# Patient Record
Sex: Male | Born: 1935 | Race: White | Hispanic: No | Marital: Married | Smoking: Never smoker
Health system: Southern US, Community
[De-identification: ages and names within clinical notes are randomized; demographics above are authoritative.]

## PROBLEM LIST (undated history)

## (undated) DIAGNOSIS — I4891 Unspecified atrial fibrillation: Secondary | ICD-10-CM

## (undated) DIAGNOSIS — I639 Cerebral infarction, unspecified: Secondary | ICD-10-CM

## (undated) DIAGNOSIS — E079 Disorder of thyroid, unspecified: Secondary | ICD-10-CM

## (undated) DIAGNOSIS — I10 Essential (primary) hypertension: Secondary | ICD-10-CM

## (undated) DIAGNOSIS — Z4509 Encounter for adjustment and management of other cardiac device: Secondary | ICD-10-CM

## (undated) DIAGNOSIS — Z95818 Presence of other cardiac implants and grafts: Secondary | ICD-10-CM

## (undated) DIAGNOSIS — Z8673 Personal history of transient ischemic attack (TIA), and cerebral infarction without residual deficits: Secondary | ICD-10-CM

## (undated) DIAGNOSIS — E119 Type 2 diabetes mellitus without complications: Secondary | ICD-10-CM

## (undated) DIAGNOSIS — E785 Hyperlipidemia, unspecified: Secondary | ICD-10-CM

## (undated) DIAGNOSIS — E039 Hypothyroidism, unspecified: Secondary | ICD-10-CM

## (undated) DIAGNOSIS — C801 Malignant (primary) neoplasm, unspecified: Secondary | ICD-10-CM

## (undated) HISTORY — PX: LOOP RECORDER IMPLANT: SHX5954

## (undated) HISTORY — DX: Unspecified atrial fibrillation: I48.91

## (undated) HISTORY — PX: BACK SURGERY: SHX140

## (undated) HISTORY — DX: Cerebral infarction, unspecified: I63.9

---
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
## 2002-05-01 ENCOUNTER — Ambulatory Visit: Admission: RE | Admit: 2002-05-01 | Discharge: 2002-05-11 | Payer: Self-pay | Admitting: Radiation Oncology

## 2002-07-04 ENCOUNTER — Ambulatory Visit: Admission: RE | Admit: 2002-07-04 | Discharge: 2002-10-02 | Payer: Self-pay | Admitting: Radiation Oncology

## 2002-11-08 ENCOUNTER — Ambulatory Visit: Admission: RE | Admit: 2002-11-08 | Discharge: 2002-11-08 | Payer: Self-pay | Admitting: Radiation Oncology

## 2011-02-23 DIAGNOSIS — R946 Abnormal results of thyroid function studies: Secondary | ICD-10-CM | POA: Diagnosis not present

## 2011-04-13 DIAGNOSIS — E119 Type 2 diabetes mellitus without complications: Secondary | ICD-10-CM | POA: Diagnosis not present

## 2011-04-13 DIAGNOSIS — E785 Hyperlipidemia, unspecified: Secondary | ICD-10-CM | POA: Diagnosis not present

## 2011-04-13 DIAGNOSIS — E038 Other specified hypothyroidism: Secondary | ICD-10-CM | POA: Diagnosis not present

## 2011-04-13 DIAGNOSIS — I10 Essential (primary) hypertension: Secondary | ICD-10-CM | POA: Diagnosis not present

## 2011-04-13 DIAGNOSIS — E663 Overweight: Secondary | ICD-10-CM | POA: Diagnosis not present

## 2011-04-16 DIAGNOSIS — Z8546 Personal history of malignant neoplasm of prostate: Secondary | ICD-10-CM | POA: Diagnosis not present

## 2011-04-16 DIAGNOSIS — C61 Malignant neoplasm of prostate: Secondary | ICD-10-CM | POA: Diagnosis not present

## 2011-10-19 DIAGNOSIS — E663 Overweight: Secondary | ICD-10-CM | POA: Diagnosis not present

## 2011-10-19 DIAGNOSIS — E038 Other specified hypothyroidism: Secondary | ICD-10-CM | POA: Diagnosis not present

## 2011-10-19 DIAGNOSIS — I10 Essential (primary) hypertension: Secondary | ICD-10-CM | POA: Diagnosis not present

## 2011-10-19 DIAGNOSIS — H9190 Unspecified hearing loss, unspecified ear: Secondary | ICD-10-CM | POA: Diagnosis not present

## 2011-10-19 DIAGNOSIS — E785 Hyperlipidemia, unspecified: Secondary | ICD-10-CM | POA: Diagnosis not present

## 2011-10-19 DIAGNOSIS — E119 Type 2 diabetes mellitus without complications: Secondary | ICD-10-CM | POA: Diagnosis not present

## 2011-12-09 DIAGNOSIS — H5210 Myopia, unspecified eye: Secondary | ICD-10-CM | POA: Diagnosis not present

## 2011-12-09 DIAGNOSIS — H2510 Age-related nuclear cataract, unspecified eye: Secondary | ICD-10-CM | POA: Diagnosis not present

## 2011-12-09 DIAGNOSIS — H43819 Vitreous degeneration, unspecified eye: Secondary | ICD-10-CM | POA: Diagnosis not present

## 2011-12-09 DIAGNOSIS — IMO0001 Reserved for inherently not codable concepts without codable children: Secondary | ICD-10-CM | POA: Diagnosis not present

## 2011-12-22 DIAGNOSIS — H903 Sensorineural hearing loss, bilateral: Secondary | ICD-10-CM | POA: Diagnosis not present

## 2012-01-24 DIAGNOSIS — E785 Hyperlipidemia, unspecified: Secondary | ICD-10-CM | POA: Diagnosis not present

## 2012-01-24 DIAGNOSIS — E119 Type 2 diabetes mellitus without complications: Secondary | ICD-10-CM | POA: Diagnosis not present

## 2012-01-24 DIAGNOSIS — E038 Other specified hypothyroidism: Secondary | ICD-10-CM | POA: Diagnosis not present

## 2012-01-25 DIAGNOSIS — E038 Other specified hypothyroidism: Secondary | ICD-10-CM | POA: Diagnosis not present

## 2012-01-25 DIAGNOSIS — E663 Overweight: Secondary | ICD-10-CM | POA: Diagnosis not present

## 2012-01-25 DIAGNOSIS — E119 Type 2 diabetes mellitus without complications: Secondary | ICD-10-CM | POA: Diagnosis not present

## 2012-01-25 DIAGNOSIS — E785 Hyperlipidemia, unspecified: Secondary | ICD-10-CM | POA: Diagnosis not present

## 2012-01-25 DIAGNOSIS — I10 Essential (primary) hypertension: Secondary | ICD-10-CM | POA: Diagnosis not present

## 2012-05-02 DIAGNOSIS — C61 Malignant neoplasm of prostate: Secondary | ICD-10-CM | POA: Diagnosis not present

## 2012-08-15 DIAGNOSIS — E038 Other specified hypothyroidism: Secondary | ICD-10-CM | POA: Diagnosis not present

## 2012-08-15 DIAGNOSIS — E119 Type 2 diabetes mellitus without complications: Secondary | ICD-10-CM | POA: Diagnosis not present

## 2012-08-15 DIAGNOSIS — E785 Hyperlipidemia, unspecified: Secondary | ICD-10-CM | POA: Diagnosis not present

## 2012-08-15 DIAGNOSIS — I10 Essential (primary) hypertension: Secondary | ICD-10-CM | POA: Diagnosis not present

## 2012-08-15 DIAGNOSIS — E663 Overweight: Secondary | ICD-10-CM | POA: Diagnosis not present

## 2012-12-25 DIAGNOSIS — Z23 Encounter for immunization: Secondary | ICD-10-CM | POA: Diagnosis not present

## 2013-01-02 DIAGNOSIS — H2510 Age-related nuclear cataract, unspecified eye: Secondary | ICD-10-CM | POA: Diagnosis not present

## 2013-01-02 DIAGNOSIS — IMO0001 Reserved for inherently not codable concepts without codable children: Secondary | ICD-10-CM | POA: Diagnosis not present

## 2013-01-02 DIAGNOSIS — H43819 Vitreous degeneration, unspecified eye: Secondary | ICD-10-CM | POA: Diagnosis not present

## 2013-02-27 DIAGNOSIS — R7989 Other specified abnormal findings of blood chemistry: Secondary | ICD-10-CM | POA: Diagnosis not present

## 2013-02-27 DIAGNOSIS — E038 Other specified hypothyroidism: Secondary | ICD-10-CM | POA: Diagnosis not present

## 2013-02-27 DIAGNOSIS — E119 Type 2 diabetes mellitus without complications: Secondary | ICD-10-CM | POA: Diagnosis not present

## 2013-03-01 DIAGNOSIS — E119 Type 2 diabetes mellitus without complications: Secondary | ICD-10-CM | POA: Diagnosis not present

## 2013-03-01 DIAGNOSIS — E663 Overweight: Secondary | ICD-10-CM | POA: Diagnosis not present

## 2013-03-01 DIAGNOSIS — E038 Other specified hypothyroidism: Secondary | ICD-10-CM | POA: Diagnosis not present

## 2013-03-01 DIAGNOSIS — I10 Essential (primary) hypertension: Secondary | ICD-10-CM | POA: Diagnosis not present

## 2013-03-01 DIAGNOSIS — E785 Hyperlipidemia, unspecified: Secondary | ICD-10-CM | POA: Diagnosis not present

## 2013-03-06 DIAGNOSIS — H02839 Dermatochalasis of unspecified eye, unspecified eyelid: Secondary | ICD-10-CM | POA: Diagnosis not present

## 2013-03-06 DIAGNOSIS — H25049 Posterior subcapsular polar age-related cataract, unspecified eye: Secondary | ICD-10-CM | POA: Diagnosis not present

## 2013-03-06 DIAGNOSIS — H35379 Puckering of macula, unspecified eye: Secondary | ICD-10-CM | POA: Diagnosis not present

## 2013-03-06 DIAGNOSIS — H2510 Age-related nuclear cataract, unspecified eye: Secondary | ICD-10-CM | POA: Diagnosis not present

## 2013-03-06 DIAGNOSIS — E119 Type 2 diabetes mellitus without complications: Secondary | ICD-10-CM | POA: Diagnosis not present

## 2013-03-06 DIAGNOSIS — H25019 Cortical age-related cataract, unspecified eye: Secondary | ICD-10-CM | POA: Diagnosis not present

## 2013-04-12 DIAGNOSIS — E038 Other specified hypothyroidism: Secondary | ICD-10-CM | POA: Diagnosis not present

## 2013-05-14 DIAGNOSIS — Z961 Presence of intraocular lens: Secondary | ICD-10-CM | POA: Diagnosis not present

## 2013-05-14 DIAGNOSIS — H2510 Age-related nuclear cataract, unspecified eye: Secondary | ICD-10-CM | POA: Diagnosis not present

## 2013-05-14 DIAGNOSIS — H269 Unspecified cataract: Secondary | ICD-10-CM | POA: Diagnosis not present

## 2013-05-15 DIAGNOSIS — H2510 Age-related nuclear cataract, unspecified eye: Secondary | ICD-10-CM | POA: Diagnosis not present

## 2013-05-28 DIAGNOSIS — H269 Unspecified cataract: Secondary | ICD-10-CM | POA: Diagnosis not present

## 2013-05-28 DIAGNOSIS — H2510 Age-related nuclear cataract, unspecified eye: Secondary | ICD-10-CM | POA: Diagnosis not present

## 2013-05-28 DIAGNOSIS — Z961 Presence of intraocular lens: Secondary | ICD-10-CM | POA: Diagnosis not present

## 2013-06-11 DIAGNOSIS — E038 Other specified hypothyroidism: Secondary | ICD-10-CM | POA: Diagnosis not present

## 2013-08-29 DIAGNOSIS — E038 Other specified hypothyroidism: Secondary | ICD-10-CM | POA: Diagnosis not present

## 2013-08-29 DIAGNOSIS — E119 Type 2 diabetes mellitus without complications: Secondary | ICD-10-CM | POA: Diagnosis not present

## 2013-08-31 DIAGNOSIS — E119 Type 2 diabetes mellitus without complications: Secondary | ICD-10-CM | POA: Diagnosis not present

## 2013-08-31 DIAGNOSIS — Z23 Encounter for immunization: Secondary | ICD-10-CM | POA: Diagnosis not present

## 2013-08-31 DIAGNOSIS — E785 Hyperlipidemia, unspecified: Secondary | ICD-10-CM | POA: Diagnosis not present

## 2013-08-31 DIAGNOSIS — E663 Overweight: Secondary | ICD-10-CM | POA: Diagnosis not present

## 2013-08-31 DIAGNOSIS — Z6826 Body mass index (BMI) 26.0-26.9, adult: Secondary | ICD-10-CM | POA: Diagnosis not present

## 2013-08-31 DIAGNOSIS — E038 Other specified hypothyroidism: Secondary | ICD-10-CM | POA: Diagnosis not present

## 2013-08-31 DIAGNOSIS — I10 Essential (primary) hypertension: Secondary | ICD-10-CM | POA: Diagnosis not present

## 2013-12-06 DIAGNOSIS — Z23 Encounter for immunization: Secondary | ICD-10-CM | POA: Diagnosis not present

## 2013-12-31 DIAGNOSIS — E119 Type 2 diabetes mellitus without complications: Secondary | ICD-10-CM | POA: Diagnosis not present

## 2013-12-31 DIAGNOSIS — Z961 Presence of intraocular lens: Secondary | ICD-10-CM | POA: Diagnosis not present

## 2014-03-06 DIAGNOSIS — Z6826 Body mass index (BMI) 26.0-26.9, adult: Secondary | ICD-10-CM | POA: Diagnosis not present

## 2014-03-06 DIAGNOSIS — E663 Overweight: Secondary | ICD-10-CM | POA: Diagnosis not present

## 2014-03-06 DIAGNOSIS — E1142 Type 2 diabetes mellitus with diabetic polyneuropathy: Secondary | ICD-10-CM | POA: Diagnosis not present

## 2014-03-06 DIAGNOSIS — E039 Hypothyroidism, unspecified: Secondary | ICD-10-CM | POA: Diagnosis not present

## 2014-09-11 DIAGNOSIS — E039 Hypothyroidism, unspecified: Secondary | ICD-10-CM | POA: Diagnosis not present

## 2014-09-11 DIAGNOSIS — E1142 Type 2 diabetes mellitus with diabetic polyneuropathy: Secondary | ICD-10-CM | POA: Diagnosis not present

## 2014-10-23 DIAGNOSIS — E1142 Type 2 diabetes mellitus with diabetic polyneuropathy: Secondary | ICD-10-CM | POA: Diagnosis not present

## 2014-10-23 DIAGNOSIS — E039 Hypothyroidism, unspecified: Secondary | ICD-10-CM | POA: Diagnosis not present

## 2014-12-20 DIAGNOSIS — Z23 Encounter for immunization: Secondary | ICD-10-CM | POA: Diagnosis not present

## 2015-01-02 DIAGNOSIS — E119 Type 2 diabetes mellitus without complications: Secondary | ICD-10-CM | POA: Diagnosis not present

## 2015-01-02 DIAGNOSIS — Z961 Presence of intraocular lens: Secondary | ICD-10-CM | POA: Diagnosis not present

## 2015-01-02 DIAGNOSIS — Z7984 Long term (current) use of oral hypoglycemic drugs: Secondary | ICD-10-CM | POA: Diagnosis not present

## 2015-03-13 DIAGNOSIS — E039 Hypothyroidism, unspecified: Secondary | ICD-10-CM | POA: Diagnosis not present

## 2015-03-13 DIAGNOSIS — Z7984 Long term (current) use of oral hypoglycemic drugs: Secondary | ICD-10-CM | POA: Diagnosis not present

## 2015-03-13 DIAGNOSIS — E785 Hyperlipidemia, unspecified: Secondary | ICD-10-CM | POA: Diagnosis not present

## 2015-03-13 DIAGNOSIS — E1142 Type 2 diabetes mellitus with diabetic polyneuropathy: Secondary | ICD-10-CM | POA: Diagnosis not present

## 2015-03-13 DIAGNOSIS — I10 Essential (primary) hypertension: Secondary | ICD-10-CM | POA: Diagnosis not present

## 2015-09-10 DIAGNOSIS — Z7984 Long term (current) use of oral hypoglycemic drugs: Secondary | ICD-10-CM | POA: Diagnosis not present

## 2015-09-10 DIAGNOSIS — E039 Hypothyroidism, unspecified: Secondary | ICD-10-CM | POA: Diagnosis not present

## 2015-09-10 DIAGNOSIS — E785 Hyperlipidemia, unspecified: Secondary | ICD-10-CM | POA: Diagnosis not present

## 2015-09-10 DIAGNOSIS — I10 Essential (primary) hypertension: Secondary | ICD-10-CM | POA: Diagnosis not present

## 2015-09-10 DIAGNOSIS — E1142 Type 2 diabetes mellitus with diabetic polyneuropathy: Secondary | ICD-10-CM | POA: Diagnosis not present

## 2015-10-02 DIAGNOSIS — Z23 Encounter for immunization: Secondary | ICD-10-CM | POA: Diagnosis not present

## 2016-01-06 DIAGNOSIS — H35373 Puckering of macula, bilateral: Secondary | ICD-10-CM | POA: Diagnosis not present

## 2016-01-06 DIAGNOSIS — E119 Type 2 diabetes mellitus without complications: Secondary | ICD-10-CM | POA: Diagnosis not present

## 2016-03-15 DIAGNOSIS — E039 Hypothyroidism, unspecified: Secondary | ICD-10-CM | POA: Diagnosis not present

## 2016-03-15 DIAGNOSIS — E785 Hyperlipidemia, unspecified: Secondary | ICD-10-CM | POA: Diagnosis not present

## 2016-03-15 DIAGNOSIS — I10 Essential (primary) hypertension: Secondary | ICD-10-CM | POA: Diagnosis not present

## 2016-03-15 DIAGNOSIS — E1142 Type 2 diabetes mellitus with diabetic polyneuropathy: Secondary | ICD-10-CM | POA: Diagnosis not present

## 2016-03-15 DIAGNOSIS — Z7984 Long term (current) use of oral hypoglycemic drugs: Secondary | ICD-10-CM | POA: Diagnosis not present

## 2016-09-13 DIAGNOSIS — E785 Hyperlipidemia, unspecified: Secondary | ICD-10-CM | POA: Diagnosis not present

## 2016-09-13 DIAGNOSIS — I10 Essential (primary) hypertension: Secondary | ICD-10-CM | POA: Diagnosis not present

## 2016-09-13 DIAGNOSIS — E039 Hypothyroidism, unspecified: Secondary | ICD-10-CM | POA: Diagnosis not present

## 2016-09-13 DIAGNOSIS — E1142 Type 2 diabetes mellitus with diabetic polyneuropathy: Secondary | ICD-10-CM | POA: Diagnosis not present

## 2016-09-15 DIAGNOSIS — Z23 Encounter for immunization: Secondary | ICD-10-CM | POA: Diagnosis not present

## 2017-03-17 DIAGNOSIS — E039 Hypothyroidism, unspecified: Secondary | ICD-10-CM | POA: Diagnosis not present

## 2017-03-17 DIAGNOSIS — I10 Essential (primary) hypertension: Secondary | ICD-10-CM | POA: Diagnosis not present

## 2017-03-17 DIAGNOSIS — E1142 Type 2 diabetes mellitus with diabetic polyneuropathy: Secondary | ICD-10-CM | POA: Diagnosis not present

## 2017-03-17 DIAGNOSIS — E785 Hyperlipidemia, unspecified: Secondary | ICD-10-CM | POA: Diagnosis not present

## 2017-03-30 DIAGNOSIS — E119 Type 2 diabetes mellitus without complications: Secondary | ICD-10-CM | POA: Diagnosis not present

## 2017-03-30 DIAGNOSIS — H35373 Puckering of macula, bilateral: Secondary | ICD-10-CM | POA: Diagnosis not present

## 2017-03-30 DIAGNOSIS — H16223 Keratoconjunctivitis sicca, not specified as Sjogren's, bilateral: Secondary | ICD-10-CM | POA: Diagnosis not present

## 2017-05-04 DIAGNOSIS — E039 Hypothyroidism, unspecified: Secondary | ICD-10-CM | POA: Diagnosis not present

## 2017-07-06 DIAGNOSIS — E039 Hypothyroidism, unspecified: Secondary | ICD-10-CM | POA: Diagnosis not present

## 2017-09-15 DIAGNOSIS — E1142 Type 2 diabetes mellitus with diabetic polyneuropathy: Secondary | ICD-10-CM | POA: Diagnosis not present

## 2017-09-15 DIAGNOSIS — E785 Hyperlipidemia, unspecified: Secondary | ICD-10-CM | POA: Diagnosis not present

## 2017-09-15 DIAGNOSIS — E039 Hypothyroidism, unspecified: Secondary | ICD-10-CM | POA: Diagnosis not present

## 2017-09-15 DIAGNOSIS — R5383 Other fatigue: Secondary | ICD-10-CM | POA: Diagnosis not present

## 2017-09-15 DIAGNOSIS — I10 Essential (primary) hypertension: Secondary | ICD-10-CM | POA: Diagnosis not present

## 2017-09-15 DIAGNOSIS — Z5181 Encounter for therapeutic drug level monitoring: Secondary | ICD-10-CM | POA: Diagnosis not present

## 2017-09-15 DIAGNOSIS — Z79899 Other long term (current) drug therapy: Secondary | ICD-10-CM | POA: Diagnosis not present

## 2017-09-15 DIAGNOSIS — D7589 Other specified diseases of blood and blood-forming organs: Secondary | ICD-10-CM | POA: Diagnosis not present

## 2017-09-26 DIAGNOSIS — Z23 Encounter for immunization: Secondary | ICD-10-CM | POA: Diagnosis not present

## 2017-10-31 DIAGNOSIS — D7589 Other specified diseases of blood and blood-forming organs: Secondary | ICD-10-CM | POA: Diagnosis not present

## 2017-10-31 DIAGNOSIS — Z79899 Other long term (current) drug therapy: Secondary | ICD-10-CM | POA: Diagnosis not present

## 2018-03-23 DIAGNOSIS — E1142 Type 2 diabetes mellitus with diabetic polyneuropathy: Secondary | ICD-10-CM | POA: Diagnosis not present

## 2018-03-23 DIAGNOSIS — Z5181 Encounter for therapeutic drug level monitoring: Secondary | ICD-10-CM | POA: Diagnosis not present

## 2018-03-23 DIAGNOSIS — E785 Hyperlipidemia, unspecified: Secondary | ICD-10-CM | POA: Diagnosis not present

## 2018-03-23 DIAGNOSIS — E039 Hypothyroidism, unspecified: Secondary | ICD-10-CM | POA: Diagnosis not present

## 2018-03-23 DIAGNOSIS — I10 Essential (primary) hypertension: Secondary | ICD-10-CM | POA: Diagnosis not present

## 2018-03-23 DIAGNOSIS — E1165 Type 2 diabetes mellitus with hyperglycemia: Secondary | ICD-10-CM | POA: Diagnosis not present

## 2018-04-13 ENCOUNTER — Emergency Department (HOSPITAL_COMMUNITY): Payer: Medicare Other

## 2018-04-13 ENCOUNTER — Encounter (HOSPITAL_COMMUNITY): Payer: Self-pay

## 2018-04-13 ENCOUNTER — Inpatient Hospital Stay (HOSPITAL_COMMUNITY)
Admission: EM | Admit: 2018-04-13 | Discharge: 2018-04-15 | DRG: 065 | Disposition: A | Discharge status: Home Health | Payer: Medicare Other | Attending: Internal Medicine | Admitting: Internal Medicine

## 2018-04-13 DIAGNOSIS — H5509 Other forms of nystagmus: Secondary | ICD-10-CM | POA: Diagnosis not present

## 2018-04-13 DIAGNOSIS — I6300 Cerebral infarction due to thrombosis of unspecified precerebral artery: Secondary | ICD-10-CM

## 2018-04-13 DIAGNOSIS — I639 Cerebral infarction, unspecified: Secondary | ICD-10-CM | POA: Diagnosis not present

## 2018-04-13 DIAGNOSIS — R296 Repeated falls: Secondary | ICD-10-CM | POA: Diagnosis not present

## 2018-04-13 DIAGNOSIS — R4182 Altered mental status, unspecified: Secondary | ICD-10-CM | POA: Diagnosis not present

## 2018-04-13 DIAGNOSIS — E039 Hypothyroidism, unspecified: Secondary | ICD-10-CM | POA: Diagnosis not present

## 2018-04-13 DIAGNOSIS — Z79899 Other long term (current) drug therapy: Secondary | ICD-10-CM | POA: Diagnosis not present

## 2018-04-13 DIAGNOSIS — M109 Gout, unspecified: Secondary | ICD-10-CM | POA: Diagnosis not present

## 2018-04-13 DIAGNOSIS — D649 Anemia, unspecified: Secondary | ICD-10-CM | POA: Diagnosis not present

## 2018-04-13 DIAGNOSIS — R402142 Coma scale, eyes open, spontaneous, at arrival to emergency department: Secondary | ICD-10-CM | POA: Diagnosis present

## 2018-04-13 DIAGNOSIS — I10 Essential (primary) hypertension: Secondary | ICD-10-CM | POA: Diagnosis not present

## 2018-04-13 DIAGNOSIS — Z79890 Hormone replacement therapy (postmenopausal): Secondary | ICD-10-CM | POA: Diagnosis not present

## 2018-04-13 DIAGNOSIS — R29700 NIHSS score 0: Secondary | ICD-10-CM | POA: Diagnosis not present

## 2018-04-13 DIAGNOSIS — I6340 Cerebral infarction due to embolism of unspecified cerebral artery: Secondary | ICD-10-CM

## 2018-04-13 DIAGNOSIS — R000 Tachycardia, unspecified: Secondary | ICD-10-CM | POA: Diagnosis not present

## 2018-04-13 DIAGNOSIS — R402362 Coma scale, best motor response, obeys commands, at arrival to emergency department: Secondary | ICD-10-CM | POA: Diagnosis present

## 2018-04-13 DIAGNOSIS — Z8673 Personal history of transient ischemic attack (TIA), and cerebral infarction without residual deficits: Secondary | ICD-10-CM | POA: Diagnosis present

## 2018-04-13 DIAGNOSIS — R233 Spontaneous ecchymoses: Secondary | ICD-10-CM | POA: Diagnosis not present

## 2018-04-13 DIAGNOSIS — E1151 Type 2 diabetes mellitus with diabetic peripheral angiopathy without gangrene: Secondary | ICD-10-CM | POA: Diagnosis not present

## 2018-04-13 DIAGNOSIS — Z880 Allergy status to penicillin: Secondary | ICD-10-CM

## 2018-04-13 DIAGNOSIS — E119 Type 2 diabetes mellitus without complications: Secondary | ICD-10-CM

## 2018-04-13 DIAGNOSIS — R42 Dizziness and giddiness: Secondary | ICD-10-CM | POA: Diagnosis not present

## 2018-04-13 DIAGNOSIS — K921 Melena: Secondary | ICD-10-CM | POA: Diagnosis present

## 2018-04-13 DIAGNOSIS — R402252 Coma scale, best verbal response, oriented, at arrival to emergency department: Secondary | ICD-10-CM | POA: Diagnosis not present

## 2018-04-13 DIAGNOSIS — I6339 Cerebral infarction due to thrombosis of other cerebral artery: Secondary | ICD-10-CM | POA: Diagnosis not present

## 2018-04-13 HISTORY — DX: Type 2 diabetes mellitus without complications: E11.9

## 2018-04-13 HISTORY — DX: Disorder of thyroid, unspecified: E07.9

## 2018-04-13 HISTORY — DX: Malignant (primary) neoplasm, unspecified: C80.1

## 2018-04-13 HISTORY — DX: Essential (primary) hypertension: I10

## 2018-04-13 LAB — COMPREHENSIVE METABOLIC PANEL
ALT: 22 U/L (ref 0–44)
AST: 29 U/L (ref 15–41)
Albumin: 3.8 g/dL (ref 3.5–5.0)
Alkaline Phosphatase: 43 U/L (ref 38–126)
Anion gap: 8 (ref 5–15)
BUN: 23 mg/dL (ref 8–23)
CO2: 22 mmol/L (ref 22–32)
Calcium: 8.9 mg/dL (ref 8.9–10.3)
Chloride: 107 mmol/L (ref 98–111)
Creatinine, Ser: 0.9 mg/dL (ref 0.61–1.24)
GFR calc Af Amer: >60 mL/min (ref >60)
GFR calc non Af Amer: >60 mL/min (ref >60)
Glucose, Bld: 170 mg/dL — ABNORMAL HIGH (ref 70–99)
Potassium: 3.9 mmol/L (ref 3.5–5.1)
Sodium: 137 mmol/L (ref 135–145)
Total Bilirubin: 0.6 mg/dL (ref 0.3–1.2)
Total Protein: 6.5 g/dL (ref 6.5–8.1)

## 2018-04-13 LAB — CBG MONITORING, ED: Glucose-Capillary: 177 mg/dL — ABNORMAL HIGH (ref 70–99)

## 2018-04-13 LAB — URINALYSIS, ROUTINE W REFLEX MICROSCOPIC
Bilirubin Urine: NEGATIVE
Glucose, UA: 50 mg/dL — AB
Hgb urine dipstick: NEGATIVE
Ketones, ur: NEGATIVE mg/dL
Leukocytes,Ua: NEGATIVE
Nitrite: NEGATIVE
PH: 7 (ref 5.0–8.0)
Protein, ur: NEGATIVE mg/dL
Specific Gravity, Urine: 1.019 (ref 1.005–1.030)

## 2018-04-13 LAB — CBC
HEMATOCRIT: 36.4 % — AB (ref 39.0–52.0)
Hemoglobin: 12.2 g/dL — ABNORMAL LOW (ref 13.0–17.0)
MCH: 31.9 pg (ref 26.0–34.0)
MCHC: 33.5 g/dL (ref 30.0–36.0)
MCV: 95 fL (ref 80.0–100.0)
Platelets: 182 10*3/uL (ref 150–400)
RBC: 3.83 MIL/uL — ABNORMAL LOW (ref 4.22–5.81)
RDW: 12.7 % (ref 11.5–15.5)
WBC: 6.5 10*3/uL (ref 4.0–10.5)
nRBC: 0 % (ref 0.0–0.2)

## 2018-04-13 MED ORDER — SODIUM CHLORIDE 0.9% FLUSH: 3.0000 mL | Freq: Once | INTRAVENOUS | Status: DC

## 2018-04-13 NOTE — ED Notes (Signed)
Bed: DP32 Expected date:  Expected time:  Means of arrival:  Comments: EMS 86yo dizziness x1 week, fall, orthostatic

## 2018-04-13 NOTE — H&P (Signed)
History and Physical    Alan Rubio CBJ:[id number] DOB: [birth year] DOA: 04/13/2018  PCP: [name], MD Patient coming from: Home  Chief Complaint: Dizziness, double vision, ataxia  HPI: Alan Rubio is a 85 y.o. male with medical history significant of type 2 diabetes, hypertension, hypothyroidism presenting to the hospital with a 4-day history of dizziness, double vision, and ataxia.  States he has double vision when looking with both eyes or just his right eye.  When looking with his left eye only he does not have double vision.  States when trying to walk he is falling to his right side.  Wife at bedside is concerned that patient has had 2 recent falls.  He did not hit his head or sustain any injuries from the falls.  His symptoms have been persistent.  Denies any history of prior stroke.  He does not smoke cigarettes.  States he was previously taking a baby aspirin daily but quit taking it 3 weeks ago as it was causing him to have diarrhea and dark stools.  His dark stools resolved after he stopped aspirin.  Denies any melena or hematochezia at present.  States his cholesterol has always been low on labs checked previously.  Review of Systems: As per HPI otherwise 10 point review of systems negative.  Past Medical History:  Diagnosis Date  . Cancer ([place])   . Diabetes mellitus without complication ([place])   . Hypertension   . Thyroid disease     Past Surgical History:  Procedure Laterality Date  . BACK SURGERY       reports that he has never smoked. He has never used smokeless tobacco. He reports current alcohol use. No history on file for drug.  Allergies  Allergen Reactions  . Penicillins Rash    Did it involve swelling of the face/tongue/throat, SOB, or low BP? Yes Did it involve sudden or severe rash/hives, skin peeling, or any reaction on the inside of your mouth or nose? No Did you need to seek medical attention at a hospital or doctor's office? No When did it last  happen? If all above answers are "NO", may proceed with cephalosporin use.       History reviewed. No pertinent family history.  Prior to Admission medications   Medication Sig Start Date End Date Taking? Authorizing Provider  allopurinol (ZYLOPRIM) 300 MG tablet Take 300 mg by mouth daily.   Yes [provider]  atenolol (TENORMIN) 50 MG tablet Take 50 mg by mouth daily. 03/11/18  Yes [provider]  doxazosin (CARDURA) 8 MG tablet Take 8 mg by mouth daily.   Yes [provider]  levothyroxine (SYNTHROID, LEVOTHROID) 75 MCG tablet Take 75 mcg by mouth daily before breakfast.   Yes [provider]  losartan (COZAAR) 25 MG tablet Take 25 mg by mouth daily. 02/09/18  Yes [provider]  pioglitazone (ACTOS) 15 MG tablet Take 15 mg by mouth daily.   Yes [provider]  Polyethyl Glycol-Propyl Glycol (SYSTANE) 0.4-0.3 % SOLN Place 1 drop into both eyes 3 (three) times daily as needed (dry eyes).   Yes [provider]    Physical Exam: Vitals:   04/13/18 2215 04/13/18 2245 04/13/18 2330 04/13/18 2345  BP: (!) 173/68 (!) 188/69 (!) 186/70 (!) 170/60  Pulse: 60 65 63 66  Resp: (!) 24 19 18 20   Temp:      TempSrc:      SpO2: 100% 100% 99% 99%  Weight:  Height:        Physical Exam  Constitutional: He is oriented to person, place, and time. He appears well-developed and well-nourished. No distress.  HENT:  Head: Normocephalic.  Mouth/Throat: Oropharynx is clear and moist.  Eyes: Pupils are equal, round, and reactive to light. EOM are normal.  Horizontal nystagmus when looking to the right  Neck: Neck supple.  Cardiovascular: Normal rate, regular rhythm and intact distal pulses.  Pulmonary/Chest: Effort normal and breath sounds normal. No respiratory distress. He has no wheezes. He has no rales.  Abdominal: Soft. Bowel sounds are normal. He exhibits no distension. There is no abdominal tenderness. There is no  guarding.  Musculoskeletal:        General: No edema.  Neurological: He is alert and oriented to person, place, and time. No cranial nerve deficit.  Speech fluent, tongue midline, no facial droop. Strength 5 out of 5 in bilateral upper and lower extremities. Sensation to light touch intact throughout.  Skin: Skin is warm and dry. He is not diaphoretic.     Labs on Admission: I have personally reviewed following labs and imaging studies  CBC: Recent Labs  Lab 04/13/18 2138  WBC 6.5  HGB 12.2*  HCT 36.4*  MCV 95.0  PLT 967   Basic Metabolic Panel: Recent Labs  Lab 04/13/18 2138  NA 137  K 3.9  CL 107  CO2 22  GLUCOSE 170*  BUN 23  CREATININE 0.90  CALCIUM 8.9   GFR: Estimated Creatinine Clearance: 66.1 mL/min (by C-G formula based on SCr of 0.9 mg/dL). Liver Function Tests: Recent Labs  Lab 04/13/18 2138  AST 29  ALT 22  ALKPHOS 43  BILITOT 0.6  PROT 6.5  ALBUMIN 3.8   No results for input(s): LIPASE, AMYLASE in the last 168 hours. No results for input(s): AMMONIA in the last 168 hours. Coagulation Profile: No results for input(s): INR, PROTIME in the last 168 hours. Cardiac Enzymes: No results for input(s): CKTOTAL, CKMB, CKMBINDEX, TROPONINI in the last 168 hours. BNP (last 3 results) No results for input(s): PROBNP in the last 8760 hours. HbA1C: No results for input(s): HGBA1C in the last 72 hours. CBG: Recent Labs  Lab 04/13/18 2001  GLUCAP 177*   Lipid Profile: No results for input(s): CHOL, HDL, LDLCALC, TRIG, CHOLHDL, LDLDIRECT in the last 72 hours. Thyroid Function Tests: No results for input(s): TSH, T4TOTAL, FREET4, T3FREE, THYROIDAB in the last 72 hours. Anemia Panel: No results for input(s): VITAMINB12, FOLATE, FERRITIN, TIBC, IRON, RETICCTPCT in the last 72 hours. Urine analysis:    Component Value Date/Time   COLORURINE YELLOW 04/13/2018 1954   APPEARANCEUR CLEAR 04/13/2018 1954   LABSPEC 1.019 04/13/2018 [place] 7.0  04/13/2018 1954   GLUCOSEU 50 (A) 04/13/2018 [place] NEGATIVE 04/13/2018 [place] NEGATIVE 04/13/2018 [place] NEGATIVE 04/13/2018 [place] NEGATIVE 04/13/2018 1954   NITRITE NEGATIVE 04/13/2018 1954   LEUKOCYTESUR NEGATIVE 04/13/2018 1954    Radiological Exams on Admission: Ct Head Wo Contrast  Result Date: 04/13/2018 CLINICAL DATA:  Altered LOC EXAM: CT HEAD WITHOUT CONTRAST TECHNIQUE: Contiguous axial images were obtained from the base of the skull through the vertex without intravenous contrast. COMPARISON:  None. FINDINGS: Brain: No acute territorial infarction, hemorrhage or intracranial mass. Moderate atrophy. Moderate small vessel ischemic changes of the white matter. Hypodensities in the left thalamus, consistent with age indeterminate lacunar infarct. Prominent ventricles felt secondary to atrophy Vascular: No hyperdense vessels. Vertebral  and carotid vascular calcification Skull: Normal. Negative for fracture or focal lesion. Sinuses/Orbits: No acute finding. Other: None IMPRESSION: 1. Small hypodensities in the left thalamus suggesting age indeterminate lacunar infarcts. Negative for acute intracranial hemorrhage. 2. Atrophy and small vessel ischemic changes of the white matter. Electronically Signed   By: Donavan Foil M.D.   On: 04/13/2018 21:07    EKG: Pending at this time.  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  Noted to have horizontal nystagmus when looking to the right on exam. -Head CT showing small hypodensities in the left thalamus suggesting age-indeterminate lacunar infarcts.  Negative for acute intracranial hemorrhage. -Discussed with neurology.  Concern for posterior  circulation stroke-pontine or cerebellar.  Recommendation is to get CT angiogram head and neck in addition to brain MRI without contrast.  Allow permissive hypertension and transfer patient [facility].  Neurology will consult. -Brain MRI without contrast -CT angiogram head and neck -Echocardiogram -PT/OT/SLP -A1c, lipid panel -Will hold giving aspirin at this time as patient reported recent history of melena with this medication.  Unclear if he has gastritis; no prior EGD results in the chart. -Risk factor modification -Allow permissive hypertension up to 220/120  Normocytic anemia -Hemoglobin 12.2, no prior baseline.  Patient denies any melena or hematochezia present. -Continue to monitor CBC -Check iron, ferritin, TIBC  Hypertension -Allow permissive hypertension in the setting of suspected posterior circulation stroke  Gout -Continue allopurinol  Type 2 diabetes -Check A1c.  Sliding scale insulin and CBG checks.  Hypothyroidism -Continue Synthroid  DVT prophylaxis: Lovenox Code Status: Patient wishes to be full code. Family Communication: Wife at bedside. Disposition Plan: Anticipate discharge in 1 to 2 days. Consults called: Neurology (Dr. Lorraine Lax) Admission status: Observation, telemetry  This chart was dictated using voice recognition software.  Despite best efforts to proofread, errors can occur which can change the documentation meaning.  Shela Leff MD [facility] Pager [phone]  If 7PM-7AM, please contact night-coverage [email or web address] Password [email or web address]  04/14/2018, 1:28 AM

## 2018-04-13 NOTE — ED Triage Notes (Addendum)
Pt BIB [facility] from home. Pt c/o dizziness x1 week, worsening today. Pt c/o blurred vision x1 week. Pt has fallen x2 today due to the dizziness. Pt experienced increased weakness. Pt denies pain.   EMS Orthostatics: Sitting: 160/78 Standing: 144/82

## 2018-04-13 NOTE — ED Notes (Signed)
Urine culture sent down to lab with urinalysis. 

## 2018-04-13 NOTE — ED Provider Notes (Signed)
[facility] DEPT Provider Note   CSN: [id number] Arrival date & time: 04/13/18  6811    History   Chief Complaint Chief Complaint  Patient presents with  . Dizziness    HPI Alan Rubio is a 85 y.o. male.     85 year old male presents with dizziness which is been intermittent x1 week.  Denies any new medications.  No history of volume loss.  States that he is also had blurred vision that is worse when he closes 1 eye.  Is unsure if he has had diplopia.  No headache.  Patient does endorse ataxia mostly with leaning towards the right side.  He denies any vertiginous symptoms.  States dizziness sometimes does get worse when he stands up.  Denies any associated chest pain or shortness of breath.  No injury from his falls.  Called EMS and was transported here     Past Medical History:  Diagnosis Date  . Cancer ([place])   . Diabetes mellitus without complication ([place])   . Hypertension   . Thyroid disease     There are no active problems to display for this patient.         Home Medications    Prior to Admission medications   Not on File    Family History No family history on file.  Social History Social History   Tobacco Use  . Smoking status: Never Smoker  . Smokeless tobacco: Never Used  Substance Use Topics  . Alcohol use: Yes    Comment: occasional   . Drug use: Not on file     Allergies   Penicillins   Review of Systems Review of Systems  All other systems reviewed and are negative.    Physical Exam Updated Vital Signs BP (!) 192/76 (BP Location: Right Arm) Comment: rn notified  Pulse 63   Temp 98.2 F (36.8 C) (Oral)   Resp 19   Ht 1.816 m (5' 11.5")   Wt 78 kg   SpO2 100%   BMI 23.65 kg/m   Physical Exam Vitals signs and nursing note reviewed.  Constitutional:      General: He is not in acute distress.    Appearance: Normal appearance. He is well-developed. He is not toxic-appearing.  HENT:     Head:  Normocephalic and atraumatic.  Eyes:     General: Lids are normal.     Conjunctiva/sclera: Conjunctivae normal.     Pupils: Pupils are equal, round, and reactive to light.  Neck:     Musculoskeletal: Normal range of motion and neck supple.     Thyroid: No thyroid mass.     Trachea: No tracheal deviation.  Cardiovascular:     Rate and Rhythm: Normal rate and regular rhythm.     Heart sounds: Normal heart sounds. No murmur. No gallop.   Pulmonary:     Effort: Pulmonary effort is normal. No respiratory distress.     Breath sounds: Normal breath sounds. No stridor. No decreased breath sounds, wheezing, rhonchi or rales.  Abdominal:     General: Bowel sounds are normal. There is no distension.     Palpations: Abdomen is soft.     Tenderness: There is no abdominal tenderness. There is no rebound.  Musculoskeletal: Normal range of motion.        General: No tenderness.  Skin:    General: Skin is warm and dry.     Findings: No abrasion or rash.  Neurological:     Mental Status:  He is alert and oriented to person, place, and time.     GCS: GCS eye subscore is 4. GCS verbal subscore is 5. GCS motor subscore is 6.     Cranial Nerves: No cranial nerve deficit or facial asymmetry.     Sensory: No sensory deficit.     Motor: No pronator drift.     Coordination: Finger-Nose-Finger Test normal.     Comments: Strength is 5/5 in all 4 extremities.  Psychiatric:        Speech: Speech normal.        Behavior: Behavior normal.      ED Treatments / Results  Labs (all labs ordered are listed, but only abnormal results are displayed) Labs Reviewed  CBG MONITORING, ED - Abnormal; Notable for the following components:      Result Value   Glucose-Capillary 177 (*)    All other components within normal limits  CBC  URINALYSIS, ROUTINE W REFLEX MICROSCOPIC  COMPREHENSIVE METABOLIC PANEL    EKG EKG Interpretation  Date/Time:  Thursday April 13 2018 20:09:34 EDT Ventricular Rate:  66 PR  Interval:    QRS Duration: 98 QT Interval:  396 QTC Calculation: 415 R Axis:   24 Text Interpretation:  Sinus rhythm Prolonged PR interval No old tracing to compare Confirmed by Lacretia Leigh ([id number]) on 04/13/2018 8:21:45 PM   Radiology No results found.  Procedures Procedures (including critical care time)  Medications Ordered in ED Medications  sodium chloride flush (NS) 0.9 % injection 3 mL (has no administration in time range)     Initial Impression / Assessment and Plan / ED Course  I have reviewed the triage vital signs and the nursing notes.  Pertinent labs & imaging results that were available during my care of the patient were reviewed by me and considered in my medical decision making (see chart for details).       Patient reports ataxia with walking and blurred vision that is worse with both eyes open and better with one eye closed.  CT of the head noted and without evidence of hemorrhage but does show some lacunar infarcts that are age-indeterminate.  Will admit patient for stroke work-up  Final Clinical Impressions(s) / ED Diagnoses   Final diagnoses:  None    ED Discharge Orders    None       Lacretia Leigh, MD 04/13/18 2249

## 2018-04-13 NOTE — ED Notes (Signed)
During orthostatics, pt c/o of blurry vision, denied dizziness.

## 2018-04-14 ENCOUNTER — Other Ambulatory Visit: Payer: Self-pay

## 2018-04-14 ENCOUNTER — Observation Stay (HOSPITAL_BASED_OUTPATIENT_CLINIC_OR_DEPARTMENT_OTHER): Payer: Medicare Other

## 2018-04-14 ENCOUNTER — Observation Stay (HOSPITAL_COMMUNITY): Payer: Medicare Other

## 2018-04-14 ENCOUNTER — Encounter (HOSPITAL_COMMUNITY): Payer: Self-pay | Admitting: Internal Medicine

## 2018-04-14 DIAGNOSIS — R42 Dizziness and giddiness: Secondary | ICD-10-CM | POA: Diagnosis present

## 2018-04-14 DIAGNOSIS — I6503 Occlusion and stenosis of bilateral vertebral arteries: Secondary | ICD-10-CM | POA: Diagnosis not present

## 2018-04-14 DIAGNOSIS — H5509 Other forms of nystagmus: Secondary | ICD-10-CM | POA: Diagnosis present

## 2018-04-14 DIAGNOSIS — E1151 Type 2 diabetes mellitus with diabetic peripheral angiopathy without gangrene: Secondary | ICD-10-CM | POA: Diagnosis present

## 2018-04-14 DIAGNOSIS — M109 Gout, unspecified: Secondary | ICD-10-CM | POA: Diagnosis not present

## 2018-04-14 DIAGNOSIS — R29700 NIHSS score 0: Secondary | ICD-10-CM | POA: Diagnosis present

## 2018-04-14 DIAGNOSIS — E039 Hypothyroidism, unspecified: Secondary | ICD-10-CM | POA: Diagnosis present

## 2018-04-14 DIAGNOSIS — D649 Anemia, unspecified: Secondary | ICD-10-CM | POA: Diagnosis not present

## 2018-04-14 DIAGNOSIS — K921 Melena: Secondary | ICD-10-CM | POA: Diagnosis present

## 2018-04-14 DIAGNOSIS — E119 Type 2 diabetes mellitus without complications: Secondary | ICD-10-CM

## 2018-04-14 DIAGNOSIS — I6300 Cerebral infarction due to thrombosis of unspecified precerebral artery: Secondary | ICD-10-CM

## 2018-04-14 DIAGNOSIS — Z79899 Other long term (current) drug therapy: Secondary | ICD-10-CM | POA: Diagnosis not present

## 2018-04-14 DIAGNOSIS — I6340 Cerebral infarction due to embolism of unspecified cerebral artery: Secondary | ICD-10-CM

## 2018-04-14 DIAGNOSIS — I639 Cerebral infarction, unspecified: Secondary | ICD-10-CM

## 2018-04-14 DIAGNOSIS — Z8673 Personal history of transient ischemic attack (TIA), and cerebral infarction without residual deficits: Secondary | ICD-10-CM

## 2018-04-14 DIAGNOSIS — R402252 Coma scale, best verbal response, oriented, at arrival to emergency department: Secondary | ICD-10-CM | POA: Diagnosis present

## 2018-04-14 DIAGNOSIS — Z79890 Hormone replacement therapy (postmenopausal): Secondary | ICD-10-CM | POA: Diagnosis not present

## 2018-04-14 DIAGNOSIS — R296 Repeated falls: Secondary | ICD-10-CM | POA: Diagnosis present

## 2018-04-14 DIAGNOSIS — R402142 Coma scale, eyes open, spontaneous, at arrival to emergency department: Secondary | ICD-10-CM | POA: Diagnosis present

## 2018-04-14 DIAGNOSIS — R402362 Coma scale, best motor response, obeys commands, at arrival to emergency department: Secondary | ICD-10-CM | POA: Diagnosis present

## 2018-04-14 DIAGNOSIS — I10 Essential (primary) hypertension: Secondary | ICD-10-CM

## 2018-04-14 DIAGNOSIS — R233 Spontaneous ecchymoses: Secondary | ICD-10-CM | POA: Diagnosis present

## 2018-04-14 DIAGNOSIS — Z880 Allergy status to penicillin: Secondary | ICD-10-CM | POA: Diagnosis not present

## 2018-04-14 DIAGNOSIS — E1169 Type 2 diabetes mellitus with other specified complication: Secondary | ICD-10-CM | POA: Diagnosis not present

## 2018-04-14 HISTORY — DX: Personal history of transient ischemic attack (TIA), and cerebral infarction without residual deficits: Z86.73

## 2018-04-14 LAB — RETICULOCYTES
Immature Retic Fract: 4.2 % (ref 2.3–15.9)
RBC.: 4.27 MIL/uL (ref 4.22–5.81)
Retic Count, Absolute: 49.5 10*3/uL (ref 19.0–186.0)
Retic Ct Pct: 1.2 % (ref 0.4–3.1)

## 2018-04-14 LAB — IRON AND TIBC
IRON: 79 ug/dL (ref 45–182)
Saturation Ratios: 25 % (ref 17.9–39.5)
TIBC: 316 ug/dL (ref 250–450)
UIBC: 237 ug/dL

## 2018-04-14 LAB — LIPID PANEL
Cholesterol: 143 mg/dL (ref 0–200)
HDL: 39 mg/dL — AB (ref >40)
LDL Cholesterol: 84 mg/dL (ref 0–99)
Total CHOL/HDL Ratio: 3.7 RATIO
Triglycerides: 98 mg/dL (ref <150)
VLDL: 20 mg/dL (ref 0–40)

## 2018-04-14 LAB — GLUCOSE, CAPILLARY
GLUCOSE-CAPILLARY: 180 mg/dL — AB (ref 70–99)
Glucose-Capillary: 155 mg/dL — ABNORMAL HIGH (ref 70–99)
Glucose-Capillary: 129 mg/dL — ABNORMAL HIGH (ref 70–99)
Glucose-Capillary: 142 mg/dL — ABNORMAL HIGH (ref 70–99)
Glucose-Capillary: 114 mg/dL — ABNORMAL HIGH (ref 70–99)

## 2018-04-14 LAB — ECHOCARDIOGRAM COMPLETE BUBBLE STUDY
Height: 71 in
Weight: 2821.89 oz

## 2018-04-14 LAB — HEMOGLOBIN A1C
Hgb A1c MFr Bld: 7.9 % — ABNORMAL HIGH (ref 4.8–5.6)
Mean Plasma Glucose: 180.03 mg/dL

## 2018-04-14 LAB — FOLATE: FOLATE: 36.7 ng/mL (ref >5.9)

## 2018-04-14 LAB — OCCULT BLOOD X 1 CARD TO LAB, STOOL: Fecal Occult Bld: NEGATIVE

## 2018-04-14 LAB — FERRITIN: Ferritin: 271 ng/mL (ref 24–336)

## 2018-04-14 LAB — VITAMIN B12: Vitamin B-12: 337 pg/mL (ref 180–914)

## 2018-04-14 MED ORDER — CLOPIDOGREL BISULFATE 75 MG PO TABS
75.0000 mg | ORAL_TABLET | Freq: Every day | ORAL | Status: DC
  Administered 2018-04-15: 75 mg via ORAL
  Filled 2018-04-14: qty 1

## 2018-04-14 MED ORDER — POLYVINYL ALCOHOL 1.4 % OP SOLN: 1.0000 [drp] | Freq: Three times a day (TID) | OPHTHALMIC | Status: DC | PRN

## 2018-04-14 MED ORDER — LEVOTHYROXINE SODIUM 75 MCG PO TABS
75.0000 ug | ORAL_TABLET | Freq: Every day | ORAL | Status: DC
  Administered 2018-04-14 – 2018-04-15 (×2): 75 ug via ORAL
  Filled 2018-04-14 (×2): qty 1

## 2018-04-14 MED ORDER — IOHEXOL 350 MG/ML SOLN
80.0000 mL | Freq: Once | INTRAVENOUS | Status: AC | PRN
  Administered 2018-04-14: 80 mL via INTRAVENOUS

## 2018-04-14 MED ORDER — STROKE: EARLY STAGES OF RECOVERY BOOK
Freq: Once | Status: AC
  Administered 2018-04-14: 10:00:00

## 2018-04-14 MED ORDER — ACETAMINOPHEN 160 MG/5ML PO SOLN: 650.0000 mg | ORAL | Status: DC | PRN

## 2018-04-14 MED ORDER — SODIUM CHLORIDE (PF) 0.9 % IJ SOLN
INTRAMUSCULAR | Status: AC
  Filled 2018-04-14: qty 50

## 2018-04-14 MED ORDER — ALLOPURINOL 300 MG PO TABS
300.0000 mg | ORAL_TABLET | Freq: Every day | ORAL | Status: DC
  Administered 2018-04-14 – 2018-04-15 (×2): 300 mg via ORAL
  Filled 2018-04-14: qty 3
  Filled 2018-04-14: qty 1
  Filled 2018-04-14: qty 3
  Filled 2018-04-14: qty 1

## 2018-04-14 MED ORDER — ACETAMINOPHEN 650 MG RE SUPP: 650.0000 mg | RECTAL | Status: DC | PRN

## 2018-04-14 MED ORDER — ENOXAPARIN SODIUM 40 MG/0.4ML ~~LOC~~ SOLN
40.0000 mg | SUBCUTANEOUS | Status: DC
  Administered 2018-04-14 – 2018-04-15 (×2): 40 mg via SUBCUTANEOUS
  Filled 2018-04-14 (×2): qty 0.4

## 2018-04-14 MED ORDER — INSULIN ASPART 100 UNIT/ML ~~LOC~~ SOLN
0.0000 [IU] | Freq: Three times a day (TID) | SUBCUTANEOUS | Status: DC
  Administered 2018-04-14 (×2): 2 [IU] via SUBCUTANEOUS
  Administered 2018-04-14 – 2018-04-15 (×3): 1 [IU] via SUBCUTANEOUS

## 2018-04-14 MED ORDER — POLYETHYL GLYCOL-PROPYL GLYCOL 0.4-0.3 % OP SOLN: 1.0000 [drp] | Freq: Three times a day (TID) | OPHTHALMIC | Status: DC | PRN

## 2018-04-14 MED ORDER — ACETAMINOPHEN 325 MG PO TABS: 650.0000 mg | ORAL_TABLET | ORAL | Status: DC | PRN

## 2018-04-14 MED ORDER — ATORVASTATIN CALCIUM 40 MG PO TABS
40.0000 mg | ORAL_TABLET | Freq: Every day | ORAL | Status: DC
  Administered 2018-04-14: 40 mg via ORAL
  Filled 2018-04-14: qty 1

## 2018-04-14 MED ORDER — ASPIRIN EC 81 MG PO TBEC
81.0000 mg | DELAYED_RELEASE_TABLET | Freq: Every day | ORAL | Status: DC
  Administered 2018-04-14 – 2018-04-15 (×2): 81 mg via ORAL
  Filled 2018-04-14 (×2): qty 1

## 2018-04-14 NOTE — Consult Note (Signed)
Requesting Physician: Dr. Marlowe Sax    Chief Complaint: Multiple falls, leaning to right and double vision  History obtained from: Patient and Chart    HPI:                                                                                                                                       Alan Rubio is a 85 y.o. male  with past medical history of of type 2 diabetes mellitus, hypertension, hypothyroidism, melena presented to [facility] long emergency room after having multiple falls. Patient was last known normal approximately 4 days ago when he noticed he developed sudden onset double vision, as well as was listing to the right.  He has had multiple falls and decided to come to the emergency room for further evaluation.  Blood pressure was in the 161 systolic.  CT head was performed which showed age-indeterminate lacunar infarcts.  Patient was admitted for stroke work-up.   Date last known well: approx 3-4 days ago tPA Given:  NIHSS:  Baseline MRS     Past Medical History:  Diagnosis Date  . Cancer ([place])   . Diabetes mellitus without complication ([place])   . Hypertension   . Thyroid disease     Past Surgical History:  Procedure Laterality Date  . BACK SURGERY      History reviewed. No pertinent family history. Social History:  reports that he has never smoked. He has never used smokeless tobacco. He reports current alcohol use. No history on file for drug.  Allergies:  Allergies  Allergen Reactions  . Penicillins Rash    Did it involve swelling of the face/tongue/throat, SOB, or low BP? Yes Did it involve sudden or severe rash/hives, skin peeling, or any reaction on the inside of your mouth or nose? No Did you need to seek medical attention at a hospital or doctor's office? No When did it last happen? If all above answers are "NO", may proceed with cephalosporin use.       Medications:                                                                                                                         I reviewed home medications. No longer taking ASA due to melana   ROS:  14 systems reviewed and negative except above    Examination:                                                                                                      General: Appears well-developed . Psych: Affect appropriate to situation Eyes: No scleral injection HENT: No OP obstrucion Head: Normocephalic.  Cardiovascular: Normal rate and regular rhythm.  Respiratory: Effort normal and breath sounds normal to anterior ascultation GI: Soft.  No distension. There is no tenderness.  Skin: WDI    Neurological Examination Mental Status: Alert, oriented, thought content appropriate.  Speech fluent without evidence of aphasia. Able to follow 3 step commands without difficulty. Cranial Nerves: II: Visual fields grossly normal,  III,IV, VI: ptosis not present, extra-ocular motions intact bilaterally, horizontal nystagmus present at rest, cover/uncover test positive,  pupils equal, round, reactive to light and accommodation V,VII: smile symmetric, facial light touch sensation normal bilaterally VIII: hearing normal bilaterally IX,X: uvula rises symmetrically XI: bilateral shoulder shrug XII: midline tongue extension Motor: Right : Upper extremity   5/5    Left:     Upper extremity   5/5  Lower extremity   5/5     Lower extremity   5/5 Tone and bulk:normal tone throughout; no atrophy noted Sensory: Pinprick and light touch intact throughout, bilaterally Deep Tendon Reflexes: 2+ and symmetric throughout Plantars: Right: downgoing   Left: downgoing Cerebellar: normal finger-to-nose and heel to shin impaired on the right side  Gait: not assessed due to safety     Lab Results: Basic Metabolic Panel: Recent Labs  Lab 04/13/18 2138  NA 137  K 3.9  CL  107  CO2 22  GLUCOSE 170*  BUN 23  CREATININE 0.90  CALCIUM 8.9    CBC: Recent Labs  Lab 04/13/18 2138  WBC 6.5  HGB 12.2*  HCT 36.4*  MCV 95.0  PLT 182    Coagulation Studies: No results for input(s): LABPROT, INR in the last 72 hours.  Imaging: Ct Angio Head W Or Wo Contrast  Result Date: 04/14/2018 CLINICAL DATA:  Initial evaluation for blurry vision, recent falls. EXAM: CT ANGIOGRAPHY HEAD AND NECK TECHNIQUE: Multidetector CT imaging of the head and neck was performed using the standard protocol during bolus administration of intravenous contrast. Multiplanar CT image reconstructions and MIPs were obtained to evaluate the vascular anatomy. Carotid stenosis measurements (when applicable) are obtained utilizing NASCET criteria, using the distal internal carotid diameter as the denominator. CONTRAST:  66mL OMNIPAQUE IOHEXOL 350 MG/ML SOLN COMPARISON:  Prior head CT from 04/13/2018 FINDINGS: CTA NECK FINDINGS Aortic arch: Partially visualized aortic arch of normal caliber with normal branch pattern. Moderate atherosclerotic change about the aortic arch and origin of the great vessels. No definite flow-limiting stenosis, although origin of the great vessels incompletely visualized. Visualized subclavian arteries widely patent. Right carotid system: Right common carotid artery patent from its origin to the bifurcation. Concentric calcified plaque about the right bifurcation/proximal right ICA with relatively mild 25 narrowing by NASCET criteria. Right ICA widely patent distally to the skull base without stenosis or  other vascular abnormality. Left carotid system: Visualized left common carotid artery patent from its origin to the bifurcation without stenosis. Scattered calcified plaque about the left bifurcation/proximal left ICA with associated stenosis of up to 50% by NASCET criteria. Left ICA mildly tortuous but otherwise widely patent to the skull base without stenosis or other vascular  abnormality. Vertebral arteries: Both of the vertebral arteries arise from the subclavian arteries. Mixed plaque at the origin of the right vertebral artery with associated severe stenosis (series 7, image 299). Vertebral arteries otherwise patent within the neck without stenosis, dissection, or occlusion. Left vertebral artery dominant. Skeleton: No acute osseous finding. Moderate multilevel cervical spondylolysis. No discrete osseous lesions. Patient is edentulous. Other neck: Thyroid heterogeneous with multifocal predominantly subcentimeter nodules, of doubtful significance. No other acute soft tissue abnormality within the neck. Upper chest: 7 mm mildly spiculated nodule at the right lung apex (series 5, image 19). Review of the MIP images confirms the above findings CTA HEAD FINDINGS Anterior circulation: Petrous segments widely patent. Diffuse atheromatous plaque throughout the cavernous/supraclinoid ICAs with resultant mild to moderate diffuse narrowing (no more than 50%). ICA termini well perfused. A1 segments patent bilaterally. Normal anterior communicating artery. Anterior cerebral arteries widely patent to their distal aspects. M1 segments widely patent. Distal MCA branches well perfused and symmetric. Posterior circulation: Both vertebral arteries widely patent as it crosses into the cranial vault. Left vertebral artery dominant. Atheromatous plaque with resultant moderate multifocal stenoses seen involving the dominant left V4 segment. On the right, scattered atheromatous plaque within the distal right V4 segment results in moderate to severe multifocal segmental stenoses (series 7, image 138). Posterior inferior cerebral arteries patent bilaterally. Basilar widely patent to its distal aspect. Superior cerebral arteries patent bilaterally. Both of the posterior cerebral arteries primarily supplied via the basilar. Right PCA widely patent to its distal aspect. Focal severe left P1 stenosis (series 8,  image 103). Additional moderate to severe tandem stenoses seen involving the mid-distal left P2 segment. Left PCA is perfused to its distal aspect. Venous sinuses: Patent. Anatomic variants: None significant. Delayed phase: Small focus of patchy enhancement seen involving the cortical gray matter and underlying subcortical white matter at the anterior right frontal lobe (series 13, image 25, 24), indeterminate. No definite discrete mass within this region. Review of the MIP images confirms the above findings IMPRESSION: 1. Negative CTA for large vessel occlusion. 2. Multifocal moderate to severe left P1 and P2 stenoses without vascular occlusion. 3. Severe atheromatous stenosis at the origin of the right vertebral artery, with additional moderate to severe bilateral V4 stenoses as above, right worse than left. Atheromatous stenoses about the carotid bifurcations with up to 50% narrowing on the left and mild 25% narrowing on the right. 4. Moderate atherosclerotic change throughout the carotid siphons with associated moderate diffuse narrowing. 5. Small focus of patchy enhancement involving the anterior right frontal cortex as above, indeterminate, but could reflect the sequelae of subacute ischemia. Correlation with brain MRI recommended. Electronically Signed   By: Jeannine Boga M.D.   On: 04/14/2018 01:58   Ct Head Wo Contrast  Result Date: 04/13/2018 CLINICAL DATA:  Altered LOC EXAM: CT HEAD WITHOUT CONTRAST TECHNIQUE: Contiguous axial images were obtained from the base of the skull through the vertex without intravenous contrast. COMPARISON:  None. FINDINGS: Brain: No acute territorial infarction, hemorrhage or intracranial mass. Moderate atrophy. Moderate small vessel ischemic changes of the white matter. Hypodensities in the left thalamus, consistent with age indeterminate lacunar infarct. Prominent ventricles  felt secondary to atrophy Vascular: No hyperdense vessels. Vertebral and carotid vascular  calcification Skull: Normal. Negative for fracture or focal lesion. Sinuses/Orbits: No acute finding. Other: None IMPRESSION: 1. Small hypodensities in the left thalamus suggesting age indeterminate lacunar infarcts. Negative for acute intracranial hemorrhage. 2. Atrophy and small vessel ischemic changes of the white matter. Electronically Signed   By: Donavan Foil M.D.   On: 04/13/2018 21:07   Ct Angio Neck W Or Wo Contrast  Result Date: 04/14/2018 CLINICAL DATA:  Initial evaluation for blurry vision, recent falls. EXAM: CT ANGIOGRAPHY HEAD AND NECK TECHNIQUE: Multidetector CT imaging of the head and neck was performed using the standard protocol during bolus administration of intravenous contrast. Multiplanar CT image reconstructions and MIPs were obtained to evaluate the vascular anatomy. Carotid stenosis measurements (when applicable) are obtained utilizing NASCET criteria, using the distal internal carotid diameter as the denominator. CONTRAST:  65mL OMNIPAQUE IOHEXOL 350 MG/ML SOLN COMPARISON:  Prior head CT from 04/13/2018 FINDINGS: CTA NECK FINDINGS Aortic arch: Partially visualized aortic arch of normal caliber with normal branch pattern. Moderate atherosclerotic change about the aortic arch and origin of the great vessels. No definite flow-limiting stenosis, although origin of the great vessels incompletely visualized. Visualized subclavian arteries widely patent. Right carotid system: Right common carotid artery patent from its origin to the bifurcation. Concentric calcified plaque about the right bifurcation/proximal right ICA with relatively mild 25 narrowing by NASCET criteria. Right ICA widely patent distally to the skull base without stenosis or other vascular abnormality. Left carotid system: Visualized left common carotid artery patent from its origin to the bifurcation without stenosis. Scattered calcified plaque about the left bifurcation/proximal left ICA with associated stenosis of up to  50% by NASCET criteria. Left ICA mildly tortuous but otherwise widely patent to the skull base without stenosis or other vascular abnormality. Vertebral arteries: Both of the vertebral arteries arise from the subclavian arteries. Mixed plaque at the origin of the right vertebral artery with associated severe stenosis (series 7, image 299). Vertebral arteries otherwise patent within the neck without stenosis, dissection, or occlusion. Left vertebral artery dominant. Skeleton: No acute osseous finding. Moderate multilevel cervical spondylolysis. No discrete osseous lesions. Patient is edentulous. Other neck: Thyroid heterogeneous with multifocal predominantly subcentimeter nodules, of doubtful significance. No other acute soft tissue abnormality within the neck. Upper chest: 7 mm mildly spiculated nodule at the right lung apex (series 5, image 19). Review of the MIP images confirms the above findings CTA HEAD FINDINGS Anterior circulation: Petrous segments widely patent. Diffuse atheromatous plaque throughout the cavernous/supraclinoid ICAs with resultant mild to moderate diffuse narrowing (no more than 50%). ICA termini well perfused. A1 segments patent bilaterally. Normal anterior communicating artery. Anterior cerebral arteries widely patent to their distal aspects. M1 segments widely patent. Distal MCA branches well perfused and symmetric. Posterior circulation: Both vertebral arteries widely patent as it crosses into the cranial vault. Left vertebral artery dominant. Atheromatous plaque with resultant moderate multifocal stenoses seen involving the dominant left V4 segment. On the right, scattered atheromatous plaque within the distal right V4 segment results in moderate to severe multifocal segmental stenoses (series 7, image 138). Posterior inferior cerebral arteries patent bilaterally. Basilar widely patent to its distal aspect. Superior cerebral arteries patent bilaterally. Both of the posterior cerebral  arteries primarily supplied via the basilar. Right PCA widely patent to its distal aspect. Focal severe left P1 stenosis (series 8, image 103). Additional moderate to severe tandem stenoses seen involving the mid-distal left P2 segment. Left  PCA is perfused to its distal aspect. Venous sinuses: Patent. Anatomic variants: None significant. Delayed phase: Small focus of patchy enhancement seen involving the cortical gray matter and underlying subcortical white matter at the anterior right frontal lobe (series 13, image 25, 24), indeterminate. No definite discrete mass within this region. Review of the MIP images confirms the above findings IMPRESSION: 1. Negative CTA for large vessel occlusion. 2. Multifocal moderate to severe left P1 and P2 stenoses without vascular occlusion. 3. Severe atheromatous stenosis at the origin of the right vertebral artery, with additional moderate to severe bilateral V4 stenoses as above, right worse than left. Atheromatous stenoses about the carotid bifurcations with up to 50% narrowing on the left and mild 25% narrowing on the right. 4. Moderate atherosclerotic change throughout the carotid siphons with associated moderate diffuse narrowing. 5. Small focus of patchy enhancement involving the anterior right frontal cortex as above, indeterminate, but could reflect the sequelae of subacute ischemia. Correlation with brain MRI recommended. Electronically Signed   By: Jeannine Boga M.D.   On: 04/14/2018 01:58   Mr Brain Wo Contrast  Result Date: 04/14/2018 CLINICAL DATA:  Initial evaluation for intermittent dizziness for 1 week, ataxia, blurry vision. Evaluate for stroke. EXAM: MRI HEAD WITHOUT CONTRAST TECHNIQUE: Multiplanar, multiecho pulse sequences of the brain and surrounding structures were obtained without intravenous contrast. COMPARISON:  Prior CT and CTA from earlier the same day as well as 04/13/2018. FINDINGS: Brain: Diffuse prominence of the CSF containing spaces  compatible with generalized age-related cerebral atrophy. Patchy and confluent T2/FLAIR hyperintensity within the periventricular deep white matter both cerebral hemispheres most consistent with chronic small vessel ischemic disease, moderate nature. Superimposed remote lacunar infarcts present within the bilateral thalami, left greater than right. Additional remote lacunar infarcts present at the bilateral basal ganglia. 11 mm focus of restricted diffusion at the medial left thalamus, compatible with acute ischemic infarct. No associated hemorrhage or mass effect. Slight extension into the left cerebral peduncle/midbrain. Few additional punctate foci of diffusion abnormality seen involving the subcortical right parietal lobe (series 5, image 82) and left occipital lobe (series 5, image 63), also consistent with acute to early subacute ischemic infarcts. Additional late subacute ischemic infarcts seen involving the anterior right frontal cortical gray matter and underlying white matter, corresponding with patchy enhancement seen on prior CTA (series 5, image 80, 78). Associated mild petechial in hemorrhage at this site (series 14, image 43). No other evidence for acute or subacute ischemia. No other acute or chronic intracranial hemorrhage. No mass lesion, midline shift or mass effect. No hydrocephalus. No extra-axial fluid collection. Pituitary gland suprasellar region normal. Midline structures intact. Vascular: Major intravascular flow voids maintained. Skull and upper cervical spine: Craniocervical junction within normal limits. Visualized upper cervical spine normal. Bone marrow signal intensity within normal limits. No scalp soft tissue abnormality. Sinuses/Orbits: Patient status post bilateral ocular lens replacement. Axial myopia noted. Mucosal thickening with few small retention cyst noted within the maxillary sinuses. Paranasal sinuses are otherwise clear. Small bilateral mastoid effusions, likely  benign/sterile. Inner ear structures grossly normal. Other: None. IMPRESSION: 1. 11 mm acute ischemic nonhemorrhagic left thalamic infarct, with slight inferior extension into the left cerebral peduncle/midbrain. 2. Few additional scattered acute to subacute infarcts involving the right frontal and parietal lobes as well as the left occipital lobe as above. There is associated petechial hemorrhage about the subacute right frontal infarct, which corresponds with patchy enhancement seen on prior CTA. 3. Underlying atrophy with moderate chronic microvascular ischemic disease.  Electronically Signed   By: Jeannine Boga M.D.   On: 04/14/2018 04:44     ASSESSMENT AND PLAN   85 year old male with past medical history of of type 2 diabetes mellitus, hypertension, hypothyroidism, melena presents with 4-day history of double vision, gait imbalance and multiple falls.  Patient has dysmetria in the right upper extremity, horizontal nystagmus.  MRI brain was obtained that shows multiple acute and subacute small infarcts in bilateral hemispheres suggestive of embolic infarcts.  Patient does have an acute infarct in the thalamus and midbrain which most likely explains patient's current presentation of diplopia and gait imbalance.  He does have petechial hemorrhagic conversion of right frontal lobe infarct.   Acute Ischemic Stroke   Risk factors :  type 2 diabetes mellitus, hypertension, hypothyroidism, Etiology: likely cardioembolic   Recommend #CTA head and neck  #Transthoracic Echo  #GI consult to evaluate melena, will need to be placed on antiplatelet and even possibly anticoagulation If he has cardioembolic source for stroke #Start or continue Atorvastatin 40 mg/other high intensity statin # BP goal: permissive HTN upto 220/120 mmHg, gradually  bring to normotension # HBAIC and Lipid profile # Telemetry monitoring # Frequent neuro checks #  stroke swallow screen  Please page stroke NP  Or  PA   Or MD from 8am -4 pm  as this patient from this time will be  followed by the stroke.   You can look them up on [email or web address]  Password [facility]   Areal Cochrane [facility] Pager Number [phone]

## 2018-04-14 NOTE — ED Notes (Signed)
ED TO INPATIENT HANDOFF REPORT  ED Nurse Name and Phone #: Sadie Haber., RN  S Name/Age/Gender Alan Rubio 85 y.o. male Room/Bed: WA04/WA04  Code Status   Code Status: Full Code  Home/SNF/Other Home Patient oriented to: self, place, time and situation Is this baseline? Yes   Triage Complete: Triage complete  Chief Complaint dizziness  Triage Note Pt BIB [facility] from home. Pt c/o dizziness x1 week, worsening today. Pt c/o blurred vision x1 week. Pt has fallen x2 today due to the dizziness. Pt experienced increased weakness. Pt denies pain.   EMS Orthostatics: Sitting: 160/78 Standing: 144/82   Allergies Allergies  Allergen Reactions  . Penicillins Rash    Did it involve swelling of the face/tongue/throat, SOB, or low BP? Yes Did it involve sudden or severe rash/hives, skin peeling, or any reaction on the inside of your mouth or nose? No Did you need to seek medical attention at a hospital or doctor's office? No When did it last happen? If all above answers are "NO", may proceed with cephalosporin use.       Level of Care/Admitting Diagnosis ED Disposition    ED Disposition Condition [facility] Area: [place] [[id number]]  Level of Care: Telemetry Medical [104]  I expect the patient will be discharged within 24 hours: Yes  LOW acuity---Tx typically complete <24 hrs---ACUTE conditions typically can be evaluated <24 hours---LABS likely to return to acceptable levels <24 hours---IS near functional baseline---EXPECTED to return to current living arrangement---NOT newly hypoxic: Meets criteria for 5C-Observation unit  Diagnosis: CVA (cerebral vascular accident) [facility]) [[id number]]  Admitting Physician: Shela Leff [[id number]]  Attending Physician: Shela Leff [[id number]]  PT Class (Do Not Modify): Observation [104]  PT Acc Code (Do Not Modify): Observation [10022]       B Medical/Surgery History Past Medical History:  Diagnosis  Date  . Cancer ([place])   . Diabetes mellitus without complication ([place])   . Hypertension   . Thyroid disease    Past Surgical History:  Procedure Laterality Date  . BACK SURGERY       A IV Location/Drains/Wounds Patient Lines/Drains/Airways Status   Active Line/Drains/Airways    Name:   Placement date:   Placement time:   Site:   Days:   Peripheral IV 04/13/18 Left Forearm   04/13/18    1943    Forearm   1          Intake/Output Last 24 hours  Intake/Output Summary (Last 24 hours) at 04/14/2018 0130 Last data filed at 04/13/2018 1943 Gross per 24 hour  Intake 400 ml  Output -  Net 400 ml    Labs/Imaging Results for orders placed or performed during the hospital encounter of 04/13/18 (from the past 48 hour(s))  Urinalysis, Routine w reflex microscopic     Status: Abnormal   Collection Time: 04/13/18  7:54 PM  Result Value Ref Range   Color, Urine YELLOW YELLOW   APPearance CLEAR CLEAR   Specific Gravity, Urine 1.019 1.005 - 1.030   pH 7.0 5.0 - 8.0   Glucose, UA 50 (A) NEGATIVE mg/dL   Hgb urine dipstick NEGATIVE NEGATIVE   Bilirubin Urine NEGATIVE NEGATIVE   Ketones, ur NEGATIVE NEGATIVE mg/dL   Protein, ur NEGATIVE NEGATIVE mg/dL   Nitrite NEGATIVE NEGATIVE   Leukocytes,Ua NEGATIVE NEGATIVE    Comment: Performed at [facility], [place] [address], [place], [place] [address]  CBG monitoring, ED     Status: Abnormal  Collection Time: 04/13/18  8:01 PM  Result Value Ref Range   Glucose-Capillary 177 (H) 70 - 99 mg/dL  CBC     Status: Abnormal   Collection Time: 04/13/18  9:38 PM  Result Value Ref Range   WBC 6.5 4.0 - 10.5 K/uL   RBC 3.83 (L) 4.22 - 5.81 MIL/uL   Hemoglobin 12.2 (L) 13.0 - 17.0 g/dL   HCT 36.4 (L) 39.0 - 52.0 %   MCV 95.0 80.0 - 100.0 fL   MCH 31.9 26.0 - 34.0 pg   MCHC 33.5 30.0 - 36.0 g/dL   RDW 12.7 11.5 - 15.5 %   Platelets 182 150 - 400 K/uL   nRBC 0.0 0.0 - 0.2 %    Comment: Performed at [facility], [place] [address] [place], [place] [address]  Comprehensive metabolic panel     Status: Abnormal   Collection Time: 04/13/18  9:38 PM  Result Value Ref Range   Sodium 137 135 - 145 mmol/L   Potassium 3.9 3.5 - 5.1 mmol/L   Chloride 107 98 - 111 mmol/L   CO2 22 22 - 32 mmol/L   Glucose, Bld 170 (H) 70 - 99 mg/dL   BUN 23 8 - 23 mg/dL   Creatinine, Ser 0.90 0.61 - 1.24 mg/dL   Calcium 8.9 8.9 - 10.3 mg/dL   Total Protein 6.5 6.5 - 8.1 g/dL   Albumin 3.8 3.5 - 5.0 g/dL   AST 29 15 - 41 U/L   ALT 22 0 - 44 U/L   Alkaline Phosphatase 43 38 - 126 U/L   Total Bilirubin 0.6 0.3 - 1.2 mg/dL   GFR calc non Af Amer >60 >60 mL/min   GFR calc Af Amer >60 >60 mL/min   Anion gap 8 5 - 15    Comment: Performed at [facility], [place] [address], [place], [place] [address]   Ct Head Wo Contrast  Result Date: 04/13/2018 CLINICAL DATA:  Altered LOC EXAM: CT HEAD WITHOUT CONTRAST TECHNIQUE: Contiguous axial images were obtained from the base of the skull through the vertex without intravenous contrast. COMPARISON:  None. FINDINGS: Brain: No acute territorial infarction, hemorrhage or intracranial mass. Moderate atrophy. Moderate small vessel ischemic changes of the white matter. Hypodensities in the left thalamus, consistent with age indeterminate lacunar infarct. Prominent ventricles felt secondary to atrophy Vascular: No hyperdense vessels. Vertebral and carotid vascular calcification Skull: Normal. Negative for fracture or focal lesion. Sinuses/Orbits: No acute finding. Other: None IMPRESSION: 1. Small hypodensities in the left thalamus suggesting age indeterminate lacunar infarcts. Negative for acute intracranial hemorrhage. 2. Atrophy and small vessel ischemic changes of the white matter. Electronically Signed   By: Donavan Foil M.D.   On: 04/13/2018 21:07    Pending Labs Unresulted Labs (From admission, onward)    Start     Ordered   04/14/18 0500  Hemoglobin A1c  Tomorrow  morning,   R     04/14/18 0013   04/14/18 0500  Lipid panel  Tomorrow morning,   R    Comments:  Fasting    04/14/18 0013   04/14/18 0500  CBC  Tomorrow morning,   R     04/14/18 0013   04/14/18 0500  Iron and TIBC  Tomorrow morning,   R     04/14/18 0013   04/14/18 0500  Ferritin  Tomorrow morning,   R     04/14/18 0013  Vitals/Pain Today's Vitals   04/13/18 2215 04/13/18 2245 04/13/18 2330 04/13/18 2345  BP: (!) 173/68 (!) 188/69 (!) 186/70 (!) 170/60  Pulse: 60 65 63 66  Resp: (!) 24 19 18 20   Temp:      TempSrc:      SpO2: 100% 100% 99% 99%  Weight:      Height:      PainSc:        Isolation Precautions No active isolations  Medications Medications  sodium chloride flush (NS) 0.9 % injection 3 mL (has no administration in time range)  allopurinol (ZYLOPRIM) tablet 300 mg (has no administration in time range)  levothyroxine (SYNTHROID, LEVOTHROID) tablet 75 mcg (has no administration in time range)  Polyethyl Glycol-Propyl Glycol 0.4-0.3 % SOLN 1 drop (has no administration in time range)   stroke: mapping our early stages of recovery book (has no administration in time range)  acetaminophen (TYLENOL) tablet 650 mg (has no administration in time range)    Or  acetaminophen (TYLENOL) solution 650 mg (has no administration in time range)    Or  acetaminophen (TYLENOL) suppository 650 mg (has no administration in time range)  enoxaparin (LOVENOX) injection 40 mg (has no administration in time range)  insulin aspart (novoLOG) injection 0-9 Units (has no administration in time range)  sodium chloride (PF) 0.9 % injection (has no administration in time range)  iohexol (OMNIPAQUE) 350 MG/ML injection 80 mL (80 mLs Intravenous Contrast Given 04/14/18 0027)    Mobility walks High fall risk   Focused Assessments Neuro Assessment Handoff:  Swallow screen pass? Yes          Neuro Assessment: Exceptions to WDL Neuro Checks:      Last Documented NIHSS  Modified Score:   Has TPA been given? No If patient is a Neuro Trauma and patient is going to OR before floor call report to [place] nurse: [phone] or [phone]     R Recommendations: See Admitting Provider Note  Report given to:   Additional Notes:

## 2018-04-14 NOTE — Evaluation (Signed)
Physical Therapy Evaluation Patient Details Name: Alan Rubio MRN: [id number] DOB: 01-27-1931 Today's Date: 04/14/2018   History of Present Illness  85 y.o.malewith medical history significant oftype 2 diabetes, CA, hypertension, hypothyroidism presenting to the hospital with a4-day history of dizziness, double vision, ataxia and reports of multiple falls. MRI showed 11 mm acute ischemic nonhemorrhagic left thalamic infarct with slight inferior extension into the left cerebral peduncle/midbrain. Additional acute/subacute scattered infarcts R frontal, R parietal, L occipital lobes. Petechial hemorrhage R frontal.  Clinical Impression  Pt admitted with/for s/s or stroke,  infarcts described above.  Pt is mobilizing at a min guard or better level.  Pt currently limited functionally due to the problems listed below.  (see problems list.)  Pt will benefit from PT to maximize function and safety to be able to get home safely with available assist .     Follow Up Recommendations Outpatient PT;Supervision/Assistance - 24 hour    Equipment Recommendations  None recommended by PT    Recommendations for Other Services       Precautions / Restrictions Precautions Precautions: Fall Precaution Comments: hx of 2 falls day of admission Restrictions Weight Bearing Restrictions: No      Mobility  Bed Mobility Overal bed mobility: Modified Independent             General bed mobility comments: increased effort to perform, able to complete from flat bed without handrail  Transfers Overall transfer level: Needs assistance Equipment used: None Transfers: Sit to/from Stand Sit to Stand: Min guard         General transfer comment: for general safety and immediate standing balance, no physical assist required  Ambulation/Gait Ambulation/Gait assistance: Min guard Gait Distance (Feet): 200 Feet Assistive device: None Gait Pattern/deviations: Step-through pattern Gait velocity:  moderate Gait velocity interpretation: 1.31 - 2.62 ft/sec, indicative of limited community ambulator General Gait Details: generally steady with several small deviations with scanning  Stairs            Wheelchair Mobility    Modified Rankin (Stroke Patients Only) Modified Rankin (Stroke Patients Only) Pre-Morbid Rankin Score: No symptoms Modified Rankin: Moderate disability     Balance Overall balance assessment: Needs assistance;History of Falls Sitting-balance support: Feet supported Sitting balance-Leahy Scale: Fair Sitting balance - Comments: slight posterior LOB with ROM testing (LE>UE) while seated EOB   Standing balance support: No upper extremity supported;During functional activity Standing balance-Leahy Scale: Fair Standing balance comment: mild instabilities noted                             Pertinent Vitals/Pain Pain Assessment: No/denies pain    Home Living Family/patient expects to be discharged to:: Private residence Living Arrangements: Spouse/significant other Available Help at Discharge: Family Type of Home: House Home Access: Stairs to enter Entrance Stairs-Rails: Right Entrance Stairs-Number of Steps: 2 Home Layout: Two level Home Equipment: Cane - single point      Prior Function Level of Independence: Independent         Comments: performing iADLs, driving, chopping wood     Hand Dominance   Dominant Hand: Right    Extremity/Trunk Assessment   Upper Extremity Assessment Upper Extremity Assessment: Generalized weakness RUE Deficits / Details: decreased fine motor noted RUE Coordination: decreased fine motor LUE Deficits / Details: slightly weaker than RUE, decreased fine motor LUE Coordination: decreased fine motor    Lower Extremity Assessment Lower Extremity Assessment: RLE deficits/detail;LLE deficits/detail RLE Deficits / Details:  WFL RLE Sensation: WNL LLE Deficits / Details: hip flexor and df weakness at  4- to 4/5, mostly isolate movement, but evidence of some synergy. LLE Sensation: WNL LLE Coordination: decreased fine motor       Communication      Cognition Arousal/Alertness: Awake/alert Behavior During Therapy: WFL for tasks assessed/performed Overall Cognitive Status: Impaired/Different from baseline Area of Impairment: Awareness;Problem solving;Following commands                       Following Commands: Follows one step commands with increased time;Follows multi-step commands inconsistently   Awareness: Emergent Problem Solving: Slow processing;Decreased initiation;Requires verbal cues;Requires tactile cues General Comments: pt with slower processing noted and requires repetition/break down of questions. Pt's spouse present and reports feels as if pt is about at his baseline; will continue to assess      General Comments General comments (skin integrity, edema, etc.): pt's spouse present through the whole session    Exercises     Assessment/Plan    PT Assessment Patient needs continued PT services  PT Problem List Decreased strength;Decreased activity tolerance;Decreased balance;Decreased mobility;Decreased coordination       PT Treatment Interventions Gait training;Stair training;Functional mobility training;Therapeutic activities;Balance training;Patient/family education;Neuromuscular re-education;DME instruction    PT Goals (Current goals can be found in the Care Plan section)  Acute Rehab PT Goals Patient Stated Goal: return home PT Goal Formulation: With patient Time For Goal Achievement: 04/21/18 Potential to Achieve Goals: Good    Frequency Min 3X/week   Barriers to discharge        Co-evaluation PT/OT/SLP Co-Evaluation/Treatment: Yes Reason for Co-Treatment: For patient/therapist safety;Complexity of the patient's impairments (multi-system involvement) PT goals addressed during session: Mobility/safety with mobility OT goals addressed  during session: ADL's and self-care       AM-PAC PT "6 Clicks" Mobility  Outcome Measure Help needed turning from your back to your side while in a flat bed without using bedrails?: None Help needed moving from lying on your back to sitting on the side of a flat bed without using bedrails?: None Help needed moving to and from a bed to a chair (including a wheelchair)?: A Little Help needed standing up from a chair using your arms (e.g., wheelchair or bedside chair)?: A Little Help needed to walk in hospital room?: A Little Help needed climbing 3-5 steps with a railing? : A Little 6 Click Score: 20    End of Session   Activity Tolerance: Patient tolerated treatment well Patient left: in bed;with call bell/phone within reach;with family/visitor present Nurse Communication: Mobility status PT Visit Diagnosis: Unsteadiness on feet (R26.81);Other abnormalities of gait and mobility (R26.89);Other symptoms and signs involving the nervous system (R29.898)    Time: 2542-7062 PT Time Calculation (min) (ACUTE ONLY): 31 min   Charges:   PT Evaluation $PT Eval Moderate Complexity: 1 Mod          04/14/2018  Donnella Sham, PT Acute Rehabilitation Services [phone]  (pager) [phone]  (office)  Tessie Fass Dot Splinter 04/14/2018, 5:43 PM

## 2018-04-14 NOTE — Progress Notes (Signed)
OT Cancellation Note  Patient Details Name: Revis Whalin MRN: [id number] DOB: 11-Oct-1931   Cancelled Treatment:    Reason Eval/Treat Not Completed: Patient at procedure or test/ unavailable; currently in echo and to have bubble study after. Will follow up for OT eval as able.  Lou Cal, OT Supplemental Rehabilitation Services Pager [phone] Office [phone]   Raymondo Band 04/14/2018, 11:53 AM

## 2018-04-14 NOTE — Evaluation (Signed)
Occupational Therapy Evaluation Patient Details Name: Alan Rubio MRN: [id number] DOB: 1931/12/17 Today's Date: 04/14/2018    History of Present Illness 85 y.o.malewith medical history significant oftype 2 diabetes, CA, hypertension, hypothyroidism presenting to the hospital with a4-day history of dizziness, double vision, ataxia and reports of multiple falls. MRI showed 11 mm acute ischemic nonhemorrhagic left thalamic infarct with slight inferior extension into the left cerebral peduncle/midbrain. Additional acute/subacute scattered infarcts R frontal, R parietal, L occipital lobes. Petechial hemorrhage R frontal.   Clinical Impression   This 85 y/o male presents with the above. PTA pt was independent with ADL, iADL and functional mobility. Pt completing functional mobility this session without AD and close minguard assist, mild unsteadiness noted but with no overt LOB. Pt currently requires setup assist for UB ADL, minguardA for standing grooming, toileting and LB ADL. Pt with questionable visual deficits (reports initially having diplopia upon admission but has since subsided) and higher level cognitive impairments - to be further assessed. He will benefit from continued acute OT services and recommend follow up therapy services after discharge to maximize his safety and independence with ADL, iADL and functional mobility. Will follow.  I have discussed the patient's current level of function related to the above deficits with the patient and pt's spouse.  They acknowledge understanding of this and feel they can provide the level of care the patient will need at home.       Follow Up Recommendations  Outpatient OT;Supervision/Assistance - 24 hour    Equipment Recommendations  3 in 1 bedside commode(vs shower seat)           Precautions / Restrictions Precautions Precautions: Fall Precaution Comments: hx of 2 falls day of admission Restrictions Weight Bearing Restrictions: No       Mobility Bed Mobility Overal bed mobility: Modified Independent             General bed mobility comments: increased effort to perform, able to complete from flat bed without handrail  Transfers Overall transfer level: Needs assistance Equipment used: None Transfers: Sit to/from Stand Sit to Stand: Min guard         General transfer comment: for general safety and immediate standing balance, no physical assist required    Balance Overall balance assessment: Needs assistance;History of Falls Sitting-balance support: Feet supported Sitting balance-Leahy Scale: Fair Sitting balance - Comments: slight posterior LOB with ROM testing (LE>UE) while seated EOB   Standing balance support: No upper extremity supported;During functional activity Standing balance-Leahy Scale: Fair Standing balance comment: mild instabilities noted                           ADL either performed or assessed with clinical judgement   ADL Overall ADL's : Needs assistance/impaired Eating/Feeding: Set up;Sitting   Grooming: Wash/dry hands;Min guard;Standing Grooming Details (indicate cue type and reason): VCs to use soap and how to use motion sensored soap/paper towel Upper Body Bathing: Min guard;Sitting   Lower Body Bathing: Min guard;Sit to/from stand   Upper Body Dressing : Set up;Sitting   Lower Body Dressing: Min guard;Sit to/from stand   Toilet Transfer: Min guard;Ambulation   Toileting- Clothing Manipulation and Hygiene: Min guard;Sit to/from stand Toileting - Clothing Manipulation Details (indicate cue type and reason): pt standing to void bladder in bathroom, minguard for balance and safety     Functional mobility during ADLs: Min guard General ADL Comments: pt mildly unsteady but no overt LOB noted; questionable visual and  cognitive deficits; educated pt to ease back into daily activities and to refrain from higher level iADL initially such as driving, chopping wood      Vision Baseline Vision/History: Wears glasses Wears Glasses: Reading only Patient Visual Report: Diplopia(initially, reports has subsided) Vision Assessment?: Yes Eye Alignment: Within Functional Limits Ocular Range of Motion: Within Functional Limits Alignment/Gaze Preference: Within Defined Limits Tracking/Visual Pursuits: Decreased smoothness of horizontal tracking;Decreased smoothness of vertical tracking Visual Fields: Impaired-to be further tested in functional context(decreased peripheral vision noted ) Additional Comments: pt noted to have decreased peripheral vision (L and R visual fields); reports diplopia has subsided but will continue to assess     Perception     Praxis      Pertinent Vitals/Pain Pain Assessment: No/denies pain     Hand Dominance Right   Extremity/Trunk Assessment Upper Extremity Assessment Upper Extremity Assessment: Generalized weakness;RUE deficits/detail;LUE deficits/detail RUE Deficits / Details: decreased fine motor noted RUE Coordination: decreased fine motor LUE Deficits / Details: slightly weaker than RUE, decreased fine motor LUE Coordination: decreased fine motor   Lower Extremity Assessment Lower Extremity Assessment: Defer to PT evaluation       Communication     Cognition Arousal/Alertness: Awake/alert Behavior During Therapy: WFL for tasks assessed/performed Overall Cognitive Status: Impaired/Different from baseline Area of Impairment: Awareness;Problem solving;Following commands                       Following Commands: Follows one step commands with increased time;Follows multi-step commands inconsistently   Awareness: Emergent Problem Solving: Slow processing;Decreased initiation;Requires verbal cues;Requires tactile cues General Comments: pt with slower processing noted and requires repetition/break down of questions. Pt's spouse present and reports feels as if pt is about at his baseline; will continue to  assess   General Comments  pt spouse present during session    Exercises     Shoulder Instructions      Home Living Family/patient expects to be discharged to:: Private residence Living Arrangements: Spouse/significant other Available Help at Discharge: Family Type of Home: House Home Access: Stairs to enter Technical brewer of Steps: 2 Entrance Stairs-Rails: Right Home Layout: Two level Alternate Level Stairs-Number of Steps: flight   Bathroom Shower/Tub: Teacher, early years/pre: Handicapped height     [place]: [place] - single point          Prior Functioning/Environment Level of Independence: Independent        Comments: performing iADLs, driving, chopping wood        OT Problem List: Decreased strength;Decreased range of motion;Decreased activity tolerance;Impaired vision/perception;Impaired balance (sitting and/or standing);Decreased coordination;Decreased cognition      OT Treatment/Interventions: Self-care/ADL training;Therapeutic exercise;Neuromuscular education;DME and/or AE instruction;Therapeutic activities;Patient/family education;Balance training;Cognitive remediation/compensation;Visual/perceptual remediation/compensation    OT Goals(Current goals can be found in the care plan section) Acute Rehab OT Goals Patient Stated Goal: return home OT Goal Formulation: With patient Time For Goal Achievement: 04/28/18 Potential to Achieve Goals: Good  OT Frequency: Min 2X/week   Barriers to D/C:            Co-evaluation              AM-PAC OT "6 Clicks" Daily Activity     Outcome Measure Help from another person eating meals?: None Help from another person taking care of personal grooming?: None Help from another person toileting, which includes using toliet, bedpan, or urinal?: None Help from another person bathing (including washing, rinsing, drying)?: A Little Help from another person  to put on and taking off regular  upper body clothing?: None Help from another person to put on and taking off regular lower body clothing?: A Little 6 Click Score: 22   End of Session Nurse Communication: Mobility status  Activity Tolerance: Patient tolerated treatment well Patient left: in bed;with call bell/phone within reach;with bed alarm set;with family/visitor present  OT Visit Diagnosis: Unsteadiness on feet (R26.81);History of falling (Z91.81);Other symptoms and signs involving the nervous system (R29.898)                Time: 2595-6387 OT Time Calculation (min): 31 min Charges:  OT General Charges $OT Visit: 1 Visit OT Evaluation $OT Eval Moderate Complexity: [place], OT [employer] Pager [phone] Office [phone]   Raymondo Band 04/14/2018, 4:56 PM

## 2018-04-14 NOTE — Progress Notes (Signed)
  Echocardiogram 2D Echocardiogram has been performed.  Alan Rubio 04/14/2018, 12:20 PM

## 2018-04-14 NOTE — Plan of Care (Signed)
porgressing

## 2018-04-14 NOTE — Evaluation (Signed)
Speech Language Pathology Evaluation Patient Details Name: Alan Rubio MRN: [id number] DOB: 1931/09/22 Today's Date: 04/14/2018 Time: 2725-3664 SLP Time Calculation (min) (ACUTE ONLY): 41 min  Problem List:  Patient Active Problem List   Diagnosis Date Noted  . CVA (cerebral vascular accident) ([place]) 04/14/2018  . Anemia 04/14/2018  . HTN (hypertension) 04/14/2018  . Gout 04/14/2018  . Type 2 diabetes mellitus ([place]) 04/14/2018   Past Medical History:  Past Medical History:  Diagnosis Date  . Cancer ([place])   . Diabetes mellitus without complication ([place])   . Hypertension   . Thyroid disease    Past Surgical History:  Past Surgical History:  Procedure Laterality Date  . BACK SURGERY     HPI:  85 yo male adm to [facility] with diplopia, dizziness and ataxia starting 4 days ago.  Imaging showed multiple cvas bilateral hemisphere, suggestion embolic and conversion of right frontal.  PMH + for CA, DM2, HTN, thyroid disease. Pt also with melena and GI is to see per MD notes.  Speech eval ordered.    Assessment / Plan / Recommendation Clinical Impression  Patient without focal cn deficits impacting speech, language and speech is fluent without aphasia nor dysarthria.  MOCA *non visual* items tested with pt scoring 19/25 - 76% - WFL.  Pt does self report decreased understanding of language = he does present with heairng loss also.  No difficulites with language during session.  Decreased memory 2/5 Independently recalled which SlP suspects is due to decreased attention.  Provided pt with attention tasks to maximize functional memory and reviewed s/s of stroke and need to have his wife assure his medications/bills managed properly.  No SLP follow up needed as all education completed.  Thanks for this consult.      SLP Assessment  SLP Recommendation/Assessment: Patient does not need any further Speech [place] [facility] SLP Visit Diagnosis: Cognitive communication deficit (R41.841)     Follow Up Recommendations  None    Frequency and Duration      n/a     SLP Evaluation Cognition  Overall Cognitive Status: Impaired/Different from baseline Arousal/Alertness: Awake/alert Orientation Level: Oriented X4 Attention: Sustained Sustained Attention: Appears intact Memory: Impaired Memory Impairment: Retrieval deficit(2/5 words I, 3/5 with cue) Awareness: Appears intact Problem Solving: Appears intact Safety/Judgment: Appears intact       Comprehension  Auditory Comprehension Overall Auditory Comprehension: Appears within functional limits for tasks assessed Yes/No Questions: Not tested Commands: Within Functional Limits Conversation: Complex EffectiveTechniques: Extra processing time Visual Recognition/Discrimination Discrimination: Within Function Limits Reading Comprehension Reading Status: Within funtional limits    Expression Expression Primary Mode of Expression: Verbal Verbal Expression Overall Verbal Expression: Appears within functional limits for tasks assessed Initiation: No impairment Repetition: No impairment Naming: No impairment Pragmatics: No impairment Written Expression Dominant Hand: Right Written Expression: Not tested(pt with diplopia)   Oral / Motor  Oral Motor/Sensory Function Overall Oral Motor/Sensory Function: Within functional limits Motor Speech Overall Motor Speech: Appears within functional limits for tasks assessed Respiration: Within functional limits Resonance: Within functional limits Articulation: Within functional limitis Intelligibility: Intelligible Motor Planning: Witnin functional limits   GO                    Alan Rubio 04/14/2018, 9:57 AM   Alan Salk, MS [facility] SLP Acute Rehab Services Pager [phone] Office [phone]

## 2018-04-14 NOTE — Progress Notes (Addendum)
PROGRESS NOTE    Alan Rubio  WRU:[id number] DOB: 30-Jul-1931 DOA: 04/13/2018 PCP: [name], MD   Brief Narrative:  HPI On 04/13/2018 by Dr. Shela Leff Alan Rubio is a 85 y.o. male with medical history significant of type 2 diabetes, hypertension, hypothyroidism presenting to the hospital with a 4-day history of dizziness, double vision, and ataxia.  States he has double vision when looking with both eyes or just his right eye.  When looking with his left eye only he does not have double vision.  States when trying to walk he is falling to his right side.  Wife at bedside is concerned that patient has had 2 recent falls.  He did not hit his head or sustain any injuries from the falls.  His symptoms have been persistent.  Denies any history of prior stroke.  He does not smoke cigarettes.  States he was previously taking a baby aspirin daily but quit taking it 3 weeks ago as it was causing him to have diarrhea and dark stools.  His dark stools resolved after he stopped aspirin.  Denies any melena or hematochezia at present.  States his cholesterol has always been low on labs checked previously.  Assessment & Plan   Acute CVA -Patient presented with dizziness, ataxia, double vision.   -Head CT: Small hypodensities left hypothalamus suggesting age-indeterminate lacunar infarcts.  Negative for acute intracranial hemorrhage. -MRI showed 11 mm acute ischemic nonhemorrhagic left thalamic infarct with slight inferior extension into the left cerebral peduncle/midbrain.  Few additional scattered acute to subacute infarcts involving right frontal and parietal lobes as well as left occipital lobe.  Associated petechial hemorrhage about the subacute right frontal infarct.  -CTA head and neck: Negative CTA for large vessel occlusion.  Multifocal moderate to severe left P1 and P2 stenosis without vascular occlusion.  Severe atheromatous stenosis at the origin of the right vertebral artery.  Atheromatous  stenosis about the carotid bifurcations up to 50% narrowing on the left, mild 25% narrowing on the right. -Hemoglobin A1c 7.9, LDL 84 -Neurology consulted and appreciated -Pending echocardiogram with bubble study -Speech therapy evaluated patient, no further speech therapy needs -Pending PT/OT consultations  Normocytic anemia -Patient denies melena or hematochezia present time -Patient states he is never had a colonoscopy with gastroenterologist -Hemoglobin currently stable -Patient states that he was having darker stools and diarrhea when he was on his aspirin however at this subsided once he stopped taking aspirin. -Will order Anemia panel -FOBT pending  -Continue to monitor CBC  Essential Hypertension -Allow permissive hypertension given CVA  Gout -Continue allopurinol  Diabetes mellitus, type II -hemoglobin A1c as above -Hold Actos -Place on insulin sliding scale with CBG monitoring  Hypothyroidism -Continue Synthroid   DVT Prophylaxis Lovenox  Code Status: Full  Family Communication: None at bedside  Disposition Plan: Currently in observation, pending work-up and neurology recommendations.  Suspect home on discharge  Consultants Neurology  Procedures  None  Antibiotics   Anti-infectives (From admission, onward)   None      Subjective:   Alan Rubio seen and examined today.  Patient states that he is feeling better when lying down however does not know whether he will have dizziness and blurry vision when getting up.  Denies current chest pain, shortness of breath, abdominal pain, nausea or vomiting, diarrhea or constipation, melena or hematochezia.  Objective:   Vitals:   04/14/18 0739 04/14/18 0900 04/14/18 1100 04/14/18 1151  BP: (!) 186/71 (!) 188/89 (!) 186/86 (!) 165/70  Pulse:  63 (!) 57 (!) 55 (!) 57  Resp: 14   20  Temp: 98 F (36.7 C) 97.8 F (36.6 C) 98.6 F (37 C) 98.3 F (36.8 C)  TempSrc: Oral Oral Oral Oral  SpO2: 97%   99%  Weight:       Height:        Intake/Output Summary (Last 24 hours) at 04/14/2018 1210 Last data filed at 04/14/2018 3532 Gross per 24 hour  Intake 400 ml  Output 275 ml  Net 125 ml   Filed Weights   04/13/18 1937 04/13/18 1947 04/14/18 0300  Weight: 82.6 kg 78 kg 80 kg    Exam  General: Well developed, well nourished, NAD, appears stated age  20: NCAT, mucous membranes moist.   Neck: Supple  Cardiovascular: S1 S2 auscultated, RRR  Respiratory: Clear to auscultation bilaterally with equal chest rise  Abdomen: Soft, nontender, nondistended, + bowel sounds  Extremities: warm dry without cyanosis clubbing or edema  Neuro: AAOx3, nonfocal, horizontal nystagmus on looking to the right  Psych: Normal affect and demeanor, pleasant    Data Reviewed: I have personally reviewed following labs and imaging studies  CBC: Recent Labs  Lab 04/13/18 2138  WBC 6.5  HGB 12.2*  HCT 36.4*  MCV 95.0  PLT 992   Basic Metabolic Panel: Recent Labs  Lab 04/13/18 2138  NA 137  K 3.9  CL 107  CO2 22  GLUCOSE 170*  BUN 23  CREATININE 0.90  CALCIUM 8.9   GFR: Estimated Creatinine Clearance: 65.1 mL/min (by C-G formula based on SCr of 0.9 mg/dL). Liver Function Tests: Recent Labs  Lab 04/13/18 2138  AST 29  ALT 22  ALKPHOS 43  BILITOT 0.6  PROT 6.5  ALBUMIN 3.8   No results for input(s): LIPASE, AMYLASE in the last 168 hours. No results for input(s): AMMONIA in the last 168 hours. Coagulation Profile: No results for input(s): INR, PROTIME in the last 168 hours. Cardiac Enzymes: No results for input(s): CKTOTAL, CKMB, CKMBINDEX, TROPONINI in the last 168 hours. BNP (last 3 results) No results for input(s): PROBNP in the last 8760 hours. HbA1C: Recent Labs    04/14/18 0955  HGBA1C 7.9*   CBG: Recent Labs  Lab 04/13/18 2001 04/14/18 0620 04/14/18 1046  GLUCAP 177* 155* 129*   Lipid Profile: Recent Labs    04/14/18 0955  CHOL 143  HDL 39*  LDLCALC 84  TRIG  98  CHOLHDL 3.7   Thyroid Function Tests: No results for input(s): TSH, T4TOTAL, FREET4, T3FREE, THYROIDAB in the last 72 hours. Anemia Panel: No results for input(s): VITAMINB12, FOLATE, FERRITIN, TIBC, IRON, RETICCTPCT in the last 72 hours. Urine analysis:    Component Value Date/Time   COLORURINE YELLOW 04/13/2018 1954   APPEARANCEUR CLEAR 04/13/2018 1954   LABSPEC 1.019 04/13/2018 [place] 7.0 04/13/2018 1954   GLUCOSEU 50 (A) 04/13/2018 [place] NEGATIVE 04/13/2018 [place] NEGATIVE 04/13/2018 [place] NEGATIVE 04/13/2018 1954   PROTEINUR NEGATIVE 04/13/2018 1954   NITRITE NEGATIVE 04/13/2018 1954   LEUKOCYTESUR NEGATIVE 04/13/2018 1954   Sepsis Labs: @LABRCNTIP (procalcitonin:4,lacticidven:4)  )No results found for this or any previous visit (from the past 240 hour(s)).    Radiology Studies: Ct Angio Head W Or Wo Contrast  Result Date: 04/14/2018 CLINICAL DATA:  Initial evaluation for blurry vision, recent falls. EXAM: CT ANGIOGRAPHY HEAD AND NECK TECHNIQUE: Multidetector CT imaging of the head and neck was performed using the standard protocol during  bolus administration of intravenous contrast. Multiplanar CT image reconstructions and MIPs were obtained to evaluate the vascular anatomy. Carotid stenosis measurements (when applicable) are obtained utilizing NASCET criteria, using the distal internal carotid diameter as the denominator. CONTRAST:  17mL OMNIPAQUE IOHEXOL 350 MG/ML SOLN COMPARISON:  Prior head CT from 04/13/2018 FINDINGS: CTA NECK FINDINGS Aortic arch: Partially visualized aortic arch of normal caliber with normal branch pattern. Moderate atherosclerotic change about the aortic arch and origin of the great vessels. No definite flow-limiting stenosis, although origin of the great vessels incompletely visualized. Visualized subclavian arteries widely patent. Right carotid system: Right common carotid artery patent from its origin to the  bifurcation. Concentric calcified plaque about the right bifurcation/proximal right ICA with relatively mild 25 narrowing by NASCET criteria. Right ICA widely patent distally to the skull base without stenosis or other vascular abnormality. Left carotid system: Visualized left common carotid artery patent from its origin to the bifurcation without stenosis. Scattered calcified plaque about the left bifurcation/proximal left ICA with associated stenosis of up to 50% by NASCET criteria. Left ICA mildly tortuous but otherwise widely patent to the skull base without stenosis or other vascular abnormality. Vertebral arteries: Both of the vertebral arteries arise from the subclavian arteries. Mixed plaque at the origin of the right vertebral artery with associated severe stenosis (series 7, image 299). Vertebral arteries otherwise patent within the neck without stenosis, dissection, or occlusion. Left vertebral artery dominant. Skeleton: No acute osseous finding. Moderate multilevel cervical spondylolysis. No discrete osseous lesions. Patient is edentulous. Other neck: Thyroid heterogeneous with multifocal predominantly subcentimeter nodules, of doubtful significance. No other acute soft tissue abnormality within the neck. Upper chest: 7 mm mildly spiculated nodule at the right lung apex (series 5, image 19). Review of the MIP images confirms the above findings CTA HEAD FINDINGS Anterior circulation: Petrous segments widely patent. Diffuse atheromatous plaque throughout the cavernous/supraclinoid ICAs with resultant mild to moderate diffuse narrowing (no more than 50%). ICA termini well perfused. A1 segments patent bilaterally. Normal anterior communicating artery. Anterior cerebral arteries widely patent to their distal aspects. M1 segments widely patent. Distal MCA branches well perfused and symmetric. Posterior circulation: Both vertebral arteries widely patent as it crosses into the cranial vault. Left vertebral  artery dominant. Atheromatous plaque with resultant moderate multifocal stenoses seen involving the dominant left V4 segment. On the right, scattered atheromatous plaque within the distal right V4 segment results in moderate to severe multifocal segmental stenoses (series 7, image 138). Posterior inferior cerebral arteries patent bilaterally. Basilar widely patent to its distal aspect. Superior cerebral arteries patent bilaterally. Both of the posterior cerebral arteries primarily supplied via the basilar. Right PCA widely patent to its distal aspect. Focal severe left P1 stenosis (series 8, image 103). Additional moderate to severe tandem stenoses seen involving the mid-distal left P2 segment. Left PCA is perfused to its distal aspect. Venous sinuses: Patent. Anatomic variants: None significant. Delayed phase: Small focus of patchy enhancement seen involving the cortical gray matter and underlying subcortical white matter at the anterior right frontal lobe (series 13, image 25, 24), indeterminate. No definite discrete mass within this region. Review of the MIP images confirms the above findings IMPRESSION: 1. Negative CTA for large vessel occlusion. 2. Multifocal moderate to severe left P1 and P2 stenoses without vascular occlusion. 3. Severe atheromatous stenosis at the origin of the right vertebral artery, with additional moderate to severe bilateral V4 stenoses as above, right worse than left. Atheromatous stenoses about the carotid bifurcations with up to  50% narrowing on the left and mild 25% narrowing on the right. 4. Moderate atherosclerotic change throughout the carotid siphons with associated moderate diffuse narrowing. 5. Small focus of patchy enhancement involving the anterior right frontal cortex as above, indeterminate, but could reflect the sequelae of subacute ischemia. Correlation with brain MRI recommended. Electronically Signed   By: Jeannine Boga M.D.   On: 04/14/2018 01:58   Ct Head Wo  Contrast  Result Date: 04/13/2018 CLINICAL DATA:  Altered LOC EXAM: CT HEAD WITHOUT CONTRAST TECHNIQUE: Contiguous axial images were obtained from the base of the skull through the vertex without intravenous contrast. COMPARISON:  None. FINDINGS: Brain: No acute territorial infarction, hemorrhage or intracranial mass. Moderate atrophy. Moderate small vessel ischemic changes of the white matter. Hypodensities in the left thalamus, consistent with age indeterminate lacunar infarct. Prominent ventricles felt secondary to atrophy Vascular: No hyperdense vessels. Vertebral and carotid vascular calcification Skull: Normal. Negative for fracture or focal lesion. Sinuses/Orbits: No acute finding. Other: None IMPRESSION: 1. Small hypodensities in the left thalamus suggesting age indeterminate lacunar infarcts. Negative for acute intracranial hemorrhage. 2. Atrophy and small vessel ischemic changes of the white matter. Electronically Signed   By: Donavan Foil M.D.   On: 04/13/2018 21:07   Ct Angio Neck W Or Wo Contrast  Result Date: 04/14/2018 CLINICAL DATA:  Initial evaluation for blurry vision, recent falls. EXAM: CT ANGIOGRAPHY HEAD AND NECK TECHNIQUE: Multidetector CT imaging of the head and neck was performed using the standard protocol during bolus administration of intravenous contrast. Multiplanar CT image reconstructions and MIPs were obtained to evaluate the vascular anatomy. Carotid stenosis measurements (when applicable) are obtained utilizing NASCET criteria, using the distal internal carotid diameter as the denominator. CONTRAST:  65mL OMNIPAQUE IOHEXOL 350 MG/ML SOLN COMPARISON:  Prior head CT from 04/13/2018 FINDINGS: CTA NECK FINDINGS Aortic arch: Partially visualized aortic arch of normal caliber with normal branch pattern. Moderate atherosclerotic change about the aortic arch and origin of the great vessels. No definite flow-limiting stenosis, although origin of the great vessels incompletely  visualized. Visualized subclavian arteries widely patent. Right carotid system: Right common carotid artery patent from its origin to the bifurcation. Concentric calcified plaque about the right bifurcation/proximal right ICA with relatively mild 25 narrowing by NASCET criteria. Right ICA widely patent distally to the skull base without stenosis or other vascular abnormality. Left carotid system: Visualized left common carotid artery patent from its origin to the bifurcation without stenosis. Scattered calcified plaque about the left bifurcation/proximal left ICA with associated stenosis of up to 50% by NASCET criteria. Left ICA mildly tortuous but otherwise widely patent to the skull base without stenosis or other vascular abnormality. Vertebral arteries: Both of the vertebral arteries arise from the subclavian arteries. Mixed plaque at the origin of the right vertebral artery with associated severe stenosis (series 7, image 299). Vertebral arteries otherwise patent within the neck without stenosis, dissection, or occlusion. Left vertebral artery dominant. Skeleton: No acute osseous finding. Moderate multilevel cervical spondylolysis. No discrete osseous lesions. Patient is edentulous. Other neck: Thyroid heterogeneous with multifocal predominantly subcentimeter nodules, of doubtful significance. No other acute soft tissue abnormality within the neck. Upper chest: 7 mm mildly spiculated nodule at the right lung apex (series 5, image 19). Review of the MIP images confirms the above findings CTA HEAD FINDINGS Anterior circulation: Petrous segments widely patent. Diffuse atheromatous plaque throughout the cavernous/supraclinoid ICAs with resultant mild to moderate diffuse narrowing (no more than 50%). ICA termini well perfused. A1 segments patent  bilaterally. Normal anterior communicating artery. Anterior cerebral arteries widely patent to their distal aspects. M1 segments widely patent. Distal MCA branches well  perfused and symmetric. Posterior circulation: Both vertebral arteries widely patent as it crosses into the cranial vault. Left vertebral artery dominant. Atheromatous plaque with resultant moderate multifocal stenoses seen involving the dominant left V4 segment. On the right, scattered atheromatous plaque within the distal right V4 segment results in moderate to severe multifocal segmental stenoses (series 7, image 138). Posterior inferior cerebral arteries patent bilaterally. Basilar widely patent to its distal aspect. Superior cerebral arteries patent bilaterally. Both of the posterior cerebral arteries primarily supplied via the basilar. Right PCA widely patent to its distal aspect. Focal severe left P1 stenosis (series 8, image 103). Additional moderate to severe tandem stenoses seen involving the mid-distal left P2 segment. Left PCA is perfused to its distal aspect. Venous sinuses: Patent. Anatomic variants: None significant. Delayed phase: Small focus of patchy enhancement seen involving the cortical gray matter and underlying subcortical white matter at the anterior right frontal lobe (series 13, image 25, 24), indeterminate. No definite discrete mass within this region. Review of the MIP images confirms the above findings IMPRESSION: 1. Negative CTA for large vessel occlusion. 2. Multifocal moderate to severe left P1 and P2 stenoses without vascular occlusion. 3. Severe atheromatous stenosis at the origin of the right vertebral artery, with additional moderate to severe bilateral V4 stenoses as above, right worse than left. Atheromatous stenoses about the carotid bifurcations with up to 50% narrowing on the left and mild 25% narrowing on the right. 4. Moderate atherosclerotic change throughout the carotid siphons with associated moderate diffuse narrowing. 5. Small focus of patchy enhancement involving the anterior right frontal cortex as above, indeterminate, but could reflect the sequelae of subacute  ischemia. Correlation with brain MRI recommended. Electronically Signed   By: Jeannine Boga M.D.   On: 04/14/2018 01:58   Mr Brain Wo Contrast  Result Date: 04/14/2018 CLINICAL DATA:  Initial evaluation for intermittent dizziness for 1 week, ataxia, blurry vision. Evaluate for stroke. EXAM: MRI HEAD WITHOUT CONTRAST TECHNIQUE: Multiplanar, multiecho pulse sequences of the brain and surrounding structures were obtained without intravenous contrast. COMPARISON:  Prior CT and CTA from earlier the same day as well as 04/13/2018. FINDINGS: Brain: Diffuse prominence of the CSF containing spaces compatible with generalized age-related cerebral atrophy. Patchy and confluent T2/FLAIR hyperintensity within the periventricular deep white matter both cerebral hemispheres most consistent with chronic small vessel ischemic disease, moderate nature. Superimposed remote lacunar infarcts present within the bilateral thalami, left greater than right. Additional remote lacunar infarcts present at the bilateral basal ganglia. 11 mm focus of restricted diffusion at the medial left thalamus, compatible with acute ischemic infarct. No associated hemorrhage or mass effect. Slight extension into the left cerebral peduncle/midbrain. Few additional punctate foci of diffusion abnormality seen involving the subcortical right parietal lobe (series 5, image 82) and left occipital lobe (series 5, image 63), also consistent with acute to early subacute ischemic infarcts. Additional late subacute ischemic infarcts seen involving the anterior right frontal cortical gray matter and underlying white matter, corresponding with patchy enhancement seen on prior CTA (series 5, image 80, 78). Associated mild petechial in hemorrhage at this site (series 14, image 43). No other evidence for acute or subacute ischemia. No other acute or chronic intracranial hemorrhage. No mass lesion, midline shift or mass effect. No hydrocephalus. No extra-axial  fluid collection. Pituitary gland suprasellar region normal. Midline structures intact. Vascular: Major intravascular flow  voids maintained. Skull and upper cervical spine: Craniocervical junction within normal limits. Visualized upper cervical spine normal. Bone marrow signal intensity within normal limits. No scalp soft tissue abnormality. Sinuses/Orbits: Patient status post bilateral ocular lens replacement. Axial myopia noted. Mucosal thickening with few small retention cyst noted within the maxillary sinuses. Paranasal sinuses are otherwise clear. Small bilateral mastoid effusions, likely benign/sterile. Inner ear structures grossly normal. Other: None. IMPRESSION: 1. 11 mm acute ischemic nonhemorrhagic left thalamic infarct, with slight inferior extension into the left cerebral peduncle/midbrain. 2. Few additional scattered acute to subacute infarcts involving the right frontal and parietal lobes as well as the left occipital lobe as above. There is associated petechial hemorrhage about the subacute right frontal infarct, which corresponds with patchy enhancement seen on prior CTA. 3. Underlying atrophy with moderate chronic microvascular ischemic disease. Electronically Signed   By: Jeannine Boga M.D.   On: 04/14/2018 04:44     Scheduled Meds:  allopurinol  300 mg Oral Daily   aspirin EC  81 mg Oral Daily   enoxaparin (LOVENOX) injection  40 mg Subcutaneous Q24H   insulin aspart  0-9 Units Subcutaneous TID WC   levothyroxine  75 mcg Oral QAC breakfast   sodium chloride (PF)       sodium chloride flush  3 mL Intravenous Once   Continuous Infusions:   LOS: 0 days   Time Spent in minutes   30 minutes  Charnette Younkin D.O. on 04/14/2018 at 12:10 PM  Between 7am to 7pm - Please see pager noted on [email or web address]  After 7pm go to [email or web address]  And look for the night coverage person covering for me after hours  [facility] Office  [phone]

## 2018-04-14 NOTE — Progress Notes (Addendum)
STROKE TEAM PROGRESS NOTE   INTERVAL HISTORY No family is at the bedside.  Deficits stable, improved. Reviewed personally history of presenting illness with the patient. Vitals:   04/14/18 0739 04/14/18 0900 04/14/18 1100 04/14/18 1151  BP: (!) 186/71 (!) 188/89 (!) 186/86 (!) 165/70  Pulse: 63 (!) 57 (!) 55 (!) 57  Resp: 14   20  Temp: 98 F (36.7 C) 97.8 F (36.6 C) 98.6 F (37 C) 98.3 F (36.8 C)  TempSrc: Oral Oral Oral Oral  SpO2: 97%   99%  Weight:      Height:        CBC:  Recent Labs  Lab 04/13/18 2138  WBC 6.5  HGB 12.2*  HCT 36.4*  MCV 95.0  PLT 650    Basic Metabolic Panel:  Recent Labs  Lab 04/13/18 2138  NA 137  K 3.9  CL 107  CO2 22  GLUCOSE 170*  BUN 23  CREATININE 0.90  CALCIUM 8.9   Lipid Panel:     Component Value Date/Time   CHOL 143 04/14/2018 0955   TRIG 98 04/14/2018 0955   HDL 39 (L) 04/14/2018 0955   CHOLHDL 3.7 04/14/2018 0955   VLDL 20 04/14/2018 0955   LDLCALC 84 04/14/2018 0955   HgbA1c:  Lab Results  Component Value Date   HGBA1C 7.9 (H) 04/14/2018   Urine Drug Screen: No results found for: LABOPIA, COCAINSCRNUR, LABBENZ, AMPHETMU, THCU, LABBARB  Alcohol Level No results found for: ETH  IMAGING Ct Angio Head W Or Wo Contrast  Result Date: 04/14/2018 CLINICAL DATA:  Initial evaluation for blurry vision, recent falls. EXAM: CT ANGIOGRAPHY HEAD AND NECK TECHNIQUE: Multidetector CT imaging of the head and neck was performed using the standard protocol during bolus administration of intravenous contrast. Multiplanar CT image reconstructions and MIPs were obtained to evaluate the vascular anatomy. Carotid stenosis measurements (when applicable) are obtained utilizing NASCET criteria, using the distal internal carotid diameter as the denominator. CONTRAST:  2mL OMNIPAQUE IOHEXOL 350 MG/ML SOLN COMPARISON:  Prior head CT from 04/13/2018 FINDINGS: CTA NECK FINDINGS Aortic arch: Partially visualized aortic arch of normal caliber  with normal branch pattern. Moderate atherosclerotic change about the aortic arch and origin of the great vessels. No definite flow-limiting stenosis, although origin of the great vessels incompletely visualized. Visualized subclavian arteries widely patent. Right carotid system: Right common carotid artery patent from its origin to the bifurcation. Concentric calcified plaque about the right bifurcation/proximal right ICA with relatively mild 25 narrowing by NASCET criteria. Right ICA widely patent distally to the skull base without stenosis or other vascular abnormality. Left carotid system: Visualized left common carotid artery patent from its origin to the bifurcation without stenosis. Scattered calcified plaque about the left bifurcation/proximal left ICA with associated stenosis of up to 50% by NASCET criteria. Left ICA mildly tortuous but otherwise widely patent to the skull base without stenosis or other vascular abnormality. Vertebral arteries: Both of the vertebral arteries arise from the subclavian arteries. Mixed plaque at the origin of the right vertebral artery with associated severe stenosis (series 7, image 299). Vertebral arteries otherwise patent within the neck without stenosis, dissection, or occlusion. Left vertebral artery dominant. Skeleton: No acute osseous finding. Moderate multilevel cervical spondylolysis. No discrete osseous lesions. Patient is edentulous. Other neck: Thyroid heterogeneous with multifocal predominantly subcentimeter nodules, of doubtful significance. No other acute soft tissue abnormality within the neck. Upper chest: 7 mm mildly spiculated nodule at the right lung apex (series 5, image 19). Review  of the MIP images confirms the above findings CTA HEAD FINDINGS Anterior circulation: Petrous segments widely patent. Diffuse atheromatous plaque throughout the cavernous/supraclinoid ICAs with resultant mild to moderate diffuse narrowing (no more than 50%). ICA termini well  perfused. A1 segments patent bilaterally. Normal anterior communicating artery. Anterior cerebral arteries widely patent to their distal aspects. M1 segments widely patent. Distal MCA branches well perfused and symmetric. Posterior circulation: Both vertebral arteries widely patent as it crosses into the cranial vault. Left vertebral artery dominant. Atheromatous plaque with resultant moderate multifocal stenoses seen involving the dominant left V4 segment. On the right, scattered atheromatous plaque within the distal right V4 segment results in moderate to severe multifocal segmental stenoses (series 7, image 138). Posterior inferior cerebral arteries patent bilaterally. Basilar widely patent to its distal aspect. Superior cerebral arteries patent bilaterally. Both of the posterior cerebral arteries primarily supplied via the basilar. Right PCA widely patent to its distal aspect. Focal severe left P1 stenosis (series 8, image 103). Additional moderate to severe tandem stenoses seen involving the mid-distal left P2 segment. Left PCA is perfused to its distal aspect. Venous sinuses: Patent. Anatomic variants: None significant. Delayed phase: Small focus of patchy enhancement seen involving the cortical gray matter and underlying subcortical white matter at the anterior right frontal lobe (series 13, image 25, 24), indeterminate. No definite discrete mass within this region. Review of the MIP images confirms the above findings IMPRESSION: 1. Negative CTA for large vessel occlusion. 2. Multifocal moderate to severe left P1 and P2 stenoses without vascular occlusion. 3. Severe atheromatous stenosis at the origin of the right vertebral artery, with additional moderate to severe bilateral V4 stenoses as above, right worse than left. Atheromatous stenoses about the carotid bifurcations with up to 50% narrowing on the left and mild 25% narrowing on the right. 4. Moderate atherosclerotic change throughout the carotid siphons  with associated moderate diffuse narrowing. 5. Small focus of patchy enhancement involving the anterior right frontal cortex as above, indeterminate, but could reflect the sequelae of subacute ischemia. Correlation with brain MRI recommended. Electronically Signed   By: Jeannine Boga M.D.   On: 04/14/2018 01:58   Ct Head Wo Contrast  Result Date: 04/13/2018 CLINICAL DATA:  Altered LOC EXAM: CT HEAD WITHOUT CONTRAST TECHNIQUE: Contiguous axial images were obtained from the base of the skull through the vertex without intravenous contrast. COMPARISON:  None. FINDINGS: Brain: No acute territorial infarction, hemorrhage or intracranial mass. Moderate atrophy. Moderate small vessel ischemic changes of the white matter. Hypodensities in the left thalamus, consistent with age indeterminate lacunar infarct. Prominent ventricles felt secondary to atrophy Vascular: No hyperdense vessels. Vertebral and carotid vascular calcification Skull: Normal. Negative for fracture or focal lesion. Sinuses/Orbits: No acute finding. Other: None IMPRESSION: 1. Small hypodensities in the left thalamus suggesting age indeterminate lacunar infarcts. Negative for acute intracranial hemorrhage. 2. Atrophy and small vessel ischemic changes of the white matter. Electronically Signed   By: Donavan Foil M.D.   On: 04/13/2018 21:07   Ct Angio Neck W Or Wo Contrast  Result Date: 04/14/2018 CLINICAL DATA:  Initial evaluation for blurry vision, recent falls. EXAM: CT ANGIOGRAPHY HEAD AND NECK TECHNIQUE: Multidetector CT imaging of the head and neck was performed using the standard protocol during bolus administration of intravenous contrast. Multiplanar CT image reconstructions and MIPs were obtained to evaluate the vascular anatomy. Carotid stenosis measurements (when applicable) are obtained utilizing NASCET criteria, using the distal internal carotid diameter as the denominator. CONTRAST:  35mL OMNIPAQUE IOHEXOL  350 MG/ML SOLN  COMPARISON:  Prior head CT from 04/13/2018 FINDINGS: CTA NECK FINDINGS Aortic arch: Partially visualized aortic arch of normal caliber with normal branch pattern. Moderate atherosclerotic change about the aortic arch and origin of the great vessels. No definite flow-limiting stenosis, although origin of the great vessels incompletely visualized. Visualized subclavian arteries widely patent. Right carotid system: Right common carotid artery patent from its origin to the bifurcation. Concentric calcified plaque about the right bifurcation/proximal right ICA with relatively mild 25 narrowing by NASCET criteria. Right ICA widely patent distally to the skull base without stenosis or other vascular abnormality. Left carotid system: Visualized left common carotid artery patent from its origin to the bifurcation without stenosis. Scattered calcified plaque about the left bifurcation/proximal left ICA with associated stenosis of up to 50% by NASCET criteria. Left ICA mildly tortuous but otherwise widely patent to the skull base without stenosis or other vascular abnormality. Vertebral arteries: Both of the vertebral arteries arise from the subclavian arteries. Mixed plaque at the origin of the right vertebral artery with associated severe stenosis (series 7, image 299). Vertebral arteries otherwise patent within the neck without stenosis, dissection, or occlusion. Left vertebral artery dominant. Skeleton: No acute osseous finding. Moderate multilevel cervical spondylolysis. No discrete osseous lesions. Patient is edentulous. Other neck: Thyroid heterogeneous with multifocal predominantly subcentimeter nodules, of doubtful significance. No other acute soft tissue abnormality within the neck. Upper chest: 7 mm mildly spiculated nodule at the right lung apex (series 5, image 19). Review of the MIP images confirms the above findings CTA HEAD FINDINGS Anterior circulation: Petrous segments widely patent. Diffuse atheromatous  plaque throughout the cavernous/supraclinoid ICAs with resultant mild to moderate diffuse narrowing (no more than 50%). ICA termini well perfused. A1 segments patent bilaterally. Normal anterior communicating artery. Anterior cerebral arteries widely patent to their distal aspects. M1 segments widely patent. Distal MCA branches well perfused and symmetric. Posterior circulation: Both vertebral arteries widely patent as it crosses into the cranial vault. Left vertebral artery dominant. Atheromatous plaque with resultant moderate multifocal stenoses seen involving the dominant left V4 segment. On the right, scattered atheromatous plaque within the distal right V4 segment results in moderate to severe multifocal segmental stenoses (series 7, image 138). Posterior inferior cerebral arteries patent bilaterally. Basilar widely patent to its distal aspect. Superior cerebral arteries patent bilaterally. Both of the posterior cerebral arteries primarily supplied via the basilar. Right PCA widely patent to its distal aspect. Focal severe left P1 stenosis (series 8, image 103). Additional moderate to severe tandem stenoses seen involving the mid-distal left P2 segment. Left PCA is perfused to its distal aspect. Venous sinuses: Patent. Anatomic variants: None significant. Delayed phase: Small focus of patchy enhancement seen involving the cortical gray matter and underlying subcortical white matter at the anterior right frontal lobe (series 13, image 25, 24), indeterminate. No definite discrete mass within this region. Review of the MIP images confirms the above findings IMPRESSION: 1. Negative CTA for large vessel occlusion. 2. Multifocal moderate to severe left P1 and P2 stenoses without vascular occlusion. 3. Severe atheromatous stenosis at the origin of the right vertebral artery, with additional moderate to severe bilateral V4 stenoses as above, right worse than left. Atheromatous stenoses about the carotid bifurcations  with up to 50% narrowing on the left and mild 25% narrowing on the right. 4. Moderate atherosclerotic change throughout the carotid siphons with associated moderate diffuse narrowing. 5. Small focus of patchy enhancement involving the anterior right frontal cortex as above, indeterminate, but  could reflect the sequelae of subacute ischemia. Correlation with brain MRI recommended. Electronically Signed   By: Jeannine Boga M.D.   On: 04/14/2018 01:58   Mr Brain Wo Contrast  Result Date: 04/14/2018 CLINICAL DATA:  Initial evaluation for intermittent dizziness for 1 week, ataxia, blurry vision. Evaluate for stroke. EXAM: MRI HEAD WITHOUT CONTRAST TECHNIQUE: Multiplanar, multiecho pulse sequences of the brain and surrounding structures were obtained without intravenous contrast. COMPARISON:  Prior CT and CTA from earlier the same day as well as 04/13/2018. FINDINGS: Brain: Diffuse prominence of the CSF containing spaces compatible with generalized age-related cerebral atrophy. Patchy and confluent T2/FLAIR hyperintensity within the periventricular deep white matter both cerebral hemispheres most consistent with chronic small vessel ischemic disease, moderate nature. Superimposed remote lacunar infarcts present within the bilateral thalami, left greater than right. Additional remote lacunar infarcts present at the bilateral basal ganglia. 11 mm focus of restricted diffusion at the medial left thalamus, compatible with acute ischemic infarct. No associated hemorrhage or mass effect. Slight extension into the left cerebral peduncle/midbrain. Few additional punctate foci of diffusion abnormality seen involving the subcortical right parietal lobe (series 5, image 82) and left occipital lobe (series 5, image 63), also consistent with acute to early subacute ischemic infarcts. Additional late subacute ischemic infarcts seen involving the anterior right frontal cortical gray matter and underlying white matter,  corresponding with patchy enhancement seen on prior CTA (series 5, image 80, 78). Associated mild petechial in hemorrhage at this site (series 14, image 43). No other evidence for acute or subacute ischemia. No other acute or chronic intracranial hemorrhage. No mass lesion, midline shift or mass effect. No hydrocephalus. No extra-axial fluid collection. Pituitary gland suprasellar region normal. Midline structures intact. Vascular: Major intravascular flow voids maintained. Skull and upper cervical spine: Craniocervical junction within normal limits. Visualized upper cervical spine normal. Bone marrow signal intensity within normal limits. No scalp soft tissue abnormality. Sinuses/Orbits: Patient status post bilateral ocular lens replacement. Axial myopia noted. Mucosal thickening with few small retention cyst noted within the maxillary sinuses. Paranasal sinuses are otherwise clear. Small bilateral mastoid effusions, likely benign/sterile. Inner ear structures grossly normal. Other: None. IMPRESSION: 1. 11 mm acute ischemic nonhemorrhagic left thalamic infarct, with slight inferior extension into the left cerebral peduncle/midbrain. 2. Few additional scattered acute to subacute infarcts involving the right frontal and parietal lobes as well as the left occipital lobe as above. There is associated petechial hemorrhage about the subacute right frontal infarct, which corresponds with patchy enhancement seen on prior CTA. 3. Underlying atrophy with moderate chronic microvascular ischemic disease. Electronically Signed   By: Jeannine Boga M.D.   On: 04/14/2018 04:44    PHYSICAL EXAM Pleasant elderly Caucasian male not in distress. . Afebrile. Head is nontraumatic. Neck is supple without bruit.    Cardiac exam no murmur or gallop. Lungs are clear to auscultation. Distal pulses are well felt. Neurological Exam :  Awake alert oriented to time place and person. Normal speech and language. Follows commands well.  His slight drooping of his eyelids but no definite ptosis. Extraocular moments are full range with some saccadic dysmetria on right gaze. No diplopia subjectively. No restriction of vertical eye movements. Pupils equal reactive. Fundi not visualized. Face is symmetric without weakness. Tongue midline. Motor system exam symmetric upper and lower extremity strength without drift. No focal weakness. Deep tendon reflexes are 2+ symmetric. Plantars downgoing. Sensation intact. Heel to shin and knee to heel coordination is slow but accurate. Gait not tested.  ASSESSMENT/PLAN [name] is a 85 y.o. male with history of melena on aspirin, anemia, HTN, got, DB and hypothyroidism presenting with multiple falls, leaning to the R and double vision.   Stroke:  bilateral infarcts which appear mostly subcortical and lacunar in pt with hx recent melena. Workup underway  CT head small hypodensities L thalamus, ? Age. Small vessel disease. Atrophy.  CTA head & neck no LVO. Mod to severe L P1 and P2 stenosis. Severe R VA, R>L V4 stenosis. L ICA 50%, mild R 25% narrowing. ?  Patchy r frontal cortex inschemia  MRI  L thalamic infarct with extension into L cerebral peduncle/midbrain. Additional acute/subacute scattered infarcts R frontal, R parietal, L occipital lobes. Petechial hemorrhage R frontal. Small vessel disease. Atrophy.   2D Echo w/ bubble  pending   LDL 84  HgbA1c 7.9  Lovenox 40 mg sq daily for VTE prophylaxis  No antithrombotic prior to admission. Had been on aspirin 81 mg until 3 weeks ago but it was stopped d/t diarrhea and melenta. Therefore, on  No antithrombotics on admission. Given no melena at this time and HGB 12.2, feel benefit of starting aspirin 81 outweighs risk. Aspirin 81 mg added.  Therapy recommendations:  pending   Disposition:  pending   GI workup for melena recommended. May need to consider long-term AC if embolic source of stroke found.  Hypertension  Stable but  elevated 160-190s . Permissive hypertension (OK if < 220/120) but gradually normalize in 5-7 days . Long-term BP goal normotensive  Lipids  Home meds:  None  LDL 84, goal < 70  Added statin as LDL> 70 - lipitor 40  Diabetes type II, uncontrolled  HgbA1c 7.9, goal < 7.0  Other Stroke Risk Factors  Advanced age  ETOH use, advised to drink no more than 2 drink(s) a day  Other Active Problems  Normocytic anemia. Hx melena that started around Christmas  Gout  Hypothyroidism   Hospital day # 0  Burnetta Sabin, MSN, APRN, ANVP-BC, AGPCNP-BC Advanced Practice Stroke Nurse [place] for Schedule & Pager information 04/14/2018 12:39 PM  I have personally obtained history,examined this patient, reviewed notes, independently viewed imaging studies, participated in medical decision making and plan of care.ROS completed by me personally and pertinent positives fully documented  I have made any additions or clarifications directly to the above note. Agree with note above.He presented with diplopia and gait ataxia secondary to bicerebral subcortical infarcts Likely from small vessel disease. Recommend aspirin 81 mg alone given history of ongoing melena without definite documented source of bleeding. Aggressive risk factor modification. Discussed with Dr. Ree Kida. Greater than 50% time during this 35 minute visit was spent on counseling and coordination of care about his lacunar strokes and discussion about stroke prevention and treatment and answering questions. Antony Contras, MD Medical Director [facility] Pager: [phone] 04/14/2018 12:56 PM  To contact Stroke Continuity provider, please refer to [email or web address] After hours, contact General Neurology

## 2018-04-15 DIAGNOSIS — M109 Gout, unspecified: Secondary | ICD-10-CM

## 2018-04-15 LAB — CBC
HCT: 39.8 % (ref 39.0–52.0)
Hemoglobin: 13.4 g/dL (ref 13.0–17.0)
MCH: 30.8 pg (ref 26.0–34.0)
MCHC: 33.7 g/dL (ref 30.0–36.0)
MCV: 91.5 fL (ref 80.0–100.0)
NRBC: 0 % (ref 0.0–0.2)
PLATELETS: 191 10*3/uL (ref 150–400)
RBC: 4.35 MIL/uL (ref 4.22–5.81)
RDW: 12.3 % (ref 11.5–15.5)
WBC: 7.1 10*3/uL (ref 4.0–10.5)

## 2018-04-15 LAB — BASIC METABOLIC PANEL
Anion gap: 10 (ref 5–15)
BUN: 13 mg/dL (ref 8–23)
CO2: 23 mmol/L (ref 22–32)
Calcium: 9.7 mg/dL (ref 8.9–10.3)
Chloride: 104 mmol/L (ref 98–111)
Creatinine, Ser: 0.81 mg/dL (ref 0.61–1.24)
GFR calc Af Amer: >60 mL/min (ref >60)
GFR calc non Af Amer: >60 mL/min (ref >60)
GLUCOSE: 156 mg/dL — AB (ref 70–99)
Potassium: 3.7 mmol/L (ref 3.5–5.1)
Sodium: 137 mmol/L (ref 135–145)

## 2018-04-15 LAB — GLUCOSE, CAPILLARY
Glucose-Capillary: 138 mg/dL — ABNORMAL HIGH (ref 70–99)
Glucose-Capillary: 146 mg/dL — ABNORMAL HIGH (ref 70–99)
Glucose-Capillary: 136 mg/dL — ABNORMAL HIGH (ref 70–99)

## 2018-04-15 MED ORDER — ATENOLOL 25 MG PO TABS
50.0000 mg | ORAL_TABLET | Freq: Every day | ORAL | Status: DC
  Administered 2018-04-15: 50 mg via ORAL
  Filled 2018-04-15: qty 2

## 2018-04-15 MED ORDER — ASPIRIN 81 MG PO TBEC: 81.0000 mg | DELAYED_RELEASE_TABLET | Freq: Every day | ORAL | 0 refills | Status: DC

## 2018-04-15 MED ORDER — LOSARTAN POTASSIUM 25 MG PO TABS
25.0000 mg | ORAL_TABLET | Freq: Every day | ORAL | Status: DC
  Administered 2018-04-15: 25 mg via ORAL
  Filled 2018-04-15: qty 1

## 2018-04-15 MED ORDER — ATORVASTATIN CALCIUM 40 MG PO TABS: 40.0000 mg | ORAL_TABLET | Freq: Every day | ORAL | 0 refills | Status: DC

## 2018-04-15 MED ORDER — CLOPIDOGREL BISULFATE 75 MG PO TABS: 75.0000 mg | ORAL_TABLET | Freq: Every day | ORAL | 0 refills | Status: DC

## 2018-04-15 NOTE — Progress Notes (Signed)
Patient for discharge with home health PT/OT.  IV discontinued, telemetry removed.  AVS discussed with wife and son at his bedside.  Patient left for home with wife and son.  Patient is alert oriented to 4, comfable and welll upon discharge

## 2018-04-15 NOTE — Progress Notes (Signed)
STROKE TEAM PROGRESS NOTE   SUBJECTIVE (INTERVAL HISTORY) His wife is at the bedside.  Patient lying in bed, fully orientated.  Denies any heart palpitation or racing heart.  Stated that he had some melena while he took aspirin in the past.  Since aspirin stopped, there is no any lower GI bleeding recurrence.  Currently H&H stable, will start DAPT for stroke prevention.  Ask him and his wife to close watch for the GI bleeding at home.    OBJECTIVE Vitals:   04/15/18 0055 04/15/18 0450 04/15/18 0810 04/15/18 1206  BP: (!) 168/83 (!) 187/84 (!) 205/82 (!) 172/87  Pulse: (!) 56 62 60   Resp: 14 17 14    Temp: 97.6 F (36.4 C) 97.7 F (36.5 C) 97.7 F (36.5 C) 97.6 F (36.4 C)  TempSrc: Oral Oral Oral Oral  SpO2: 96% 96% 97% 99%  Weight:      Height:        CBC:  Recent Labs  Lab 04/13/18 2138 04/15/18 0347  WBC 6.5 7.1  HGB 12.2* 13.4  HCT 36.4* 39.8  MCV 95.0 91.5  PLT 182 119    Basic Metabolic Panel:  Recent Labs  Lab 04/13/18 2138 04/15/18 0347  NA 137 137  K 3.9 3.7  CL 107 104  CO2 22 23  GLUCOSE 170* 156*  BUN 23 13  CREATININE 0.90 0.81  CALCIUM 8.9 9.7    Lipid Panel:     Component Value Date/Time   CHOL 143 04/14/2018 0955   TRIG 98 04/14/2018 0955   HDL 39 (L) 04/14/2018 0955   CHOLHDL 3.7 04/14/2018 0955   VLDL 20 04/14/2018 0955   LDLCALC 84 04/14/2018 0955   HgbA1c:  Lab Results  Component Value Date   HGBA1C 7.9 (H) 04/14/2018   Urine Drug Screen: No results found for: LABOPIA, COCAINSCRNUR, LABBENZ, AMPHETMU, THCU, LABBARB  Alcohol Level No results found for: ETH  IMAGING  Ct Angio Head W Or Wo Contrast Ct Angio Neck W Or Wo Contrast 04/14/2018 IMPRESSION:  1. Negative CTA for large vessel occlusion.  2. Multifocal moderate to severe left P1 and P2 stenoses without vascular occlusion.  3. Severe atheromatous stenosis at the origin of the right vertebral artery, with additional moderate to severe bilateral V4 stenoses as above,  right worse than left. Atheromatous stenoses about the carotid bifurcations with up to 50% narrowing on the left and mild 25% narrowing on the right.  4. Moderate atherosclerotic change throughout the carotid siphons with associated moderate diffuse narrowing.  5. Small focus of patchy enhancement involving the anterior right frontal cortex as above, indeterminate, but could reflect the sequelae of subacute ischemia. Correlation with brain MRI recommended.    Ct Head Wo Contrast 04/13/2018 IMPRESSION:  1. Small hypodensities in the left thalamus suggesting age indeterminate lacunar infarcts. Negative for acute intracranial hemorrhage.  2. Atrophy and small vessel ischemic changes of the white matter.    Mr Brain Wo Contrast 04/14/2018 IMPRESSION:  1. 11 mm acute ischemic nonhemorrhagic left thalamic infarct, with slight inferior extension into the left cerebral peduncle/midbrain. 2. Few additional scattered acute to subacute infarcts involving the right frontal and parietal lobes as well as the left occipital lobe as above. There is associated petechial hemorrhage about the subacute right frontal infarct, which corresponds with patchy enhancement seen on prior CTA.  3. Underlying atrophy with moderate chronic microvascular ischemic disease.     Transthoracic Echocardiogram  04/14/2018 IMPRESSIONS  1. The left ventricle has normal systolic  function with an ejection fraction of 60-65%. The cavity size was normal. Left ventricular diastolic Doppler parameters are consistent with impaired relaxation.  2. The right ventricle has normal systolic function. The cavity was normal. There is no increase in right ventricular wall thickness.  3. Moderate sclerosis of the aortic valve. no stenosis of the aortic valve.  4. The inferior vena cava was normal in size with <50% respiratory variability.  5. No definite intracardiac source of emboli.  6. Suboptimal image quality, however no evidence of right to  left shunt with agitated saline injection in subcostal view. Negative contrast in RA does not exclude possible left to right shunt. No evidence of interatrial shunt by color flow Doppler.     PHYSICAL EXAM  Temp:  [97.5 F (36.4 C)-98.6 F (37 C)] 97.6 F (36.4 C) (03/21 1206) Pulse Rate:  [56-79] 60 (03/21 0810) Resp:  [14-18] 14 (03/21 0810) BP: (157-205)/(74-87) 172/87 (03/21 1206) SpO2:  [96 %-99 %] 99 % (03/21 1206)  General - Well nourished, well developed, in no apparent distress.  Ophthalmologic - fundi not visualized due to noncooperation.  Cardiovascular - Regular rate and rhythm.  Mental Status -  Level of arousal and orientation to time, place, and person were intact. Language including expression, naming, repetition, comprehension was assessed and found intact. Fund of Knowledge was assessed and was intact.  Cranial Nerves II - XII - II - Visual field intact OU. III, IV, VI - Extraocular movements intact. V - Facial sensation intact bilaterally. VII - Facial movement intact bilaterally. VIII - Hearing & vestibular intact bilaterally. X - Palate elevates symmetrically. XI - Chin turning & shoulder shrug intact bilaterally. XII - Tongue protrusion intact.  Motor Strength - The patient's strength was normal in all extremities and pronator drift was absent.  Bulk was normal and fasciculations were absent.   Motor Tone - Muscle tone was assessed at the neck and appendages and was normal.  Reflexes - The patient's reflexes were symmetrical in all extremities and he had no pathological reflexes.  Sensory - Light touch, temperature/pinprick were assessed and were symmetrical.    Coordination - The patient had normal movements in the hands with no ataxia or dysmetria.  Tremor was absent.  Gait and Station - deferred.    ASSESSMENT/PLAN Mr. Jawanza Zambito is a 85 y.o. male with history of type 2 diabetes mellitus, hypertension, hypothyroidism, melena presenting with  multiple falls, recent diplopia and listing to the right. He did not receive IV t-PA due to late presentation.  Stroke: Multiple bilateral infarcts including left thalamic, and scattered right frontal with small hemorrhagic conversion, right MCA/ACA and left MCA/PCA - embolic - source unknown  Resultant at baseline  CT head - Small hypodensities in the left thalamus suggesting age indeterminate lacunar infarcts.  MRI head - 11 mm acute ischemic nonhemorrhagic left thalamic infarct, with slight inferior extension into the left cerebral peduncle/midbrain. Few additional scattered acute to subacute infarcts involving the right frontal and parietal lobes as well as the left occipital lobe as above. There is associated petechial hemorrhage about the subacute right frontal infarct  CTA H&N - Multifocal moderate to severe left P1 and P2 stenoses without vascular occlusion. Severe atheromatous stenosis at the origin of the right vertebral artery, with additional moderate to severe bilateral V4 stenoses right worse than left. Left carotid bifurcations 50% narrowing.  2D Echo - EF 60 - 65%. No cardiac source of emboli identified.   Recommend 30-day CardioNet monitoring as outpatient  to rule out A. fib.  If it is negative, consider loop recorder for longer monitoring  LDL - 84  HgbA1c - 7.9  VTE prophylaxis - Lovenox  Diet - Heart healthy / carb modified with thin liquids.  No antithrombotic prior to admission, now on aspirin 81 mg daily and clopidogrel 75 mg daily.  Recommend DAPT for 3 weeks and then aspirin alone.  Patient counseled to be compliant with his antithrombotic medications  Ongoing aggressive stroke risk factor management  Therapy recommendations:  Outpatient PT and OT  Disposition:  Pending  Hypertension  Blood pressure somewhat high at times but within post stroke parameters. . Long-term BP goal normotensive . Agree with resume home BP meds prior to  discharge  Hyperlipidemia  Lipid lowering medication PTA:  none  LDL 84, goal < 70  Current lipid lowering medication: Lipitor 40 mg daily  Continue statin at discharge  Diabetes  HgbA1c 7.9, goal < 7.0  Uncontrolled  SSI  CBG monitoring  Close PCP follow-up for better DM control  Other Stroke Risk Factors  Advanced age  Hx stroke/TIA (by imaging)  Other Active Problems  Mild melena well on aspirin in the past   Hospital day # 1  Neurology will sign off. Please call with questions. Pt will follow up with stroke clinic NP at [facility] in about 4 weeks. Thanks for the consult.  Rosalin Hawking, MD PhD Stroke Neurology 04/15/2018 2:24 PM    To contact Stroke Continuity provider, please refer to [email or web address] After hours, contact General Neurology

## 2018-04-15 NOTE — Progress Notes (Signed)
Physical Therapy Treatment Patient Details Name: Alan Rubio MRN: [id number] DOB: 1931/07/05 Today's Date: 04/15/2018    History of Present Illness 85 y.o.malewith medical history significant oftype 2 diabetes, CA, hypertension, hypothyroidism presenting to the hospital with a4-day history of dizziness, double vision, ataxia and reports of multiple falls. MRI showed 11 mm acute ischemic nonhemorrhagic left thalamic infarct with slight inferior extension into the left cerebral peduncle/midbrain. Additional acute/subacute scattered infarcts R frontal, R parietal, L occipital lobes. Petechial hemorrhage R frontal.    PT Comments    Patient seen for mobility progression. Pt requires supervision/min guard overall for OOB this session. Pt with unsteady gait but no overt LOB. Recommending HHPT for further skilled PT services to maximize independence and safety with mobility.    Follow Up Recommendations  Supervision/Assistance - 24 hour;Home health PT     Equipment Recommendations  None recommended by PT    Recommendations for Other Services       Precautions / Restrictions Precautions Precautions: Fall Precaution Comments: hx of 2 falls day of admission Restrictions Weight Bearing Restrictions: No    Mobility  Bed Mobility Overal bed mobility: Modified Independent                Transfers Overall transfer level: Modified independent Equipment used: None Transfers: Sit to/from Stand              Ambulation/Gait Ambulation/Gait assistance: Min guard Gait Distance (Feet): 220 Feet Assistive device: None(used cane for some of gait) Gait Pattern/deviations: Step-through pattern     General Gait Details: pt with unsteadiness but no overt LOB; discussed use of SPC with pt and family; cues for sequencing and safe use of AD   Stairs Stairs: Yes Stairs assistance: Min guard Stair Management: One rail Right;Step to pattern;Alternating pattern;Forwards Number of  Stairs: 10 General stair comments: cues for safety and step to pattern   Wheelchair Mobility    Modified Rankin (Stroke Patients Only) Modified Rankin (Stroke Patients Only) Pre-Morbid Rankin Score: No symptoms Modified Rankin: Moderate disability     Balance Overall balance assessment: Needs assistance;History of Falls Sitting-balance support: Feet supported Sitting balance-Leahy Scale: Fair     Standing balance support: No upper extremity supported;During functional activity Standing balance-Leahy Scale: Fair Standing balance comment: mild instabilities noted                            Cognition Arousal/Alertness: Awake/alert Behavior During Therapy: WFL for tasks assessed/performed Overall Cognitive Status: Within Functional Limits for tasks assessed                                 General Comments: WFL for simple tasks performed during session      Exercises      General Comments        Pertinent Vitals/Pain Pain Assessment: No/denies pain    Home Living                      Prior Function            PT Goals (current goals can now be found in the care plan section) Acute Rehab PT Goals Patient Stated Goal: return home Progress towards PT goals: Progressing toward goals    Frequency    Min 3X/week      PT Plan Discharge plan needs to be updated    Co-evaluation  AM-PAC PT "6 Clicks" Mobility   Outcome Measure  Help needed turning from your back to your side while in a flat bed without using bedrails?: None Help needed moving from lying on your back to sitting on the side of a flat bed without using bedrails?: None Help needed moving to and from a bed to a chair (including a wheelchair)?: A Little Help needed standing up from a chair using your arms (e.g., wheelchair or bedside chair)?: A Little Help needed to walk in hospital room?: A Little Help needed climbing 3-5 steps with a railing?  : A Little 6 Click Score: 20    End of Session Equipment Utilized During Treatment: Gait belt Activity Tolerance: Patient tolerated treatment well Patient left: with call bell/phone within reach;with family/visitor present(sitting EOB) Nurse Communication: Mobility status PT Visit Diagnosis: Unsteadiness on feet (R26.81);Other abnormalities of gait and mobility (R26.89);Other symptoms and signs involving the nervous system (R29.898)     Time: 1245-8099 PT Time Calculation (min) (ACUTE ONLY): 18 min  Charges:  $Gait Training: 8-22 mins                     Earney Navy, PTA Acute Rehabilitation Services Pager: [phone] Office: [phone]     Darliss Cheney 04/15/2018, 2:35 PM

## 2018-04-15 NOTE — Discharge Instructions (Signed)
Follow up with PCP in 1-2 weeks.  Check BP at home, take BP meds as prescribed, BP goal normal range, 120-140/70-90 Follow up with PCP to get better glucose control.  Will arrange for heart monitoring to rule out irregular heart beat. Self observation of bleeding per rectum after starting mild blood thinner, if started to have GI tract bleeding, discuss with your PCP to see if blood thinner needs to be stopped and if GI doctor needs to see you.  Neurology office will call you for appointment within 4 weeks.

## 2018-04-15 NOTE — Discharge Summary (Signed)
Physician Discharge Summary  Alan Rubio UVO:[id number] DOB: 03-04-1931 DOA: 04/13/2018  PCP: [name], MD  Admit date: 04/13/2018 Discharge date: 04/15/2018  Time spent: 45 minutes  Recommendations for Outpatient Follow-up:  Patient will be discharged to home with  physical and occupational therapy.  Patient will need to follow up with primary care provider within one week of discharge.  Follow up with neurology. Patient should continue medications as prescribed.  Patient should follow a heart healthy/carb modified diet.   Discharge Diagnoses:  Principal Problem: CVA (cerebral vascular accident) ([place]) Normocytic anemia Essential Hypertension Gout Diabetes mellitus, type II Hypothyroidism  Discharge Condition: Stable  Diet recommendation: Heart healthy/carb modified  Filed Weights   04/13/18 1937 04/13/18 1947 04/14/18 0300  Weight: 82.6 kg 78 kg 80 kg    History of present illness:  On 04/13/2018 by Dr. Shela Leff Tremell Jonesis a 85 y.o.malewith medical history significant oftype 2 diabetes, hypertension, hypothyroidism presenting to the hospital with a4-day history of dizziness, double vision, and ataxia. States he has double vision when looking withboth eyes or just his right eye. When looking with his left eye only he does not have double vision. States when trying to walk he is falling to his right side. Wife at bedside is concerned that patient has had 2 recent falls. He did not hit his head or sustain any injuries from the falls. His symptoms have been persistent. Denies any history of prior stroke. He does not smoke cigarettes. States he was previously taking a baby aspirin daily but quit taking it 3 weeks ago as it was causing him to have diarrhea and dark stools. His dark stools resolved after he stopped aspirin. Denies any melena or hematochezia at present. States his cholesterol has always been low on labs checked previously.  Hospital Course:    Acute CVA -Patient presented with dizziness, ataxia, double vision.   -Head CT: Small hypodensities left hypothalamus suggesting age-indeterminate lacunar infarcts.  Negative for acute intracranial hemorrhage. -MRI showed 11 mm acute ischemic nonhemorrhagic left thalamic infarct with slight inferior extension into the left cerebral peduncle/midbrain.  Few additional scattered acute to subacute infarcts involving right frontal and parietal lobes as well as left occipital lobe.  Associated petechial hemorrhage about the subacute right frontal infarct.  -CTA head and neck: Negative CTA for large vessel occlusion.  Multifocal moderate to severe left P1 and P2 stenosis without vascular occlusion.  Severe atheromatous stenosis at the origin of the right vertebral artery.  Atheromatous stenosis about the carotid bifurcations up to 50% narrowing on the left, mild 25% narrowing on the right. -Hemoglobin A1c 7.9, LDL 84 -Neurology consulted and appreciated-Aspirin and plavix added- continue for 3 weeks and then aspirin alone. 30 day monitor (Neurology sent message to EP) -Echocardiogram with bubble study: Suboptimal image quality, no evidence of right to left shunt with agitated saline injection.  No evidence of interarterial shunt by color flow Doppler -Speech therapy evaluated patient, no further speech therapy needs -PT/OT commending outpatient physical and Occupational Therapy- however given that patient's  -continue statin  Normocytic anemia -Patient denies melena or hematochezia present time -Patient states he is never had a colonoscopy with gastroenterologist -Hemoglobin currently stable-currently 13.4 -Patient states that he was having darker stools and diarrhea when he was on his aspirin however at this subsided once he stopped taking aspirin. -Anemia panel: Within normal limits -FOBT negative  Essential Hypertension -Continue home medications on discharge  Gout -Continue  allopurinol  Diabetes mellitus, type II -hemoglobin A1c as  above -CBGs controlled -Continue Actos on discharge -patient will need to follow up with Dr. Buddy Duty, endocrinology, to discuss DM control  Hypothyroidism -Continue Synthroid  Procedures: Echocardiogram with complete bubble study  Consultations: Neurology  Discharge Exam: Vitals:   04/15/18 0810 04/15/18 1206  BP: (!) 205/82 (!) 172/87  Pulse: 60   Resp: 14   Temp: 97.7 F (36.5 C) 97.6 F (36.4 C)  SpO2: 97% 99%     General: Well developed, well nourished, NAD, appears stated age  HEENT: NCAT, mucous membranes moist.  Cardiovascular: S1 S2 auscultated, RRR  Respiratory: Clear to auscultation bilaterally with equal chest rise  Abdomen: Soft, nontender, nondistended, + bowel sounds  Extremities: warm dry without cyanosis clubbing or edema  Neuro: AAOx3, nonfocal  Psych: Pleasant, appropriate mood and affect  Discharge Instructions Discharge Instructions    Discharge instructions   Complete by:  As directed    Patient will be discharged to home with outpatient physical and occupational therapy.  Patient will need to follow up with primary care provider within one week of discharge and discuss gastroenterology referral. Follow up with neurology. Follow up Dr. Buddy Duty and discuss diet and diabetes management. Patient should continue medications as prescribed.  Patient should follow a heart healthy/carb modified diet.  Continue plavix and aspirin for 3 weeks, followed by aspirin alone.   Face-to-face encounter (required for Medicare/Medicaid patients)   Complete by:  As directed    I Cristal Ford certify that this patient is under my care and that I, or a nurse practitioner or physician's assistant working with me, had a face-to-face encounter that meets the physician face-to-face encounter requirements with this patient on 04/15/2018. The encounter with the patient was in whole, or in part for the following  medical condition(s) which is the primary reason for home health care (List medical condition): Acute stroke with dizziness, double vision   The encounter with the patient was in whole, or in part, for the following medical condition, which is the primary reason for home health care:  Acute stroke   I certify that, based on my findings, the following services are medically necessary home health services:  Physical therapy   Reason for Medically Necessary Home Health Services:  Therapy- Therapeutic Exercises to Increase Strength and Endurance   My clinical findings support the need for the above services:  Unable to leave home safely without assistance and/or assistive device   Further, I certify that my clinical findings support that this patient is homebound due to:  Unable to leave home safely without assistance   Home Health   Complete by:  As directed    To provide the following care/treatments:   PT OT       Allergies as of 04/15/2018      Reactions   Penicillins Rash   Did it involve swelling of the face/tongue/throat, SOB, or low BP? Yes Did it involve sudden or severe rash/hives, skin peeling, or any reaction on the inside of your mouth or nose? No Did you need to seek medical attention at a hospital or doctor's office? No When did it last happen? If all above answers are "NO", may proceed with cephalosporin use.      Medication List    TAKE these medications   allopurinol 300 MG tablet Commonly known as:  ZYLOPRIM Take 300 mg by mouth daily.   aspirin 81 MG EC tablet Take 1 tablet (81 mg total) by mouth daily. Start taking on:  April 16, 2018  atenolol 50 MG tablet Commonly known as:  TENORMIN Take 50 mg by mouth daily.   atorvastatin 40 MG tablet Commonly known as:  LIPITOR Take 1 tablet (40 mg total) by mouth daily at 6 PM.   clopidogrel 75 MG tablet Commonly known as:  PLAVIX Take 1 tablet (75 mg total) by mouth daily. Start taking on:  April 16, 2018    doxazosin 8 MG tablet Commonly known as:  CARDURA Take 8 mg by mouth daily.   levothyroxine 75 MCG tablet Commonly known as:  SYNTHROID, LEVOTHROID Take 75 mcg by mouth daily before breakfast.   losartan 25 MG tablet Commonly known as:  COZAAR Take 25 mg by mouth daily.   pioglitazone 15 MG tablet Commonly known as:  ACTOS Take 15 mg by mouth daily.   Systane 0.4-0.3 % Soln Generic drug:  Polyethyl Glycol-Propyl Glycol Place 1 drop into both eyes 3 (three) times daily as needed (dry eyes).      Allergies  Allergen Reactions   Penicillins Rash    Did it involve swelling of the face/tongue/throat, SOB, or low BP? Yes Did it involve sudden or severe rash/hives, skin peeling, or any reaction on the inside of your mouth or nose? No Did you need to seek medical attention at a hospital or doctor's office? No When did it last happen? If all above answers are "NO", may proceed with cephalosporin use.      Follow-up Information    [name], MD. Schedule an appointment as soon as possible for a visit in 1 week(s).   Specialty:  Family Medicine Why:  Hospital follow up Contact information: [address] [place] [address] [phone]        [facility]. Schedule an appointment as soon as possible for a visit in 4 week(s).   Specialty:  Neurology Why:  Hospital follow up, stroke clinic Contact information: [address] [place] [phone]           The results of significant diagnostics from this hospitalization (including imaging, microbiology, ancillary and laboratory) are listed below for reference.    Significant Diagnostic Studies: Ct Angio Head W Or Wo Contrast  Result Date: 04/14/2018 CLINICAL DATA:  Initial evaluation for blurry vision, recent falls. EXAM: CT ANGIOGRAPHY HEAD AND NECK TECHNIQUE: Multidetector CT imaging of the head and neck was performed using the standard  protocol during bolus administration of intravenous contrast. Multiplanar CT image reconstructions and MIPs were obtained to evaluate the vascular anatomy. Carotid stenosis measurements (when applicable) are obtained utilizing NASCET criteria, using the distal internal carotid diameter as the denominator. CONTRAST:  87mL OMNIPAQUE IOHEXOL 350 MG/ML SOLN COMPARISON:  Prior head CT from 04/13/2018 FINDINGS: CTA NECK FINDINGS Aortic arch: Partially visualized aortic arch of normal caliber with normal branch pattern. Moderate atherosclerotic change about the aortic arch and origin of the great vessels. No definite flow-limiting stenosis, although origin of the great vessels incompletely visualized. Visualized subclavian arteries widely patent. Right carotid system: Right common carotid artery patent from its origin to the bifurcation. Concentric calcified plaque about the right bifurcation/proximal right ICA with relatively mild 25 narrowing by NASCET criteria. Right ICA widely patent distally to the skull base without stenosis or other vascular abnormality. Left carotid system: Visualized left common carotid artery patent from its origin to the bifurcation without stenosis. Scattered calcified plaque about the left bifurcation/proximal left ICA with associated stenosis of up to 50% by NASCET criteria. Left ICA  mildly tortuous but otherwise widely patent to the skull base without stenosis or other vascular abnormality. Vertebral arteries: Both of the vertebral arteries arise from the subclavian arteries. Mixed plaque at the origin of the right vertebral artery with associated severe stenosis (series 7, image 299). Vertebral arteries otherwise patent within the neck without stenosis, dissection, or occlusion. Left vertebral artery dominant. Skeleton: No acute osseous finding. Moderate multilevel cervical spondylolysis. No discrete osseous lesions. Patient is edentulous. Other neck: Thyroid heterogeneous with multifocal  predominantly subcentimeter nodules, of doubtful significance. No other acute soft tissue abnormality within the neck. Upper chest: 7 mm mildly spiculated nodule at the right lung apex (series 5, image 19). Review of the MIP images confirms the above findings CTA HEAD FINDINGS Anterior circulation: Petrous segments widely patent. Diffuse atheromatous plaque throughout the cavernous/supraclinoid ICAs with resultant mild to moderate diffuse narrowing (no more than 50%). ICA termini well perfused. A1 segments patent bilaterally. Normal anterior communicating artery. Anterior cerebral arteries widely patent to their distal aspects. M1 segments widely patent. Distal MCA branches well perfused and symmetric. Posterior circulation: Both vertebral arteries widely patent as it crosses into the cranial vault. Left vertebral artery dominant. Atheromatous plaque with resultant moderate multifocal stenoses seen involving the dominant left V4 segment. On the right, scattered atheromatous plaque within the distal right V4 segment results in moderate to severe multifocal segmental stenoses (series 7, image 138). Posterior inferior cerebral arteries patent bilaterally. Basilar widely patent to its distal aspect. Superior cerebral arteries patent bilaterally. Both of the posterior cerebral arteries primarily supplied via the basilar. Right PCA widely patent to its distal aspect. Focal severe left P1 stenosis (series 8, image 103). Additional moderate to severe tandem stenoses seen involving the mid-distal left P2 segment. Left PCA is perfused to its distal aspect. Venous sinuses: Patent. Anatomic variants: None significant. Delayed phase: Small focus of patchy enhancement seen involving the cortical gray matter and underlying subcortical white matter at the anterior right frontal lobe (series 13, image 25, 24), indeterminate. No definite discrete mass within this region. Review of the MIP images confirms the above findings  IMPRESSION: 1. Negative CTA for large vessel occlusion. 2. Multifocal moderate to severe left P1 and P2 stenoses without vascular occlusion. 3. Severe atheromatous stenosis at the origin of the right vertebral artery, with additional moderate to severe bilateral V4 stenoses as above, right worse than left. Atheromatous stenoses about the carotid bifurcations with up to 50% narrowing on the left and mild 25% narrowing on the right. 4. Moderate atherosclerotic change throughout the carotid siphons with associated moderate diffuse narrowing. 5. Small focus of patchy enhancement involving the anterior right frontal cortex as above, indeterminate, but could reflect the sequelae of subacute ischemia. Correlation with brain MRI recommended. Electronically Signed   By: Jeannine Boga M.D.   On: 04/14/2018 01:58   Ct Head Wo Contrast  Result Date: 04/13/2018 CLINICAL DATA:  Altered LOC EXAM: CT HEAD WITHOUT CONTRAST TECHNIQUE: Contiguous axial images were obtained from the base of the skull through the vertex without intravenous contrast. COMPARISON:  None. FINDINGS: Brain: No acute territorial infarction, hemorrhage or intracranial mass. Moderate atrophy. Moderate small vessel ischemic changes of the white matter. Hypodensities in the left thalamus, consistent with age indeterminate lacunar infarct. Prominent ventricles felt secondary to atrophy Vascular: No hyperdense vessels. Vertebral and carotid vascular calcification Skull: Normal. Negative for fracture or focal lesion. Sinuses/Orbits: No acute finding. Other: None IMPRESSION: 1. Small hypodensities in the left thalamus suggesting age indeterminate lacunar infarcts. Negative  for acute intracranial hemorrhage. 2. Atrophy and small vessel ischemic changes of the white matter. Electronically Signed   By: Donavan Foil M.D.   On: 04/13/2018 21:07   Ct Angio Neck W Or Wo Contrast  Result Date: 04/14/2018 CLINICAL DATA:  Initial evaluation for blurry vision,  recent falls. EXAM: CT ANGIOGRAPHY HEAD AND NECK TECHNIQUE: Multidetector CT imaging of the head and neck was performed using the standard protocol during bolus administration of intravenous contrast. Multiplanar CT image reconstructions and MIPs were obtained to evaluate the vascular anatomy. Carotid stenosis measurements (when applicable) are obtained utilizing NASCET criteria, using the distal internal carotid diameter as the denominator. CONTRAST:  80mL OMNIPAQUE IOHEXOL 350 MG/ML SOLN COMPARISON:  Prior head CT from 04/13/2018 FINDINGS: CTA NECK FINDINGS Aortic arch: Partially visualized aortic arch of normal caliber with normal branch pattern. Moderate atherosclerotic change about the aortic arch and origin of the great vessels. No definite flow-limiting stenosis, although origin of the great vessels incompletely visualized. Visualized subclavian arteries widely patent. Right carotid system: Right common carotid artery patent from its origin to the bifurcation. Concentric calcified plaque about the right bifurcation/proximal right ICA with relatively mild 25 narrowing by NASCET criteria. Right ICA widely patent distally to the skull base without stenosis or other vascular abnormality. Left carotid system: Visualized left common carotid artery patent from its origin to the bifurcation without stenosis. Scattered calcified plaque about the left bifurcation/proximal left ICA with associated stenosis of up to 50% by NASCET criteria. Left ICA mildly tortuous but otherwise widely patent to the skull base without stenosis or other vascular abnormality. Vertebral arteries: Both of the vertebral arteries arise from the subclavian arteries. Mixed plaque at the origin of the right vertebral artery with associated severe stenosis (series 7, image 299). Vertebral arteries otherwise patent within the neck without stenosis, dissection, or occlusion. Left vertebral artery dominant. Skeleton: No acute osseous finding. Moderate  multilevel cervical spondylolysis. No discrete osseous lesions. Patient is edentulous. Other neck: Thyroid heterogeneous with multifocal predominantly subcentimeter nodules, of doubtful significance. No other acute soft tissue abnormality within the neck. Upper chest: 7 mm mildly spiculated nodule at the right lung apex (series 5, image 19). Review of the MIP images confirms the above findings CTA HEAD FINDINGS Anterior circulation: Petrous segments widely patent. Diffuse atheromatous plaque throughout the cavernous/supraclinoid ICAs with resultant mild to moderate diffuse narrowing (no more than 50%). ICA termini well perfused. A1 segments patent bilaterally. Normal anterior communicating artery. Anterior cerebral arteries widely patent to their distal aspects. M1 segments widely patent. Distal MCA branches well perfused and symmetric. Posterior circulation: Both vertebral arteries widely patent as it crosses into the cranial vault. Left vertebral artery dominant. Atheromatous plaque with resultant moderate multifocal stenoses seen involving the dominant left V4 segment. On the right, scattered atheromatous plaque within the distal right V4 segment results in moderate to severe multifocal segmental stenoses (series 7, image 138). Posterior inferior cerebral arteries patent bilaterally. Basilar widely patent to its distal aspect. Superior cerebral arteries patent bilaterally. Both of the posterior cerebral arteries primarily supplied via the basilar. Right PCA widely patent to its distal aspect. Focal severe left P1 stenosis (series 8, image 103). Additional moderate to severe tandem stenoses seen involving the mid-distal left P2 segment. Left PCA is perfused to its distal aspect. Venous sinuses: Patent. Anatomic variants: None significant. Delayed phase: Small focus of patchy enhancement seen involving the cortical gray matter and underlying subcortical white matter at the anterior right frontal lobe (series 13,  image  25, 24), indeterminate. No definite discrete mass within this region. Review of the MIP images confirms the above findings IMPRESSION: 1. Negative CTA for large vessel occlusion. 2. Multifocal moderate to severe left P1 and P2 stenoses without vascular occlusion. 3. Severe atheromatous stenosis at the origin of the right vertebral artery, with additional moderate to severe bilateral V4 stenoses as above, right worse than left. Atheromatous stenoses about the carotid bifurcations with up to 50% narrowing on the left and mild 25% narrowing on the right. 4. Moderate atherosclerotic change throughout the carotid siphons with associated moderate diffuse narrowing. 5. Small focus of patchy enhancement involving the anterior right frontal cortex as above, indeterminate, but could reflect the sequelae of subacute ischemia. Correlation with brain MRI recommended. Electronically Signed   By: Jeannine Boga M.D.   On: 04/14/2018 01:58   Mr Brain Wo Contrast  Result Date: 04/14/2018 CLINICAL DATA:  Initial evaluation for intermittent dizziness for 1 week, ataxia, blurry vision. Evaluate for stroke. EXAM: MRI HEAD WITHOUT CONTRAST TECHNIQUE: Multiplanar, multiecho pulse sequences of the brain and surrounding structures were obtained without intravenous contrast. COMPARISON:  Prior CT and CTA from earlier the same day as well as 04/13/2018. FINDINGS: Brain: Diffuse prominence of the CSF containing spaces compatible with generalized age-related cerebral atrophy. Patchy and confluent T2/FLAIR hyperintensity within the periventricular deep white matter both cerebral hemispheres most consistent with chronic small vessel ischemic disease, moderate nature. Superimposed remote lacunar infarcts present within the bilateral thalami, left greater than right. Additional remote lacunar infarcts present at the bilateral basal ganglia. 11 mm focus of restricted diffusion at the medial left thalamus, compatible with acute  ischemic infarct. No associated hemorrhage or mass effect. Slight extension into the left cerebral peduncle/midbrain. Few additional punctate foci of diffusion abnormality seen involving the subcortical right parietal lobe (series 5, image 82) and left occipital lobe (series 5, image 63), also consistent with acute to early subacute ischemic infarcts. Additional late subacute ischemic infarcts seen involving the anterior right frontal cortical gray matter and underlying white matter, corresponding with patchy enhancement seen on prior CTA (series 5, image 80, 78). Associated mild petechial in hemorrhage at this site (series 14, image 43). No other evidence for acute or subacute ischemia. No other acute or chronic intracranial hemorrhage. No mass lesion, midline shift or mass effect. No hydrocephalus. No extra-axial fluid collection. Pituitary gland suprasellar region normal. Midline structures intact. Vascular: Major intravascular flow voids maintained. Skull and upper cervical spine: Craniocervical junction within normal limits. Visualized upper cervical spine normal. Bone marrow signal intensity within normal limits. No scalp soft tissue abnormality. Sinuses/Orbits: Patient status post bilateral ocular lens replacement. Axial myopia noted. Mucosal thickening with few small retention cyst noted within the maxillary sinuses. Paranasal sinuses are otherwise clear. Small bilateral mastoid effusions, likely benign/sterile. Inner ear structures grossly normal. Other: None. IMPRESSION: 1. 11 mm acute ischemic nonhemorrhagic left thalamic infarct, with slight inferior extension into the left cerebral peduncle/midbrain. 2. Few additional scattered acute to subacute infarcts involving the right frontal and parietal lobes as well as the left occipital lobe as above. There is associated petechial hemorrhage about the subacute right frontal infarct, which corresponds with patchy enhancement seen on prior CTA. 3. Underlying  atrophy with moderate chronic microvascular ischemic disease. Electronically Signed   By: Jeannine Boga M.D.   On: 04/14/2018 04:44    Microbiology: No results found for this or any previous visit (from the past 240 hour(s)).   Labs: Basic Metabolic Panel: Recent Labs  Lab 04/13/18 2138 04/15/18 0347  NA 137 137  K 3.9 3.7  CL 107 104  CO2 22 23  GLUCOSE 170* 156*  BUN 23 13  CREATININE 0.90 0.81  CALCIUM 8.9 9.7   Liver Function Tests: Recent Labs  Lab 04/13/18 2138  AST 29  ALT 22  ALKPHOS 43  BILITOT 0.6  PROT 6.5  ALBUMIN 3.8   No results for input(s): LIPASE, AMYLASE in the last 168 hours. No results for input(s): AMMONIA in the last 168 hours. CBC: Recent Labs  Lab 04/13/18 2138 04/15/18 0347  WBC 6.5 7.1  HGB 12.2* 13.4  HCT 36.4* 39.8  MCV 95.0 91.5  PLT 182 191   Cardiac Enzymes: No results for input(s): CKTOTAL, CKMB, CKMBINDEX, TROPONINI in the last 168 hours. BNP: BNP (last 3 results) No results for input(s): BNP in the last 8760 hours.  ProBNP (last 3 results) No results for input(s): PROBNP in the last 8760 hours.  CBG: Recent Labs  Lab 04/14/18 1644 04/14/18 2122 04/15/18 0625 04/15/18 0808 04/15/18 1111  GLUCAP 142* 114* 138* 146* 136*       Signed:  Beauty Pless  [facility] 04/15/2018, 2:22 PM

## 2018-04-15 NOTE — Progress Notes (Addendum)
Occupational Therapy Treatment Patient Details Name: Alan Rubio MRN: [id number] DOB: 10/17/1931 Today's Date: 04/15/2018    History of present illness 85 y.o.malewith medical history significant oftype 2 diabetes, CA, hypertension, hypothyroidism presenting to the hospital with a4-day history of dizziness, double vision, ataxia and reports of multiple falls. MRI showed 11 mm acute ischemic nonhemorrhagic left thalamic infarct with slight inferior extension into the left cerebral peduncle/midbrain. Additional acute/subacute scattered infarcts R frontal, R parietal, L occipital lobes. Petechial hemorrhage R frontal.   OT comments  Patient pleasant and cooperative, eager for planned dc home.  Patient demonstrates ability to complete basic transfers and mobility with min guard assist, practiced tub transfers with UE support stepping over threshold with min guard for safety; reviewed shower chair options, educated on possible stool to sit inside tub as patient reports having sliding glass doors, but encouraged to have home health therapies to assist with decision process if needed.  Attempted to assess functional cognition using pill box test, pt able to read all pill bottles without assist but required increased time to process and engage in tasks given multiple distractions and interruptions (Case Manager in to speak to wife about dc) during task therapist ended assessment early as assessment was not valid to be scored. Educated patient and family on recommendations of assistance with medication mgmt at this time.  DC plan updated to [place].  Will follow.    Follow Up Recommendations  Home health OT;Supervision/Assistance - 24 hour    Equipment Recommendations  Tub/shower seat    Recommendations for Other Services      Precautions / Restrictions Precautions Precautions: Fall Precaution Comments: hx of 2 falls day of admission Restrictions Weight Bearing Restrictions: No       Mobility Bed  Mobility Overal bed mobility: Modified Independent             General bed mobility comments: EOB upon entry   Transfers Overall transfer level: Modified independent Equipment used: None Transfers: Sit to/from Stand Sit to Stand: Min guard         General transfer comment: for general safety and immediate standing balance, no physical assist required    Balance Overall balance assessment: Needs assistance;History of Falls Sitting-balance support: Feet supported Sitting balance-Leahy Scale: Fair     Standing balance support: No upper extremity supported;During functional activity Standing balance-Leahy Scale: Fair Standing balance comment: mild instabilities noted                           ADL either performed or assessed with clinical judgement   ADL Overall ADL's : Needs assistance/impaired                         Toilet Transfer: Min guard;Ambulation Toilet Transfer Details (indicate cue type and reason): simulated in room     Tub/ Shower Transfer: Tub transfer;Min guard;Ambulation;Shower Scientist, research (medical) Details (indicate cue type and reason): reviewed safety with pt and family, using wall to support as pt steps over threshold; discussed options for seat in shower, reviewed safety with assistance in showering before having a seat  Functional mobility during ADLs: Min guard General ADL Comments: min guard for safety      Vision       Perception     Praxis      Cognition Arousal/Alertness: Awake/alert Behavior During Therapy: WFL for tasks assessed/performed Overall Cognitive Status: Impaired/Different from baseline Area of Impairment: Awareness;Problem solving;Following commands;Attention  Current Attention Level: Sustained   Following Commands: Follows one step commands with increased time;Follows multi-step commands inconsistently   Awareness: Emergent Problem Solving: Slow processing;Decreased  initiation;Requires verbal cues;Requires tactile cues General Comments: WFL for basic tasks, attempted pill box text with poor awareness, problem solving and executive functioning (did not score test, as therapist ended before pt completed due to distractions and interuptions)         Exercises     Shoulder Instructions       General Comments pt's spouse and son present during session    Pertinent Vitals/ Pain       Pain Assessment: No/denies pain  Home Living                                          Prior Functioning/Environment              Frequency  Min 2X/week        Progress Toward Goals  OT Goals(current goals can now be found in the care plan section)  Progress towards OT goals: Progressing toward goals  Acute Rehab OT Goals Patient Stated Goal: return home OT Goal Formulation: With patient Time For Goal Achievement: 04/28/18 Potential to Achieve Goals: Good  Plan Discharge plan needs to be updated;Frequency remains appropriate    Co-evaluation                 AM-PAC OT "6 Clicks" Daily Activity     Outcome Measure   Help from another person eating meals?: None Help from another person taking care of personal grooming?: None Help from another person toileting, which includes using toliet, bedpan, or urinal?: None Help from another person bathing (including washing, rinsing, drying)?: A Little Help from another person to put on and taking off regular upper body clothing?: None Help from another person to put on and taking off regular lower body clothing?: A Little 6 Click Score: 22    End of Session Equipment Utilized During Treatment: Gait belt  OT Visit Diagnosis: Unsteadiness on feet (R26.81);History of falling (Z91.81);Other symptoms and signs involving the nervous system (R29.898)   Activity Tolerance Patient tolerated treatment well   Patient Left with call bell/phone within reach;Other (comment);with  family/visitor present(seated EOB)   Nurse Communication Mobility status        Time: 4097-3532 OT Time Calculation (min): 29 min  Charges: OT General Charges $OT Visit: 1 Visit OT Treatments $Self Care/Home Management : 8-22 mins $Cognitive Funtion inital: Initial 15 mins  Delight Stare, [place] Pager [phone] Office [phone]    Delight Stare 04/15/2018, 3:14 PM

## 2018-04-15 NOTE — Care Management (Signed)
Spoke w patient and wife at bedside. Discussed dispo plan and HH. Discussed [facility] provider and Medicare quality ratings. Scheryl Darter, referral accepted. Discussed DME needs. They prefer to obtain shower chair vs tub bench after DC. No further CM needs identified.

## 2018-04-15 NOTE — Plan of Care (Signed)
  Problem: Education: Goal: Knowledge of General Education information will improve Description Including pain rating scale, medication(s)/side effects and non-pharmacologic comfort measures Outcome: Completed/Met   Problem: Health Behavior/Discharge Planning: Goal: Ability to manage health-related needs will improve Outcome: Completed/Met   Problem: Clinical Measurements: Goal: Ability to maintain clinical measurements within normal limits will improve Outcome: Completed/Met Goal: Will remain free from infection Outcome: Completed/Met Goal: Diagnostic test results will improve Outcome: Completed/Met Goal: Respiratory complications will improve Outcome: Completed/Met Goal: Cardiovascular complication will be avoided Outcome: Completed/Met   Problem: Activity: Goal: Risk for activity intolerance will decrease Outcome: Completed/Met   Problem: Nutrition: Goal: Adequate nutrition will be maintained Outcome: Completed/Met   Problem: Coping: Goal: Level of anxiety will decrease Outcome: Completed/Met   Problem: Elimination: Goal: Will not experience complications related to bowel motility Outcome: Completed/Met Goal: Will not experience complications related to urinary retention Outcome: Completed/Met   Problem: Pain Managment: Goal: General experience of comfort will improve Outcome: Completed/Met   Problem: Safety: Goal: Ability to remain free from injury will improve Outcome: Completed/Met   Problem: Skin Integrity: Goal: Risk for impaired skin integrity will decrease Outcome: Completed/Met   Problem: Education: Goal: Knowledge of disease or condition will improve Outcome: Completed/Met Goal: Knowledge of secondary prevention will improve Outcome: Completed/Met Goal: Knowledge of patient specific risk factors addressed and post discharge goals established will improve Outcome: Completed/Met Goal: Individualized Educational Video(s) Outcome: Completed/Met    Problem: Coping: Goal: Will verbalize positive feelings about self Outcome: Completed/Met Goal: Will identify appropriate support needs Outcome: Completed/Met   Problem: Health Behavior/Discharge Planning: Goal: Ability to manage health-related needs will improve Outcome: Completed/Met   Problem: Nutrition: Goal: Risk of aspiration will decrease Outcome: Completed/Met Goal: Dietary intake will improve Outcome: Completed/Met   Problem: Ischemic Stroke/TIA Tissue Perfusion: Goal: Complications of ischemic stroke/TIA will be minimized Outcome: Completed/Met   Problem: Spontaneous Subarachnoid Hemorrhage Tissue Perfusion: Goal: Complications of Spontaneous Subarachnoid Hemorrhage will be minimized Outcome: Completed/Met

## 2018-04-17 ENCOUNTER — Encounter (HOSPITAL_COMMUNITY): Payer: Self-pay | Admitting: Emergency Medicine

## 2018-04-17 ENCOUNTER — Emergency Department (HOSPITAL_COMMUNITY): Payer: Medicare Other

## 2018-04-17 ENCOUNTER — Emergency Department (HOSPITAL_COMMUNITY)
Admission: EM | Admit: 2018-04-17 | Discharge: 2018-04-17 | Disposition: A | Discharge status: Home / Self Care | Payer: Medicare Other | Attending: Emergency Medicine | Admitting: Emergency Medicine

## 2018-04-17 DIAGNOSIS — Z7982 Long term (current) use of aspirin: Secondary | ICD-10-CM | POA: Insufficient documentation

## 2018-04-17 DIAGNOSIS — Z79899 Other long term (current) drug therapy: Secondary | ICD-10-CM | POA: Insufficient documentation

## 2018-04-17 DIAGNOSIS — I959 Hypotension, unspecified: Secondary | ICD-10-CM | POA: Diagnosis not present

## 2018-04-17 DIAGNOSIS — E119 Type 2 diabetes mellitus without complications: Secondary | ICD-10-CM | POA: Insufficient documentation

## 2018-04-17 DIAGNOSIS — R531 Weakness: Secondary | ICD-10-CM | POA: Diagnosis not present

## 2018-04-17 DIAGNOSIS — E039 Hypothyroidism, unspecified: Secondary | ICD-10-CM | POA: Diagnosis not present

## 2018-04-17 DIAGNOSIS — J948 Other specified pleural conditions: Secondary | ICD-10-CM | POA: Diagnosis not present

## 2018-04-17 DIAGNOSIS — R55 Syncope and collapse: Secondary | ICD-10-CM | POA: Diagnosis not present

## 2018-04-17 LAB — CBC WITH DIFFERENTIAL/PLATELET
ABS IMMATURE GRANULOCYTES: 0.02 10*3/uL (ref 0.00–0.07)
Basophils Absolute: 0 10*3/uL (ref 0.0–0.1)
Basophils Relative: 0 %
Eosinophils Absolute: 0.1 10*3/uL (ref 0.0–0.5)
Eosinophils Relative: 1 %
HCT: 39.2 % (ref 39.0–52.0)
HEMOGLOBIN: 12.7 g/dL — AB (ref 13.0–17.0)
Immature Granulocytes: 0 %
Lymphocytes Relative: 18 %
Lymphs Abs: 1.3 10*3/uL (ref 0.7–4.0)
MCH: 30.5 pg (ref 26.0–34.0)
MCHC: 32.4 g/dL (ref 30.0–36.0)
MCV: 94 fL (ref 80.0–100.0)
Monocytes Absolute: 0.6 10*3/uL (ref 0.1–1.0)
Monocytes Relative: 8 %
NEUTROS ABS: 5.1 10*3/uL (ref 1.7–7.7)
Neutrophils Relative %: 73 %
Platelets: 204 10*3/uL (ref 150–400)
RBC: 4.17 MIL/uL — ABNORMAL LOW (ref 4.22–5.81)
RDW: 12.6 % (ref 11.5–15.5)
WBC: 7.1 10*3/uL (ref 4.0–10.5)
nRBC: 0 % (ref 0.0–0.2)

## 2018-04-17 LAB — COMPREHENSIVE METABOLIC PANEL
ALT: 34 U/L (ref 0–44)
AST: 49 U/L — ABNORMAL HIGH (ref 15–41)
Albumin: 3.7 g/dL (ref 3.5–5.0)
Alkaline Phosphatase: 44 U/L (ref 38–126)
Anion gap: 10 (ref 5–15)
BUN: 24 mg/dL — ABNORMAL HIGH (ref 8–23)
CO2: 25 mmol/L (ref 22–32)
CREATININE: 1.18 mg/dL (ref 0.61–1.24)
Calcium: 9.6 mg/dL (ref 8.9–10.3)
Chloride: 102 mmol/L (ref 98–111)
GFR calc Af Amer: >60 mL/min (ref >60)
GFR calc non Af Amer: 56 mL/min — ABNORMAL LOW (ref >60)
Glucose, Bld: 153 mg/dL — ABNORMAL HIGH (ref 70–99)
Potassium: 4.3 mmol/L (ref 3.5–5.1)
Sodium: 137 mmol/L (ref 135–145)
Total Bilirubin: 0.8 mg/dL (ref 0.3–1.2)
Total Protein: 6.6 g/dL (ref 6.5–8.1)

## 2018-04-17 LAB — URINALYSIS, ROUTINE W REFLEX MICROSCOPIC
Bilirubin Urine: NEGATIVE
Glucose, UA: NEGATIVE mg/dL
Hgb urine dipstick: NEGATIVE
Ketones, ur: NEGATIVE mg/dL
Leukocytes,Ua: NEGATIVE
Nitrite: NEGATIVE
Protein, ur: NEGATIVE mg/dL
Specific Gravity, Urine: 1.012 (ref 1.005–1.030)
pH: 6 (ref 5.0–8.0)

## 2018-04-17 LAB — CBG MONITORING, ED: GLUCOSE-CAPILLARY: 156 mg/dL — AB (ref 70–99)

## 2018-04-17 MED ORDER — SODIUM CHLORIDE 0.9 % IV BOLUS
1000.0000 mL | Freq: Once | INTRAVENOUS | Status: AC
  Administered 2018-04-17: 1000 mL via INTRAVENOUS

## 2018-04-17 NOTE — ED Notes (Signed)
Alan Rubio (son) [phone] Alan Rubio (wife) [phone]

## 2018-04-17 NOTE — Discharge Instructions (Addendum)
Make sure that you are drinking plenty of water. Get help right away if you: Have chest pain. Have a fast or irregular heartbeat. Develop numbness in any part of your body. Cannot move your arms or your legs. Have trouble speaking. Become sweaty or feel light-headed. Faint. Feel short of breath. Have trouble staying awake. Feel confused.

## 2018-04-17 NOTE — ED Notes (Signed)
plces pt.on o2 due pts.sats reading 65% ra.now 02sat 100% on 2l. Reported to nurse HOPE RN.

## 2018-04-17 NOTE — ED Provider Notes (Signed)
[facility] EMERGENCY DEPARTMENT Provider Note   CSN: [id number] Arrival date & time: 04/17/18  1217    History   Chief Complaint Chief Complaint  Patient presents with  . Weakness    HPI Alan Rubio is a 85 y.o. male.  Who presents emergency department with chief complaint of presyncope.  She has a past medical history of diabetes, hypertension, thyroid disease and cancer.  Patient was discharged 2 days ago after cerebrovascular accident.  Patient has no personal complaints at this time.  According to EMS he was sitting at the breakfast table this morning.  His family and his wife noticed that he turned gray and began to slump over.  He did not fully lose consciousness, they moved him to a recliner in the living room.  His wife called 39.  The patient states that he remembers the event completely and that he was "just resting."  Acknowledges feeling globally weak after his hospitalization but denies any dizziness or feelings of presyncope.  EMS reports that he was markedly orthostatic with systolic pressure of 097 with sitting and 88 with standing.  It was started on Plavix at discharge.  He has on no new hypertension medications.  He denies melena or hematochezia.  He denies palpitations racing or skipping.  He denies chest pain or shortness of breath.    HPI  Past Medical History:  Diagnosis Date  . Cancer ([place])   . Diabetes mellitus without complication ([place])   . Hypertension   . Thyroid disease     Patient Active Problem List   Diagnosis Date Noted  . CVA (cerebral vascular accident) ([place]) 04/14/2018  . Anemia 04/14/2018  . HTN (hypertension) 04/14/2018  . Gout 04/14/2018  . Type 2 diabetes mellitus ([place]) 04/14/2018    Past Surgical History:  Procedure Laterality Date  . BACK SURGERY          Home Medications    Prior to Admission medications   Medication Sig Start Date End Date Taking? Authorizing Provider  allopurinol (ZYLOPRIM) 300 MG tablet  Take 300 mg by mouth daily.   Yes [provider]  aspirin EC 81 MG EC tablet Take 1 tablet (81 mg total) by mouth daily. 04/16/18  Yes Mikhail, Maryann, DO  atenolol (TENORMIN) 50 MG tablet Take 50 mg by mouth daily. 03/11/18  Yes [provider]  atorvastatin (LIPITOR) 40 MG tablet Take 1 tablet (40 mg total) by mouth daily at 6 PM. 04/15/18  Yes Mikhail, Velta Addison, DO  clopidogrel (PLAVIX) 75 MG tablet Take 1 tablet (75 mg total) by mouth daily. 04/16/18  Yes Mikhail, Velta Addison, DO  doxazosin (CARDURA) 8 MG tablet Take 8 mg by mouth daily.   Yes [provider]  levothyroxine (SYNTHROID, LEVOTHROID) 75 MCG tablet Take 75 mcg by mouth daily before breakfast.   Yes [provider]  losartan (COZAAR) 25 MG tablet Take 25 mg by mouth daily. 02/09/18  Yes [provider]  pioglitazone (ACTOS) 15 MG tablet Take 15 mg by mouth daily.   Yes [provider]  Polyethyl Glycol-Propyl Glycol (SYSTANE) 0.4-0.3 % SOLN Place 1 drop into both eyes 3 (three) times daily as needed (dry eyes).   Yes [provider]    Family History History reviewed. No pertinent family history.  Social History Social History   Tobacco Use  . Smoking status: Never Smoker  . Smokeless tobacco: Never Used  Substance Use Topics  . Alcohol use: Yes    Comment: occasional   .  Drug use: Not on file     Allergies   Penicillins   Review of Systems Review of Systems Ten systems reviewed and are negative for acute change, except as noted in the HPI.    Physical Exam Updated Vital Signs BP (!) 147/71   Pulse (!) 54   Temp 97.7 F (36.5 C) (Oral)   Resp 19   SpO2 100%   Physical Exam Vitals signs and nursing note reviewed.  Constitutional:      General: He is not in acute distress.    Appearance: He is well-developed. He is not diaphoretic.  HENT:     Head: Normocephalic and atraumatic.  Eyes:     General: No scleral icterus.    Conjunctiva/sclera:  Conjunctivae normal.  Neck:     Musculoskeletal: Normal range of motion and neck supple.  Cardiovascular:     Rate and Rhythm: Normal rate and regular rhythm.     Heart sounds: Normal heart sounds.  Pulmonary:     Effort: Pulmonary effort is normal. No respiratory distress.     Breath sounds: Normal breath sounds.  Abdominal:     Palpations: Abdomen is soft.     Tenderness: There is no abdominal tenderness.  Skin:    General: Skin is warm and dry.  Neurological:     Mental Status: He is alert and oriented to person, place, and time.  Psychiatric:        Behavior: Behavior normal.      ED Treatments / Results  Labs (all labs ordered are listed, but only abnormal results are displayed) Labs Reviewed  CBC WITH DIFFERENTIAL/PLATELET - Abnormal; Notable for the following components:      Result Value   RBC 4.17 (*)    Hemoglobin 12.7 (*)    All other components within normal limits  COMPREHENSIVE METABOLIC PANEL - Abnormal; Notable for the following components:   Glucose, Bld 153 (*)    BUN 24 (*)    AST 49 (*)    GFR calc non Af Amer 56 (*)    All other components within normal limits  CBG MONITORING, ED - Abnormal; Notable for the following components:   Glucose-Capillary 156 (*)    All other components within normal limits  URINALYSIS, ROUTINE W REFLEX MICROSCOPIC    EKG EKG Interpretation  Date/Time:  Monday April 17 2018 12:18:36 EDT Ventricular Rate:  55 PR Interval:    QRS Duration: 100 QT Interval:  425 QTC Calculation: 407 R Axis:   15 Text Interpretation:  Sinus rhythm Prolonged PR interval Low voltage, precordial leads No significant change since last tracing Confirmed by Fredia Sorrow [phone]) on 04/17/2018 1:15:12 PM   Radiology Dg Chest 2 View  Result Date: 04/17/2018 CLINICAL DATA:  Recent syncopal episode EXAM: CHEST - 2 VIEW COMPARISON:  None. FINDINGS: Cardiac shadows within normal limits. The lungs are well aerated bilaterally with mild  calcified pleural plaques best seen on the lateral projection. No focal infiltrate or sizable effusion is seen. Degenerative change of the thoracic spine is noted. IMPRESSION: Chronic pleural plaquing with calcification. No acute abnormality is noted. Electronically Signed   By: Inez Catalina M.D.   On: 04/17/2018 12:57    Procedures Procedures (including critical care time)  Medications Ordered in ED Medications  sodium chloride 0.9 % bolus 1,000 mL (0 mLs Intravenous Stopped 04/17/18 1645)     Initial Impression / Assessment and Plan / ED Course  I have reviewed the triage vital signs and the  nursing notes.  Pertinent labs & imaging results that were available during my care of the patient were reviewed by me and considered in my medical decision making (see chart for details).  Clinical Course as of Apr 17 628  Mon Apr 17, 2018  1458 Glucose(!): 153 [AH]  1458 BUN(!): 24 [AH]  1555 Hgb appears to be at baseline  Hemoglobin(!): 12.7 [AH]    Clinical Course User Index [AH] Margarita Mail, PA-C       CC: Presyncope VS:  Vitals:   04/17/18 1345 04/17/18 1430 04/17/18 1530 04/17/18 1600  BP: 137/60 (!) 168/73 (!) 144/69 (!) 147/71  Pulse: (!) 51 (!) 57 (!) 55 (!) 54  Resp: 13 (!) 26 (!) 22 19  Temp:      TempSrc:      SpO2: 99% 100% 100% 100%   LX:BWIOMBT is gathered by the patient and EMS, Review of EMR. DDX:The differential for syncope is extensive and includes, but is not limited to: arrythmia (Vtach, SVT, SSS, sinus arrest, AV block, bradycardia) aortic stenosis, AMI, HOCM, PE, atrial myxoma, pulmonary hypertension, orthostatic hypotension, (hypovolemia, drug effect, GB syndrome, micturition, cough, swallowing) carotid sinus sensitivity, Seizure, TIA/CVA, hypoglycemia, acute anemia, Vertigo. Labs: I reviewed the labs which show Hgb is about baseline, mild hyperglycemia, Normal UA. Imaging: I personally reviewed the images (2 V CXR) which show(s) No acute abnormalities or  consolidations. EKG: Sinus rhythm with prolonged PR- No changes since previous tracing. MDM:Chaunce Dunlow is a 85 y.o. male who presents to ED for presyncope evaluation which occured just prior to arrival. He had positive orthostatic VS.  On exam, patient is afebrile with no focal neuro deficits and benign cardiopulmonary exam. Patient with no personal or family history of sudden cardiac death. No complaints of chest pain or shortness of breath.  No murmur suggestive of aortic stenosis or HOCM. No evidence of acute anemia or reported melena. His hemodynamics have improved with fluid  Evaluation does not show pathology that would require ongoing emergent intervention or inpatient treatment. Will discharge to home with close PCP follow up. Reasons to return to ER were discussed.   Pt has remained hemodynamically stable throughout their time in the ED.   BP (!) 147/71   Pulse (!) 54   Temp 97.7 F (36.5 C) (Oral)   Resp 19   SpO2 100%   Patient understands return precautions and follow up care. All questions answered.   Patient disposition:  Discharge Home Patient condition: Improved. The patient appears reasonably screened and/or stabilized for discharge and I doubt any other medical condition or other [facility] requiring further screening, evaluation, or treatment in the ED at this time prior to discharge. I have discussed lab and/or imaging findings with the patient and answered all questions/concerns to the best of my ability. I have discussed return precautions and OP follow up.      Final Clinical Impressions(s) / ED Diagnoses   Final diagnoses:  Hypotension, unspecified hypotension type    ED Discharge Orders    None       Margarita Mail, PA-C 04/18/18 5974    Fredia Sorrow, MD 05/05/18 2308

## 2018-04-17 NOTE — ED Notes (Signed)
Pt able to ambulate independently and without difficulty in hallway. Remains 98% O2 sat during ambulation on RA (uses RA at home)

## 2018-04-17 NOTE — ED Triage Notes (Signed)
Pt had recent ischemic stroke, d/ced from hospital on Saturday. No residual deficits. Today pt was sitting at the table with family, slouched over, appeared gray but did not lose consciousness. Pt was taken to the couch- EMS arrived. BP 124/68 sitting, 80/60 standing. HR 60, CBG 161 Pt alert and oriented, normal coloring.

## 2018-04-19 DIAGNOSIS — J929 Pleural plaque without asbestos: Secondary | ICD-10-CM | POA: Diagnosis not present

## 2018-04-19 DIAGNOSIS — M47815 Spondylosis without myelopathy or radiculopathy, thoracolumbar region: Secondary | ICD-10-CM | POA: Diagnosis not present

## 2018-04-19 DIAGNOSIS — I10 Essential (primary) hypertension: Secondary | ICD-10-CM | POA: Diagnosis not present

## 2018-04-19 DIAGNOSIS — I951 Orthostatic hypotension: Secondary | ICD-10-CM | POA: Diagnosis not present

## 2018-04-19 DIAGNOSIS — E039 Hypothyroidism, unspecified: Secondary | ICD-10-CM | POA: Diagnosis not present

## 2018-04-19 DIAGNOSIS — Z9181 History of falling: Secondary | ICD-10-CM | POA: Diagnosis not present

## 2018-04-19 DIAGNOSIS — M109 Gout, unspecified: Secondary | ICD-10-CM | POA: Diagnosis not present

## 2018-04-19 DIAGNOSIS — I4430 Unspecified atrioventricular block: Secondary | ICD-10-CM | POA: Diagnosis not present

## 2018-04-19 DIAGNOSIS — E119 Type 2 diabetes mellitus without complications: Secondary | ICD-10-CM | POA: Diagnosis not present

## 2018-04-19 DIAGNOSIS — Z7982 Long term (current) use of aspirin: Secondary | ICD-10-CM | POA: Diagnosis not present

## 2018-04-19 DIAGNOSIS — I69398 Other sequelae of cerebral infarction: Secondary | ICD-10-CM | POA: Diagnosis not present

## 2018-04-19 DIAGNOSIS — H532 Diplopia: Secondary | ICD-10-CM | POA: Diagnosis not present

## 2018-04-19 DIAGNOSIS — D649 Anemia, unspecified: Secondary | ICD-10-CM | POA: Diagnosis not present

## 2018-04-19 DIAGNOSIS — I69393 Ataxia following cerebral infarction: Secondary | ICD-10-CM | POA: Diagnosis not present

## 2018-04-19 DIAGNOSIS — Z7902 Long term (current) use of antithrombotics/antiplatelets: Secondary | ICD-10-CM | POA: Diagnosis not present

## 2018-04-19 DIAGNOSIS — Z859 Personal history of malignant neoplasm, unspecified: Secondary | ICD-10-CM | POA: Diagnosis not present

## 2018-04-20 DIAGNOSIS — Z8673 Personal history of transient ischemic attack (TIA), and cerebral infarction without residual deficits: Secondary | ICD-10-CM | POA: Diagnosis not present

## 2018-04-20 DIAGNOSIS — I951 Orthostatic hypotension: Secondary | ICD-10-CM | POA: Diagnosis not present

## 2018-04-20 DIAGNOSIS — E039 Hypothyroidism, unspecified: Secondary | ICD-10-CM | POA: Diagnosis not present

## 2018-04-20 DIAGNOSIS — H532 Diplopia: Secondary | ICD-10-CM | POA: Diagnosis not present

## 2018-04-20 DIAGNOSIS — I69393 Ataxia following cerebral infarction: Secondary | ICD-10-CM | POA: Diagnosis not present

## 2018-04-20 DIAGNOSIS — E119 Type 2 diabetes mellitus without complications: Secondary | ICD-10-CM | POA: Diagnosis not present

## 2018-04-20 DIAGNOSIS — D649 Anemia, unspecified: Secondary | ICD-10-CM | POA: Diagnosis not present

## 2018-04-20 DIAGNOSIS — I69398 Other sequelae of cerebral infarction: Secondary | ICD-10-CM | POA: Diagnosis not present

## 2018-04-21 ENCOUNTER — Other Ambulatory Visit: Payer: Self-pay | Admitting: *Deleted

## 2018-04-21 DIAGNOSIS — I951 Orthostatic hypotension: Secondary | ICD-10-CM | POA: Diagnosis not present

## 2018-04-21 DIAGNOSIS — I69393 Ataxia following cerebral infarction: Secondary | ICD-10-CM | POA: Diagnosis not present

## 2018-04-21 DIAGNOSIS — D649 Anemia, unspecified: Secondary | ICD-10-CM | POA: Diagnosis not present

## 2018-04-21 DIAGNOSIS — H532 Diplopia: Secondary | ICD-10-CM | POA: Diagnosis not present

## 2018-04-21 DIAGNOSIS — I69398 Other sequelae of cerebral infarction: Secondary | ICD-10-CM | POA: Diagnosis not present

## 2018-04-21 DIAGNOSIS — E119 Type 2 diabetes mellitus without complications: Secondary | ICD-10-CM | POA: Diagnosis not present

## 2018-04-21 NOTE — Patient Outreach (Addendum)
[facility]) Care Management  04/21/2018  Alan Rubio October 14, 1931 [id number]   Subjective: Telephone call to patient's home / mobile number, spoke with patient, and HIPAA verified.  Discussed [facility] Care Management Medicare EMMI Stroke Red Flag Alert follow up, patient voiced understanding, and is in agreement to follow up.  Patient gave verbal consent for RNCM to speak with wife Alan Rubio) regarding his healthcare needs as needed.  Spoke with wife, discussed [place] Management services, wife voiced understanding, and in agreement to follow up on patient's behalf.  Wife states patient is improving daily, worked with home health therapist (physical therapy, occupational therapy) earlier today through [place], therapy went well,and  remember receiving automated call.  Wife states patient had a follow up with primary provider on 04/20/2018, and will have follow up appointment with neurologist on 06/01/2018. States neurologist had also mentioned referring patient to a cardiologist while he was in the hospital post discharge and wife is planning to follow up with neurologist office to verify if cardiology referral will be sent prior to neurology follow up visit.  Wife states patient has not felt the best today, no fever, some weakness, is aware of signs/ symptoms to report, and will follow up with provider if needed. Wife states she needs diabetes diet education handouts so she knows how to cook for patient, has followed up with patient's diabetes provider, was given a website by diabetes provider, will follow up with provider for a referral to nutritionist if needed, and is in agreement to receive the following EMMI educational handouts: Diabetes and Diet, Diabetes Diet.  Wife states patient does not have any EMMI follow up, care coordination, care management, disease monitoring, transportation, community resource, or pharmacy needs at this time.   States she is very appreciative of the follow up and  is in agreement to receive [place] Management information.      Objective:Per KPN (Knowledge Performance Now, point of care tool) and chart review, patient hospitalized 04/13/2018 - 04/15/2018 for CVA.   Patient also has a history diabetes, hypertension, gout, and hypothyroidism.      Assessment: Received Medicare EMMI Stroke Red Flag Alert follow up referral on 04/19/2018.   Red Flag Alert trigger, Day #1, patient answered no to the following question: Scheduled a follow-up appointment?  EMMI follow up completed and no further care management needs.     Plan: RNCM will send patient successful outreach letter, [facility] pamphlet, magnet, EMMI educational handouts ( Diabetes Diet, Diabetes and Diet). RNCM will complete case closure due to follow up completed / no care management needs.      [name] H. Annia Friendly, BSN, [place] Management [facility] Telephonic CM Phone: [phone] Fax: [phone]

## 2018-04-24 DIAGNOSIS — D649 Anemia, unspecified: Secondary | ICD-10-CM | POA: Diagnosis not present

## 2018-04-24 DIAGNOSIS — E119 Type 2 diabetes mellitus without complications: Secondary | ICD-10-CM | POA: Diagnosis not present

## 2018-04-24 DIAGNOSIS — I951 Orthostatic hypotension: Secondary | ICD-10-CM | POA: Diagnosis not present

## 2018-04-24 DIAGNOSIS — H532 Diplopia: Secondary | ICD-10-CM | POA: Diagnosis not present

## 2018-04-24 DIAGNOSIS — I69393 Ataxia following cerebral infarction: Secondary | ICD-10-CM | POA: Diagnosis not present

## 2018-04-24 DIAGNOSIS — I69398 Other sequelae of cerebral infarction: Secondary | ICD-10-CM | POA: Diagnosis not present

## 2018-04-26 DIAGNOSIS — I951 Orthostatic hypotension: Secondary | ICD-10-CM | POA: Diagnosis not present

## 2018-04-26 DIAGNOSIS — I69393 Ataxia following cerebral infarction: Secondary | ICD-10-CM | POA: Diagnosis not present

## 2018-04-26 DIAGNOSIS — I69398 Other sequelae of cerebral infarction: Secondary | ICD-10-CM | POA: Diagnosis not present

## 2018-04-26 DIAGNOSIS — D649 Anemia, unspecified: Secondary | ICD-10-CM | POA: Diagnosis not present

## 2018-04-26 DIAGNOSIS — E119 Type 2 diabetes mellitus without complications: Secondary | ICD-10-CM | POA: Diagnosis not present

## 2018-04-26 DIAGNOSIS — H532 Diplopia: Secondary | ICD-10-CM | POA: Diagnosis not present

## 2018-04-27 DIAGNOSIS — H532 Diplopia: Secondary | ICD-10-CM | POA: Diagnosis not present

## 2018-04-27 DIAGNOSIS — I69393 Ataxia following cerebral infarction: Secondary | ICD-10-CM | POA: Diagnosis not present

## 2018-04-27 DIAGNOSIS — E119 Type 2 diabetes mellitus without complications: Secondary | ICD-10-CM | POA: Diagnosis not present

## 2018-04-27 DIAGNOSIS — D649 Anemia, unspecified: Secondary | ICD-10-CM | POA: Diagnosis not present

## 2018-04-27 DIAGNOSIS — I951 Orthostatic hypotension: Secondary | ICD-10-CM | POA: Diagnosis not present

## 2018-04-27 DIAGNOSIS — I69398 Other sequelae of cerebral infarction: Secondary | ICD-10-CM | POA: Diagnosis not present

## 2018-05-01 DIAGNOSIS — I69398 Other sequelae of cerebral infarction: Secondary | ICD-10-CM | POA: Diagnosis not present

## 2018-05-01 DIAGNOSIS — E119 Type 2 diabetes mellitus without complications: Secondary | ICD-10-CM | POA: Diagnosis not present

## 2018-05-01 DIAGNOSIS — D649 Anemia, unspecified: Secondary | ICD-10-CM | POA: Diagnosis not present

## 2018-05-01 DIAGNOSIS — I951 Orthostatic hypotension: Secondary | ICD-10-CM | POA: Diagnosis not present

## 2018-05-01 DIAGNOSIS — I69393 Ataxia following cerebral infarction: Secondary | ICD-10-CM | POA: Diagnosis not present

## 2018-05-01 DIAGNOSIS — H532 Diplopia: Secondary | ICD-10-CM | POA: Diagnosis not present

## 2018-05-03 DIAGNOSIS — D649 Anemia, unspecified: Secondary | ICD-10-CM | POA: Diagnosis not present

## 2018-05-03 DIAGNOSIS — E119 Type 2 diabetes mellitus without complications: Secondary | ICD-10-CM | POA: Diagnosis not present

## 2018-05-03 DIAGNOSIS — H532 Diplopia: Secondary | ICD-10-CM | POA: Diagnosis not present

## 2018-05-03 DIAGNOSIS — I951 Orthostatic hypotension: Secondary | ICD-10-CM | POA: Diagnosis not present

## 2018-05-03 DIAGNOSIS — I69393 Ataxia following cerebral infarction: Secondary | ICD-10-CM | POA: Diagnosis not present

## 2018-05-03 DIAGNOSIS — I69398 Other sequelae of cerebral infarction: Secondary | ICD-10-CM | POA: Diagnosis not present

## 2018-05-09 DIAGNOSIS — E119 Type 2 diabetes mellitus without complications: Secondary | ICD-10-CM | POA: Diagnosis not present

## 2018-05-09 DIAGNOSIS — H532 Diplopia: Secondary | ICD-10-CM | POA: Diagnosis not present

## 2018-05-09 DIAGNOSIS — I951 Orthostatic hypotension: Secondary | ICD-10-CM | POA: Diagnosis not present

## 2018-05-09 DIAGNOSIS — I69393 Ataxia following cerebral infarction: Secondary | ICD-10-CM | POA: Diagnosis not present

## 2018-05-09 DIAGNOSIS — D649 Anemia, unspecified: Secondary | ICD-10-CM | POA: Diagnosis not present

## 2018-05-09 DIAGNOSIS — I69398 Other sequelae of cerebral infarction: Secondary | ICD-10-CM | POA: Diagnosis not present

## 2018-05-12 DIAGNOSIS — D649 Anemia, unspecified: Secondary | ICD-10-CM | POA: Diagnosis not present

## 2018-05-12 DIAGNOSIS — I69393 Ataxia following cerebral infarction: Secondary | ICD-10-CM | POA: Diagnosis not present

## 2018-05-12 DIAGNOSIS — H532 Diplopia: Secondary | ICD-10-CM | POA: Diagnosis not present

## 2018-05-12 DIAGNOSIS — E119 Type 2 diabetes mellitus without complications: Secondary | ICD-10-CM | POA: Diagnosis not present

## 2018-05-12 DIAGNOSIS — I951 Orthostatic hypotension: Secondary | ICD-10-CM | POA: Diagnosis not present

## 2018-05-12 DIAGNOSIS — I69398 Other sequelae of cerebral infarction: Secondary | ICD-10-CM | POA: Diagnosis not present

## 2018-05-16 DIAGNOSIS — I69398 Other sequelae of cerebral infarction: Secondary | ICD-10-CM | POA: Diagnosis not present

## 2018-05-16 DIAGNOSIS — E119 Type 2 diabetes mellitus without complications: Secondary | ICD-10-CM | POA: Diagnosis not present

## 2018-05-16 DIAGNOSIS — D649 Anemia, unspecified: Secondary | ICD-10-CM | POA: Diagnosis not present

## 2018-05-16 DIAGNOSIS — H532 Diplopia: Secondary | ICD-10-CM | POA: Diagnosis not present

## 2018-05-16 DIAGNOSIS — I69393 Ataxia following cerebral infarction: Secondary | ICD-10-CM | POA: Diagnosis not present

## 2018-05-16 DIAGNOSIS — I951 Orthostatic hypotension: Secondary | ICD-10-CM | POA: Diagnosis not present

## 2018-05-19 DIAGNOSIS — M47815 Spondylosis without myelopathy or radiculopathy, thoracolumbar region: Secondary | ICD-10-CM | POA: Diagnosis not present

## 2018-05-19 DIAGNOSIS — I69393 Ataxia following cerebral infarction: Secondary | ICD-10-CM | POA: Diagnosis not present

## 2018-05-19 DIAGNOSIS — I951 Orthostatic hypotension: Secondary | ICD-10-CM | POA: Diagnosis not present

## 2018-05-19 DIAGNOSIS — M109 Gout, unspecified: Secondary | ICD-10-CM | POA: Diagnosis not present

## 2018-05-19 DIAGNOSIS — J929 Pleural plaque without asbestos: Secondary | ICD-10-CM | POA: Diagnosis not present

## 2018-05-19 DIAGNOSIS — Z7982 Long term (current) use of aspirin: Secondary | ICD-10-CM | POA: Diagnosis not present

## 2018-05-19 DIAGNOSIS — I10 Essential (primary) hypertension: Secondary | ICD-10-CM | POA: Diagnosis not present

## 2018-05-19 DIAGNOSIS — E039 Hypothyroidism, unspecified: Secondary | ICD-10-CM | POA: Diagnosis not present

## 2018-05-19 DIAGNOSIS — Z7902 Long term (current) use of antithrombotics/antiplatelets: Secondary | ICD-10-CM | POA: Diagnosis not present

## 2018-05-19 DIAGNOSIS — Z9181 History of falling: Secondary | ICD-10-CM | POA: Diagnosis not present

## 2018-05-19 DIAGNOSIS — D649 Anemia, unspecified: Secondary | ICD-10-CM | POA: Diagnosis not present

## 2018-05-19 DIAGNOSIS — Z859 Personal history of malignant neoplasm, unspecified: Secondary | ICD-10-CM | POA: Diagnosis not present

## 2018-05-19 DIAGNOSIS — I69398 Other sequelae of cerebral infarction: Secondary | ICD-10-CM | POA: Diagnosis not present

## 2018-05-19 DIAGNOSIS — H532 Diplopia: Secondary | ICD-10-CM | POA: Diagnosis not present

## 2018-05-19 DIAGNOSIS — E119 Type 2 diabetes mellitus without complications: Secondary | ICD-10-CM | POA: Diagnosis not present

## 2018-05-19 DIAGNOSIS — I4430 Unspecified atrioventricular block: Secondary | ICD-10-CM | POA: Diagnosis not present

## 2018-05-22 DIAGNOSIS — E785 Hyperlipidemia, unspecified: Secondary | ICD-10-CM | POA: Diagnosis not present

## 2018-05-22 DIAGNOSIS — H532 Diplopia: Secondary | ICD-10-CM | POA: Diagnosis not present

## 2018-05-22 DIAGNOSIS — I951 Orthostatic hypotension: Secondary | ICD-10-CM | POA: Diagnosis not present

## 2018-05-22 DIAGNOSIS — I10 Essential (primary) hypertension: Secondary | ICD-10-CM | POA: Diagnosis not present

## 2018-05-22 DIAGNOSIS — D649 Anemia, unspecified: Secondary | ICD-10-CM | POA: Diagnosis not present

## 2018-05-22 DIAGNOSIS — I69393 Ataxia following cerebral infarction: Secondary | ICD-10-CM | POA: Diagnosis not present

## 2018-05-22 DIAGNOSIS — E039 Hypothyroidism, unspecified: Secondary | ICD-10-CM | POA: Diagnosis not present

## 2018-05-22 DIAGNOSIS — Z5181 Encounter for therapeutic drug level monitoring: Secondary | ICD-10-CM | POA: Diagnosis not present

## 2018-05-22 DIAGNOSIS — I69398 Other sequelae of cerebral infarction: Secondary | ICD-10-CM | POA: Diagnosis not present

## 2018-05-22 DIAGNOSIS — E1142 Type 2 diabetes mellitus with diabetic polyneuropathy: Secondary | ICD-10-CM | POA: Diagnosis not present

## 2018-05-22 DIAGNOSIS — E119 Type 2 diabetes mellitus without complications: Secondary | ICD-10-CM | POA: Diagnosis not present

## 2018-05-24 DIAGNOSIS — I951 Orthostatic hypotension: Secondary | ICD-10-CM | POA: Diagnosis not present

## 2018-05-24 DIAGNOSIS — D649 Anemia, unspecified: Secondary | ICD-10-CM | POA: Diagnosis not present

## 2018-05-24 DIAGNOSIS — I69398 Other sequelae of cerebral infarction: Secondary | ICD-10-CM | POA: Diagnosis not present

## 2018-05-24 DIAGNOSIS — E119 Type 2 diabetes mellitus without complications: Secondary | ICD-10-CM | POA: Diagnosis not present

## 2018-05-24 DIAGNOSIS — H532 Diplopia: Secondary | ICD-10-CM | POA: Diagnosis not present

## 2018-05-24 DIAGNOSIS — I69393 Ataxia following cerebral infarction: Secondary | ICD-10-CM | POA: Diagnosis not present

## 2018-05-29 DIAGNOSIS — I951 Orthostatic hypotension: Secondary | ICD-10-CM | POA: Diagnosis not present

## 2018-05-29 DIAGNOSIS — I69393 Ataxia following cerebral infarction: Secondary | ICD-10-CM | POA: Diagnosis not present

## 2018-05-29 DIAGNOSIS — D649 Anemia, unspecified: Secondary | ICD-10-CM | POA: Diagnosis not present

## 2018-05-29 DIAGNOSIS — I69398 Other sequelae of cerebral infarction: Secondary | ICD-10-CM | POA: Diagnosis not present

## 2018-05-29 DIAGNOSIS — E119 Type 2 diabetes mellitus without complications: Secondary | ICD-10-CM | POA: Diagnosis not present

## 2018-05-29 DIAGNOSIS — H532 Diplopia: Secondary | ICD-10-CM | POA: Diagnosis not present

## 2018-05-31 ENCOUNTER — Telehealth: Payer: Self-pay | Admitting: Adult Health

## 2018-05-31 DIAGNOSIS — I69398 Other sequelae of cerebral infarction: Secondary | ICD-10-CM | POA: Diagnosis not present

## 2018-05-31 DIAGNOSIS — H532 Diplopia: Secondary | ICD-10-CM | POA: Diagnosis not present

## 2018-05-31 DIAGNOSIS — E119 Type 2 diabetes mellitus without complications: Secondary | ICD-10-CM | POA: Diagnosis not present

## 2018-05-31 DIAGNOSIS — D649 Anemia, unspecified: Secondary | ICD-10-CM | POA: Diagnosis not present

## 2018-05-31 DIAGNOSIS — I951 Orthostatic hypotension: Secondary | ICD-10-CM | POA: Diagnosis not present

## 2018-05-31 DIAGNOSIS — I69393 Ataxia following cerebral infarction: Secondary | ICD-10-CM | POA: Diagnosis not present

## 2018-05-31 NOTE — Telephone Encounter (Signed)
I spoke with the patients wife about changing to a VV she consent and said she was going to have their son come over so they would have access to a camera. She is going to call back with his email so I can send the doxy link. DW

## 2018-06-01 ENCOUNTER — Telehealth: Payer: Self-pay | Admitting: Adult Health

## 2018-06-01 ENCOUNTER — Inpatient Hospital Stay: Payer: Medicare Other | Admitting: Adult Health

## 2018-06-01 NOTE — Telephone Encounter (Signed)
I called pts wife that to have her neighbor or family friend to make sure the camera is installed on her desk top computer. I stated the visit is on Tuesday. The wife stated she does not have a smart phone only computer. I advise her to troubleshoot the computer and camera this weekend. The wife verbalized understanding and will call back Monday if she has any questions.

## 2018-06-01 NOTE — Telephone Encounter (Signed)
Pts wife called and gave an email where she would like the link to be sent to. She would like it seveal days before the appt so that she can test it and make sure she is able to get on for the appt date. Pt has consented to the Virtual Visit and for the insurance to be billed as such.

## 2018-06-05 ENCOUNTER — Telehealth: Payer: Self-pay | Admitting: Adult Health

## 2018-06-05 NOTE — Telephone Encounter (Signed)
I spoke with [place] pts granddaughter she has a Ipad and a cell phone. I sent link to her email at [email or web address] and text it to her cell phone. She receive link to both devices and was explain the doxy video process. She was advise to log on to link 10 minutes prior to visit and make sure video is on [place]. [place] verbalized understanding.

## 2018-06-05 NOTE — Telephone Encounter (Signed)
Pt's wife called and stated that she would like the link to the VV sent to her granddaughters Ipad [phone]

## 2018-06-05 NOTE — Telephone Encounter (Signed)
Pts grand daughter [place] and her email address is  [email or web address].

## 2018-06-06 ENCOUNTER — Other Ambulatory Visit: Payer: Self-pay

## 2018-06-06 ENCOUNTER — Other Ambulatory Visit: Payer: Self-pay | Admitting: Cardiology

## 2018-06-06 ENCOUNTER — Encounter: Payer: Self-pay | Admitting: Adult Health

## 2018-06-06 ENCOUNTER — Ambulatory Visit (INDEPENDENT_AMBULATORY_CARE_PROVIDER_SITE_OTHER): Payer: Medicare Other | Admitting: Adult Health

## 2018-06-06 DIAGNOSIS — E119 Type 2 diabetes mellitus without complications: Secondary | ICD-10-CM | POA: Diagnosis not present

## 2018-06-06 DIAGNOSIS — I10 Essential (primary) hypertension: Secondary | ICD-10-CM

## 2018-06-06 DIAGNOSIS — I6330 Cerebral infarction due to thrombosis of unspecified cerebral artery: Secondary | ICD-10-CM | POA: Diagnosis not present

## 2018-06-06 DIAGNOSIS — E1159 Type 2 diabetes mellitus with other circulatory complications: Secondary | ICD-10-CM | POA: Diagnosis not present

## 2018-06-06 DIAGNOSIS — E785 Hyperlipidemia, unspecified: Secondary | ICD-10-CM | POA: Diagnosis not present

## 2018-06-06 DIAGNOSIS — I749 Embolism and thrombosis of unspecified artery: Secondary | ICD-10-CM

## 2018-06-06 DIAGNOSIS — K921 Melena: Secondary | ICD-10-CM | POA: Diagnosis not present

## 2018-06-06 DIAGNOSIS — I639 Cerebral infarction, unspecified: Secondary | ICD-10-CM

## 2018-06-06 NOTE — Progress Notes (Signed)
[facility] [address] [place]. [place] [address] [phone]       VIRTUAL VISIT FOLLOW UP NOTE  Mr. Cassady Turano Date of Birth:  [birth year] Medical Record Number:  [id number]   Reason for Referral:  hospital stroke follow up    Virtual Visit via Video Note  I connected with Feliciana Rossetti on 06/06/18 at  9:45 AM EDT by a video enabled telemedicine application located remotely in my own home and verified that I am speaking with the correct person using two identifiers who was located at their own home and accompanied by his wife and granddaughter.   Visit scheduled by Katharine Look, RN. She discussed the limitations of evaluation and management by telemedicine and the availability of in person appointments. The patient expressed understanding and agreed to proceed.Please see telephone note for additional scheduling information and consent.    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 History obtained from patient, wife, granddaughter and chart review. Reviewed all radiology images and labs personally.  Mr. Moataz Tavis is a 85 y.o. male with history of type 2 diabetes mellitus, hypertension,  hypothyroidism, and melena who presented on 04/13/2018 with multiple falls, recent diplopia and listing to the right. He did not receive IV t-PA due to late presentation.  Stroke work-up revealed multiple bilateral infarcts including left thalamic and scattered right frontal and small hemorrhagic conversion and right MCA/ACA and left MCA/PCA with embolic etiology secondary to unknown source.  CTA head and neck showed multifocal intracranial stenosis.  2D echo  unremarkable.  LDL 84 and A1c 7.9.  Recommended 30-day cardiac event monitor to rule out atrial fibrillation and if negative, consider loop recorder for long-term monitoring.  Per notes, recent discontinuation of aspirin 81 mg due to melena with resolution 3 weeks prior to his stroke.  Due to resolution of symptoms, recommended 3 weeks DAPT and then aspirin alone for secondary stroke prevention with close monitoring of recurrent melena symptoms.  Initiated atorvastatin 40 mg daily and recommended close PCP follow-up for DM and HTN management.  Prior history of stroke per imaging.  Discharged home in stable condition with recommendations of outpatient PT/OT.  He has been stable from a stroke standpoint without residual deficits or recurring/new symptoms.  He does endorse slightly decreased stamina and energy level with some improvement since discharge.  He is currently receiving home PT/OT [facility] with benefit.  He continues to live with his wife and is able to maintain all ADLs and IADLs independently.  He is currently not on antithrombotic as he completed 3 weeks Plavix prescription and then completed 4 weeks aspirin prescription and was unaware to request for refills.  He is hesitant on restarting antithrombotic due to history of mild melena but denied recurrent melena or GI symptoms in which he previously experienced while on DAPT.  Blood pressure monitored at home typically ranging 140s/70s with occasional lower readings and higher readings.  Continues on atorvastatin without side effects myalgias.  Wife states they were not contacted in regards to 30-day cardiac event monitor therefore not completed at this time.  No further concerns at this time.  Denies new or worsening stroke/TIA symptoms.    ROS:  14 system review of systems performed and negative with exception of fatigue  PMH:  Past Medical History:  Diagnosis Date   Cancer ([place])    Diabetes mellitus without complication ([place])     Hypertension    Thyroid disease     PSH:  Past Surgical History:  Procedure Laterality Date   BACK SURGERY      Social History:  Social History   Socioeconomic History   Marital status: Married    Spouse name: Not on file   Number of children: Not on file   Years of education: Not on file   Highest education level: Not on file  Occupational History   Not on file  Social Needs   Financial resource strain: Not on file   Food insecurity:    Worry: Not on file    Inability: Not on file   Transportation needs:    Medical: Not on file    Non-medical: Not on file  Tobacco Use   Smoking status: Never Smoker   Smokeless tobacco: Never Used  Substance and Sexual Activity   Alcohol use: Yes    Comment: occasional    Drug use: Not on file   Sexual activity: Not on file  Lifestyle   Physical activity:    Days per week: Not on file    Minutes per session: Not on file   Stress: Not on file  Relationships   Social connections:    Talks on phone: Not on file    Gets together: Not on file    Attends religious service: Not on file    Active member of club or organization: Not on file    Attends meetings of clubs or organizations: Not on file    Relationship status: Not on file   Intimate partner violence:    Fear of current or ex partner: Not on file    Emotionally abused: Not on file    Physically abused: Not on file    Forced sexual activity: Not on file  Other Topics Concern   Not on file  Social History Narrative   Not on file    Family History: No family history on file.  Medications:   Current Outpatient Medications on File Prior to Visit  Medication Sig Dispense Refill   allopurinol (ZYLOPRIM) 300 MG tablet Take 300 mg by mouth daily.     atenolol (TENORMIN) 50 MG tablet Take 50 mg by mouth daily.     atorvastatin (LIPITOR) 40 MG tablet Take 1 tablet (40 mg total) by mouth daily at 6 PM. 30 tablet 0   doxazosin (CARDURA) 8 MG tablet  Take 8 mg by mouth daily.     levothyroxine (SYNTHROID, LEVOTHROID) 75 MCG tablet Take 75 mcg by mouth daily before breakfast.     losartan (COZAAR) 25 MG tablet Take 25 mg by mouth daily.     pioglitazone (ACTOS) 15 MG tablet Take 15 mg by mouth daily.     Polyethyl Glycol-Propyl Glycol (SYSTANE) 0.4-0.3 % SOLN Place 1 drop into both eyes 3 (three) times daily as needed (dry eyes).     No current facility-administered medications on file prior to visit.     Allergies:   Allergies  Allergen Reactions   Penicillins Rash    Did it involve swelling of the face/tongue/throat, SOB, or low BP? Yes Did it involve sudden or severe rash/hives, skin peeling, or any reaction on the inside of your mouth or nose? No Did you need to seek  medical attention at a hospital or doctor's office? No When did it last happen? If all above answers are "NO", may proceed with cephalosporin use.        Physical Exam  General: well developed, well nourished, pleasant elderly Caucasian male, seated, in no evident distress Head: head normocephalic and atraumatic.     Neurologic Exam Mental Status: Awake and fully alert. Oriented to place and time. Recent and remote memory intact. Attention span, concentration and fund of knowledge appropriate. Mood and affect appropriate.  Cranial Nerves: Extraocular movements full without nystagmus. Hearing diminished to voice. Facial sensation intact. Face, tongue, palate moves normally and symmetrically.  Motor: No evidence of weakness per drift assessment Sensory.: intact to light touch Coordination: Rapid alternating movements normal in all extremities. Finger-to-nose and heel-to-shin performed accurately bilaterally. Gait and Station: Deferred Reflexes: UTA   NIHSS  0 Modified Rankin  2    Diagnostic Data (Labs, Imaging, Testing)  Ct Angio Head W Or Wo Contrast Ct Angio Neck W Or Wo Contrast 04/14/2018 IMPRESSION:  1. Negative CTA for large vessel  occlusion.  2. Multifocal moderate to severe left P1 and P2 stenoses without vascular occlusion.  3. Severe atheromatous stenosis at the origin of the right vertebral artery, with additional moderate to severe bilateral V4 stenoses as above, right worse than left. Atheromatous stenoses about the carotid bifurcations with up to 50% narrowing on the left and mild 25% narrowing on the right.  4. Moderate atherosclerotic change throughout the carotid siphons with associated moderate diffuse narrowing.  5. Small focus of patchy enhancement involving the anterior right frontal cortex as above, indeterminate, but could reflect the sequelae of subacute ischemia. Correlation with brain MRI recommended.    Ct Head Wo Contrast 04/13/2018 IMPRESSION:  1. Small hypodensities in the left thalamus suggesting age indeterminate lacunar infarcts. Negative for acute intracranial hemorrhage.  2. Atrophy and small vessel ischemic changes of the white matter.    Mr Brain Wo Contrast 04/14/2018 IMPRESSION:  1. 11 mm acute ischemic nonhemorrhagic left thalamic infarct, with slight inferior extension into the left cerebral peduncle/midbrain. 2. Few additional scattered acute to subacute infarcts involving the right frontal and parietal lobes as well as the left occipital lobe as above. There is associated petechial hemorrhage about the subacute right frontal infarct, which corresponds with patchy enhancement seen on prior CTA.  3. Underlying atrophy with moderate chronic microvascular ischemic disease.     Transthoracic Echocardiogram  04/14/2018 IMPRESSIONS 1. The left ventricle has normal systolic function with an ejection fraction of 60-65%. The cavity size was normal. Left ventricular diastolic Doppler parameters are consistent with impaired relaxation. 2. The right ventricle has normal systolic function. The cavity was normal. There is no increase in right ventricular wall thickness. 3. Moderate  sclerosis of the aortic valve. no stenosis of the aortic valve. 4. The inferior vena cava was normal in size with <50% respiratory variability. 5. No definite intracardiac source of emboli. 6. Suboptimal image quality, however no evidence of right to left shunt with agitated saline injection in subcostal view. Negative contrast in RA does not exclude possible left to right shunt. No evidence of interatrial shunt by color flow Doppler.   ASSESSMENT: Alan Rubio is a 85 y.o. year old male here with multiple bilateral infarcts including left thalamic and scattered right frontal with small hemorrhagic conversion and right MCA/ACA and left MCA/PCA embolic infarcts secondary to unknown source on 04/13/2018. Vascular risk factors include DM, HTN, HLD and prior history of stroke  per imaging.  Recommended 30-day cardiac event monitor to rule out atrial fibrillation and if negative, consider loop recorder for long-term monitoring.  He has recovered well from a stroke standpoint without residual deficits or reoccurring symptoms.    PLAN:  1. Multiple embolic infarcts: Recommend restart aspirin 81 mg daily  and continue atorvastatin for secondary stroke prevention.  He requests speaking to his PCP prior to restarting aspirin 81 mg due to history of melena.  As he was stable with recent use of DAPT and then aspirin alone, he should be safe at this time to restart but did advise to monitor closely for recurrence of melena.  He will follow-up with his PCP today as he does have an office visit regarding safety of restarting aspirin 81 mg daily.  Maintain strict control of hypertension with blood pressure goal below 130/90, diabetes with hemoglobin A1c goal below 6.5% and cholesterol with LDL cholesterol (bad cholesterol) goal below 70 mg/dL.  I also advised the patient to eat a healthy diet with plenty of whole grains, cereals, fruits and vegetables, exercise regularly with at least 30 minutes of continuous activity  daily and maintain ideal body weight. 2. HTN: Advised to continue current treatment regimen.  Advised to continue to monitor at home along with continued follow-up with PCP for management 3. HLD: Advised to continue current treatment regimen along with continued follow-up with PCP for future prescribing and monitoring of lipid panel 4. DMII: Advised to continue to monitor glucose levels at home along with continued follow-up with PCP for management and monitoring 5. Referral placed to EP for 30-day cardiac event monitor.  If negative, recommend loop recorder for long-term monitoring of atrial fibrillation     Follow up in 3 months or call earlier if needed   Greater than 50% of time during this 45 minute non-face-to-face visit was spent on counseling, explanation of diagnosis of multiple embolic infarcts, reviewing risk factor management of HTN, HLD and DM, discussion regarding use of aspirin 81 mg stroke prevention, education regarding use of 30-day cardiac event monitor, planning of further management along with potential future management, and discussion with patient and family answering all questions to patient satisfaction.    Venancio Poisson, AGNP-BC  [facility] [address] [place], [place] [address]  Phone [phone] Fax [phone] Note: This document was prepared with digital dictation and possible smart phrase technology. Any transcriptional errors that result from this process are unintentional.

## 2018-06-06 NOTE — Progress Notes (Signed)
I agree with the above plan 

## 2018-06-07 ENCOUNTER — Telehealth: Payer: Self-pay | Admitting: *Deleted

## 2018-06-07 NOTE — Telephone Encounter (Signed)
[employer] to ship a 30 day cardiac event monitor to his home.  Patient request brief instructions to be given to his wife due to memory issues.  Instructions reviewed briefly as they are also included in monitor kit.

## 2018-06-08 ENCOUNTER — Telehealth: Payer: Self-pay | Admitting: Adult Health

## 2018-06-08 DIAGNOSIS — E119 Type 2 diabetes mellitus without complications: Secondary | ICD-10-CM | POA: Diagnosis not present

## 2018-06-08 DIAGNOSIS — I69398 Other sequelae of cerebral infarction: Secondary | ICD-10-CM | POA: Diagnosis not present

## 2018-06-08 DIAGNOSIS — I69393 Ataxia following cerebral infarction: Secondary | ICD-10-CM | POA: Diagnosis not present

## 2018-06-08 DIAGNOSIS — I951 Orthostatic hypotension: Secondary | ICD-10-CM | POA: Diagnosis not present

## 2018-06-08 DIAGNOSIS — D649 Anemia, unspecified: Secondary | ICD-10-CM | POA: Diagnosis not present

## 2018-06-08 DIAGNOSIS — H532 Diplopia: Secondary | ICD-10-CM | POA: Diagnosis not present

## 2018-06-08 NOTE — Telephone Encounter (Signed)
I called Alan Rubio at Dr.Thackers office about the referral for pt at cardiology. Tammi Klippel stated it is taken care of. Pt referral was sent to [facility] cardiovascular office by her. She appreciate the call back.

## 2018-06-08 NOTE — Telephone Encounter (Signed)
Alan Rubio from Dr. March Rummage office is needing someone to call her about a referral Venancio Poisson, NP sent to [facility] heart please call Alan Rubio [phone]

## 2018-06-10 DIAGNOSIS — I69398 Other sequelae of cerebral infarction: Secondary | ICD-10-CM | POA: Diagnosis not present

## 2018-06-10 DIAGNOSIS — I69393 Ataxia following cerebral infarction: Secondary | ICD-10-CM | POA: Diagnosis not present

## 2018-06-10 DIAGNOSIS — D649 Anemia, unspecified: Secondary | ICD-10-CM | POA: Diagnosis not present

## 2018-06-10 DIAGNOSIS — I951 Orthostatic hypotension: Secondary | ICD-10-CM | POA: Diagnosis not present

## 2018-06-10 DIAGNOSIS — E119 Type 2 diabetes mellitus without complications: Secondary | ICD-10-CM | POA: Diagnosis not present

## 2018-06-10 DIAGNOSIS — H532 Diplopia: Secondary | ICD-10-CM | POA: Diagnosis not present

## 2018-06-13 ENCOUNTER — Ambulatory Visit (INDEPENDENT_AMBULATORY_CARE_PROVIDER_SITE_OTHER): Payer: Medicare Other | Admitting: Cardiology

## 2018-06-13 ENCOUNTER — Encounter: Payer: Self-pay | Admitting: Cardiology

## 2018-06-13 ENCOUNTER — Other Ambulatory Visit: Payer: Self-pay

## 2018-06-13 VITALS — BP 162/79 | HR 83 | Ht 69.0 in | Wt 175.0 lb

## 2018-06-13 DIAGNOSIS — I10 Essential (primary) hypertension: Secondary | ICD-10-CM

## 2018-06-13 DIAGNOSIS — E118 Type 2 diabetes mellitus with unspecified complications: Secondary | ICD-10-CM | POA: Diagnosis not present

## 2018-06-13 DIAGNOSIS — R0989 Other specified symptoms and signs involving the circulatory and respiratory systems: Secondary | ICD-10-CM

## 2018-06-13 DIAGNOSIS — K921 Melena: Secondary | ICD-10-CM | POA: Diagnosis not present

## 2018-06-13 DIAGNOSIS — I749 Embolism and thrombosis of unspecified artery: Secondary | ICD-10-CM | POA: Diagnosis not present

## 2018-06-13 DIAGNOSIS — E7800 Pure hypercholesterolemia, unspecified: Secondary | ICD-10-CM

## 2018-06-13 DIAGNOSIS — E039 Hypothyroidism, unspecified: Secondary | ICD-10-CM

## 2018-06-13 DIAGNOSIS — I639 Cerebral infarction, unspecified: Secondary | ICD-10-CM | POA: Diagnosis not present

## 2018-06-13 DIAGNOSIS — E785 Hyperlipidemia, unspecified: Secondary | ICD-10-CM

## 2018-06-13 HISTORY — DX: Hyperlipidemia, unspecified: E78.5

## 2018-06-13 HISTORY — DX: Hypothyroidism, unspecified: E03.9

## 2018-06-13 MED ORDER — METOPROLOL SUCCINATE ER 25 MG PO TB24: 25.0000 mg | ORAL_TABLET | Freq: Every day | ORAL | 1 refills | Status: DC

## 2018-06-13 MED ORDER — PANTOPRAZOLE SODIUM 40 MG PO TBEC: 40.0000 mg | DELAYED_RELEASE_TABLET | Freq: Every day | ORAL | 1 refills | Status: DC

## 2018-06-13 NOTE — Progress Notes (Signed)
Primary Physician/Referring:  [name], MD  Patient ID: Alan Rubio, male    DOB: 1931-10-30, 85 y.o.   MRN: [id number]  Chief Complaint  Patient presents with  . Hypertension  . New Patient (Initial Visit)    HPI: Alan Rubio  is a 85 y.o. male  with history of type 2 diabetes mellitus, hypertension, hypothyroidism, and melenawho presented on 04/13/2018 with multiple falls, recent diplopia and listing to the right.Hedid not receive IV t-PA due to late presentation.  Stroke work-up revealed multiple bilateral infarcts including left thalamic and scattered right frontal and small hemorrhagic conversion and right MCA/ACA and left MCA/PCA with embolic etiology secondary to unknown source.  CTA head and neck showed multifocal intracranial stenosis. Negative echocardiogram.  He has completed 3 weeks of plavix, and is now on ASA therapy. He had previously had melena a few weeks prior to his CVA and had stopped his ASA. He has not had any recurrent melena since being back on ASA. He has pending appt with [facility]; however, wife has not contacted them yet for appt.  Since his stroke, he had done fairly well. Has some residual gait instability, diplopia to right eye, and difficulty expressing his words. No new symptoms since his CVA. He has been evaluated by Venancio Poisson, NP since discharge. He has completed PT.   He denies any episodes of chest pain or shortness of breath. Denies any symptoms of claudication. His wife states that she noticed a change in his gait prior to his CVA. No palpitations or heart racing. Has chronic leg edema that has been stable.  States that his blood pressure has been up and down since his CVA. Reports that hyperlipidemia and diabetes are fairly well controlled. No former tobacco use. Occasional alcohol use, socially. Wife is present at bedside.  Past Medical History:  Diagnosis Date  . Cancer ([place])   . Diabetes mellitus without complication ([place])   .  Hyperlipemia 06/13/2018  . Hypertension   . Hypothyroidism 06/13/2018  . Stroke ([place])   . Thyroid disease     Past Surgical History:  Procedure Laterality Date  . BACK SURGERY      Social History   Socioeconomic History  . Marital status: Married    Spouse name: Not on file  . Number of children: 1  . Years of education: Not on file  . Highest education level: Not on file  Occupational History  . Not on file  Social Needs  . Financial resource strain: Not on file  . Food insecurity:    Worry: Not on file    Inability: Not on file  . Transportation needs:    Medical: Not on file    Non-medical: Not on file  Tobacco Use  . Smoking status: Never Smoker  . Smokeless tobacco: Never Used  Substance and Sexual Activity  . Alcohol use: Not Currently  . Drug use: Never  . Sexual activity: Not on file  Lifestyle  . Physical activity:    Days per week: Not on file    Minutes per session: Not on file  . Stress: Not on file  Relationships  . Social connections:    Talks on phone: Not on file    Gets together: Not on file    Attends religious service: Not on file    Active member of club or organization: Not on file    Attends meetings of clubs or organizations: Not on file    Relationship status: Not on  file  . Intimate partner violence:    Fear of current or ex partner: Not on file    Emotionally abused: Not on file    Physically abused: Not on file    Forced sexual activity: Not on file  Other Topics Concern  . Not on file  Social History Narrative  . Not on file    Current Outpatient Medications on File Prior to Visit  Medication Sig Dispense Refill  . allopurinol (ZYLOPRIM) 300 MG tablet Take 300 mg by mouth daily.    Marland Kitchen aspirin EC 81 MG tablet Take 81 mg by mouth daily.    Marland Kitchen atorvastatin (LIPITOR) 40 MG tablet Take 1 tablet (40 mg total) by mouth daily at 6 PM. 30 tablet 0  . doxazosin (CARDURA) 8 MG tablet Take 8 mg by mouth daily. 1/2 QD    . levothyroxine  (SYNTHROID, LEVOTHROID) 75 MCG tablet Take 75 mcg by mouth daily before breakfast.    . losartan (COZAAR) 25 MG tablet Take 25 mg by mouth daily.    . pioglitazone (ACTOS) 15 MG tablet Take 15 mg by mouth daily.    Vladimir Faster Glycol-Propyl Glycol (SYSTANE) 0.4-0.3 % SOLN Place 1 drop into both eyes 3 (three) times daily as needed (dry eyes).     No current facility-administered medications on file prior to visit.     Review of Systems  Constitution: Positive for decreased appetite and malaise/fatigue. Negative for weight gain and weight loss.  Eyes: Positive for blurred vision (blurred occasional in right). Negative for visual disturbance.  Cardiovascular: Negative for chest pain, claudication, dyspnea on exertion, leg swelling, orthopnea, palpitations and syncope.  Respiratory: Negative for hemoptysis and wheezing.   Endocrine: Negative for cold intolerance and heat intolerance.  Hematologic/Lymphatic: Does not bruise/bleed easily.  Skin: Negative for nail changes.  Musculoskeletal: Negative for falls, muscle weakness and myalgias.  Gastrointestinal: Negative for abdominal pain, change in bowel habit, nausea and vomiting.  Neurological: Positive for disturbances in coordination. Negative for difficulty with concentration, dizziness, focal weakness and headaches.  Psychiatric/Behavioral: Positive for memory loss. Negative for altered mental status and suicidal ideas.       Difficulty expressing words  All other systems reviewed and are negative.     Objective  Blood pressure (!) 162/79, pulse 83, height 5\' 9"  (1.753 m), weight 175 lb (79.4 kg), SpO2 97 %. Body mass index is 25.84 kg/m.    Physical Exam  Constitutional: He is oriented to person, place, and time. Vital signs are normal. He appears well-developed and well-nourished.  HENT:  Head: Normocephalic and atraumatic.  Neck: Normal range of motion.  Cardiovascular: Normal rate, regular rhythm, normal heart sounds and intact  distal pulses.  Pulmonary/Chest: Effort normal and breath sounds normal. No accessory muscle usage. No respiratory distress.  Abdominal: Soft. Bowel sounds are normal.  Musculoskeletal: Normal range of motion.  Neurological: He is alert and oriented to person, place, and time.  Skin: Skin is warm and dry.  Vitals reviewed.  Radiology: MRI brain 04/14/2018:  1. 11 mm acute ischemic nonhemorrhagic left thalamic infarct, with slight inferior extension into the left cerebral peduncle/midbrain. 2. Few additional scattered acute to subacute infarcts involving the right frontal and parietal lobes as well as the left occipital lobe as above. There is associated petechial hemorrhage about the subacute right frontal infarct, which corresponds with patchy enhancement seen on prior CTA. 3. Underlying atrophy with moderate chronic microvascular ischemic Disease.  CTA of head 04/14/2018:  1. Negative CTA  for large vessel occlusion. 2. Multifocal moderate to severe left P1 and P2 stenoses without vascular occlusion. 3. Severe atheromatous stenosis at the origin of the right vertebral artery, with additional moderate to severe bilateral V4 stenoses as above, right worse than left. Atheromatous stenoses about the carotid bifurcations with up to 50% narrowing on the left and mild 25% narrowing on the right. 4. Moderate atherosclerotic change throughout the carotid siphons with associated moderate diffuse narrowing. 5. Small focus of patchy enhancement involving the anterior right frontal cortex as above, indeterminate, but could reflect the sequelae of subacute ischemia. Correlation with brain MRI recommended.    Laboratory examination:    CMP Latest Ref Rng & Units 04/17/2018 04/15/2018 04/13/2018  Glucose 70 - 99 mg/dL 153(H) 156(H) 170(H)  BUN 8 - 23 mg/dL 24(H) 13 23  Creatinine 0.61 - 1.24 mg/dL 1.18 0.81 0.90  Sodium 135 - 145 mmol/L 137 137 137  Potassium 3.5 - 5.1 mmol/L 4.3 3.7 3.9   Chloride 98 - 111 mmol/L 102 104 107  CO2 22 - 32 mmol/L 25 23 22   Calcium 8.9 - 10.3 mg/dL 9.6 9.7 8.9  Total Protein 6.5 - 8.1 g/dL 6.6 - 6.5  Total Bilirubin 0.3 - 1.2 mg/dL 0.8 - 0.6  Alkaline Phos 38 - 126 U/L 44 - 43  AST 15 - 41 U/L 49(H) - 29  ALT 0 - 44 U/L 34 - 22   CBC Latest Ref Rng & Units 04/17/2018 04/15/2018 04/13/2018  WBC 4.0 - 10.5 K/uL 7.1 7.1 6.5  Hemoglobin 13.0 - 17.0 g/dL 12.7(L) 13.4 12.2(L)  Hematocrit 39.0 - 52.0 % 39.2 39.8 36.4(L)  Platelets 150 - 400 K/uL 204 191 182   Lipid Panel     Component Value Date/Time   CHOL 143 04/14/2018 0955   TRIG 98 04/14/2018 0955   HDL 39 (L) 04/14/2018 0955   CHOLHDL 3.7 04/14/2018 0955   VLDL 20 04/14/2018 0955   LDLCALC 84 04/14/2018 0955   HEMOGLOBIN A1C Lab Results  Component Value Date   HGBA1C 7.9 (H) 04/14/2018   MPG 180.03 04/14/2018   TSH No results for input(s): TSH in the last 8760 hours.  Cardiac Studies:   Transthoracic Echocardiogram 04/14/2018 IMPRESSIONS 1. The left ventricle has normal systolic function with an ejection fraction of 60-65%.The cavity size was normal. Left ventricular diastolic Doppler parameters are consistent with impaired relaxation. 2. The right ventricle has normal systolic function. The cavity was normal. There is no increase in right ventricular wall thickness. 3. Moderate sclerosis of the aortic valve. no stenosis of the aortic valve. 4. The inferior vena cava was normal in size with <50% respiratory variability. 5. No definite intracardiac source of emboli. 6. Suboptimal image quality, however no evidence of right to left shunt with agitated saline injection in subcostal view. Negative contrast in RA does not exclude possible left to right shunt. No evidence of interatrial shunt by color flow Doppler.  Assessment   Cryptogenic stroke ([place])  Essential hypertension - Plan: EKG 12-Lead  Pure hypercholesterolemia  Melena - Plan: pantoprazole (PROTONIX) 40 MG  tablet  Type 2 diabetes mellitus with complication, without long-term current use of insulin (HCC)  Abdominal bruit  EKG 06/13/2018: Normal sinus rhythm at 75 bpm with 1 PAC, normal axis, no evidence of ischemia.    Recommendations:   Patient has recently been found to have CVA with multiple bilateral infarcts of embolic etiology from unknown source. He has had negative echocardiogram. My suspicion for atrial fibrillation is  high given his multiple risk factors. Feel that patient would best benefit from loop recorder implantation for long term surveillance of A fib. Indications, risk and benefits, and details of the procedure were discussed with the patient and his wife. They both agree and wish to proceed with the procedure. Will plan for in office loop recorder implantation in the next few days.  I have advised patient to contact GI's office to schedule a appt ASAP for evaluation given his history of melena. If he has atrial fibrillation on loop recorder, will need to decide if he should be on anticoagulation; therefore, would like to have their opinion and evaluation for etiolgy for melena. I will start him on Protonix 40 mg daily for now. He has not had recurrent melena since being back on ASA. Will continue, but encouraged him to contact us should he have any reoccurrence. He is on statin therapy. Diabetes is slightly uncontrolled, diet modifications to help with controlling this was discussed. Importance of controlling risk factors was stressed to the patient. He has not had ischemic workup, but is asymptomatic at this time. Do not feel he needs stress test at this point. I am overall impressed with his physical well being despite his advanced age.   Blood pressure has been uncontrolled recently. Will start metoprolol succinate 25 mg daily. Continue with losartan. He is noted to have abdominal bruit on exam without evidence of pulsating mass. He will need aortic duplex at some point for  evaluation. Will arrange at his next office visit. I will see him back in the office after loop recorder implantation.   *I have discussed this case with Dr. Einar Gip and he personally examined the patient and participated in formulating the plan.*   Miquel Dunn, MSN, APRN, FNP-C [facility]. [place] Office: [phone] Fax: [phone]

## 2018-06-14 ENCOUNTER — Encounter: Payer: Self-pay | Admitting: Cardiology

## 2018-06-14 DIAGNOSIS — R197 Diarrhea, unspecified: Secondary | ICD-10-CM | POA: Diagnosis not present

## 2018-06-15 NOTE — Progress Notes (Signed)
   Primary Physician:  [name], MD   Patient ID: Alan Rubio, male    DOB: [birth year], 85 y.o.   MRN: [id number]    Prodedure performed: Insertion of Biotronik Loop Recorder. Serial # [id number] (PID: [id number])  Indication: Multiple embolic CVA of unknown etiology.   After obtaining informed consent, explaining the procedure to the patient, under sterile precautions, local anesthesia with 1% lidocaine with epinephrine was injected subcutaneously in the left parasternal region.  20 mL utilized.  A small nick was made in the left paraspinal region at the 3rd intercostal space.  Biotronik loop recorder was inserted without any complications.  Patient tolerated the procedure well.  The incision was closed with Steri-Strips.  There is no blood loss, no hematoma.  Settings:     Atrial fibrillation: Medium    High Ventricular rate (bpm): 160    Bradycardia (bpm): 40    Sudden rate drop (%): 50    Asystole: 3  There were no vitals taken for this visit. There is no height or weight on file to calculate BMI.  Written instrtuctions regarding wound care given to the patient. Patient will follow up in approximately 10 days for device and wound check.

## 2018-06-16 ENCOUNTER — Ambulatory Visit (INDEPENDENT_AMBULATORY_CARE_PROVIDER_SITE_OTHER): Payer: Medicare Other | Admitting: Cardiology

## 2018-06-16 ENCOUNTER — Other Ambulatory Visit: Payer: Self-pay

## 2018-06-16 ENCOUNTER — Encounter: Payer: Self-pay | Admitting: Cardiology

## 2018-06-16 DIAGNOSIS — Z95818 Presence of other cardiac implants and grafts: Secondary | ICD-10-CM

## 2018-06-16 DIAGNOSIS — I639 Cerebral infarction, unspecified: Secondary | ICD-10-CM

## 2018-06-16 HISTORY — DX: Presence of other cardiac implants and grafts: Z95.818

## 2018-06-26 ENCOUNTER — Encounter: Payer: Self-pay | Admitting: Cardiology

## 2018-06-26 ENCOUNTER — Other Ambulatory Visit: Payer: Self-pay

## 2018-06-26 ENCOUNTER — Ambulatory Visit (INDEPENDENT_AMBULATORY_CARE_PROVIDER_SITE_OTHER): Payer: Medicare Other | Admitting: Cardiology

## 2018-06-26 VITALS — BP 121/62 | HR 73 | Temp 96.9°F | Ht 71.0 in | Wt 174.2 lb

## 2018-06-26 DIAGNOSIS — Z95818 Presence of other cardiac implants and grafts: Secondary | ICD-10-CM

## 2018-06-26 DIAGNOSIS — I10 Essential (primary) hypertension: Secondary | ICD-10-CM

## 2018-06-26 DIAGNOSIS — I749 Embolism and thrombosis of unspecified artery: Secondary | ICD-10-CM

## 2018-06-26 DIAGNOSIS — I6340 Cerebral infarction due to embolism of unspecified cerebral artery: Secondary | ICD-10-CM | POA: Diagnosis not present

## 2018-06-26 MED ORDER — METOPROLOL SUCCINATE ER 25 MG PO TB24: 25.0000 mg | ORAL_TABLET | Freq: Every day | ORAL | 1 refills | Status: DC

## 2018-06-26 NOTE — Progress Notes (Signed)
Primary Physician/Referring:  [name], MD  Patient ID: Alan Rubio, male    DOB: 12/19/1931, 85 y.o.   MRN: [id number]  Chief Complaint  Patient presents with  . Cerebrovascular Accident  . Hypertension  . Follow-up    Loop Implantation    HPI: Alan Rubio  is a 85 y.o. male  with history of controlled type 2 diabetes mellitus, hypertension, hypothyroidism, and melena for 2-3 weeks which resolved just about a week before he presented with stroke on 04/13/2018 with multiple bilateral infarcts including left thalamic and scattered right frontal and small hemorrhagic conversion and right MCA/ACA and left MCA/PCA with embolic etiology secondary to unknown source.  CTA head and neck showed multifocal intracranial stenosis. Negative echocardiogram.  He has completed 3 weeks of plavix, and is now on ASA therapy. He had previously had melena a few weeks prior to his CVA and had stopped his ASA. He has not had any recurrent melena since being back on ASA.  Since his stroke, he had done fairly well. Has some residual gait instability, diplopia to right eye, and difficulty expressing his words. No new symptoms since his CVA. He has been evaluated by Venancio Poisson, NP since discharge. He has completed PT.  He underwent glucagon implantation on 06/16/2018, was also started on metoprolol succinate for elevated blood pressure and presents for follow-up.  No complication from the procedure. Wife is present at bedside.  Past Medical History:  Diagnosis Date  . A-fib ([place])   . Cancer ([place])   . Diabetes mellitus without complication ([place])   . Hyperlipemia 06/13/2018  . Hypertension   . Hypothyroidism 06/13/2018  . Stroke ([place])   . Thyroid disease     Past Surgical History:  Procedure Laterality Date  . BACK SURGERY    . LOOP RECORDER IMPLANT      Social History   Socioeconomic History  . Marital status: Married    Spouse name: Not on file  . Number of children: 1  . Years of  education: Not on file  . Highest education level: Not on file  Occupational History  . Not on file  Social Needs  . Financial resource strain: Not on file  . Food insecurity:    Worry: Not on file    Inability: Not on file  . Transportation needs:    Medical: Not on file    Non-medical: Not on file  Tobacco Use  . Smoking status: Never Smoker  . Smokeless tobacco: Never Used  Substance and Sexual Activity  . Alcohol use: Not Currently  . Drug use: Never  . Sexual activity: Not on file  Lifestyle  . Physical activity:    Days per week: Not on file    Minutes per session: Not on file  . Stress: Not on file  Relationships  . Social connections:    Talks on phone: Not on file    Gets together: Not on file    Attends religious service: Not on file    Active member of club or organization: Not on file    Attends meetings of clubs or organizations: Not on file    Relationship status: Not on file  . Intimate partner violence:    Fear of current or ex partner: Not on file    Emotionally abused: Not on file    Physically abused: Not on file    Forced sexual activity: Not on file  Other Topics Concern  . Not on file  Social History  Narrative  . Not on file    Current Outpatient Medications on File Prior to Visit  Medication Sig Dispense Refill  . allopurinol (ZYLOPRIM) 300 MG tablet Take 300 mg by mouth daily.    Marland Kitchen aspirin EC 81 MG tablet Take 81 mg by mouth daily.    Marland Kitchen atorvastatin (LIPITOR) 40 MG tablet Take 1 tablet (40 mg total) by mouth daily at 6 PM. 30 tablet 0  . doxazosin (CARDURA) 8 MG tablet Take 8 mg by mouth daily. 1/2 QD    . levothyroxine (SYNTHROID, LEVOTHROID) 75 MCG tablet Take 75 mcg by mouth daily before breakfast.    . losartan (COZAAR) 25 MG tablet Take 25 mg by mouth daily.    . pantoprazole (PROTONIX) 40 MG tablet Take 1 tablet (40 mg total) by mouth daily before breakfast. 30 tablet 1  . pioglitazone (ACTOS) 15 MG tablet Take 15 mg by mouth daily.     Vladimir Faster Glycol-Propyl Glycol (SYSTANE) 0.4-0.3 % SOLN Place 1 drop into both eyes 3 (three) times daily as needed (dry eyes).     No current facility-administered medications on file prior to visit.     Review of Systems  Constitution: Positive for decreased appetite and malaise/fatigue. Negative for weight gain and weight loss.  Eyes: Positive for blurred vision (blurred occasional in right). Negative for visual disturbance.  Cardiovascular: Negative for chest pain, claudication, dyspnea on exertion, leg swelling, orthopnea, palpitations and syncope.  Respiratory: Negative for hemoptysis and wheezing.   Endocrine: Negative for cold intolerance and heat intolerance.  Hematologic/Lymphatic: Does not bruise/bleed easily.  Skin: Negative for nail changes.  Musculoskeletal: Negative for falls, muscle weakness and myalgias.  Gastrointestinal: Negative for abdominal pain, change in bowel habit, nausea and vomiting.  Neurological: Positive for disturbances in coordination. Negative for difficulty with concentration, dizziness, focal weakness and headaches.  Psychiatric/Behavioral: Positive for memory loss. Negative for altered mental status and suicidal ideas.       Difficulty expressing words  All other systems reviewed and are negative.     Objective  Blood pressure 121/62, pulse 73, temperature (!) 96.9 F (36.1 C), height 5\' 11"  (1.803 m), weight 174 lb 3.2 oz (79 kg), SpO2 98 %. Body mass index is 24.3 kg/m.    Physical Exam  Constitutional: He is oriented to person, place, and time. Vital signs are normal. He appears well-developed and well-nourished.  HENT:  Head: Normocephalic and atraumatic.  Neck: Normal range of motion.  Cardiovascular: Normal rate, regular rhythm, normal heart sounds, intact distal pulses and normal pulses.  Thick, rigid nails bilateral Acrocyanosis bilateral Venous stasis pigementation  Pulmonary/Chest: Effort normal and breath sounds normal. No  accessory muscle usage. No respiratory distress.  Abdominal: Soft. Bowel sounds are normal. He exhibits abdominal bruit.  Musculoskeletal: Normal range of motion.  Neurological: He is alert and oriented to person, place, and time.  Skin: Skin is warm and dry.  Vitals reviewed.  Left parasternal region loop recorder implantation site has healed well.  Radiology: MRI brain 04/14/2018:  1. 11 mm acute ischemic nonhemorrhagic left thalamic infarct, with slight inferior extension into the left cerebral peduncle/midbrain. 2. Few additional scattered acute to subacute infarcts involving the right frontal and parietal lobes as well as the left occipital lobe as above. There is associated petechial hemorrhage about the subacute right frontal infarct, which corresponds with patchy enhancement seen on prior CTA. 3. Underlying atrophy with moderate chronic microvascular ischemic Disease.  CTA of head 04/14/2018:  1. Negative CTA  for large vessel occlusion.  2. Multifocal moderate to severe left P1 and P2 stenoses without vascular occlusion. 3. Severe atheromatous stenosis at the origin of the right vertebral artery, with additional moderate to severe bilateral V4 stenoses as above, right worse than left. Atheromatous stenoses about the carotid bifurcations with up to 50% narrowing on the left and mild 25% narrowing on the right. 4. Moderate atherosclerotic change throughout the carotid siphons with associated moderate diffuse narrowing. 5. Small focus of patchy enhancement involving the anterior right frontal cortex as above, indeterminate, but could reflect the sequelae of subacute ischemia. Correlation with brain MRI recommended.  Laboratory examination:    CMP Latest Ref Rng & Units 04/17/2018 04/15/2018 04/13/2018  Glucose 70 - 99 mg/dL 153(H) 156(H) 170(H)  BUN 8 - 23 mg/dL 24(H) 13 23  Creatinine 0.61 - 1.24 mg/dL 1.18 0.81 0.90  Sodium 135 - 145 mmol/L 137 137 137  Potassium 3.5 - 5.1 mmol/L  4.3 3.7 3.9  Chloride 98 - 111 mmol/L 102 104 107  CO2 22 - 32 mmol/L 25 23 22   Calcium 8.9 - 10.3 mg/dL 9.6 9.7 8.9  Total Protein 6.5 - 8.1 g/dL 6.6 - 6.5  Total Bilirubin 0.3 - 1.2 mg/dL 0.8 - 0.6  Alkaline Phos 38 - 126 U/L 44 - 43  AST 15 - 41 U/L 49(H) - 29  ALT 0 - 44 U/L 34 - 22   CBC Latest Ref Rng & Units 04/17/2018 04/15/2018 04/13/2018  WBC 4.0 - 10.5 K/uL 7.1 7.1 6.5  Hemoglobin 13.0 - 17.0 g/dL 12.7(L) 13.4 12.2(L)  Hematocrit 39.0 - 52.0 % 39.2 39.8 36.4(L)  Platelets 150 - 400 K/uL 204 191 182   Lipid Panel     Component Value Date/Time   CHOL 143 04/14/2018 0955   TRIG 98 04/14/2018 0955   HDL 39 (L) 04/14/2018 0955   CHOLHDL 3.7 04/14/2018 0955   VLDL 20 04/14/2018 0955   LDLCALC 84 04/14/2018 0955   HEMOGLOBIN A1C Lab Results  Component Value Date   HGBA1C 7.9 (H) 04/14/2018   MPG 180.03 04/14/2018   TSH No results for input(s): TSH in the last 8760 hours.  Cardiac Studies:   Transthoracic Echocardiogram 04/14/2018 IMPRESSIONS 1. The left ventricle has normal systolic function with an ejection fraction of 60-65%.The cavity size was normal. Left ventricular diastolic Doppler parameters are consistent with impaired relaxation. 2. The right ventricle has normal systolic function. The cavity was normal. There is no increase in right ventricular wall thickness. 3. Moderate sclerosis of the aortic valve. no stenosis of the aortic valve. 4. The inferior vena cava was normal in size with <50% respiratory variability. 5. No definite intracardiac source of emboli. 6. Suboptimal image quality, however no evidence of right to left shunt with agitated saline injection in subcostal view. Negative contrast in RA does not exclude possible left to right shunt. No evidence of interatrial shunt by color flow Doppler.  Assessment   Essential hypertension - Plan: metoprolol succinate (TOPROL-XL) 25 MG 24 hr tablet  Status post placement of Biotronik implantable  loop recorder 06/16/2018: Cryptogenic stroke SN [id number]  Cerebrovascular accident (CVA) due to embolism of cerebral artery ([place])  EKG 06/13/2018: Normal sinus rhythm at 75 bpm with 1 PAC, normal axis, no evidence of ischemia.   Remote 06/20/2018: NSR  Recommendations:   Patient is on a 3-week follow-up visit, hypertension and stroke.  He has not had any recurrence of strokelike symptoms.  Continues to have gait instability and mild visual  impairment.  He is tolerating metoprolol succinate well and his blood pressure has been very well controlled.  Advised him to continue the same.  I again reviewed with him regarding the loop recorder data, he has not had any complication, the site appears very well-healed and healthy.  No restrictions from cardiac standpoint.  Continue aspirin indefinitely, he has not had any recurrence of dark stools.  He did visit GI, he was told that as his symptoms are completely resolved and his CBC is normal, no further evaluation is indicated at this time.  I would like to see him back in 6 months for follow-up.  [name], MD, [facility] 06/26/2018, 1:23 PM [place] [facility]. [place] Pager: [phone] Office: [phone] If no answer Cell [phone]

## 2018-07-18 DIAGNOSIS — H6123 Impacted cerumen, bilateral: Secondary | ICD-10-CM | POA: Diagnosis not present

## 2018-07-18 DIAGNOSIS — H903 Sensorineural hearing loss, bilateral: Secondary | ICD-10-CM | POA: Diagnosis not present

## 2018-07-18 DIAGNOSIS — H838X3 Other specified diseases of inner ear, bilateral: Secondary | ICD-10-CM | POA: Diagnosis not present

## 2018-07-20 ENCOUNTER — Encounter: Payer: Self-pay | Admitting: Cardiology

## 2018-07-21 DIAGNOSIS — I4891 Unspecified atrial fibrillation: Secondary | ICD-10-CM | POA: Diagnosis not present

## 2018-07-21 DIAGNOSIS — Z4509 Encounter for adjustment and management of other cardiac device: Secondary | ICD-10-CM | POA: Diagnosis not present

## 2018-07-21 DIAGNOSIS — Z95818 Presence of other cardiac implants and grafts: Secondary | ICD-10-CM | POA: Diagnosis not present

## 2018-08-01 DIAGNOSIS — R531 Weakness: Secondary | ICD-10-CM | POA: Diagnosis not present

## 2018-08-03 ENCOUNTER — Other Ambulatory Visit: Payer: Self-pay | Admitting: Cardiology

## 2018-08-03 DIAGNOSIS — K921 Melena: Secondary | ICD-10-CM

## 2018-08-18 ENCOUNTER — Telehealth: Payer: Self-pay

## 2018-08-18 DIAGNOSIS — Z4509 Encounter for adjustment and management of other cardiac device: Secondary | ICD-10-CM | POA: Diagnosis not present

## 2018-08-18 DIAGNOSIS — Z95818 Presence of other cardiac implants and grafts: Secondary | ICD-10-CM | POA: Diagnosis not present

## 2018-08-18 DIAGNOSIS — I4891 Unspecified atrial fibrillation: Secondary | ICD-10-CM | POA: Diagnosis not present

## 2018-08-18 NOTE — Telephone Encounter (Signed)
Should talk to PCP and se any meds can help.

## 2018-08-18 NOTE — Telephone Encounter (Signed)
-----   Message from [name], MD sent at 08/18/2018  7:19 AM EDT ----- Regarding: Loop recorder Remote loop recorder check 08/15/2018:  Uneventful monitoring without heart block or A. Fibrillation.

## 2018-08-18 NOTE — Telephone Encounter (Signed)
Wife is aware; She says since the stroke he just sits around no interests in anything doesn't want to do anything and she is concerned if this is going to be how he will be from now on

## 2018-08-21 DIAGNOSIS — E1142 Type 2 diabetes mellitus with diabetic polyneuropathy: Secondary | ICD-10-CM | POA: Diagnosis not present

## 2018-08-21 DIAGNOSIS — E785 Hyperlipidemia, unspecified: Secondary | ICD-10-CM | POA: Diagnosis not present

## 2018-08-21 DIAGNOSIS — E039 Hypothyroidism, unspecified: Secondary | ICD-10-CM | POA: Diagnosis not present

## 2018-08-21 DIAGNOSIS — I10 Essential (primary) hypertension: Secondary | ICD-10-CM | POA: Diagnosis not present

## 2018-08-21 DIAGNOSIS — Z5181 Encounter for therapeutic drug level monitoring: Secondary | ICD-10-CM | POA: Diagnosis not present

## 2018-08-22 DIAGNOSIS — R630 Anorexia: Secondary | ICD-10-CM | POA: Diagnosis not present

## 2018-08-22 DIAGNOSIS — E1142 Type 2 diabetes mellitus with diabetic polyneuropathy: Secondary | ICD-10-CM | POA: Diagnosis not present

## 2018-08-22 DIAGNOSIS — R531 Weakness: Secondary | ICD-10-CM | POA: Diagnosis not present

## 2018-08-22 DIAGNOSIS — E1165 Type 2 diabetes mellitus with hyperglycemia: Secondary | ICD-10-CM | POA: Diagnosis not present

## 2018-08-22 NOTE — Telephone Encounter (Signed)
Pt wife aware

## 2018-09-05 DIAGNOSIS — Z5181 Encounter for therapeutic drug level monitoring: Secondary | ICD-10-CM | POA: Diagnosis not present

## 2018-09-05 DIAGNOSIS — E119 Type 2 diabetes mellitus without complications: Secondary | ICD-10-CM | POA: Diagnosis not present

## 2018-09-15 ENCOUNTER — Telehealth: Payer: Self-pay

## 2018-09-15 ENCOUNTER — Encounter: Payer: Self-pay | Admitting: Cardiology

## 2018-09-15 DIAGNOSIS — Z4509 Encounter for adjustment and management of other cardiac device: Secondary | ICD-10-CM | POA: Insufficient documentation

## 2018-09-15 HISTORY — DX: Encounter for adjustment and management of other cardiac device: Z45.09

## 2018-09-15 NOTE — Telephone Encounter (Signed)
Unable to leave vm about transmission due to vm being full

## 2018-09-15 NOTE — Telephone Encounter (Signed)
-----   Message from [name], MD sent at 09/15/2018  6:16 AM EDT ----- Regarding: Loop recorder Remote loop recorder check 087/18/2020:  Uneventful monitoring without heart block or A. Fibrillation.

## 2018-09-18 DIAGNOSIS — I4891 Unspecified atrial fibrillation: Secondary | ICD-10-CM | POA: Diagnosis not present

## 2018-09-18 DIAGNOSIS — Z4509 Encounter for adjustment and management of other cardiac device: Secondary | ICD-10-CM | POA: Diagnosis not present

## 2018-09-18 DIAGNOSIS — Z95818 Presence of other cardiac implants and grafts: Secondary | ICD-10-CM | POA: Diagnosis not present

## 2018-09-28 ENCOUNTER — Other Ambulatory Visit: Payer: Self-pay | Admitting: Cardiology

## 2018-09-28 DIAGNOSIS — K921 Melena: Secondary | ICD-10-CM

## 2018-10-06 DIAGNOSIS — Z23 Encounter for immunization: Secondary | ICD-10-CM | POA: Diagnosis not present

## 2018-10-06 DIAGNOSIS — R300 Dysuria: Secondary | ICD-10-CM | POA: Diagnosis not present

## 2018-10-06 DIAGNOSIS — R8290 Unspecified abnormal findings in urine: Secondary | ICD-10-CM | POA: Diagnosis not present

## 2018-10-06 DIAGNOSIS — N481 Balanitis: Secondary | ICD-10-CM | POA: Diagnosis not present

## 2018-10-06 DIAGNOSIS — R0602 Shortness of breath: Secondary | ICD-10-CM | POA: Diagnosis not present

## 2018-10-10 ENCOUNTER — Other Ambulatory Visit (HOSPITAL_BASED_OUTPATIENT_CLINIC_OR_DEPARTMENT_OTHER): Payer: Self-pay | Admitting: Family Medicine

## 2018-10-10 DIAGNOSIS — R9389 Abnormal findings on diagnostic imaging of other specified body structures: Secondary | ICD-10-CM

## 2018-10-17 ENCOUNTER — Ambulatory Visit (HOSPITAL_BASED_OUTPATIENT_CLINIC_OR_DEPARTMENT_OTHER)
Admission: RE | Admit: 2018-10-17 | Discharge: 2018-10-17 | Disposition: A | Discharge status: Home / Self Care | Payer: Medicare Other | Source: Ambulatory Visit | Attending: Family Medicine | Admitting: Family Medicine

## 2018-10-17 ENCOUNTER — Other Ambulatory Visit: Payer: Self-pay

## 2018-10-17 DIAGNOSIS — R9389 Abnormal findings on diagnostic imaging of other specified body structures: Secondary | ICD-10-CM | POA: Diagnosis not present

## 2018-10-17 DIAGNOSIS — R911 Solitary pulmonary nodule: Secondary | ICD-10-CM | POA: Diagnosis not present

## 2018-10-17 DIAGNOSIS — J948 Other specified pleural conditions: Secondary | ICD-10-CM | POA: Diagnosis not present

## 2018-10-19 ENCOUNTER — Telehealth: Payer: Self-pay

## 2018-10-19 NOTE — Telephone Encounter (Signed)
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 Continue cholesterol medications and Aspirin.

## 2018-10-19 NOTE — Telephone Encounter (Signed)
Pt's wife called and wanted you to know her had a chest CT scan and she wanted to know if you could take a look at it,.

## 2018-10-19 NOTE — Telephone Encounter (Signed)
Pt wife aware of transmission

## 2018-10-19 NOTE — Telephone Encounter (Signed)
S/w wife advised her of JG recommendations

## 2018-10-19 NOTE — Telephone Encounter (Signed)
-----   Message from [name], MD sent at 10/18/2018  6:49 AM EDT ----- Regarding: Loop recorder No A. Fib. No heart block

## 2018-10-22 DIAGNOSIS — I4891 Unspecified atrial fibrillation: Secondary | ICD-10-CM

## 2018-10-22 DIAGNOSIS — Z4509 Encounter for adjustment and management of other cardiac device: Secondary | ICD-10-CM

## 2018-10-22 DIAGNOSIS — Z95818 Presence of other cardiac implants and grafts: Secondary | ICD-10-CM

## 2018-11-02 ENCOUNTER — Encounter: Payer: Self-pay | Admitting: Cardiology

## 2018-11-02 ENCOUNTER — Ambulatory Visit (INDEPENDENT_AMBULATORY_CARE_PROVIDER_SITE_OTHER): Payer: Medicare Other | Admitting: Cardiology

## 2018-11-02 ENCOUNTER — Other Ambulatory Visit: Payer: Self-pay

## 2018-11-02 VITALS — BP 128/70 | HR 68 | Ht 71.0 in | Wt 171.5 lb

## 2018-11-02 DIAGNOSIS — R5383 Other fatigue: Secondary | ICD-10-CM

## 2018-11-02 DIAGNOSIS — R5381 Other malaise: Secondary | ICD-10-CM | POA: Diagnosis not present

## 2018-11-02 DIAGNOSIS — Z8673 Personal history of transient ischemic attack (TIA), and cerebral infarction without residual deficits: Secondary | ICD-10-CM

## 2018-11-02 DIAGNOSIS — Z95818 Presence of other cardiac implants and grafts: Secondary | ICD-10-CM

## 2018-11-02 DIAGNOSIS — E7800 Pure hypercholesterolemia, unspecified: Secondary | ICD-10-CM

## 2018-11-02 DIAGNOSIS — I2510 Atherosclerotic heart disease of native coronary artery without angina pectoris: Secondary | ICD-10-CM

## 2018-11-02 DIAGNOSIS — I749 Embolism and thrombosis of unspecified artery: Secondary | ICD-10-CM

## 2018-11-02 NOTE — Progress Notes (Signed)
Primary Physician/Referring:  [name], MD  Patient ID: Alan Rubio, male    DOB: [birth year], 85 y.o.   MRN: [id number]  Chief Complaint  Patient presents with  . Hypertension  . Shortness of Breath  . Follow-up    HPI: Alan Rubio  is a 85 y.o. Caucasian male  with controlled type 2 diabetes mellitus, hypertension, hypothyroidism, and melena for 2-3 weeks which resolved just about a week before he presented with stroke on 04/13/2018 with multiple bilateral infarcts including left thalamic and scattered right frontal and small hemorrhagic conversion and right MCA/ACA and left MCA/PCA with embolic etiology secondary to unknown source.  CTA head and neck showed multifocal intracranial stenosis. Negative echocardiogram.  He is presently doing well except for fatigue.  He does have gait instability and also decreased memory.  Is accompanied by his wife.  Referred back to me for evaluation of coronary cusp is noted on the CT scan.  He has had 2 episodes of UTI recently. He has had significant resolution of symptoms of visual disturbances and also dysarthria since stroke and states that his back to his baseline.  He underwent loop recorder implantation on 06/16/2018. He is now referred back to me for evaluation of severe coronary calcificaiton noted on CT scan performed 09/27/18.   Past Medical History:  Diagnosis Date  . A-fib ([place])   . Cancer ([place])   . Diabetes mellitus without complication ([place])   . Encounter for loop recorder check 09/15/2018  . Hyperlipemia 06/13/2018  . Hypertension   . Hypothyroidism 06/13/2018  . 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 Stroke ([place])   . Thyroid disease     Past Surgical History:  Procedure Laterality Date  . BACK SURGERY    . LOOP RECORDER IMPLANT      Social History   Socioeconomic History  . Marital status: Married    Spouse name: Not on file   . Number of children: 1  . Years of education: Not on file  . Highest education level: Not on file  Occupational History  . Not on file  Social Needs  . Financial resource strain: Not on file  . Food insecurity    Worry: Not on file    Inability: Not on file  . Transportation needs    Medical: Not on file    Non-medical: Not on file  Tobacco Use  . Smoking status: Never Smoker  . Smokeless tobacco: Never Used  Substance and Sexual Activity  . Alcohol use: Not Currently  . Drug use: Never  . Sexual activity: Not on file  Lifestyle  . Physical activity    Days per week: Not on file    Minutes per session: Not on file  . Stress: Not on file  Relationships  . Social Herbalist on phone: Not on file    Gets together: Not on file    Attends religious service: Not on file    Active member of club or organization: Not on file    Attends meetings of clubs or organizations: Not on file    Relationship status: Not on file  . Intimate partner violence    Fear of current or ex partner: Not on file    Emotionally abused: Not on file    Physically abused: Not on file    Forced sexual activity: Not on file  Other  Topics Concern  . Not on file  Social History Narrative  . Not on file    Current Outpatient Medications on File Prior to Visit  Medication Sig Dispense Refill  . allopurinol (ZYLOPRIM) 300 MG tablet Take 300 mg by mouth daily.    Marland Kitchen aspirin EC 81 MG tablet Take 81 mg by mouth daily.    Marland Kitchen atorvastatin (LIPITOR) 40 MG tablet Take 1 tablet (40 mg total) by mouth daily at 6 PM. 30 tablet 0  . doxazosin (CARDURA) 8 MG tablet Take 8 mg by mouth daily. 1/2 QD    . levothyroxine (SYNTHROID, LEVOTHROID) 75 MCG tablet Take 75 mcg by mouth daily before breakfast.    . losartan (COZAAR) 25 MG tablet Take 25 mg by mouth daily.    . pioglitazone (ACTOS) 15 MG tablet Take 15 mg by mouth daily.    Vladimir Faster Glycol-Propyl Glycol (SYSTANE) 0.4-0.3 % SOLN Place 1 drop into  both eyes 3 (three) times daily as needed (dry eyes).     No current facility-administered medications on file prior to visit.     Review of Systems  Constitution: Positive for decreased appetite and malaise/fatigue. Negative for weight gain and weight loss.  Eyes: Positive for blurred vision (blurred occasional in right). Negative for visual disturbance.  Cardiovascular: Negative for chest pain, claudication, dyspnea on exertion, leg swelling, orthopnea, palpitations and syncope.  Respiratory: Negative for hemoptysis and wheezing.   Endocrine: Negative for cold intolerance and heat intolerance.  Hematologic/Lymphatic: Does not bruise/bleed easily.  Skin: Negative for nail changes.  Musculoskeletal: Positive for back pain. Negative for falls, muscle weakness and myalgias.  Gastrointestinal: Negative for abdominal pain, change in bowel habit, nausea and vomiting.  Neurological: Positive for disturbances in coordination. Negative for difficulty with concentration, dizziness, focal weakness and headaches.  Psychiatric/Behavioral: Positive for memory loss. Negative for altered mental status and suicidal ideas.       Difficulty expressing words  All other systems reviewed and are negative.  Objective  Blood pressure 128/70, pulse 68, height 5\' 11"  (1.803 m), weight 171 lb 8 oz (77.8 kg), SpO2 99 %. Body mass index is 23.92 kg/m.    Physical Exam  Constitutional: He is oriented to person, place, and time. Vital signs are normal. He appears well-developed and well-nourished.  HENT:  Head: Normocephalic and atraumatic.  Neck: Normal range of motion.  Cardiovascular: Normal rate, regular rhythm, normal heart sounds, intact distal pulses and normal pulses.  Thick, rigid nails bilateral Acrocyanosis bilateral Venous stasis pigementation  Pulmonary/Chest: Effort normal and breath sounds normal. No accessory muscle usage. No respiratory distress.  Abdominal: Soft. Bowel sounds are normal. He  exhibits abdominal bruit.  Musculoskeletal: Normal range of motion.  Neurological: He is alert and oriented to person, place, and time.  Skin: Skin is warm and dry.  Vitals reviewed.  Left parasternal region loop recorder implantation site has healed well.  Radiology: MRI brain 04/14/2018:  1. 11 mm acute ischemic nonhemorrhagic left thalamic infarct, with slight inferior extension into the left cerebral peduncle/midbrain. 2. Few additional scattered acute to subacute infarcts involving the right frontal and parietal lobes as well as the left occipital lobe as above. There is associated petechial hemorrhage about the subacute right frontal infarct, which corresponds with patchy enhancement seen on prior CTA. 3. Underlying atrophy with moderate chronic microvascular ischemic Disease.  CTA of head 04/14/2018:  1. Negative CTA for large vessel occlusion.  2. Multifocal moderate to severe left P1 and P2 stenoses without  vascular occlusion. 3. Severe atheromatous stenosis at the origin of the right vertebral artery, with additional moderate to severe bilateral V4 stenoses as above, right worse than left. Atheromatous stenoses about the carotid bifurcations with up to 50% narrowing on the left and mild 25% narrowing on the right. 4. Moderate atherosclerotic change throughout the carotid siphons with associated moderate diffuse narrowing. 5. Small focus of patchy enhancement involving the anterior right frontal cortex as above, indeterminate, but could reflect the sequelae of subacute ischemia. Correlation with brain MRI recommended.  Laboratory examination:    CMP Latest Ref Rng & Units 04/17/2018 04/15/2018 04/13/2018  Glucose 70 - 99 mg/dL 153(H) 156(H) 170(H)  BUN 8 - 23 mg/dL 24(H) 13 23  Creatinine 0.61 - 1.24 mg/dL 1.18 0.81 0.90  Sodium 135 - 145 mmol/L 137 137 137  Potassium 3.5 - 5.1 mmol/L 4.3 3.7 3.9  Chloride 98 - 111 mmol/L 102 104 107  CO2 22 - 32 mmol/L 25 23 22   Calcium 8.9 -  10.3 mg/dL 9.6 9.7 8.9  Total Protein 6.5 - 8.1 g/dL 6.6 - 6.5  Total Bilirubin 0.3 - 1.2 mg/dL 0.8 - 0.6  Alkaline Phos 38 - 126 U/L 44 - 43  AST 15 - 41 U/L 49(H) - 29  ALT 0 - 44 U/L 34 - 22   CBC Latest Ref Rng & Units 04/17/2018 04/15/2018 04/13/2018  WBC 4.0 - 10.5 K/uL 7.1 7.1 6.5  Hemoglobin 13.0 - 17.0 g/dL 12.7(L) 13.4 12.2(L)  Hematocrit 39.0 - 52.0 % 39.2 39.8 36.4(L)  Platelets 150 - 400 K/uL 204 191 182   Lipid Panel     Component Value Date/Time   CHOL 143 04/14/2018 0955   TRIG 98 04/14/2018 0955   HDL 39 (L) 04/14/2018 0955   CHOLHDL 3.7 04/14/2018 0955   VLDL 20 04/14/2018 0955   LDLCALC 84 04/14/2018 0955   HEMOGLOBIN A1C Lab Results  Component Value Date   HGBA1C 7.9 (H) 04/14/2018   MPG 180.03 04/14/2018   TSH No results for input(s): TSH in the last 8760 hours.  Cardiac Studies:   Transthoracic Echocardiogram 04/14/2018 IMPRESSIONS 1. The left ventricle has normal systolic function with an ejection fraction of 60-65%.The cavity size was normal. Left ventricular diastolic Doppler parameters are consistent with impaired relaxation. 2. The right ventricle has normal systolic function. The cavity was normal. There is no increase in right ventricular wall thickness. 3. Moderate sclerosis of the aortic valve. no stenosis of the aortic valve. 4. The inferior vena cava was normal in size with <50% respiratory variability. 5. No definite intracardiac source of emboli. 6. Suboptimal image quality, however no evidence of right to left shunt with agitated saline injection in subcostal view. Negative contrast in RA does not exclude possible left to right shunt. No evidence of interatrial shunt by color flow Doppler.  High-resolution CT scan of the chest 10/16/2020: Asbestos-related pleural disease.  Nonspecific lung nodules. Aortic atherosclerosis and left main and three-vessel coronary artery calcification.  Aortic valve calcification. Echocardiographic  correlation for evaluation of potential valvular dysfunction may be warranted if clinically indicated.  Assessment     ICD-10-CM   1. Coronary artery calcification seen on CT scan  I25.10   2. Pure hypercholesterolemia  E78.00   3. History of stroke without residual deficits  Z86.73 Pulse oximetry, overnight  4. 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 Malaise and fatigue  R53.81 Pulse oximetry, overnight   R53.83  EKG 06/13/2018: Normal sinus rhythm at 75 bpm with 1 PAC, normal axis, no evidence of ischemia.   Remote loop recorder check  9.15.20: 1 false AF=SB/1st deg AVB/PACs  Recommendations:   Patient with history of cryptogenic stroke, hypertension, diabetes mellitus which is well controlled, hyperlipidemia and hypertension referred back to me for evaluation of coronary atherosclerosis noted on CT scan of the chest.  His loop recorder does not reveal any evidence of atrial fibrillation.  Indeed he does have significant three-vessel coronary calcification and left main disease.  However he also has severe cerebrovascular disease as well intracranial he.  Hence I'm not surprised to find significant coronary atherosclerosis but essentially remains asymptomatic without angina pectoris or clinical evidence of congestive heart failure.  He is also on best medical therapy with  excellent control of hypertension and also he is on a statin therapy for hyperlipidemia.    At this point I have recommended aggressive medical therapy and also would consider evaluation of nocturnal hypoxemia is in etiology for his chronic fatigue and also in view of coronary atherosclerosis and cerebrovascular disease, he may benefit from oxygen supplementation.  I'll order this test.  I'll perform this test and unless he has new symptoms, I'll see him back in 6 months.  I'll also obtain previously performed lipid profile testing, his goal LDL is less than 70 mg/dL.  Patient's wife is  present at the bedside.  All questions answered.  [name], MD, [facility] 11/02/2018, 1:27 PM [facility] [place] Pager: [phone] Office: [phone] If no answer Cell [phone]

## 2018-11-03 ENCOUNTER — Encounter: Payer: Self-pay | Admitting: Cardiology

## 2018-11-19 DIAGNOSIS — Z4509 Encounter for adjustment and management of other cardiac device: Secondary | ICD-10-CM

## 2018-11-19 DIAGNOSIS — Z95818 Presence of other cardiac implants and grafts: Secondary | ICD-10-CM

## 2018-11-19 DIAGNOSIS — I4891 Unspecified atrial fibrillation: Secondary | ICD-10-CM

## 2018-11-23 ENCOUNTER — Telehealth: Payer: Self-pay

## 2018-11-23 NOTE — Telephone Encounter (Signed)
-----   Message from [name], MD sent at 11/12/2018 10:27 AM EDT ----- Regarding: Loop Normal rhythm. No A. Fib or heart block

## 2018-11-23 NOTE — Telephone Encounter (Signed)
Pt aware.

## 2018-11-28 DIAGNOSIS — R5383 Other fatigue: Secondary | ICD-10-CM | POA: Diagnosis not present

## 2018-12-01 DIAGNOSIS — Z8546 Personal history of malignant neoplasm of prostate: Secondary | ICD-10-CM | POA: Diagnosis not present

## 2018-12-01 DIAGNOSIS — E039 Hypothyroidism, unspecified: Secondary | ICD-10-CM | POA: Diagnosis not present

## 2018-12-01 DIAGNOSIS — E785 Hyperlipidemia, unspecified: Secondary | ICD-10-CM | POA: Diagnosis not present

## 2018-12-01 DIAGNOSIS — E1142 Type 2 diabetes mellitus with diabetic polyneuropathy: Secondary | ICD-10-CM | POA: Diagnosis not present

## 2018-12-01 DIAGNOSIS — M109 Gout, unspecified: Secondary | ICD-10-CM | POA: Diagnosis not present

## 2018-12-01 DIAGNOSIS — I6330 Cerebral infarction due to thrombosis of unspecified cerebral artery: Secondary | ICD-10-CM | POA: Diagnosis not present

## 2018-12-02 NOTE — Progress Notes (Signed)
Need to send for O2 orders, if patient willing

## 2018-12-04 ENCOUNTER — Telehealth: Payer: Self-pay | Admitting: Cardiology

## 2018-12-04 DIAGNOSIS — R5381 Other malaise: Secondary | ICD-10-CM

## 2018-12-04 DIAGNOSIS — R5383 Other fatigue: Secondary | ICD-10-CM

## 2018-12-04 DIAGNOSIS — G4734 Idiopathic sleep related nonobstructive alveolar hypoventilation: Secondary | ICD-10-CM

## 2018-12-04 DIAGNOSIS — R0609 Other forms of dyspnea: Secondary | ICD-10-CM

## 2018-12-04 NOTE — Telephone Encounter (Signed)
What diagnosis did we use

## 2018-12-06 NOTE — Telephone Encounter (Signed)
You put in [facility] of Stroke, [place] and fatigue

## 2018-12-06 NOTE — Telephone Encounter (Signed)
Please change to dyspnea and shortness of breath and nocturnal hypoxemia. I have created an encounter and orders

## 2018-12-06 NOTE — Telephone Encounter (Signed)
Nocturnal oximetry 11/24/2018: SpO2 less than 88% 10 minutes, less than 89% 14 minutes, lowest SpO2 80%.  Oxygen desaturation events 150, index 20.  Patient qualifies for nocturnal oxygen supplementation per Medicare guidelines group 1.  Patient will benefit from O2 supplementation. Orders placed.  JG

## 2018-12-12 ENCOUNTER — Telehealth: Payer: Self-pay

## 2018-12-12 NOTE — Telephone Encounter (Signed)
-----   Message from [name], MD sent at 12/07/2018 10:44 PM EST ----- Regarding: Loop Normal sinus rhythm. No atrial fib

## 2018-12-12 NOTE — Telephone Encounter (Signed)
Pt aware.

## 2018-12-22 ENCOUNTER — Other Ambulatory Visit: Payer: Self-pay | Admitting: Cardiology

## 2018-12-22 DIAGNOSIS — I10 Essential (primary) hypertension: Secondary | ICD-10-CM

## 2018-12-25 NOTE — Telephone Encounter (Signed)
Can you please tell me if pt should be on metoprolol or not?

## 2018-12-25 NOTE — Telephone Encounter (Signed)
From the last few notes, I do not see this medication on chart and please ask patient. Otherwise no to refills

## 2018-12-26 NOTE — Telephone Encounter (Signed)
Tried calling no VM and could not leave message

## 2018-12-29 ENCOUNTER — Ambulatory Visit: Payer: Medicare Other | Admitting: Cardiology

## 2019-01-16 ENCOUNTER — Telehealth: Payer: Self-pay

## 2019-01-16 NOTE — Telephone Encounter (Signed)
LVM with details.

## 2019-01-16 NOTE — Telephone Encounter (Signed)
-----   Message from [name], MD sent at 01/12/2019  2:31 PM EST ----- Regarding: Loop Remote loop recorder check 01/02/2019:  Uneventful  monitoring. Continue remote monitoring.

## 2019-01-20 DIAGNOSIS — Z4509 Encounter for adjustment and management of other cardiac device: Secondary | ICD-10-CM | POA: Diagnosis not present

## 2019-01-20 DIAGNOSIS — Z95818 Presence of other cardiac implants and grafts: Secondary | ICD-10-CM | POA: Diagnosis not present

## 2019-01-20 DIAGNOSIS — I6340 Cerebral infarction due to embolism of unspecified cerebral artery: Secondary | ICD-10-CM | POA: Diagnosis not present

## 2019-02-20 DIAGNOSIS — I6340 Cerebral infarction due to embolism of unspecified cerebral artery: Secondary | ICD-10-CM | POA: Diagnosis not present

## 2019-02-20 DIAGNOSIS — Z95818 Presence of other cardiac implants and grafts: Secondary | ICD-10-CM | POA: Diagnosis not present

## 2019-02-20 DIAGNOSIS — Z4509 Encounter for adjustment and management of other cardiac device: Secondary | ICD-10-CM | POA: Diagnosis not present

## 2019-02-21 DIAGNOSIS — E1165 Type 2 diabetes mellitus with hyperglycemia: Secondary | ICD-10-CM | POA: Diagnosis not present

## 2019-02-21 DIAGNOSIS — Z5181 Encounter for therapeutic drug level monitoring: Secondary | ICD-10-CM | POA: Diagnosis not present

## 2019-02-21 DIAGNOSIS — E1142 Type 2 diabetes mellitus with diabetic polyneuropathy: Secondary | ICD-10-CM | POA: Diagnosis not present

## 2019-02-21 DIAGNOSIS — I10 Essential (primary) hypertension: Secondary | ICD-10-CM | POA: Diagnosis not present

## 2019-02-21 DIAGNOSIS — E785 Hyperlipidemia, unspecified: Secondary | ICD-10-CM | POA: Diagnosis not present

## 2019-02-21 DIAGNOSIS — E039 Hypothyroidism, unspecified: Secondary | ICD-10-CM | POA: Diagnosis not present

## 2019-02-21 DIAGNOSIS — Z7189 Other specified counseling: Secondary | ICD-10-CM | POA: Diagnosis not present

## 2019-03-02 ENCOUNTER — Telehealth: Payer: Self-pay

## 2019-03-02 NOTE — Telephone Encounter (Signed)
-----   Message from [name], MD sent at 03/01/2019 11:05 PM EST ----- Regarding: Loop Scheduled Remote loop recorder check 02/27/2019: Uneventful  Monitoring. No A. Fib. NSR. Continue remote monitoring.  No atrial fibrillation or any other irregular heart beats. COntinue monitoring. Encourage him to sign up for my chart  Alan Rubio

## 2019-03-02 NOTE — Telephone Encounter (Signed)
Spoke with patient wife regarding loop recorder check results.

## 2019-03-18 DIAGNOSIS — Z95818 Presence of other cardiac implants and grafts: Secondary | ICD-10-CM | POA: Diagnosis not present

## 2019-03-18 DIAGNOSIS — Z4509 Encounter for adjustment and management of other cardiac device: Secondary | ICD-10-CM | POA: Diagnosis not present

## 2019-03-18 DIAGNOSIS — I6340 Cerebral infarction due to embolism of unspecified cerebral artery: Secondary | ICD-10-CM | POA: Diagnosis not present

## 2019-03-21 DIAGNOSIS — Z23 Encounter for immunization: Secondary | ICD-10-CM | POA: Diagnosis not present

## 2019-04-10 ENCOUNTER — Telehealth: Payer: Self-pay

## 2019-04-10 NOTE — Telephone Encounter (Signed)
Called patient, NA, LMAM

## 2019-04-10 NOTE — Telephone Encounter (Signed)
-----   Message from [name], MD sent at 04/07/2019  9:54 AM EST ----- Regarding: Loop Scheduled Remote loop recorder check 03/30/2019: Uneventful  Monitoring. No A. Fib. NSR. There was 1 pause episode detected since last transmission on 02/27/19. Review of ECG demonstrates an undersensed ventricular couplet. There were 0 brady episodes detected.Continue remote monitoring.  Normal rhythm, no abnormalities noted.   JG

## 2019-04-11 NOTE — Telephone Encounter (Signed)
Spoke with patient wife regarding his loop recorder check results.

## 2019-04-18 DIAGNOSIS — Z23 Encounter for immunization: Secondary | ICD-10-CM | POA: Diagnosis not present

## 2019-04-19 NOTE — Telephone Encounter (Signed)
06/16/18.Marland Kitchenreceived cancellation notification from [employer].. No Baseline.. monitor returned 06/17/18

## 2019-04-23 DIAGNOSIS — Z4509 Encounter for adjustment and management of other cardiac device: Secondary | ICD-10-CM | POA: Diagnosis not present

## 2019-04-23 DIAGNOSIS — I6340 Cerebral infarction due to embolism of unspecified cerebral artery: Secondary | ICD-10-CM | POA: Diagnosis not present

## 2019-04-23 DIAGNOSIS — Z95818 Presence of other cardiac implants and grafts: Secondary | ICD-10-CM | POA: Diagnosis not present

## 2019-05-01 NOTE — Progress Notes (Signed)
Primary Physician/Referring:  [name], MD  Patient ID: Alan Rubio, male    DOB: 11/12/1931, 86 y.o.   MRN: [id number]  Chief Complaint  Patient presents with  . Cerebrovascular Accident  . Hypertension  . Coronary Artery Disease  . Follow-up    6 month    HPI: Alan Rubio  is a 86 y.o. Caucasian male  with controlled type 2 diabetes mellitus, hypertension, hypothyroidism, stroke on 04/13/2018 with multiple bilateral infarcts including left thalamic and scattered right frontal and small hemorrhagic conversion and right MCA/ACA and left MCA/PCA with embolic etiology secondary to unknown source hence has a Biotronik loop recorder implanted on 06/16/2018.  CTA head and neck showed multifocal intracranial stenosis. He has loop recorder in situ (Biotronik).  CT angiogram of the chest had also revealed three-vessel coronary calcification, presently on aggressive medical therapy.  He is presently doing well, on his last office visit I had placed him on nocturnal oxygen due to severe nocturnal hypoxemia which is discontinued stating that his fatigue is improved and is back to himself.  Presently remains asymptomatic otherwise.  Past Medical History:  Diagnosis Date  . A-fib ([place])   . Cancer ([place])   . Diabetes mellitus without complication ([place])   . Encounter for loop recorder check 09/15/2018  . H/O: stroke 04/14/2018  . Hyperlipemia 06/13/2018  . Hypertension   . Hypothyroidism 06/13/2018  . 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 Stroke ([place])   . Thyroid disease     Past Surgical History:  Procedure Laterality Date  . BACK SURGERY    . LOOP RECORDER IMPLANT     Social History   Tobacco Use  . Smoking status: Never Smoker  . Smokeless tobacco: Never Used  Substance Use Topics  . Alcohol use: Yes    Comment: Once in a while   Marital status: Married   Current Outpatient  Medications on File Prior to Visit  Medication Sig Dispense Refill  . allopurinol (ZYLOPRIM) 300 MG tablet Take 300 mg by mouth daily.    Marland Kitchen aspirin EC 81 MG tablet Take 81 mg by mouth daily.    Marland Kitchen atorvastatin (LIPITOR) 40 MG tablet Take 1 tablet (40 mg total) by mouth daily at 6 PM. 30 tablet 0  . doxazosin (CARDURA) 8 MG tablet Take 8 mg by mouth daily. 1/2 QD    . levothyroxine (SYNTHROID, LEVOTHROID) 75 MCG tablet Take 75 mcg by mouth daily before breakfast.    . losartan (COZAAR) 25 MG tablet Take 25 mg by mouth daily.    . pioglitazone (ACTOS) 15 MG tablet Take 15 mg by mouth daily.    Vladimir Faster Glycol-Propyl Glycol (SYSTANE) 0.4-0.3 % SOLN Place 1 drop into both eyes 3 (three) times daily as needed (dry eyes).     No current facility-administered medications on file prior to visit.   Review of Systems  Constitution: Positive for malaise/fatigue.  Eyes: Positive for blurred vision.  Cardiovascular: Negative for chest pain, dyspnea on exertion and leg swelling.  Musculoskeletal: Positive for back pain.  Gastrointestinal: Negative for melena.   Objective  Blood pressure 127/72, pulse 69, temperature (!) 96.5 F (35.8 C), temperature source Oral, resp. rate 16, height 5\' 11"  (1.803 m), weight 172 lb (78 kg), SpO2 96 %. Body mass index is 23.99 kg/m.    Physical Exam  Constitutional: Vital signs are normal. He appears well-developed and  well-nourished.  Cardiovascular: Normal rate, regular rhythm, normal heart sounds, intact distal pulses and normal pulses.  Thick, rigid nails bilateral Acrocyanosis bilateral Venous stasis pigementation  Pulmonary/Chest: Effort normal and breath sounds normal. No accessory muscle usage. No respiratory distress.  Abdominal: Soft. Bowel sounds are normal. He exhibits abdominal bruit.  Vitals reviewed.  Laboratory examination:    CMP Latest Ref Rng & Units 04/17/2018 04/15/2018 04/13/2018  Glucose 70 - 99 mg/dL 153(H) 156(H) 170(H)  BUN 8 - 23  mg/dL 24(H) 13 23  Creatinine 0.61 - 1.24 mg/dL 1.18 0.81 0.90  Sodium 135 - 145 mmol/L 137 137 137  Potassium 3.5 - 5.1 mmol/L 4.3 3.7 3.9  Chloride 98 - 111 mmol/L 102 104 107  CO2 22 - 32 mmol/L 25 23 22   Calcium 8.9 - 10.3 mg/dL 9.6 9.7 8.9  Total Protein 6.5 - 8.1 g/dL 6.6 - 6.5  Total Bilirubin 0.3 - 1.2 mg/dL 0.8 - 0.6  Alkaline Phos 38 - 126 U/L 44 - 43  AST 15 - 41 U/L 49(H) - 29  ALT 0 - 44 U/L 34 - 22   CBC Latest Ref Rng & Units 04/17/2018 04/15/2018 04/13/2018  WBC 4.0 - 10.5 K/uL 7.1 7.1 6.5  Hemoglobin 13.0 - 17.0 g/dL 12.7(L) 13.4 12.2(L)  Hematocrit 39.0 - 52.0 % 39.2 39.8 36.4(L)  Platelets 150 - 400 K/uL 204 191 182   Lipid Panel     Component Value Date/Time   CHOL 143 04/14/2018 0955   TRIG 98 04/14/2018 0955   HDL 39 (L) 04/14/2018 0955   CHOLHDL 3.7 04/14/2018 0955   VLDL 20 04/14/2018 0955   LDLCALC 84 04/14/2018 0955   HEMOGLOBIN A1C Lab Results  Component Value Date   HGBA1C 7.9 (H) 04/14/2018   MPG 180.03 04/14/2018   TSH No results for input(s): TSH in the last 8760 hours.   External labs:  A1C 7.100 02/21/2019  Radiology   MRI brain 04/14/2018:  1. 11 mm acute ischemic nonhemorrhagic left thalamic infarct, with slight inferior extension into the left cerebral peduncle/midbrain. 2. Few additional scattered acute to subacute infarcts involving the right frontal and parietal lobes as well as the left occipital lobe as above. There is associated petechial hemorrhage about the subacute right frontal infarct, which corresponds with patchy enhancement seen on prior CTA. 3. Underlying atrophy with moderate chronic microvascular ischemic Disease.  CTA of head 04/14/2018:  1. Negative CTA for large vessel occlusion.  2. Multifocal moderate to severe left P1 and P2 stenoses without vascular occlusion. 3. Severe atheromatous stenosis at the origin of the right vertebral artery, with additional moderate to severe bilateral V4 stenoses as above, right  worse than left. Atheromatous stenoses about the carotid bifurcations with up to 50% narrowing on the left and mild 25% narrowing on the right. 4. Moderate atherosclerotic change throughout the carotid siphons with associated moderate diffuse narrowing. 5. Small focus of patchy enhancement involving the anterior right frontal cortex as above, indeterminate, but could reflect the sequelae of subacute ischemia. Correlation with brain MRI recommended.  Cardiac Studies:   Transthoracic Echocardiogram3/20/2020 1. The left ventricle has normal systolic function with an ejection fraction of 60-65%.The cavity size was normal. Left ventricular diastolic Doppler parameters are consistent with impaired relaxation. 2. The right ventricle has normal systolic function. The cavity was normal. There is no increase in right ventricular wall thickness. 3. Moderate sclerosis of the aortic valve. no stenosis of the aortic valve. 4. The inferior vena cava was normal in  size with <50% respiratory variability. 5. No definite intracardiac source of emboli. 6. Suboptimal image quality, however no evidence of right to left shunt with agitated saline injection in subcostal view. Negative contrast in RA does not exclude possible left to right shunt. No evidence of interatrial shunt by color flow Doppler.  Nocturnal oximetry 11/24/2018:  SpO2 less than 88% 10 minutes, less than 89% 14 minutes, lowest SpO2 80%.  Oxygen desaturation events 150, index 20.  Patient qualifies for nocturnal oxygen supplementation per Medicare guidelines group 1. Patient will benefit from O2 supplementation  High-resolution CT scan of the chest 10/17/2018: Asbestos-related pleural disease.  Nonspecific lung nodules. Aortic atherosclerosis and left main and three-vessel coronary artery calcification.  Aortic valve calcification. Echocardiographic correlation for evaluation of potential valvular dysfunction may be warranted if clinically  indicated.  Scheduled Remote loop recorder check 03/30/2019: Uneventful  Monitoring. No A. Fib. NSR. There was 1 pause episode detected since last transmission on 02/27/19. Review of ECG demonstrates an undersensed ventricular couplet. There were 0 brady episodes detected.Continue remote monitoring.  EKG:   EKG 05/03/2019: Normal sinus rhythm with rate of 63 bpm, normal axis, poor R wave progression, probably normal variant but cannot exclude anteroseptal infarct old.  Low-voltage complexes.  No evidence of ischemia, normal QT interval. No significant change from  EKG 06/13/2018   Assessment     ICD-10-CM   1. Coronary artery calcification seen on CT scan  I25.10 EKG 12-Lead  2. Pure hypercholesterolemia  E78.00   3. Cryptogenic stroke (HCC)  I63.9   4. Loop recorder Biotronik in situ for cryptogenic stroke  Z95.818      Recommendations:    Alan Rubio  is a 86 y.o. Caucasian male  with controlled type 2 diabetes mellitus, hypertension, hypothyroidism, stroke on 04/13/2018 with multiple bilateral infarcts including left thalamic and scattered right frontal and small hemorrhagic conversion and right MCA/ACA and left MCA/PCA with embolic etiology secondary to unknown source hence has a Biotronik loop recorder implanted on 06/16/2018.  CTA head and neck showed multifocal intracranial stenosis. He has loop recorder in situ (Biotronik).  CT angiogram of the chest had also revealed three-vessel coronary calcification, presently on aggressive medical therapy.  He remains asymptomatic without angina pectoris or clinical evidence of congestive heart failure.  He is also on best medical therapy with  excellent control of hypertension and also he is on a statin therapy for hyperlipidemia.    He was also noted to have severe nocturnal hypoxemia for which he was on supplemental oxygen, but returned it and states he has no further fatigue and is back to baseline.  I reviewed his labs, potassium is normal, renal  function is normal, A1c much improved although slightly elevated than 7.0, but overall stable.  I will see him back in 1 year. Continue remote monitoring of Loop recorder for A. Fib.   [name], MD, [facility] 05/05/2019, 6:01 PM [facility]. [place] Office: [phone]

## 2019-05-03 ENCOUNTER — Ambulatory Visit: Payer: Medicare Other | Admitting: Cardiology

## 2019-05-03 ENCOUNTER — Other Ambulatory Visit: Payer: Self-pay

## 2019-05-03 ENCOUNTER — Encounter: Payer: Self-pay | Admitting: Cardiology

## 2019-05-03 VITALS — BP 127/72 | HR 69 | Temp 96.5°F | Resp 16 | Ht 71.0 in | Wt 172.0 lb

## 2019-05-03 DIAGNOSIS — I2510 Atherosclerotic heart disease of native coronary artery without angina pectoris: Secondary | ICD-10-CM | POA: Diagnosis not present

## 2019-05-03 DIAGNOSIS — Z95818 Presence of other cardiac implants and grafts: Secondary | ICD-10-CM

## 2019-05-03 DIAGNOSIS — I639 Cerebral infarction, unspecified: Secondary | ICD-10-CM

## 2019-05-03 DIAGNOSIS — E7800 Pure hypercholesterolemia, unspecified: Secondary | ICD-10-CM

## 2019-05-06 ENCOUNTER — Telehealth: Payer: Self-pay | Admitting: Cardiology

## 2019-05-06 NOTE — Telephone Encounter (Signed)
Scheduled Remote loop recorder check 05/01/2019: Uneventful  Monitoring. No A. Fib. NSR. Continue remote monitoring.

## 2019-05-09 NOTE — Telephone Encounter (Signed)
Called patient, no answer, LMAM.

## 2019-05-10 NOTE — Telephone Encounter (Signed)
Patient called back, I have discussed remote loop recorder results with her.

## 2019-05-24 DIAGNOSIS — I6340 Cerebral infarction due to embolism of unspecified cerebral artery: Secondary | ICD-10-CM | POA: Diagnosis not present

## 2019-05-24 DIAGNOSIS — Z95818 Presence of other cardiac implants and grafts: Secondary | ICD-10-CM | POA: Diagnosis not present

## 2019-05-24 DIAGNOSIS — Z4509 Encounter for adjustment and management of other cardiac device: Secondary | ICD-10-CM | POA: Diagnosis not present

## 2019-05-29 DIAGNOSIS — Z8546 Personal history of malignant neoplasm of prostate: Secondary | ICD-10-CM | POA: Diagnosis not present

## 2019-05-29 DIAGNOSIS — E785 Hyperlipidemia, unspecified: Secondary | ICD-10-CM | POA: Diagnosis not present

## 2019-05-29 DIAGNOSIS — M109 Gout, unspecified: Secondary | ICD-10-CM | POA: Diagnosis not present

## 2019-05-29 DIAGNOSIS — I10 Essential (primary) hypertension: Secondary | ICD-10-CM | POA: Diagnosis not present

## 2019-05-29 DIAGNOSIS — E1142 Type 2 diabetes mellitus with diabetic polyneuropathy: Secondary | ICD-10-CM | POA: Diagnosis not present

## 2019-05-29 DIAGNOSIS — E039 Hypothyroidism, unspecified: Secondary | ICD-10-CM | POA: Diagnosis not present

## 2019-05-29 DIAGNOSIS — B351 Tinea unguium: Secondary | ICD-10-CM | POA: Diagnosis not present

## 2019-06-10 ENCOUNTER — Telehealth: Payer: Self-pay | Admitting: Cardiology

## 2019-06-11 NOTE — Telephone Encounter (Signed)
Called patient, left results on VM.

## 2019-07-10 ENCOUNTER — Telehealth: Payer: Self-pay | Admitting: Cardiology

## 2019-07-11 NOTE — Telephone Encounter (Signed)
Called and spoke with patient regarding his loop recorder check results.

## 2019-08-11 ENCOUNTER — Telehealth: Payer: Self-pay | Admitting: Cardiology

## 2019-08-14 NOTE — Telephone Encounter (Signed)
Called and spoke with patient regarding his pacemaker check results.

## 2019-08-22 DIAGNOSIS — I10 Essential (primary) hypertension: Secondary | ICD-10-CM | POA: Diagnosis not present

## 2019-08-22 DIAGNOSIS — Z7984 Long term (current) use of oral hypoglycemic drugs: Secondary | ICD-10-CM | POA: Diagnosis not present

## 2019-08-22 DIAGNOSIS — E1142 Type 2 diabetes mellitus with diabetic polyneuropathy: Secondary | ICD-10-CM | POA: Diagnosis not present

## 2019-08-22 DIAGNOSIS — H6693 Otitis media, unspecified, bilateral: Secondary | ICD-10-CM | POA: Diagnosis not present

## 2019-08-22 DIAGNOSIS — E1165 Type 2 diabetes mellitus with hyperglycemia: Secondary | ICD-10-CM | POA: Diagnosis not present

## 2019-08-22 DIAGNOSIS — E785 Hyperlipidemia, unspecified: Secondary | ICD-10-CM | POA: Diagnosis not present

## 2019-08-22 DIAGNOSIS — E039 Hypothyroidism, unspecified: Secondary | ICD-10-CM | POA: Diagnosis not present

## 2019-08-23 DIAGNOSIS — Z95818 Presence of other cardiac implants and grafts: Secondary | ICD-10-CM | POA: Diagnosis not present

## 2019-08-23 DIAGNOSIS — I6340 Cerebral infarction due to embolism of unspecified cerebral artery: Secondary | ICD-10-CM | POA: Diagnosis not present

## 2019-08-23 DIAGNOSIS — Z4509 Encounter for adjustment and management of other cardiac device: Secondary | ICD-10-CM | POA: Diagnosis not present

## 2019-09-10 ENCOUNTER — Telehealth: Payer: Self-pay | Admitting: Cardiology

## 2019-09-11 NOTE — Telephone Encounter (Signed)
Called patient, NA, LMAM to callback for loop recorder results.

## 2019-09-12 NOTE — Telephone Encounter (Signed)
Left vm to for pt.to call back with results.

## 2019-09-23 DIAGNOSIS — I6340 Cerebral infarction due to embolism of unspecified cerebral artery: Secondary | ICD-10-CM | POA: Diagnosis not present

## 2019-09-23 DIAGNOSIS — Z4509 Encounter for adjustment and management of other cardiac device: Secondary | ICD-10-CM | POA: Diagnosis not present

## 2019-09-23 DIAGNOSIS — Z95818 Presence of other cardiac implants and grafts: Secondary | ICD-10-CM | POA: Diagnosis not present

## 2019-10-16 ENCOUNTER — Telehealth: Payer: Self-pay | Admitting: Cardiology

## 2019-10-17 NOTE — Telephone Encounter (Signed)
Called and spoke with patient regarding her loop recorder results.

## 2019-10-24 DIAGNOSIS — Z95818 Presence of other cardiac implants and grafts: Secondary | ICD-10-CM | POA: Diagnosis not present

## 2019-10-24 DIAGNOSIS — Z4509 Encounter for adjustment and management of other cardiac device: Secondary | ICD-10-CM | POA: Diagnosis not present

## 2019-10-24 DIAGNOSIS — I6340 Cerebral infarction due to embolism of unspecified cerebral artery: Secondary | ICD-10-CM | POA: Diagnosis not present

## 2019-11-06 DIAGNOSIS — E039 Hypothyroidism, unspecified: Secondary | ICD-10-CM | POA: Diagnosis not present

## 2019-11-06 DIAGNOSIS — E1142 Type 2 diabetes mellitus with diabetic polyneuropathy: Secondary | ICD-10-CM | POA: Diagnosis not present

## 2019-11-06 DIAGNOSIS — E1165 Type 2 diabetes mellitus with hyperglycemia: Secondary | ICD-10-CM | POA: Diagnosis not present

## 2019-11-06 DIAGNOSIS — R5383 Other fatigue: Secondary | ICD-10-CM | POA: Diagnosis not present

## 2019-11-06 DIAGNOSIS — Z7984 Long term (current) use of oral hypoglycemic drugs: Secondary | ICD-10-CM | POA: Diagnosis not present

## 2019-11-16 ENCOUNTER — Other Ambulatory Visit: Payer: Self-pay

## 2019-11-16 ENCOUNTER — Ambulatory Visit (INDEPENDENT_AMBULATORY_CARE_PROVIDER_SITE_OTHER): Payer: Medicare Other | Admitting: Podiatry

## 2019-11-16 DIAGNOSIS — B351 Tinea unguium: Secondary | ICD-10-CM

## 2019-11-16 DIAGNOSIS — M79674 Pain in right toe(s): Secondary | ICD-10-CM

## 2019-11-16 DIAGNOSIS — M79675 Pain in left toe(s): Secondary | ICD-10-CM | POA: Diagnosis not present

## 2019-11-16 DIAGNOSIS — E1159 Type 2 diabetes mellitus with other circulatory complications: Secondary | ICD-10-CM | POA: Diagnosis not present

## 2019-11-20 ENCOUNTER — Encounter: Payer: Self-pay | Admitting: Podiatry

## 2019-11-20 DIAGNOSIS — Z23 Encounter for immunization: Secondary | ICD-10-CM | POA: Diagnosis not present

## 2019-11-20 NOTE — Progress Notes (Signed)
  Subjective:  Patient ID: Alan Rubio, male    DOB: 1931-09-29,  MRN: [id number]  Chief Complaint  Patient presents with  . routine foot care    PT is a type 2 diabetic. BS 131. Here for a routine foot care nail trim    86 y.o. male returns for the above complaint.  Patient presents with thickened elongated dystrophic toenails x10.  Patient is a type II diabetic with last A1c of 7.9.  Patient states is working on glucose control.  He denies any other acute complaints he would like to have his nails debrided down as he is not able to do it himself.  He has mild pain to them.  Objective:  There were no vitals filed for this visit. Podiatric Exam: Vascular: dorsalis pedis and posterior tibial pulses are palpable bilateral. Capillary return is immediate. Temperature gradient is WNL. Skin turgor WNL  Sensorium: Normal Semmes Weinstein monofilament test. Normal tactile sensation bilaterally. Nail Exam: Pt has thick disfigured discolored nails with subungual debris noted bilateral entire nail hallux through fifth toenails.  Pain on palpation to the nails. Ulcer Exam: There is no evidence of ulcer or pre-ulcerative changes or infection. Orthopedic Exam: Muscle tone and strength are WNL. No limitations in general ROM. No crepitus or effusions noted. HAV  B/L.  Hammer toes 2-5  B/L. Skin: No Porokeratosis. No infection or ulcers    Assessment & Plan:  No diagnosis found.  Patient was evaluated and treated and all questions answered.  Onychomycosis with pain  -Nails palliatively debrided as below. -Educated on self-care  Procedure: Nail Debridement Rationale: pain  Type of Debridement: manual, sharp debridement. Instrumentation: Nail nipper, rotary burr. Number of Nails: 10  Procedures and Treatment: Consent by patient was obtained for treatment procedures. The patient understood the discussion of treatment and procedures well. All questions were answered thoroughly reviewed. Debridement of  mycotic and hypertrophic toenails, 1 through 5 bilateral and clearing of subungual debris. No ulceration, no infection noted.  Return Visit-Office Procedure: Patient instructed to return to the office for a follow up visit 3 months for continued evaluation and treatment.  Boneta Lucks, DPM    No follow-ups on file.

## 2019-11-27 DIAGNOSIS — R5383 Other fatigue: Secondary | ICD-10-CM | POA: Diagnosis not present

## 2019-12-06 DIAGNOSIS — Z95818 Presence of other cardiac implants and grafts: Secondary | ICD-10-CM | POA: Diagnosis not present

## 2019-12-06 DIAGNOSIS — I6340 Cerebral infarction due to embolism of unspecified cerebral artery: Secondary | ICD-10-CM | POA: Diagnosis not present

## 2019-12-06 DIAGNOSIS — Z4509 Encounter for adjustment and management of other cardiac device: Secondary | ICD-10-CM | POA: Diagnosis not present

## 2019-12-26 DIAGNOSIS — I10 Essential (primary) hypertension: Secondary | ICD-10-CM | POA: Diagnosis not present

## 2019-12-26 DIAGNOSIS — Z7984 Long term (current) use of oral hypoglycemic drugs: Secondary | ICD-10-CM | POA: Diagnosis not present

## 2019-12-26 DIAGNOSIS — E785 Hyperlipidemia, unspecified: Secondary | ICD-10-CM | POA: Diagnosis not present

## 2019-12-26 DIAGNOSIS — E039 Hypothyroidism, unspecified: Secondary | ICD-10-CM | POA: Diagnosis not present

## 2019-12-26 DIAGNOSIS — E1142 Type 2 diabetes mellitus with diabetic polyneuropathy: Secondary | ICD-10-CM | POA: Diagnosis not present

## 2020-01-06 DIAGNOSIS — I6340 Cerebral infarction due to embolism of unspecified cerebral artery: Secondary | ICD-10-CM | POA: Diagnosis not present

## 2020-01-06 DIAGNOSIS — Z95818 Presence of other cardiac implants and grafts: Secondary | ICD-10-CM | POA: Diagnosis not present

## 2020-01-06 DIAGNOSIS — Z4509 Encounter for adjustment and management of other cardiac device: Secondary | ICD-10-CM | POA: Diagnosis not present

## 2020-01-24 DIAGNOSIS — E785 Hyperlipidemia, unspecified: Secondary | ICD-10-CM | POA: Diagnosis not present

## 2020-01-24 DIAGNOSIS — Z8546 Personal history of malignant neoplasm of prostate: Secondary | ICD-10-CM | POA: Diagnosis not present

## 2020-01-24 DIAGNOSIS — I6330 Cerebral infarction due to thrombosis of unspecified cerebral artery: Secondary | ICD-10-CM | POA: Diagnosis not present

## 2020-01-24 DIAGNOSIS — E039 Hypothyroidism, unspecified: Secondary | ICD-10-CM | POA: Diagnosis not present

## 2020-01-24 DIAGNOSIS — E1142 Type 2 diabetes mellitus with diabetic polyneuropathy: Secondary | ICD-10-CM | POA: Diagnosis not present

## 2020-01-24 DIAGNOSIS — E119 Type 2 diabetes mellitus without complications: Secondary | ICD-10-CM | POA: Diagnosis not present

## 2020-01-24 DIAGNOSIS — I10 Essential (primary) hypertension: Secondary | ICD-10-CM | POA: Diagnosis not present

## 2020-01-28 DIAGNOSIS — Z79899 Other long term (current) drug therapy: Secondary | ICD-10-CM | POA: Diagnosis not present

## 2020-01-28 DIAGNOSIS — R5383 Other fatigue: Secondary | ICD-10-CM | POA: Diagnosis not present

## 2020-02-06 DIAGNOSIS — I6340 Cerebral infarction due to embolism of unspecified cerebral artery: Secondary | ICD-10-CM | POA: Diagnosis not present

## 2020-02-06 DIAGNOSIS — Z4509 Encounter for adjustment and management of other cardiac device: Secondary | ICD-10-CM | POA: Diagnosis not present

## 2020-02-06 DIAGNOSIS — Z95818 Presence of other cardiac implants and grafts: Secondary | ICD-10-CM | POA: Diagnosis not present

## 2020-02-20 ENCOUNTER — Ambulatory Visit: Payer: Medicare Other | Admitting: Podiatry

## 2020-02-20 ENCOUNTER — Telehealth: Payer: Self-pay | Admitting: Cardiology

## 2020-02-20 NOTE — Telephone Encounter (Signed)
Spoke to patient he is aware

## 2020-02-25 DIAGNOSIS — E039 Hypothyroidism, unspecified: Secondary | ICD-10-CM | POA: Diagnosis not present

## 2020-02-25 DIAGNOSIS — Z8546 Personal history of malignant neoplasm of prostate: Secondary | ICD-10-CM | POA: Diagnosis not present

## 2020-02-25 DIAGNOSIS — I6330 Cerebral infarction due to thrombosis of unspecified cerebral artery: Secondary | ICD-10-CM | POA: Diagnosis not present

## 2020-02-25 DIAGNOSIS — I10 Essential (primary) hypertension: Secondary | ICD-10-CM | POA: Diagnosis not present

## 2020-02-25 DIAGNOSIS — E1142 Type 2 diabetes mellitus with diabetic polyneuropathy: Secondary | ICD-10-CM | POA: Diagnosis not present

## 2020-02-25 DIAGNOSIS — E785 Hyperlipidemia, unspecified: Secondary | ICD-10-CM | POA: Diagnosis not present

## 2020-02-25 DIAGNOSIS — E119 Type 2 diabetes mellitus without complications: Secondary | ICD-10-CM | POA: Diagnosis not present

## 2020-03-10 DIAGNOSIS — Z4509 Encounter for adjustment and management of other cardiac device: Secondary | ICD-10-CM | POA: Diagnosis not present

## 2020-03-10 DIAGNOSIS — I6340 Cerebral infarction due to embolism of unspecified cerebral artery: Secondary | ICD-10-CM | POA: Diagnosis not present

## 2020-03-10 DIAGNOSIS — Z95818 Presence of other cardiac implants and grafts: Secondary | ICD-10-CM | POA: Diagnosis not present

## 2020-03-18 DIAGNOSIS — E039 Hypothyroidism, unspecified: Secondary | ICD-10-CM | POA: Diagnosis not present

## 2020-03-18 DIAGNOSIS — I10 Essential (primary) hypertension: Secondary | ICD-10-CM | POA: Diagnosis not present

## 2020-03-18 DIAGNOSIS — E1142 Type 2 diabetes mellitus with diabetic polyneuropathy: Secondary | ICD-10-CM | POA: Diagnosis not present

## 2020-03-18 DIAGNOSIS — I6330 Cerebral infarction due to thrombosis of unspecified cerebral artery: Secondary | ICD-10-CM | POA: Diagnosis not present

## 2020-03-18 DIAGNOSIS — Z8546 Personal history of malignant neoplasm of prostate: Secondary | ICD-10-CM | POA: Diagnosis not present

## 2020-03-18 DIAGNOSIS — E785 Hyperlipidemia, unspecified: Secondary | ICD-10-CM | POA: Diagnosis not present

## 2020-03-18 DIAGNOSIS — E119 Type 2 diabetes mellitus without complications: Secondary | ICD-10-CM | POA: Diagnosis not present

## 2020-04-12 DIAGNOSIS — E039 Hypothyroidism, unspecified: Secondary | ICD-10-CM | POA: Diagnosis not present

## 2020-04-12 DIAGNOSIS — E1142 Type 2 diabetes mellitus with diabetic polyneuropathy: Secondary | ICD-10-CM | POA: Diagnosis not present

## 2020-04-12 DIAGNOSIS — I6330 Cerebral infarction due to thrombosis of unspecified cerebral artery: Secondary | ICD-10-CM | POA: Diagnosis not present

## 2020-04-12 DIAGNOSIS — E119 Type 2 diabetes mellitus without complications: Secondary | ICD-10-CM | POA: Diagnosis not present

## 2020-04-12 DIAGNOSIS — E785 Hyperlipidemia, unspecified: Secondary | ICD-10-CM | POA: Diagnosis not present

## 2020-04-12 DIAGNOSIS — I10 Essential (primary) hypertension: Secondary | ICD-10-CM | POA: Diagnosis not present

## 2020-04-12 DIAGNOSIS — Z8546 Personal history of malignant neoplasm of prostate: Secondary | ICD-10-CM | POA: Diagnosis not present

## 2020-04-22 DIAGNOSIS — R0989 Other specified symptoms and signs involving the circulatory and respiratory systems: Secondary | ICD-10-CM | POA: Diagnosis not present

## 2020-04-22 DIAGNOSIS — Z7984 Long term (current) use of oral hypoglycemic drugs: Secondary | ICD-10-CM | POA: Diagnosis not present

## 2020-04-22 DIAGNOSIS — B353 Tinea pedis: Secondary | ICD-10-CM | POA: Diagnosis not present

## 2020-04-22 DIAGNOSIS — E785 Hyperlipidemia, unspecified: Secondary | ICD-10-CM | POA: Diagnosis not present

## 2020-04-22 DIAGNOSIS — R6889 Other general symptoms and signs: Secondary | ICD-10-CM | POA: Diagnosis not present

## 2020-04-22 DIAGNOSIS — E039 Hypothyroidism, unspecified: Secondary | ICD-10-CM | POA: Diagnosis not present

## 2020-04-22 DIAGNOSIS — Z79899 Other long term (current) drug therapy: Secondary | ICD-10-CM | POA: Diagnosis not present

## 2020-04-22 DIAGNOSIS — E119 Type 2 diabetes mellitus without complications: Secondary | ICD-10-CM | POA: Diagnosis not present

## 2020-04-30 DIAGNOSIS — R0989 Other specified symptoms and signs involving the circulatory and respiratory systems: Secondary | ICD-10-CM | POA: Diagnosis not present

## 2020-04-30 DIAGNOSIS — I70203 Unspecified atherosclerosis of native arteries of extremities, bilateral legs: Secondary | ICD-10-CM | POA: Diagnosis not present

## 2020-05-02 ENCOUNTER — Encounter: Payer: Self-pay | Admitting: Student

## 2020-05-02 ENCOUNTER — Ambulatory Visit: Payer: Medicare Other | Admitting: Cardiology

## 2020-05-02 ENCOUNTER — Other Ambulatory Visit: Payer: Self-pay

## 2020-05-02 ENCOUNTER — Ambulatory Visit: Payer: Medicare Other | Admitting: Student

## 2020-05-02 VITALS — BP 126/70 | HR 68 | Temp 98.3°F | Resp 17 | Ht 71.0 in | Wt 173.2 lb

## 2020-05-02 DIAGNOSIS — E7800 Pure hypercholesterolemia, unspecified: Secondary | ICD-10-CM | POA: Diagnosis not present

## 2020-05-02 DIAGNOSIS — Z95818 Presence of other cardiac implants and grafts: Secondary | ICD-10-CM

## 2020-05-02 DIAGNOSIS — I2510 Atherosclerotic heart disease of native coronary artery without angina pectoris: Secondary | ICD-10-CM

## 2020-05-02 DIAGNOSIS — I639 Cerebral infarction, unspecified: Secondary | ICD-10-CM | POA: Diagnosis not present

## 2020-05-02 NOTE — Progress Notes (Signed)
Primary Physician/Referring:  [name], MD  Patient ID: Alan Rubio, male    DOB: 11-11-1931, 87 y.o.   MRN: [id number]  Chief Complaint  Patient presents with  . Hypertension    1 year  . stroke    HPI: [name]  is a 87 y.o. Caucasian male  with controlled type 2 diabetes mellitus, hypertension, hypothyroidism, stroke on 04/13/2018 with multiple bilateral infarcts including left thalamic and scattered right frontal and small hemorrhagic conversion and right MCA/ACA and left MCA/PCA with embolic etiology secondary to unknown source hence has a Biotronik loop recorder implanted on 06/16/2018.  CTA head and neck showed multifocal intracranial stenosis. He has loop recorder in situ (Biotronik).  CT angiogram of the chest had also revealed three-vessel coronary calcification, presently on aggressive medical therapy.  Patient presents for annual follow up. Patient's loop recorder transmission have been without evidence of cardiac arrhythmias. He continues to do well, without specific complaints today. He is accompanied by his son. Patient reports he had several weeks of fatigue which was previously attributed to statin therapy, however, he was recently seen by endocrinologist who made adjustments to his Synthroid and patient's symptoms of fatigue have since resolved. Patients PCP recently started him on atorvastatin 10 mg daily for cardiovascular risk reduction. Patient denies chest pain, palpitations, syncope, near-syncope, dyspnea.   Past Medical History:  Diagnosis Date  . A-fib ([place])   . Cancer ([place])   . Diabetes mellitus without complication ([place])   . Encounter for loop recorder check 09/15/2018  . H/O: stroke 04/14/2018  . Hyperlipemia 06/13/2018  . Hypertension   . Hypothyroidism 06/13/2018  . 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 Stroke ([place])   . Thyroid disease    Family History   Problem Relation Age of Onset  . Stroke Mother   . Aneurysm Father   . Cancer Brother    Past Surgical History:  Procedure Laterality Date  . BACK SURGERY    . LOOP RECORDER IMPLANT     Social History   Tobacco Use  . Smoking status: Never Smoker  . Smokeless tobacco: Never Used  Substance Use Topics  . Alcohol use: Yes    Comment: Once in a while   Marital status: Married   Medications and Allergies    Allergies  Allergen Reactions  . Penicillins Rash    Did it involve swelling of the face/tongue/throat, SOB, or low BP? Yes Did it involve sudden or severe rash/hives, skin peeling, or any reaction on the inside of your mouth or nose? No Did you need to seek medical attention at a hospital or doctor's office? No When did it last happen? If all above answers are "NO", may proceed with cephalosporin use.      Current Outpatient Medications on File Prior to Visit  Medication Sig Dispense Refill  . ACCU-CHEK AVIVA PLUS test strip daily.    Marland Kitchen allopurinol (ZYLOPRIM) 300 MG tablet Take 300 mg by mouth daily.    Marland Kitchen aspirin EC 81 MG tablet Take 81 mg by mouth daily.    Marland Kitchen atorvastatin (LIPITOR) 10 MG tablet Take 10 mg by mouth daily.    Marland Kitchen doxazosin (CARDURA) 8 MG tablet Take 8 mg by mouth daily. 1/2 QD    . ezetimibe (ZETIA) 10 MG tablet Take 10 mg by mouth daily.    Marland Kitchen JARDIANCE 10 MG TABS tablet Take 10 mg  by mouth daily.    Marland Kitchen levothyroxine (SYNTHROID, LEVOTHROID) 75 MCG tablet Take 75 mcg by mouth daily before breakfast.    . losartan (COZAAR) 25 MG tablet Take 25 mg by mouth daily.    . pioglitazone (ACTOS) 15 MG tablet Take 15 mg by mouth daily.    Vladimir Faster Glycol-Propyl Glycol 0.4-0.3 % SOLN Place 1 drop into both eyes 3 (three) times daily as needed (dry eyes).     No current facility-administered medications on file prior to visit.   ROS   Review of Systems  Constitutional: Positive for malaise/fatigue. Negative for weight gain.  Eyes: Negative for blurred  vision.  Cardiovascular: Negative for chest pain, claudication, dyspnea on exertion, leg swelling, near-syncope, orthopnea, palpitations, paroxysmal nocturnal dyspnea and syncope.  Respiratory: Negative for shortness of breath.   Hematologic/Lymphatic: Does not bruise/bleed easily.  Musculoskeletal: Positive for back pain.  Gastrointestinal: Negative for melena.  Neurological: Negative for dizziness and weakness.   Objective  Blood pressure 126/70, pulse 68, temperature 98.3 F (36.8 C), temperature source Temporal, resp. rate 17, height 5\' 11"  (1.803 m), weight 173 lb 3.2 oz (78.6 kg), SpO2 97 %. Body mass index is 24.16 kg/m.    Physical Exam Vitals reviewed.  Constitutional:      Appearance: He is well-developed.  HENT:     Head: Normocephalic and atraumatic.  Cardiovascular:     Rate and Rhythm: Normal rate and regular rhythm.     Pulses: Normal pulses and intact distal pulses.     Heart sounds: Normal heart sounds, S1 normal and S2 normal. No murmur heard. No gallop.      Comments: Thick, rigid nails bilateral Acrocyanosis bilateral Venous stasis pigementation Pulmonary:     Effort: Pulmonary effort is normal. No accessory muscle usage or respiratory distress.     Breath sounds: Normal breath sounds. No wheezing, rhonchi or rales.  Abdominal:     General: Bowel sounds are normal. There is abdominal bruit.     Palpations: Abdomen is soft.  Musculoskeletal:     Right lower leg: No edema.     Left lower leg: No edema.  Neurological:     Mental Status: He is alert.    Laboratory examination:    CMP Latest Ref Rng & Units 04/17/2018 04/15/2018 04/13/2018  Glucose 70 - 99 mg/dL 153(H) 156(H) 170(H)  BUN 8 - 23 mg/dL 24(H) 13 23  Creatinine 0.61 - 1.24 mg/dL 1.18 0.81 0.90  Sodium 135 - 145 mmol/L 137 137 137  Potassium 3.5 - 5.1 mmol/L 4.3 3.7 3.9  Chloride 98 - 111 mmol/L 102 104 107  CO2 22 - 32 mmol/L 25 23 22   Calcium 8.9 - 10.3 mg/dL 9.6 9.7 8.9  Total Protein 6.5  - 8.1 g/dL 6.6 - 6.5  Total Bilirubin 0.3 - 1.2 mg/dL 0.8 - 0.6  Alkaline Phos 38 - 126 U/L 44 - 43  AST 15 - 41 U/L 49(H) - 29  ALT 0 - 44 U/L 34 - 22   CBC Latest Ref Rng & Units 04/17/2018 04/15/2018 04/13/2018  WBC 4.0 - 10.5 K/uL 7.1 7.1 6.5  Hemoglobin 13.0 - 17.0 g/dL 12.7(L) 13.4 12.2(L)  Hematocrit 39.0 - 52.0 % 39.2 39.8 36.4(L)  Platelets 150 - 400 K/uL 204 191 182   Lipid Panel     Component Value Date/Time   CHOL 143 04/14/2018 0955   TRIG 98 04/14/2018 0955   HDL 39 (L) 04/14/2018 0955   CHOLHDL 3.7 04/14/2018 0955  VLDL 20 04/14/2018 0955   LDLCALC 84 04/14/2018 0955   HEMOGLOBIN A1C Lab Results  Component Value Date   HGBA1C 7.9 (H) 04/14/2018   MPG 180.03 04/14/2018   TSH No results for input(s): TSH in the last 8760 hours.   External labs:  A1C 7.100 02/21/2019  Radiology   MRI brain 04/14/2018:  1. 11 mm acute ischemic nonhemorrhagic left thalamic infarct, with slight inferior extension into the left cerebral peduncle/midbrain. 2. Few additional scattered acute to subacute infarcts involving the right frontal and parietal lobes as well as the left occipital lobe as above. There is associated petechial hemorrhage about the subacute right frontal infarct, which corresponds with patchy enhancement seen on prior CTA. 3. Underlying atrophy with moderate chronic microvascular ischemic Disease.  CTA of head 04/14/2018:  1. Negative CTA for large vessel occlusion.  2. Multifocal moderate to severe left P1 and P2 stenoses without vascular occlusion. 3. Severe atheromatous stenosis at the origin of the right vertebral artery, with additional moderate to severe bilateral V4 stenoses as above, right worse than left. Atheromatous stenoses about the carotid bifurcations with up to 50% narrowing on the left and mild 25% narrowing on the right. 4. Moderate atherosclerotic change throughout the carotid siphons with associated moderate diffuse narrowing. 5. Small focus of  patchy enhancement involving the anterior right frontal cortex as above, indeterminate, but could reflect the sequelae of subacute ischemia. Correlation with brain MRI recommended.  Cardiac Studies:   Transthoracic Echocardiogram3/20/2020 1. The left ventricle has normal systolic function with an ejection fraction of 60-65%.The cavity size was normal. Left ventricular diastolic Doppler parameters are consistent with impaired relaxation. 2. The right ventricle has normal systolic function. The cavity was normal. There is no increase in right ventricular wall thickness. 3. Moderate sclerosis of the aortic valve. no stenosis of the aortic valve. 4. The inferior vena cava was normal in size with <50% respiratory variability. 5. No definite intracardiac source of emboli. 6. Suboptimal image quality, however no evidence of right to left shunt with agitated saline injection in subcostal view. Negative contrast in RA does not exclude possible left to right shunt. No evidence of interatrial shunt by color flow Doppler.  Nocturnal oximetry 11/24/2018:  SpO2 less than 88% 10 minutes, less than 89% 14 minutes, lowest SpO2 80%.  Oxygen desaturation events 150, index 20.  Patient qualifies for nocturnal oxygen supplementation per Medicare guidelines group 1. Patient will benefit from O2 supplementation  High-resolution CT scan of the chest 10/17/2018: Asbestos-related pleural disease.  Nonspecific lung nodules. Aortic atherosclerosis and left main and three-vessel coronary artery calcification.  Aortic valve calcification. Echocardiographic correlation for evaluation of potential valvular dysfunction may be warranted if clinically indicated.  Scheduled Remote loop recorder check 03/30/2019: Uneventful  Monitoring. No A. Fib. NSR. There was 1 pause episode detected since last transmission on 02/27/19. Review of ECG demonstrates an undersensed ventricular couplet. There were 0 brady episodes  detected.Continue remote monitoring.  EKG:   EKG 05/02/2020: Sinus rhythm at a rate of 67 bpm.  Left axis, left anterior fascicular block.  Poor progression, cannot exclude anteroseptal infarct old.  Low voltage complexes, consider pulmonary disease pattern.  Nonspecific T wave abnormality.  Compared to EKG 05/03/2019, LAFB new.   EKG 05/03/2019: Normal sinus rhythm with rate of 63 bpm, normal axis, poor R wave progression, probably normal variant but cannot exclude anteroseptal infarct old.  Low-voltage complexes.  No evidence of ischemia, normal QT interval. No significant change from  EKG 06/13/2018  Assessment     ICD-10-CM   1. Coronary artery calcification seen on CT scan  I25.10 EKG 12-Lead  2. Pure hypercholesterolemia  E78.00   3. Cryptogenic stroke (HCC)  I63.9   4. Loop recorder Biotronik in situ for cryptogenic stroke  Z95.818      No orders of the defined types were placed in this encounter.  Medications Discontinued During This Encounter  Medication Reason  . atorvastatin (LIPITOR) 40 MG tablet Error    Recommendations:    Stevenson Mcnulty  is a 87 y.o. Caucasian male  with controlled type 2 diabetes mellitus, hypertension, hypothyroidism, stroke on 04/13/2018 with multiple bilateral infarcts including left thalamic and scattered right frontal and small hemorrhagic conversion and right MCA/ACA and left MCA/PCA with embolic etiology secondary to unknown source hence has a Biotronik loop recorder implanted on 06/16/2018.  CTA head and neck showed multifocal intracranial stenosis. He has loop recorder in situ (Biotronik).  CT angiogram of the chest had also revealed three-vessel coronary calcification, presently on aggressive medical therapy.  Patient presents for annual follow up. Patient's loop recorder transmission have been without evidence of cardiac arrhythmias. Patient remains asymptomatic from of cardiac standpoint. Blood pressure is well controlled. No recent lipid panel for  review, will defer further management to PCP. Advised patient to continue atorvastatin and notify our office if he has side effects. However I suspect his fatigue was due to thyroid dysfunction rather than statin therapy. Will not make changes to his medications at this time. Will continue to monitor loop recorder transmissions.   Follow up in 1 year, sooner if needed, for CAD, hypertension, and hyperlipidemia.    Alethia Berthold, PA-C 05/03/2020, 1:37 PM Office: [phone]

## 2020-05-18 DIAGNOSIS — Z4509 Encounter for adjustment and management of other cardiac device: Secondary | ICD-10-CM | POA: Diagnosis not present

## 2020-05-18 DIAGNOSIS — I6340 Cerebral infarction due to embolism of unspecified cerebral artery: Secondary | ICD-10-CM | POA: Diagnosis not present

## 2020-05-18 DIAGNOSIS — Z95818 Presence of other cardiac implants and grafts: Secondary | ICD-10-CM | POA: Diagnosis not present

## 2020-05-19 DIAGNOSIS — Z8673 Personal history of transient ischemic attack (TIA), and cerebral infarction without residual deficits: Secondary | ICD-10-CM | POA: Diagnosis not present

## 2020-05-19 DIAGNOSIS — E039 Hypothyroidism, unspecified: Secondary | ICD-10-CM | POA: Diagnosis not present

## 2020-05-19 DIAGNOSIS — E119 Type 2 diabetes mellitus without complications: Secondary | ICD-10-CM | POA: Diagnosis not present

## 2020-05-19 DIAGNOSIS — R0989 Other specified symptoms and signs involving the circulatory and respiratory systems: Secondary | ICD-10-CM | POA: Diagnosis not present

## 2020-05-23 DIAGNOSIS — E119 Type 2 diabetes mellitus without complications: Secondary | ICD-10-CM | POA: Diagnosis not present

## 2020-05-23 DIAGNOSIS — I6330 Cerebral infarction due to thrombosis of unspecified cerebral artery: Secondary | ICD-10-CM | POA: Diagnosis not present

## 2020-05-23 DIAGNOSIS — E039 Hypothyroidism, unspecified: Secondary | ICD-10-CM | POA: Diagnosis not present

## 2020-05-23 DIAGNOSIS — E785 Hyperlipidemia, unspecified: Secondary | ICD-10-CM | POA: Diagnosis not present

## 2020-05-23 DIAGNOSIS — Z8546 Personal history of malignant neoplasm of prostate: Secondary | ICD-10-CM | POA: Diagnosis not present

## 2020-05-23 DIAGNOSIS — I10 Essential (primary) hypertension: Secondary | ICD-10-CM | POA: Diagnosis not present

## 2020-05-23 DIAGNOSIS — E1142 Type 2 diabetes mellitus with diabetic polyneuropathy: Secondary | ICD-10-CM | POA: Diagnosis not present

## 2020-06-03 DIAGNOSIS — Z79899 Other long term (current) drug therapy: Secondary | ICD-10-CM | POA: Diagnosis not present

## 2020-06-03 DIAGNOSIS — E039 Hypothyroidism, unspecified: Secondary | ICD-10-CM | POA: Diagnosis not present

## 2020-06-03 DIAGNOSIS — E785 Hyperlipidemia, unspecified: Secondary | ICD-10-CM | POA: Diagnosis not present

## 2020-06-09 DIAGNOSIS — E785 Hyperlipidemia, unspecified: Secondary | ICD-10-CM | POA: Diagnosis not present

## 2020-06-09 DIAGNOSIS — I6330 Cerebral infarction due to thrombosis of unspecified cerebral artery: Secondary | ICD-10-CM | POA: Diagnosis not present

## 2020-06-09 DIAGNOSIS — E1142 Type 2 diabetes mellitus with diabetic polyneuropathy: Secondary | ICD-10-CM | POA: Diagnosis not present

## 2020-06-09 DIAGNOSIS — Z8546 Personal history of malignant neoplasm of prostate: Secondary | ICD-10-CM | POA: Diagnosis not present

## 2020-06-09 DIAGNOSIS — E119 Type 2 diabetes mellitus without complications: Secondary | ICD-10-CM | POA: Diagnosis not present

## 2020-06-09 DIAGNOSIS — E039 Hypothyroidism, unspecified: Secondary | ICD-10-CM | POA: Diagnosis not present

## 2020-06-09 DIAGNOSIS — I10 Essential (primary) hypertension: Secondary | ICD-10-CM | POA: Diagnosis not present

## 2020-06-12 IMAGING — CT CT ANGIOGRAPHY NECK
1 of 8 series · 6 of 33 positions shown · IV contrast (omnipaque)
Comparison: Prior head CT from 04/13/2018

CLINICAL DATA: Initial evaluation for blurry vision, recent falls.



[Series 7: ax thin · axial · 0.39mm/px · z∈[-260,-20]mm · 6 of 338 slices shown]
[im 49/338  soft-tissue]
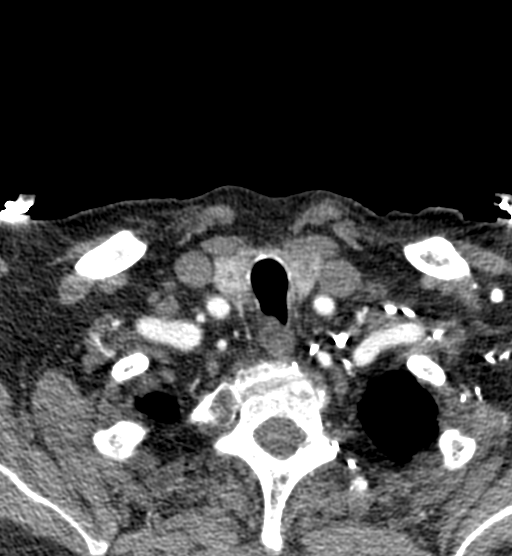
[im 97/338  bone]
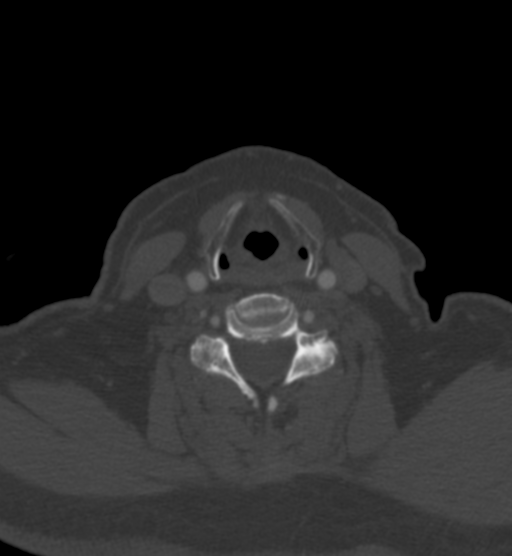
[im 145/338  soft-tissue]
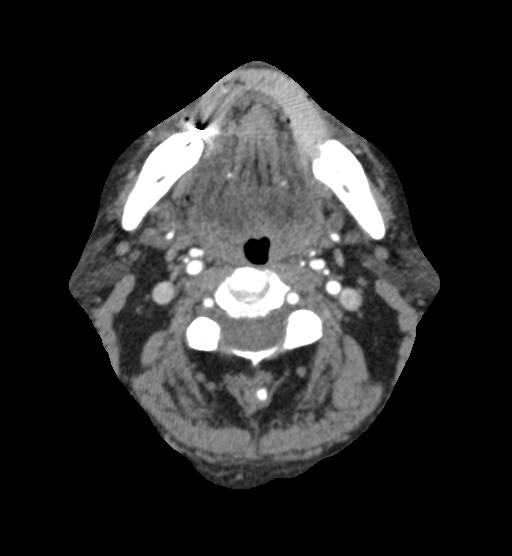
[im 193/338  bone]
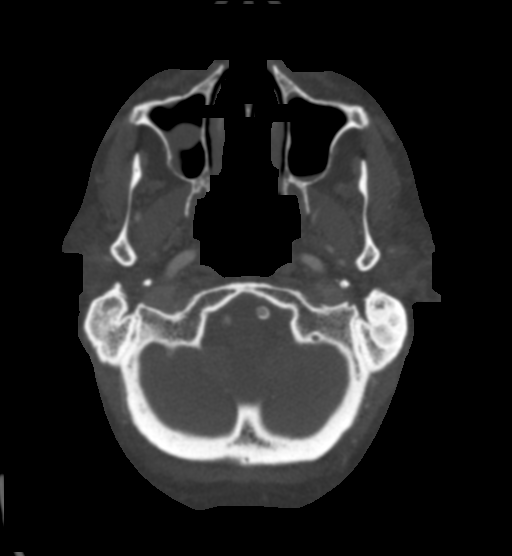
[im 241/338  soft-tissue]
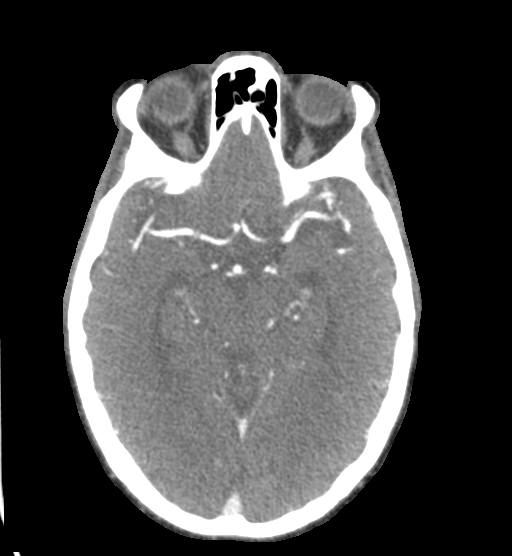
[im 289/338  bone]
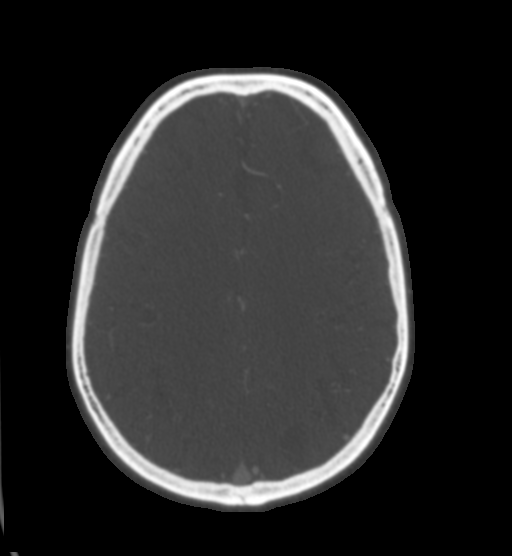

[6 of 33 positions shown; findings below may reference images not displayed]

FINDINGS: CTA NECK FINDINGS

Aortic arch: Partially visualized aortic arch of normal caliber with
normal branch pattern. Moderate atherosclerotic change about the
aortic arch and origin of the great vessels. No definite
flow-limiting stenosis, although origin of the great vessels
incompletely visualized. Visualized subclavian arteries widely
patent.

Right carotid system: Right common carotid artery patent from its
origin to the bifurcation. Concentric calcified plaque about the
right bifurcation/proximal right ICA with relatively mild 25
narrowing by NASCET criteria. Right ICA widely patent distally to
the skull base without stenosis or other vascular abnormality.

Left carotid system: Visualized left common carotid artery patent
from its origin to the bifurcation without stenosis. Scattered
calcified plaque about the left bifurcation/proximal left ICA with
associated stenosis of up to 50% by NASCET criteria. Left ICA mildly
tortuous but otherwise widely patent to the skull base without
stenosis or other vascular abnormality.

Vertebral arteries: Both of the vertebral arteries arise from the
subclavian arteries. Mixed plaque at the origin of the right
vertebral artery with associated severe stenosis (series 7, image
299). Vertebral arteries otherwise patent within the neck without
stenosis, dissection, or occlusion. Left vertebral artery dominant.

Skeleton: No acute osseous finding. Moderate multilevel cervical
spondylolysis. No discrete osseous lesions. Patient is edentulous.

Other neck: Thyroid heterogeneous with multifocal predominantly
subcentimeter nodules, of doubtful significance. No other acute soft
tissue abnormality within the neck.

Upper chest: 7 mm mildly spiculated nodule at the right lung apex
(series 5, image 19).

Review of the MIP images confirms the above findings

CTA HEAD FINDINGS

Anterior circulation: Petrous segments widely patent. Diffuse
atheromatous plaque throughout the cavernous/supraclinoid ICAs with
resultant mild to moderate diffuse narrowing (no more than 50%). ICA
termini well perfused. A1 segments patent bilaterally. Normal
anterior communicating artery. Anterior cerebral arteries widely
patent to their distal aspects. M1 segments widely patent. Distal
MCA branches well perfused and symmetric.

Posterior circulation: Both vertebral arteries widely patent as it
crosses into the cranial vault. Left vertebral artery dominant.
Atheromatous plaque with resultant moderate multifocal stenoses seen
involving the dominant left V4 segment. On the right, scattered
atheromatous plaque within the distal right V4 segment results in
moderate to severe multifocal segmental stenoses (series 7, image
138). Posterior inferior cerebral arteries patent bilaterally.
Basilar widely patent to its distal aspect. Superior cerebral
arteries patent bilaterally. Both of the posterior cerebral arteries
primarily supplied via the basilar. Right PCA widely patent to its
distal aspect. Focal severe left P1 stenosis (series 8, image 103).
Additional moderate to severe tandem stenoses seen involving the
mid-distal left P2 segment. Left PCA is perfused to its distal
aspect.

Venous sinuses: Patent.

Anatomic variants: None significant.

Delayed phase: Small focus of patchy enhancement seen involving the
cortical gray matter and underlying subcortical white matter at the
anterior right frontal lobe (series 13, image 25, 24),
indeterminate. No definite discrete mass within this region.

Review of the MIP images confirms the above findings
IMPRESSION: 1. Negative CTA for large vessel occlusion.
2. Multifocal moderate to severe left P1 and P2 stenoses without
vascular occlusion.
3. Severe atheromatous stenosis at the origin of the right vertebral
artery, with additional moderate to severe bilateral V4 stenoses as
above, right worse than left. Atheromatous stenoses about the
carotid bifurcations with up to 50% narrowing on the left and mild
25% narrowing on the right.
4. Moderate atherosclerotic change throughout the carotid siphons
with associated moderate diffuse narrowing.
5. Small focus of patchy enhancement involving the anterior right
frontal cortex as above, indeterminate, but could reflect the
sequelae of subacute ischemia. Correlation with brain MRI
recommended.

## 2020-06-18 DIAGNOSIS — Z4509 Encounter for adjustment and management of other cardiac device: Secondary | ICD-10-CM | POA: Diagnosis not present

## 2020-06-18 DIAGNOSIS — Z95818 Presence of other cardiac implants and grafts: Secondary | ICD-10-CM | POA: Diagnosis not present

## 2020-06-18 DIAGNOSIS — I6340 Cerebral infarction due to embolism of unspecified cerebral artery: Secondary | ICD-10-CM | POA: Diagnosis not present

## 2020-07-07 DIAGNOSIS — E1142 Type 2 diabetes mellitus with diabetic polyneuropathy: Secondary | ICD-10-CM | POA: Diagnosis not present

## 2020-07-07 DIAGNOSIS — E785 Hyperlipidemia, unspecified: Secondary | ICD-10-CM | POA: Diagnosis not present

## 2020-07-07 DIAGNOSIS — I6330 Cerebral infarction due to thrombosis of unspecified cerebral artery: Secondary | ICD-10-CM | POA: Diagnosis not present

## 2020-07-07 DIAGNOSIS — I10 Essential (primary) hypertension: Secondary | ICD-10-CM | POA: Diagnosis not present

## 2020-07-07 DIAGNOSIS — E039 Hypothyroidism, unspecified: Secondary | ICD-10-CM | POA: Diagnosis not present

## 2020-07-07 DIAGNOSIS — E119 Type 2 diabetes mellitus without complications: Secondary | ICD-10-CM | POA: Diagnosis not present

## 2020-07-19 DIAGNOSIS — Z95818 Presence of other cardiac implants and grafts: Secondary | ICD-10-CM | POA: Diagnosis not present

## 2020-07-19 DIAGNOSIS — Z4509 Encounter for adjustment and management of other cardiac device: Secondary | ICD-10-CM | POA: Diagnosis not present

## 2020-07-19 DIAGNOSIS — I6340 Cerebral infarction due to embolism of unspecified cerebral artery: Secondary | ICD-10-CM | POA: Diagnosis not present

## 2020-08-19 DIAGNOSIS — Z95818 Presence of other cardiac implants and grafts: Secondary | ICD-10-CM | POA: Diagnosis not present

## 2020-08-19 DIAGNOSIS — I6340 Cerebral infarction due to embolism of unspecified cerebral artery: Secondary | ICD-10-CM | POA: Diagnosis not present

## 2020-08-19 DIAGNOSIS — Z4509 Encounter for adjustment and management of other cardiac device: Secondary | ICD-10-CM | POA: Diagnosis not present

## 2020-09-18 DIAGNOSIS — E1142 Type 2 diabetes mellitus with diabetic polyneuropathy: Secondary | ICD-10-CM | POA: Diagnosis not present

## 2020-09-18 DIAGNOSIS — E119 Type 2 diabetes mellitus without complications: Secondary | ICD-10-CM | POA: Diagnosis not present

## 2020-09-18 DIAGNOSIS — E039 Hypothyroidism, unspecified: Secondary | ICD-10-CM | POA: Diagnosis not present

## 2020-09-18 DIAGNOSIS — I10 Essential (primary) hypertension: Secondary | ICD-10-CM | POA: Diagnosis not present

## 2020-09-18 DIAGNOSIS — I6330 Cerebral infarction due to thrombosis of unspecified cerebral artery: Secondary | ICD-10-CM | POA: Diagnosis not present

## 2020-09-18 DIAGNOSIS — E785 Hyperlipidemia, unspecified: Secondary | ICD-10-CM | POA: Diagnosis not present

## 2020-09-19 DIAGNOSIS — Z4509 Encounter for adjustment and management of other cardiac device: Secondary | ICD-10-CM | POA: Diagnosis not present

## 2020-09-19 DIAGNOSIS — Z95818 Presence of other cardiac implants and grafts: Secondary | ICD-10-CM | POA: Diagnosis not present

## 2020-09-19 DIAGNOSIS — I6340 Cerebral infarction due to embolism of unspecified cerebral artery: Secondary | ICD-10-CM | POA: Diagnosis not present

## 2020-10-15 DIAGNOSIS — E119 Type 2 diabetes mellitus without complications: Secondary | ICD-10-CM | POA: Diagnosis not present

## 2020-10-15 DIAGNOSIS — I6330 Cerebral infarction due to thrombosis of unspecified cerebral artery: Secondary | ICD-10-CM | POA: Diagnosis not present

## 2020-10-15 DIAGNOSIS — E785 Hyperlipidemia, unspecified: Secondary | ICD-10-CM | POA: Diagnosis not present

## 2020-10-15 DIAGNOSIS — E039 Hypothyroidism, unspecified: Secondary | ICD-10-CM | POA: Diagnosis not present

## 2020-10-15 DIAGNOSIS — I10 Essential (primary) hypertension: Secondary | ICD-10-CM | POA: Diagnosis not present

## 2020-10-15 DIAGNOSIS — E1142 Type 2 diabetes mellitus with diabetic polyneuropathy: Secondary | ICD-10-CM | POA: Diagnosis not present

## 2020-10-19 DIAGNOSIS — Z95818 Presence of other cardiac implants and grafts: Secondary | ICD-10-CM | POA: Diagnosis not present

## 2020-10-19 DIAGNOSIS — Z4509 Encounter for adjustment and management of other cardiac device: Secondary | ICD-10-CM | POA: Diagnosis not present

## 2020-10-19 DIAGNOSIS — I6340 Cerebral infarction due to embolism of unspecified cerebral artery: Secondary | ICD-10-CM | POA: Diagnosis not present

## 2020-10-20 DIAGNOSIS — E119 Type 2 diabetes mellitus without complications: Secondary | ICD-10-CM | POA: Diagnosis not present

## 2020-10-20 DIAGNOSIS — Z8673 Personal history of transient ischemic attack (TIA), and cerebral infarction without residual deficits: Secondary | ICD-10-CM | POA: Diagnosis not present

## 2020-10-20 DIAGNOSIS — R0989 Other specified symptoms and signs involving the circulatory and respiratory systems: Secondary | ICD-10-CM | POA: Diagnosis not present

## 2020-10-20 DIAGNOSIS — E039 Hypothyroidism, unspecified: Secondary | ICD-10-CM | POA: Diagnosis not present

## 2020-10-30 DIAGNOSIS — Z23 Encounter for immunization: Secondary | ICD-10-CM | POA: Diagnosis not present

## 2020-11-19 DIAGNOSIS — Z95818 Presence of other cardiac implants and grafts: Secondary | ICD-10-CM | POA: Diagnosis not present

## 2020-11-19 DIAGNOSIS — I6340 Cerebral infarction due to embolism of unspecified cerebral artery: Secondary | ICD-10-CM | POA: Diagnosis not present

## 2020-11-19 DIAGNOSIS — Z4509 Encounter for adjustment and management of other cardiac device: Secondary | ICD-10-CM | POA: Diagnosis not present

## 2020-11-30 DIAGNOSIS — Z20828 Contact with and (suspected) exposure to other viral communicable diseases: Secondary | ICD-10-CM | POA: Diagnosis not present

## 2020-12-12 DIAGNOSIS — J61 Pneumoconiosis due to asbestos and other mineral fibers: Secondary | ICD-10-CM | POA: Diagnosis not present

## 2020-12-12 DIAGNOSIS — E039 Hypothyroidism, unspecified: Secondary | ICD-10-CM | POA: Diagnosis not present

## 2020-12-12 DIAGNOSIS — Z8546 Personal history of malignant neoplasm of prostate: Secondary | ICD-10-CM | POA: Diagnosis not present

## 2020-12-12 DIAGNOSIS — I6330 Cerebral infarction due to thrombosis of unspecified cerebral artery: Secondary | ICD-10-CM | POA: Diagnosis not present

## 2020-12-12 DIAGNOSIS — E785 Hyperlipidemia, unspecified: Secondary | ICD-10-CM | POA: Diagnosis not present

## 2020-12-12 DIAGNOSIS — M130 Polyarthritis, unspecified: Secondary | ICD-10-CM | POA: Diagnosis not present

## 2020-12-12 DIAGNOSIS — M109 Gout, unspecified: Secondary | ICD-10-CM | POA: Diagnosis not present

## 2020-12-12 DIAGNOSIS — E1142 Type 2 diabetes mellitus with diabetic polyneuropathy: Secondary | ICD-10-CM | POA: Diagnosis not present

## 2020-12-12 DIAGNOSIS — Z0000 Encounter for general adult medical examination without abnormal findings: Secondary | ICD-10-CM | POA: Diagnosis not present

## 2020-12-12 DIAGNOSIS — D7589 Other specified diseases of blood and blood-forming organs: Secondary | ICD-10-CM | POA: Diagnosis not present

## 2020-12-12 DIAGNOSIS — I2584 Coronary atherosclerosis due to calcified coronary lesion: Secondary | ICD-10-CM | POA: Diagnosis not present

## 2020-12-12 DIAGNOSIS — I10 Essential (primary) hypertension: Secondary | ICD-10-CM | POA: Diagnosis not present

## 2020-12-20 DIAGNOSIS — Z4509 Encounter for adjustment and management of other cardiac device: Secondary | ICD-10-CM | POA: Diagnosis not present

## 2020-12-20 DIAGNOSIS — I6340 Cerebral infarction due to embolism of unspecified cerebral artery: Secondary | ICD-10-CM | POA: Diagnosis not present

## 2020-12-20 DIAGNOSIS — Z95818 Presence of other cardiac implants and grafts: Secondary | ICD-10-CM | POA: Diagnosis not present

## 2021-01-15 DIAGNOSIS — R350 Frequency of micturition: Secondary | ICD-10-CM | POA: Diagnosis not present

## 2021-01-20 DIAGNOSIS — I6340 Cerebral infarction due to embolism of unspecified cerebral artery: Secondary | ICD-10-CM | POA: Diagnosis not present

## 2021-01-20 DIAGNOSIS — Z95818 Presence of other cardiac implants and grafts: Secondary | ICD-10-CM | POA: Diagnosis not present

## 2021-01-20 DIAGNOSIS — Z4509 Encounter for adjustment and management of other cardiac device: Secondary | ICD-10-CM | POA: Diagnosis not present

## 2021-02-06 DIAGNOSIS — M109 Gout, unspecified: Secondary | ICD-10-CM | POA: Diagnosis not present

## 2021-02-06 DIAGNOSIS — E785 Hyperlipidemia, unspecified: Secondary | ICD-10-CM | POA: Diagnosis not present

## 2021-02-06 DIAGNOSIS — L989 Disorder of the skin and subcutaneous tissue, unspecified: Secondary | ICD-10-CM | POA: Diagnosis not present

## 2021-02-06 DIAGNOSIS — B351 Tinea unguium: Secondary | ICD-10-CM | POA: Diagnosis not present

## 2021-02-06 DIAGNOSIS — E1142 Type 2 diabetes mellitus with diabetic polyneuropathy: Secondary | ICD-10-CM | POA: Diagnosis not present

## 2021-02-06 DIAGNOSIS — I2584 Coronary atherosclerosis due to calcified coronary lesion: Secondary | ICD-10-CM | POA: Diagnosis not present

## 2021-02-06 DIAGNOSIS — M130 Polyarthritis, unspecified: Secondary | ICD-10-CM | POA: Diagnosis not present

## 2021-02-06 DIAGNOSIS — I10 Essential (primary) hypertension: Secondary | ICD-10-CM | POA: Diagnosis not present

## 2021-02-06 DIAGNOSIS — E039 Hypothyroidism, unspecified: Secondary | ICD-10-CM | POA: Diagnosis not present

## 2021-02-06 DIAGNOSIS — I6330 Cerebral infarction due to thrombosis of unspecified cerebral artery: Secondary | ICD-10-CM | POA: Diagnosis not present

## 2021-02-06 DIAGNOSIS — J61 Pneumoconiosis due to asbestos and other mineral fibers: Secondary | ICD-10-CM | POA: Diagnosis not present

## 2021-02-06 DIAGNOSIS — Z8546 Personal history of malignant neoplasm of prostate: Secondary | ICD-10-CM | POA: Diagnosis not present

## 2021-02-20 DIAGNOSIS — Z95818 Presence of other cardiac implants and grafts: Secondary | ICD-10-CM | POA: Diagnosis not present

## 2021-02-20 DIAGNOSIS — Z4509 Encounter for adjustment and management of other cardiac device: Secondary | ICD-10-CM | POA: Diagnosis not present

## 2021-02-20 DIAGNOSIS — I6340 Cerebral infarction due to embolism of unspecified cerebral artery: Secondary | ICD-10-CM | POA: Diagnosis not present

## 2021-02-24 DIAGNOSIS — B351 Tinea unguium: Secondary | ICD-10-CM | POA: Diagnosis not present

## 2021-02-24 DIAGNOSIS — M130 Polyarthritis, unspecified: Secondary | ICD-10-CM | POA: Diagnosis not present

## 2021-02-24 DIAGNOSIS — Z8546 Personal history of malignant neoplasm of prostate: Secondary | ICD-10-CM | POA: Diagnosis not present

## 2021-02-24 DIAGNOSIS — E039 Hypothyroidism, unspecified: Secondary | ICD-10-CM | POA: Diagnosis not present

## 2021-02-24 DIAGNOSIS — I10 Essential (primary) hypertension: Secondary | ICD-10-CM | POA: Diagnosis not present

## 2021-02-24 DIAGNOSIS — Z7984 Long term (current) use of oral hypoglycemic drugs: Secondary | ICD-10-CM | POA: Diagnosis not present

## 2021-02-24 DIAGNOSIS — E1142 Type 2 diabetes mellitus with diabetic polyneuropathy: Secondary | ICD-10-CM | POA: Diagnosis not present

## 2021-02-24 DIAGNOSIS — D2372 Other benign neoplasm of skin of left lower limb, including hip: Secondary | ICD-10-CM | POA: Diagnosis not present

## 2021-02-24 DIAGNOSIS — J61 Pneumoconiosis due to asbestos and other mineral fibers: Secondary | ICD-10-CM | POA: Diagnosis not present

## 2021-02-24 DIAGNOSIS — M109 Gout, unspecified: Secondary | ICD-10-CM | POA: Diagnosis not present

## 2021-02-24 DIAGNOSIS — L97429 Non-pressure chronic ulcer of left heel and midfoot with unspecified severity: Secondary | ICD-10-CM | POA: Diagnosis not present

## 2021-02-24 DIAGNOSIS — Z9181 History of falling: Secondary | ICD-10-CM | POA: Diagnosis not present

## 2021-02-24 DIAGNOSIS — L989 Disorder of the skin and subcutaneous tissue, unspecified: Secondary | ICD-10-CM | POA: Diagnosis not present

## 2021-02-24 DIAGNOSIS — I2510 Atherosclerotic heart disease of native coronary artery without angina pectoris: Secondary | ICD-10-CM | POA: Diagnosis not present

## 2021-02-24 DIAGNOSIS — E785 Hyperlipidemia, unspecified: Secondary | ICD-10-CM | POA: Diagnosis not present

## 2021-02-27 DIAGNOSIS — M109 Gout, unspecified: Secondary | ICD-10-CM | POA: Diagnosis not present

## 2021-02-27 DIAGNOSIS — E1142 Type 2 diabetes mellitus with diabetic polyneuropathy: Secondary | ICD-10-CM | POA: Diagnosis not present

## 2021-02-27 DIAGNOSIS — L97429 Non-pressure chronic ulcer of left heel and midfoot with unspecified severity: Secondary | ICD-10-CM | POA: Diagnosis not present

## 2021-02-27 DIAGNOSIS — I2510 Atherosclerotic heart disease of native coronary artery without angina pectoris: Secondary | ICD-10-CM | POA: Diagnosis not present

## 2021-02-27 DIAGNOSIS — E039 Hypothyroidism, unspecified: Secondary | ICD-10-CM | POA: Diagnosis not present

## 2021-02-27 DIAGNOSIS — D2372 Other benign neoplasm of skin of left lower limb, including hip: Secondary | ICD-10-CM | POA: Diagnosis not present

## 2021-03-02 DIAGNOSIS — I2510 Atherosclerotic heart disease of native coronary artery without angina pectoris: Secondary | ICD-10-CM | POA: Diagnosis not present

## 2021-03-02 DIAGNOSIS — M109 Gout, unspecified: Secondary | ICD-10-CM | POA: Diagnosis not present

## 2021-03-02 DIAGNOSIS — E039 Hypothyroidism, unspecified: Secondary | ICD-10-CM | POA: Diagnosis not present

## 2021-03-02 DIAGNOSIS — D2372 Other benign neoplasm of skin of left lower limb, including hip: Secondary | ICD-10-CM | POA: Diagnosis not present

## 2021-03-02 DIAGNOSIS — E1142 Type 2 diabetes mellitus with diabetic polyneuropathy: Secondary | ICD-10-CM | POA: Diagnosis not present

## 2021-03-02 DIAGNOSIS — L97429 Non-pressure chronic ulcer of left heel and midfoot with unspecified severity: Secondary | ICD-10-CM | POA: Diagnosis not present

## 2021-03-05 DIAGNOSIS — L97429 Non-pressure chronic ulcer of left heel and midfoot with unspecified severity: Secondary | ICD-10-CM | POA: Diagnosis not present

## 2021-03-05 DIAGNOSIS — M109 Gout, unspecified: Secondary | ICD-10-CM | POA: Diagnosis not present

## 2021-03-05 DIAGNOSIS — E1142 Type 2 diabetes mellitus with diabetic polyneuropathy: Secondary | ICD-10-CM | POA: Diagnosis not present

## 2021-03-05 DIAGNOSIS — I2510 Atherosclerotic heart disease of native coronary artery without angina pectoris: Secondary | ICD-10-CM | POA: Diagnosis not present

## 2021-03-05 DIAGNOSIS — E039 Hypothyroidism, unspecified: Secondary | ICD-10-CM | POA: Diagnosis not present

## 2021-03-05 DIAGNOSIS — D2372 Other benign neoplasm of skin of left lower limb, including hip: Secondary | ICD-10-CM | POA: Diagnosis not present

## 2021-03-06 DIAGNOSIS — I2510 Atherosclerotic heart disease of native coronary artery without angina pectoris: Secondary | ICD-10-CM | POA: Diagnosis not present

## 2021-03-06 DIAGNOSIS — L97429 Non-pressure chronic ulcer of left heel and midfoot with unspecified severity: Secondary | ICD-10-CM | POA: Diagnosis not present

## 2021-03-06 DIAGNOSIS — E039 Hypothyroidism, unspecified: Secondary | ICD-10-CM | POA: Diagnosis not present

## 2021-03-06 DIAGNOSIS — D2372 Other benign neoplasm of skin of left lower limb, including hip: Secondary | ICD-10-CM | POA: Diagnosis not present

## 2021-03-06 DIAGNOSIS — E1142 Type 2 diabetes mellitus with diabetic polyneuropathy: Secondary | ICD-10-CM | POA: Diagnosis not present

## 2021-03-06 DIAGNOSIS — M109 Gout, unspecified: Secondary | ICD-10-CM | POA: Diagnosis not present

## 2021-03-07 DIAGNOSIS — E1142 Type 2 diabetes mellitus with diabetic polyneuropathy: Secondary | ICD-10-CM | POA: Diagnosis not present

## 2021-03-07 DIAGNOSIS — M109 Gout, unspecified: Secondary | ICD-10-CM | POA: Diagnosis not present

## 2021-03-07 DIAGNOSIS — E039 Hypothyroidism, unspecified: Secondary | ICD-10-CM | POA: Diagnosis not present

## 2021-03-07 DIAGNOSIS — L97429 Non-pressure chronic ulcer of left heel and midfoot with unspecified severity: Secondary | ICD-10-CM | POA: Diagnosis not present

## 2021-03-07 DIAGNOSIS — D2372 Other benign neoplasm of skin of left lower limb, including hip: Secondary | ICD-10-CM | POA: Diagnosis not present

## 2021-03-07 DIAGNOSIS — I2510 Atherosclerotic heart disease of native coronary artery without angina pectoris: Secondary | ICD-10-CM | POA: Diagnosis not present

## 2021-03-12 DIAGNOSIS — M109 Gout, unspecified: Secondary | ICD-10-CM | POA: Diagnosis not present

## 2021-03-12 DIAGNOSIS — I2510 Atherosclerotic heart disease of native coronary artery without angina pectoris: Secondary | ICD-10-CM | POA: Diagnosis not present

## 2021-03-12 DIAGNOSIS — L97429 Non-pressure chronic ulcer of left heel and midfoot with unspecified severity: Secondary | ICD-10-CM | POA: Diagnosis not present

## 2021-03-12 DIAGNOSIS — D2372 Other benign neoplasm of skin of left lower limb, including hip: Secondary | ICD-10-CM | POA: Diagnosis not present

## 2021-03-12 DIAGNOSIS — E039 Hypothyroidism, unspecified: Secondary | ICD-10-CM | POA: Diagnosis not present

## 2021-03-12 DIAGNOSIS — E1142 Type 2 diabetes mellitus with diabetic polyneuropathy: Secondary | ICD-10-CM | POA: Diagnosis not present

## 2021-03-14 DIAGNOSIS — D2372 Other benign neoplasm of skin of left lower limb, including hip: Secondary | ICD-10-CM | POA: Diagnosis not present

## 2021-03-14 DIAGNOSIS — I2510 Atherosclerotic heart disease of native coronary artery without angina pectoris: Secondary | ICD-10-CM | POA: Diagnosis not present

## 2021-03-14 DIAGNOSIS — L97429 Non-pressure chronic ulcer of left heel and midfoot with unspecified severity: Secondary | ICD-10-CM | POA: Diagnosis not present

## 2021-03-14 DIAGNOSIS — E1142 Type 2 diabetes mellitus with diabetic polyneuropathy: Secondary | ICD-10-CM | POA: Diagnosis not present

## 2021-03-14 DIAGNOSIS — M109 Gout, unspecified: Secondary | ICD-10-CM | POA: Diagnosis not present

## 2021-03-14 DIAGNOSIS — E039 Hypothyroidism, unspecified: Secondary | ICD-10-CM | POA: Diagnosis not present

## 2021-03-16 DIAGNOSIS — D2372 Other benign neoplasm of skin of left lower limb, including hip: Secondary | ICD-10-CM | POA: Diagnosis not present

## 2021-03-16 DIAGNOSIS — L97429 Non-pressure chronic ulcer of left heel and midfoot with unspecified severity: Secondary | ICD-10-CM | POA: Diagnosis not present

## 2021-03-16 DIAGNOSIS — E039 Hypothyroidism, unspecified: Secondary | ICD-10-CM | POA: Diagnosis not present

## 2021-03-16 DIAGNOSIS — E1142 Type 2 diabetes mellitus with diabetic polyneuropathy: Secondary | ICD-10-CM | POA: Diagnosis not present

## 2021-03-16 DIAGNOSIS — M109 Gout, unspecified: Secondary | ICD-10-CM | POA: Diagnosis not present

## 2021-03-16 DIAGNOSIS — I2510 Atherosclerotic heart disease of native coronary artery without angina pectoris: Secondary | ICD-10-CM | POA: Diagnosis not present

## 2021-03-18 ENCOUNTER — Ambulatory Visit (INDEPENDENT_AMBULATORY_CARE_PROVIDER_SITE_OTHER): Payer: Medicare Other | Admitting: Podiatry

## 2021-03-18 ENCOUNTER — Other Ambulatory Visit: Payer: Self-pay

## 2021-03-18 DIAGNOSIS — M79675 Pain in left toe(s): Secondary | ICD-10-CM | POA: Diagnosis not present

## 2021-03-18 DIAGNOSIS — E1159 Type 2 diabetes mellitus with other circulatory complications: Secondary | ICD-10-CM

## 2021-03-18 DIAGNOSIS — B351 Tinea unguium: Secondary | ICD-10-CM

## 2021-03-18 DIAGNOSIS — M79674 Pain in right toe(s): Secondary | ICD-10-CM

## 2021-03-20 ENCOUNTER — Encounter: Payer: Self-pay | Admitting: Podiatry

## 2021-03-20 DIAGNOSIS — D2372 Other benign neoplasm of skin of left lower limb, including hip: Secondary | ICD-10-CM | POA: Diagnosis not present

## 2021-03-20 DIAGNOSIS — E1142 Type 2 diabetes mellitus with diabetic polyneuropathy: Secondary | ICD-10-CM | POA: Diagnosis not present

## 2021-03-20 DIAGNOSIS — L97429 Non-pressure chronic ulcer of left heel and midfoot with unspecified severity: Secondary | ICD-10-CM | POA: Diagnosis not present

## 2021-03-20 DIAGNOSIS — M109 Gout, unspecified: Secondary | ICD-10-CM | POA: Diagnosis not present

## 2021-03-20 DIAGNOSIS — I2510 Atherosclerotic heart disease of native coronary artery without angina pectoris: Secondary | ICD-10-CM | POA: Diagnosis not present

## 2021-03-20 DIAGNOSIS — E039 Hypothyroidism, unspecified: Secondary | ICD-10-CM | POA: Diagnosis not present

## 2021-03-20 NOTE — Progress Notes (Signed)
°  Subjective:  Patient ID: Alan Rubio, male    DOB: 1931-05-19,  MRN: [id number]  Chief Complaint  Patient presents with   Nail Problem    Nail trim    88 y.o. male returns for the above complaint.  Patient presents with thickened elongated dystrophic toenails x10.  Patient is a type II diabetic with last A1c of 7.9.  Patient states is working on glucose control.  He denies any other acute complaints he would like to have his nails debrided down as he is not able to do it himself.  He has mild pain to them.  Objective:  There were no vitals filed for this visit. Podiatric Exam: Vascular: dorsalis pedis and posterior tibial pulses are palpable bilateral. Capillary return is immediate. Temperature gradient is WNL. Skin turgor WNL  Sensorium: Normal Semmes Weinstein monofilament test. Normal tactile sensation bilaterally. Nail Exam: Pt has thick disfigured discolored nails with subungual debris noted bilateral entire nail hallux through fifth toenails.  Pain on palpation to the nails. Ulcer Exam: There is no evidence of ulcer or pre-ulcerative changes or infection. Orthopedic Exam: Muscle tone and strength are WNL. No limitations in general ROM. No crepitus or effusions noted. HAV  B/L.  Hammer toes 2-5  B/L. Skin: No Porokeratosis. No infection or ulcers    Assessment & Plan:   1. Pain due to onychomycosis of toenails of both feet   2. Type 2 diabetes mellitus with other circulatory complication, without long-term current use of insulin ([place])     Patient was evaluated and treated and all questions answered.  Onychomycosis with pain  -Nails palliatively debrided as below. -Educated on self-care  Procedure: Nail Debridement Rationale: pain  Type of Debridement: manual, sharp debridement. Instrumentation: Nail nipper, rotary burr. Number of Nails: 10  Procedures and Treatment: Consent by patient was obtained for treatment procedures. The patient understood the discussion of treatment  and procedures well. All questions were answered thoroughly reviewed. Debridement of mycotic and hypertrophic toenails, 1 through 5 bilateral and clearing of subungual debris. No ulceration, no infection noted.  Return Visit-Office Procedure: Patient instructed to return to the office for a follow up visit 3 months for continued evaluation and treatment.  Boneta Lucks, DPM    No follow-ups on file.

## 2021-03-23 DIAGNOSIS — Z4509 Encounter for adjustment and management of other cardiac device: Secondary | ICD-10-CM | POA: Diagnosis not present

## 2021-03-23 DIAGNOSIS — E039 Hypothyroidism, unspecified: Secondary | ICD-10-CM | POA: Diagnosis not present

## 2021-03-23 DIAGNOSIS — D2372 Other benign neoplasm of skin of left lower limb, including hip: Secondary | ICD-10-CM | POA: Diagnosis not present

## 2021-03-23 DIAGNOSIS — I6340 Cerebral infarction due to embolism of unspecified cerebral artery: Secondary | ICD-10-CM | POA: Diagnosis not present

## 2021-03-23 DIAGNOSIS — L97429 Non-pressure chronic ulcer of left heel and midfoot with unspecified severity: Secondary | ICD-10-CM | POA: Diagnosis not present

## 2021-03-23 DIAGNOSIS — I2510 Atherosclerotic heart disease of native coronary artery without angina pectoris: Secondary | ICD-10-CM | POA: Diagnosis not present

## 2021-03-23 DIAGNOSIS — Z95818 Presence of other cardiac implants and grafts: Secondary | ICD-10-CM | POA: Diagnosis not present

## 2021-03-23 DIAGNOSIS — M109 Gout, unspecified: Secondary | ICD-10-CM | POA: Diagnosis not present

## 2021-03-23 DIAGNOSIS — E1142 Type 2 diabetes mellitus with diabetic polyneuropathy: Secondary | ICD-10-CM | POA: Diagnosis not present

## 2021-03-24 DIAGNOSIS — M109 Gout, unspecified: Secondary | ICD-10-CM | POA: Diagnosis not present

## 2021-03-24 DIAGNOSIS — D2372 Other benign neoplasm of skin of left lower limb, including hip: Secondary | ICD-10-CM | POA: Diagnosis not present

## 2021-03-24 DIAGNOSIS — E1142 Type 2 diabetes mellitus with diabetic polyneuropathy: Secondary | ICD-10-CM | POA: Diagnosis not present

## 2021-03-24 DIAGNOSIS — I2510 Atherosclerotic heart disease of native coronary artery without angina pectoris: Secondary | ICD-10-CM | POA: Diagnosis not present

## 2021-03-24 DIAGNOSIS — E039 Hypothyroidism, unspecified: Secondary | ICD-10-CM | POA: Diagnosis not present

## 2021-03-24 DIAGNOSIS — L97429 Non-pressure chronic ulcer of left heel and midfoot with unspecified severity: Secondary | ICD-10-CM | POA: Diagnosis not present

## 2021-03-25 DIAGNOSIS — L97429 Non-pressure chronic ulcer of left heel and midfoot with unspecified severity: Secondary | ICD-10-CM | POA: Diagnosis not present

## 2021-03-25 DIAGNOSIS — I2510 Atherosclerotic heart disease of native coronary artery without angina pectoris: Secondary | ICD-10-CM | POA: Diagnosis not present

## 2021-03-25 DIAGNOSIS — M109 Gout, unspecified: Secondary | ICD-10-CM | POA: Diagnosis not present

## 2021-03-25 DIAGNOSIS — E1142 Type 2 diabetes mellitus with diabetic polyneuropathy: Secondary | ICD-10-CM | POA: Diagnosis not present

## 2021-03-25 DIAGNOSIS — E039 Hypothyroidism, unspecified: Secondary | ICD-10-CM | POA: Diagnosis not present

## 2021-03-25 DIAGNOSIS — D2372 Other benign neoplasm of skin of left lower limb, including hip: Secondary | ICD-10-CM | POA: Diagnosis not present

## 2021-03-26 DIAGNOSIS — I10 Essential (primary) hypertension: Secondary | ICD-10-CM | POA: Diagnosis not present

## 2021-03-26 DIAGNOSIS — M130 Polyarthritis, unspecified: Secondary | ICD-10-CM | POA: Diagnosis not present

## 2021-03-26 DIAGNOSIS — L97429 Non-pressure chronic ulcer of left heel and midfoot with unspecified severity: Secondary | ICD-10-CM | POA: Diagnosis not present

## 2021-03-26 DIAGNOSIS — E785 Hyperlipidemia, unspecified: Secondary | ICD-10-CM | POA: Diagnosis not present

## 2021-03-26 DIAGNOSIS — D2372 Other benign neoplasm of skin of left lower limb, including hip: Secondary | ICD-10-CM | POA: Diagnosis not present

## 2021-03-26 DIAGNOSIS — E1142 Type 2 diabetes mellitus with diabetic polyneuropathy: Secondary | ICD-10-CM | POA: Diagnosis not present

## 2021-03-26 DIAGNOSIS — I2510 Atherosclerotic heart disease of native coronary artery without angina pectoris: Secondary | ICD-10-CM | POA: Diagnosis not present

## 2021-03-26 DIAGNOSIS — Z20822 Contact with and (suspected) exposure to covid-19: Secondary | ICD-10-CM | POA: Diagnosis not present

## 2021-03-26 DIAGNOSIS — L989 Disorder of the skin and subcutaneous tissue, unspecified: Secondary | ICD-10-CM | POA: Diagnosis not present

## 2021-03-26 DIAGNOSIS — Z8546 Personal history of malignant neoplasm of prostate: Secondary | ICD-10-CM | POA: Diagnosis not present

## 2021-03-26 DIAGNOSIS — M109 Gout, unspecified: Secondary | ICD-10-CM | POA: Diagnosis not present

## 2021-03-26 DIAGNOSIS — Z9181 History of falling: Secondary | ICD-10-CM | POA: Diagnosis not present

## 2021-03-26 DIAGNOSIS — J61 Pneumoconiosis due to asbestos and other mineral fibers: Secondary | ICD-10-CM | POA: Diagnosis not present

## 2021-03-26 DIAGNOSIS — B351 Tinea unguium: Secondary | ICD-10-CM | POA: Diagnosis not present

## 2021-03-26 DIAGNOSIS — Z7984 Long term (current) use of oral hypoglycemic drugs: Secondary | ICD-10-CM | POA: Diagnosis not present

## 2021-03-26 DIAGNOSIS — E039 Hypothyroidism, unspecified: Secondary | ICD-10-CM | POA: Diagnosis not present

## 2021-03-27 DIAGNOSIS — E039 Hypothyroidism, unspecified: Secondary | ICD-10-CM | POA: Diagnosis not present

## 2021-03-27 DIAGNOSIS — L97429 Non-pressure chronic ulcer of left heel and midfoot with unspecified severity: Secondary | ICD-10-CM | POA: Diagnosis not present

## 2021-03-27 DIAGNOSIS — M109 Gout, unspecified: Secondary | ICD-10-CM | POA: Diagnosis not present

## 2021-03-27 DIAGNOSIS — D2372 Other benign neoplasm of skin of left lower limb, including hip: Secondary | ICD-10-CM | POA: Diagnosis not present

## 2021-03-27 DIAGNOSIS — I2510 Atherosclerotic heart disease of native coronary artery without angina pectoris: Secondary | ICD-10-CM | POA: Diagnosis not present

## 2021-03-27 DIAGNOSIS — E1142 Type 2 diabetes mellitus with diabetic polyneuropathy: Secondary | ICD-10-CM | POA: Diagnosis not present

## 2021-03-30 DIAGNOSIS — I2510 Atherosclerotic heart disease of native coronary artery without angina pectoris: Secondary | ICD-10-CM | POA: Diagnosis not present

## 2021-03-30 DIAGNOSIS — E1142 Type 2 diabetes mellitus with diabetic polyneuropathy: Secondary | ICD-10-CM | POA: Diagnosis not present

## 2021-03-30 DIAGNOSIS — M109 Gout, unspecified: Secondary | ICD-10-CM | POA: Diagnosis not present

## 2021-03-30 DIAGNOSIS — D2372 Other benign neoplasm of skin of left lower limb, including hip: Secondary | ICD-10-CM | POA: Diagnosis not present

## 2021-03-30 DIAGNOSIS — Z20828 Contact with and (suspected) exposure to other viral communicable diseases: Secondary | ICD-10-CM | POA: Diagnosis not present

## 2021-03-30 DIAGNOSIS — E039 Hypothyroidism, unspecified: Secondary | ICD-10-CM | POA: Diagnosis not present

## 2021-03-30 DIAGNOSIS — L97429 Non-pressure chronic ulcer of left heel and midfoot with unspecified severity: Secondary | ICD-10-CM | POA: Diagnosis not present

## 2021-04-01 ENCOUNTER — Other Ambulatory Visit: Payer: Self-pay

## 2021-04-01 ENCOUNTER — Encounter (HOSPITAL_BASED_OUTPATIENT_CLINIC_OR_DEPARTMENT_OTHER): Payer: Medicare Other | Attending: Internal Medicine | Admitting: Physician Assistant

## 2021-04-01 DIAGNOSIS — E039 Hypothyroidism, unspecified: Secondary | ICD-10-CM | POA: Diagnosis not present

## 2021-04-01 DIAGNOSIS — L97222 Non-pressure chronic ulcer of left calf with fat layer exposed: Secondary | ICD-10-CM | POA: Diagnosis not present

## 2021-04-01 DIAGNOSIS — L97822 Non-pressure chronic ulcer of other part of left lower leg with fat layer exposed: Secondary | ICD-10-CM | POA: Diagnosis not present

## 2021-04-01 DIAGNOSIS — L988 Other specified disorders of the skin and subcutaneous tissue: Secondary | ICD-10-CM | POA: Insufficient documentation

## 2021-04-01 DIAGNOSIS — E11622 Type 2 diabetes mellitus with other skin ulcer: Secondary | ICD-10-CM | POA: Insufficient documentation

## 2021-04-01 DIAGNOSIS — D2372 Other benign neoplasm of skin of left lower limb, including hip: Secondary | ICD-10-CM | POA: Diagnosis not present

## 2021-04-01 DIAGNOSIS — I10 Essential (primary) hypertension: Secondary | ICD-10-CM | POA: Diagnosis not present

## 2021-04-01 DIAGNOSIS — M109 Gout, unspecified: Secondary | ICD-10-CM | POA: Diagnosis not present

## 2021-04-01 DIAGNOSIS — I2510 Atherosclerotic heart disease of native coronary artery without angina pectoris: Secondary | ICD-10-CM | POA: Diagnosis not present

## 2021-04-01 DIAGNOSIS — E1142 Type 2 diabetes mellitus with diabetic polyneuropathy: Secondary | ICD-10-CM | POA: Diagnosis not present

## 2021-04-01 DIAGNOSIS — C44729 Squamous cell carcinoma of skin of left lower limb, including hip: Secondary | ICD-10-CM | POA: Insufficient documentation

## 2021-04-01 DIAGNOSIS — L97429 Non-pressure chronic ulcer of left heel and midfoot with unspecified severity: Secondary | ICD-10-CM | POA: Diagnosis not present

## 2021-04-01 NOTE — Progress Notes (Signed)
Alan, Rubio ([id number]) Visit Report for 04/01/2021 Biopsy Details Patient Name: Date of Service: Alan Rubio, Alan 04/01/2021 1:15 PM Medical Record Number: [id number] Patient Account Number: [id number] Date of Birth/Sex: Treating RN: 10/07/1931 (88 y.o. [name] Primary Care Provider: Aura Rubio Other Clinician: Referring Provider: Treating Provider/Extender: Alan Rubio in Treatment: 0 Biopsy Performed for: Wound #1 Left, Posterior Lower Leg Location(s): Wound Bed Performed By: Physician Alan Keeler, PA Tissue Punch: Yes Size (mm): 5 Number of Specimens T aken: 1 Specimen Sent T Pathology: o Yes Level of Consciousness (Pre-procedure): Awake and Alert Pre-procedure Verification/Time-Out Taken: Yes - 14:08 Pain Control: Lidocaine Injectable Lidocaine Percent: 1% Instrument: Forceps, Scissors Bleeding: Moderate Hemostasis Achieved: Silver Nitrate Response to Treatment: Procedure was tolerated well Level of Consciousness (Post-procedure): Awake and Alert Post Procedure Diagnosis Same as Pre-procedure Electronic Signature(s) Signed: 04/01/2021 3:53:22 PM By: Alan Keeler PA-C Signed: 04/01/2021 4:30:06 PM By: Alan Rubio Entered By: Alan Rubio on 04/01/2021 14:16:35 -------------------------------------------------------------------------------- Chief Complaint Document Details Patient Name: Date of Service: Alan Rubio, Alan 04/01/2021 1:15 PM Medical Record Number: [id number] Patient Account Number: [id number] Date of Birth/Sex: Treating RN: Mar 23, 1931 (88 y.o. Alan Rubio Primary Care Provider: Aura Rubio Other Clinician: Referring Provider: Treating Provider/Extender: Alan Rubio in Treatment: 0 Information Obtained from: Patient Chief Complaint Left LE Ulcer Electronic Signature(s) Signed: 04/01/2021 1:56:09 PM By: Alan Keeler PA-C Previous Signature: 04/01/2021 1:21:09 PM Version By: Alan Keeler PA-C Entered By: Alan Rubio on 04/01/2021 13:56:09 -------------------------------------------------------------------------------- HPI Details Patient Name: Date of Service: Alan Rubio, Alan 04/01/2021 1:15 PM Medical Record Number: [id number] Patient Account Number: [id number] Date of Birth/Sex: Treating RN: November 11, 1931 (88 y.o. Alan Rubio Primary Care Provider: Aura Rubio Other Clinician: Referring Provider: Treating Provider/Extender: Alan Rubio in Treatment: 0 History of Present Illness HPI Description: 04/01/2021 upon evaluation today patient presents for initial inspection here in the clinic concerning issues that has been having with a wound on the left lateral/posterior leg which has been present he tells me for at least a year. He is not exactly sure how this, but seem to just be gradual appearing. With that being said he was seen by his primary care provider for a time they were not able to get this healed. He also has home health coming out they have been trying to help out as well. Unfortunately this just does not seem to be healing as quickly as he would like to see in fact he is really not seeing much in the way of healing at all. There is definitely overgrowth that is almost mushroom doubt this really has the appearance in my opinion of likely being a skin cancer. Possibly squamous cell carcinoma. With that being said I do think that a biopsy for confirmation would be warranted in this case. That was discussed with the patient today as well. With that being said the patient does have a history of diabetes mellitus type 2, hypertension, and again that he has been using some medications on this home health has been performing the medication and dressing changes for him currently. He does not have any history of skin cancer that he is aware of. He does have an ABI that was performed May 16, 2020 and showed to be 1.10 on this left side.  His most recent hemoglobin A1c was 8.4. Electronic Signature(s) Signed: 04/01/2021 2:26:17 PM By: Alan Keeler PA-C Entered By: Alan Rubio  on 04/01/2021 14:26:17 -------------------------------------------------------------------------------- Physical Exam Details Patient Name: Date of Service: Alan Rubio, Alan 04/01/2021 1:15 PM Medical Record Number: [id number] Patient Account Number: [id number] Date of Birth/Sex: Treating RN: 09-22-1931 (88 y.o. Alan Rubio Primary Care Provider: Aura Rubio Other Clinician: Referring Provider: Treating Provider/Extender: Alan Rubio in Treatment: 0 Constitutional patient is hypertensive.. pulse regular and within target range for patient.Marland Kitchen respirations regular, non-labored and within target range for patient.Marland Kitchen temperature within target range for patient.. Well-nourished and well-hydrated in no acute distress. Eyes conjunctiva clear no eyelid edema noted. pupils equal round and reactive to light and accommodation. Ears, Nose, Mouth, and Throat no gross abnormality of ear auricles or external auditory canals. normal hearing noted during conversation. mucus membranes moist. Respiratory normal breathing without difficulty. Cardiovascular 2+ dorsalis pedis/posterior tibialis pulses. no clubbing, cyanosis, significant edema, <3 sec cap refill. Musculoskeletal normal gait and posture. no significant deformity or arthritic changes, no loss or range of motion, no clubbing. Psychiatric this patient is able to make decisions and demonstrates good insight into disease process. Alert and Oriented x 3. pleasant and cooperative. Notes Upon inspection patient's wound that she did have a mushroom-like appearance above the surface of the skin which is very consistent in my opinion with a skin cancer. I do believe that this is likely what this is representing and I do believe that he needs a biopsy for confirmation so that we can  seek definitive treatment which is likely to be in the way of Mohs surgery. I discussed this with the patient and his son-in-law who are both present during the visit today in its entirety. I did after numbing this with 2% lidocaine and 3 cc total perform a 5 mm punch biopsy which the patient tolerated today without complication there was some bleeding hemostasis achieved with the use of chemical cautery with silver nitrate. Electronic Signature(s) Signed: 04/01/2021 2:27:07 PM By: Alan Keeler PA-C Entered By: Alan Rubio on 04/01/2021 14:27:06 -------------------------------------------------------------------------------- Physician Orders Details Patient Name: Date of Service: Alan Rubio, Griffey 04/01/2021 1:15 PM Medical Record Number: [id number] Patient Account Number: [id number] Date of Birth/Sex: Treating RN: Apr 04, 1931 (88 y.o. [name] Primary Care Provider: Aura Rubio Other Clinician: Referring Provider: Treating Provider/Extender: Alan Rubio in Treatment: 0 Verbal / Phone Orders: No Diagnosis Coding ICD-10 Coding Code Description L98.8 Other specified disorders of the skin and subcutaneous tissue E11.622 Type 2 diabetes mellitus with other skin ulcer L97.822 Non-pressure chronic ulcer of other part of left lower leg with fat layer exposed I10 Essential (primary) hypertension Follow-up Appointments ppointment in 2 weeks. - with Alyson Reedy [facility], Room 7) Return A Bathing/ Shower/ Hygiene May shower and wash wound with soap and water. - May shower when changing dressing. Additional Orders / Instructions Follow [place] wound care orders this week; continue Home Health for wound care. May utilize formulary equivalent dressing for wound treatment orders unless otherwise specified. - Skilled nursing for dressing changes 2x week Other Home Health Orders/Instructions: - [facility] Wound Treatment Wound #1 - Lower Leg  Wound Laterality: Left, Posterior Cleanser: Soap and Water ([place]) 2 x Per Week/30 Days Discharge Instructions: May shower and wash wound with dial antibacterial soap and water prior to dressing change. Cleanser: Wound Cleanser (Home Health) 2 x Per Week/30 Days Discharge Instructions: Cleanse the wound with wound cleanser prior to applying a clean dressing using gauze sponges, not tissue or cotton balls. Peri-Wound Care: Skin  Prep (Home Health) 2 x Per Week/30 Days Discharge Instructions: Use skin prep as directed Prim Dressing: Hydrofera Blue Ready Foam, 2.5 x2.5 in (Home Health) 2 x Per Week/30 Days ary Discharge Instructions: Apply to wound bed as instructed Secondary Dressing: Zetuvit Plus Silicone Border Dressing 4x4 (in/in) ([place]) 2 x Per Week/30 Days Discharge Instructions: Apply silicone border over primary dressing as directed. Laboratory Bacteria identified in Tissue by Biopsy culture (MICRO) - (ICD10 L98.8 - Other specified disorders of the skin and subcutaneous tissue) LOINC Code: [phone] Convenience Name: Biopsy specimen culture Electronic Signature(s) Signed: 04/01/2021 3:53:22 PM By: Alan Keeler PA-C Signed: 04/01/2021 4:30:06 PM By: Alan Rubio Entered By: Alan Rubio on 04/01/2021 14:15:06 -------------------------------------------------------------------------------- Problem List Details Patient Name: Date of Service: Alan Rubio, Renzo 04/01/2021 1:15 PM Medical Record Number: [id number] Patient Account Number: [id number] Date of Birth/Sex: Treating RN: 1931/06/09 (88 y.o. 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 Alan Rubio Primary Care Provider: Aura Rubio Other Clinician: Referring Provider: Treating Provider/Extender: Alan Rubio in Treatment: 0 Subjective Chief Complaint Information obtained from Patient Left LE Ulcer History of Present Illness (HPI) 04/01/2021 upon evaluation today patient presents for initial inspection here in the clinic concerning issues that has been having with a wound on the left lateral/posterior leg which has been present he tells me for at least a year. He is not exactly sure how this, but seem to just be gradual appearing. With that being said he was seen by his primary care provider for a time they were not able to get this healed. He also has home health coming out they have been trying to help out as well. Unfortunately this just does not seem to be healing as quickly as he would like to see in fact he is really not seeing much in the way of healing at all. There is definitely overgrowth that is almost mushroom doubt this really has the appearance in my opinion of likely being a skin cancer. Possibly squamous cell carcinoma. With that being said I do think that a biopsy for confirmation would be warranted in this case.  That was discussed with the patient today as well. With that being said the patient does have a history of diabetes mellitus type 2, hypertension, and again that he has been using some medications on this home health has been performing the medication and dressing changes for him currently. He does not have any history of skin cancer that he is aware of. He does have an ABI that was performed May 16, 2020 and showed to be 1.10 on this left side. His most recent hemoglobin A1c was 8.4. Patient History  Information obtained from Patient. Allergies Penicillins (Severity: Moderate, Reaction: rash) Family History Cancer - Siblings, Heart Disease - Father, Hypertension - Father, Stroke - Mother, No family history of Diabetes, Hereditary Spherocytosis, Kidney Disease, Lung Disease, Seizures, Thyroid Problems, Tuberculosis. Social History Never smoker, Marital Status - Married, Alcohol Use - Never, Drug Use - No History, Caffeine Use - Rarely. Medical History Eyes Patient has history of Cataracts Hematologic/Lymphatic Patient has history of Anemia Cardiovascular Patient has history of Arrhythmia, Hypertension Endocrine Patient has history of Type II Diabetes Oncologic Patient has history of Received Radiation Medical A Surgical History Notes nd Cardiovascular hyperlipemia, Hx CVA 2020, Endocrine Hypothyroidism, gout Oncologic Prostate Cancer 17 years ago Review of Systems (ROS) Eyes Denies complaints or symptoms of Dry Eyes, Vision Changes, Glasses / Contacts. Ear/Nose/Mouth/Throat Denies complaints or symptoms of Chronic sinus problems or rhinitis. Respiratory Denies complaints or symptoms of Chronic or frequent coughs, Shortness of Breath. Gastrointestinal Denies complaints or symptoms of Frequent diarrhea, Nausea, Vomiting. Genitourinary Denies complaints or symptoms of Frequent urination. Integumentary (Skin) Complains or has symptoms of Wounds. Musculoskeletal Denies  complaints or symptoms of Muscle Pain, Muscle Weakness. Neurologic Denies complaints or symptoms of Numbness/parasthesias. Psychiatric Denies complaints or symptoms of Claustrophobia, Suicidal. Objective Constitutional patient is hypertensive.. pulse regular and within target range for patient.Marland Kitchen respirations regular, non-labored and within target range for patient.Marland Kitchen temperature within target range for patient.. Well-nourished and well-hydrated in no acute distress. Vitals Time Taken: 1:06 PM, Height: 71 in, Source: Stated, Weight: 175 lbs, Source: Stated, BMI: 24.4, Temperature: 97.7 F, Pulse: 72 bpm, Respiratory Rate: 18 breaths/min, Blood Pressure: 160/71 mmHg, Capillary Blood Glucose: 130 mg/dl. Eyes conjunctiva clear no eyelid edema noted. pupils equal round and reactive to light and accommodation. Ears, Nose, Mouth, and Throat no gross abnormality of ear auricles or external auditory canals. normal hearing noted during conversation. mucus membranes moist. Respiratory normal breathing without difficulty. Cardiovascular 2+ dorsalis pedis/posterior tibialis pulses. no clubbing, cyanosis, significant edema, Musculoskeletal normal gait and posture. no significant deformity or arthritic changes, no loss or range of motion, no clubbing. Psychiatric this patient is able to make decisions and demonstrates good insight into disease process. Alert and Oriented x 3. pleasant and cooperative. General Notes: Upon inspection patient's wound that she did have a mushroom-like appearance above the surface of the skin which is very consistent in my opinion with a skin cancer. I do believe that this is likely what this is representing and I do believe that he needs a biopsy for confirmation so that we can seek definitive treatment which is likely to be in the way of Mohs surgery. I discussed this with the patient and his son-in-law who are both present during the visit today in its entirety. I did after  numbing this with 2% lidocaine and 3 cc total perform a 5 mm punch biopsy which the patient tolerated today without complication there was some bleeding hemostasis achieved with the use of chemical cautery with silver nitrate. Integumentary (Hair, Skin) Wound #1 status is Open. Original cause of wound was Gradually Appeared. The date acquired was: 05/05/2020. The wound is located on the Left,Posterior Lower Leg. The wound measures 1.5cm length x 1.6cm width x 0.1cm depth; 1.885cm^2 area and 0.188cm^3 volume. There is Fat Layer (Subcutaneous Tissue) exposed. There is no tunneling or undermining noted. There is a medium amount of sanguinous drainage noted. The wound margin is distinct with the outline attached to the wound base. There is large (67-100%) red, friable, hyper - granulation within  the wound bed. There is no necrotic tissue within the wound bed. Assessment Active Problems ICD-10 Other specified disorders of the skin and subcutaneous tissue Type 2 diabetes mellitus with other skin ulcer Non-pressure chronic ulcer of other part of left lower leg with fat layer exposed Essential (primary) hypertension Procedures Wound #1 Pre-procedure diagnosis of Wound #1 is an Atypical located on the Left, Posterior Lower Leg . There was a biopsy performed by Alan Keeler, PA. There was a biopsy performed on Wound Bed. The skin was cleansed and prepped with anti-septic followed by pain control using Lidocaine Injectable: 1%. Utilizing a 5 mm tissue punch, tissue was removed at its base with the following instrument(s): Forceps and Scissors and sent to pathology. A Moderate amount of bleeding was controlled with Silver Nitrate. A time out was conducted at 14:08, prior to the start of the procedure. The procedure was tolerated well. Post procedure Diagnosis Wound #1: Same as Pre-Procedure Plan Follow-up Appointments: Return Appointment in 2 weeks. - with Alyson Reedy [facility], Room 7) Bathing/ Shower/  Hygiene: May shower and wash wound with soap and water. - May shower when changing dressing. Additional Orders / Instructions: Follow Nutritious Diet Home Health: New wound care orders this week; continue Home Health for wound care. May utilize formulary equivalent dressing for wound treatment orders unless otherwise specified. - Skilled nursing for dressing changes 2x week Other Home Health Orders/Instructions: - [phone] HH Laboratory ordered were: Biopsy specimen culture WOUND #1: - Lower Leg Wound Laterality: Left, Posterior Cleanser: Soap and Water ([place]) 2 x Per Week/30 Days Discharge Instructions: May shower and wash wound with dial antibacterial soap and water prior to dressing change. Cleanser: Wound Cleanser (Home Health) 2 x Per Week/30 Days Discharge Instructions: Cleanse the wound with wound cleanser prior to applying a clean dressing using gauze sponges, not tissue or cotton balls. Peri-Wound Care: Skin Prep (Home Health) 2 x Per Week/30 Days Discharge Instructions: Use skin prep as directed Prim Dressing: Hydrofera Blue Ready Foam, 2.5 x2.5 in (Home Health) 2 x Per Week/30 Days ary Discharge Instructions: Apply to wound bed as instructed Secondary Dressing: Zetuvit Plus Silicone Border Dressing 4x4 (in/in) ([place]) 2 x Per Week/30 Days Discharge Instructions: Apply silicone border over primary dressing as directed. 1. Would recommend currently based on what I am seeing that we initiate treatment with Hydrofera Blue. I think this will be one of the best ways to go currently. 2. I am also can recommend that we have the patient cover this with a Zetuvit border foam dressing which I think is appropriate. 3. I would also recommend that he continue to monitor for any signs of worsening or infection if anything changes he should let me know otherwise I will get an see what the pathology shows and then we will make appropriate referrals pending on where we are at  following. We will see patient back for reevaluation in 2 weeks here in the clinic. If anything worsens or changes patient will contact our office for additional recommendations. Electronic Signature(s) Signed: 04/01/2021 2:27:56 PM By: Alan Keeler PA-C Entered By: Alan Rubio on 04/01/2021 14:27:56 -------------------------------------------------------------------------------- HxROS Details Patient Name: Date of Service: Alan Rubio, Demonta 04/01/2021 1:15 PM Medical Record Number: [id number] Patient Account Number: [id number] Date of Birth/Sex: Treating RN: 04/17/1931 (88 y.o. Alan Rubio Primary Care Provider: Aura Rubio Other Clinician: Referring Provider: Treating Provider/Extender: Alan Rubio in Treatment: 0 Information Obtained From Patient Eyes Complaints and Symptoms:  Negative for: Dry Eyes; Vision Changes; Glasses / Contacts Medical History: Positive for: Cataracts Ear/Nose/Mouth/Throat Complaints and Symptoms: Negative for: Chronic sinus problems or rhinitis Respiratory Complaints and Symptoms: Negative for: Chronic or frequent coughs; Shortness of Breath Gastrointestinal Complaints and Symptoms: Negative for: Frequent diarrhea; Nausea; Vomiting Genitourinary Complaints and Symptoms: Negative for: Frequent urination Integumentary (Skin) Complaints and Symptoms: Positive for: Wounds Musculoskeletal Complaints and Symptoms: Negative for: Muscle Pain; Muscle Weakness Neurologic Complaints and Symptoms: Negative for: Numbness/parasthesias Psychiatric Complaints and Symptoms: Negative for: Claustrophobia; Suicidal Hematologic/Lymphatic Medical History: Positive for: Anemia Cardiovascular Medical History: Positive for: Arrhythmia; Hypertension Past Medical History Notes: hyperlipemia, Hx CVA 2020, Endocrine Medical History: Positive for: Type II Diabetes Past Medical History Notes: Hypothyroidism, gout Time with  diabetes: 12 years Immunological Oncologic Medical History: Positive for: Received Radiation Past Medical History Notes: Prostate Cancer 17 years ago HBO Extended History Items Eyes: Cataracts Immunizations Pneumococcal Vaccine: Received Pneumococcal Vaccination: No Implantable Devices Yes Family and Social History Cancer: Yes - Siblings; Diabetes: No; Heart Disease: Yes - Father; Hereditary Spherocytosis: No; Hypertension: Yes - Father; Kidney Disease: No; Lung Disease: No; Seizures: No; Stroke: Yes - Mother; Thyroid Problems: No; Tuberculosis: No; Never smoker; Marital Status - Married; Alcohol Use: Never; Drug Use: No History; Caffeine Use: Rarely; Financial Concerns: No; Food, Clothing or Shelter Needs: No; Support System Lacking: No; Transportation Concerns: No Electronic Signature(s) Signed: 04/01/2021 3:53:22 PM By: Alan Keeler PA-C Signed: 04/01/2021 4:18:19 PM By: Rhae Hammock RN Signed: 04/01/2021 4:30:06 PM By: Alan Rubio Entered By: Alan Rubio on 04/01/2021 13:16:30 -------------------------------------------------------------------------------- SuperBill Details Patient Name: Date of Service: Alan Rubio, Fabrice 04/01/2021 Medical Record Number: [id number] Patient Account Number: [id number] Date of Birth/Sex: Treating RN: [birth year] (88 y.o. Alan Rubio Primary Care Provider: Aura Rubio Other Clinician: Referring Provider: Treating Provider/Extender: Alan Rubio in Treatment: 0 Diagnosis Coding ICD-10 Codes Code Description [phone] Other specified disorders of the skin and subcutaneous tissue E11.622 Type 2 diabetes mellitus with other skin ulcer L97.822 Non-pressure chronic ulcer of other part of left lower leg with fat layer exposed I10 Essential (primary) hypertension Facility Procedures CPT4 Code: [id number] Description: [place] VISIT-LEV 3 EST PT Modifier: 25 Quantity: 1 CPT4 Code: [id number]  1 Description: 1104-Punch biopsy of skin (including simple closure, when performed) single lesion ICD-10 Diagnosis Description L98.8 Other specified disorders of the skin and subcutaneous tissue L97.822 Non-pressure chronic ulcer of other part of left  lower leg with fat layer exposed Modifier: Quantity: 1 Physician Procedures : CPT4 Code Description Modifier [id number] - WC PHYS LEVEL 4 - NEW PT ICD-10 Diagnosis Description L98.8 Other specified disorders of the skin and subcutaneous tissue E11.622 Type 2 diabetes mellitus with other skin ulcer L97.822 Non-pressure chronic  ulcer of other part of left lower leg with fat layer exposed I10 Essential (primary) hypertension Quantity: 1 : 11104 Punch biopsy of skin (including simple closure, when performed) single lesion ICD-10 Diagnosis Description L98.8 Other specified disorders of the skin and subcutaneous tissue L97.822 Non-pressure chronic ulcer of other part of left lower leg with  fat layer exposed Quantity: 1 Electronic Signature(s) Signed: 04/01/2021 3:53:22 PM By: Alan Keeler PA-C Signed: 04/01/2021 4:30:06 PM By: Alan Rubio Previous Signature: 04/01/2021 2:28:19 PM Version By: Alan Keeler PA-C Entered By: Alan Rubio on 04/01/2021 14:29:51

## 2021-04-01 NOTE — Progress Notes (Signed)
AURYN, PAIGE ([id number]) Visit Report for 04/01/2021 Allergy List Details Patient Name: Date of Service: Alan Rubio, Alan Rubio 04/01/2021 1:15 PM Medical Record Number: [id number] Patient Account Number: [id number] Date of Birth/Sex: Treating RN: 05-01-1931 (88 y.o. Alan Rubio Primary Care Alan Rubio: Alan Rubio Other Clinician: Referring Alan Rubio: Treating Alan Rubio/Extender: Alan Rubio in Treatment: 0 Allergies Active Allergies Penicillins Reaction: rash Severity: Moderate Allergy Notes Electronic Signature(s) Signed: 04/01/2021 4:30:06 PM By: [name] Entered By: [name] on 04/01/2021 13:08:15 -------------------------------------------------------------------------------- Arrival Information Details Patient Name: Date of Service: Alan Rubio, Alan Rubio 04/01/2021 1:15 PM Medical Record Number: [id number] Patient Account Number: [id number] Date of Birth/Sex: Treating RN: [birth year] (88 y.o. Alan Rubio Primary Care [name]: Alan Rubio Other Clinician: Referring Alan Rubio: Treating Alan Rubio/Extender: Alan Rubio in Treatment: 0 Visit Information Patient Arrived: Alan Rubio Arrival Time: 13:00 Accompanied By: Alan Rubio Transfer Assistance: None Patient Identification Verified: Yes Secondary Verification Process Completed: Yes Patient Requires Transmission-Based Precautions: No Patient Has Alerts: Yes Patient Alerts: L ABI= Non Comp Electronic Signature(s) Signed: 04/01/2021 4:30:06 PM By: [name] Entered By: [name] on 04/01/2021 14:35:39 -------------------------------------------------------------------------------- Clinic Level of Care Assessment Details Patient Name: Date of Service: Alan Rubio, Alan Rubio 04/01/2021 1:15 PM Medical Record Number: [id number] Patient Account Number: [id number] Date of Birth/Sex: Treating RN: 08/22/1931 (88 y.o. Alan Rubio Primary Care Javiana Anwar: Alan Rubio Other  Clinician: Referring Melodi Rubio: Treating [name]/Extender: Alan Rubio in Treatment: 0 Clinic Level of Care Assessment Items TOOL 1 Quantity Score X- 1 0 Use when EandM and Procedure is performed on INITIAL visit ASSESSMENTS - Nursing Assessment / Reassessment X- 1 20 General Physical Exam (combine w/ comprehensive assessment (listed just below) when performed on new pt. evals) X- 1 25 Comprehensive Assessment (HX, ROS, Risk Assessments, Wounds Hx, etc.) ASSESSMENTS - Wound and Skin Assessment / Reassessment '[]'$  - 0 Dermatologic / Skin Assessment (not related to wound area) ASSESSMENTS - Ostomy and/or Continence Assessment and Care '[]'$  - 0 Incontinence Assessment and Management '[]'$  - 0 Ostomy Care Assessment and Management (repouching, etc.) PROCESS - Coordination of Care '[]'$  - 0 Simple Patient / Family Education for ongoing care X- 1 20 Complex (extensive) Patient / Family Education for ongoing care X- 1 10 Staff obtains Programmer, systems, Records, T Results / Process Orders est X- 1 10 Staff telephones HHA, Nursing Homes / Clarify orders / etc '[]'$  - 0 Routine Transfer to another Facility (non-emergent condition) '[]'$  - 0 Routine Hospital Admission (non-emergent condition) '[]'$  - 0 New Admissions / Biomedical engineer / Ordering NPWT Apligraf, etc. , '[]'$  - 0 Emergency Hospital Admission (emergent condition) PROCESS - Special Needs '[]'$  - 0 Pediatric / Minor Patient Management '[]'$  - 0 Isolation Patient Management '[]'$  - 0 Hearing / Language / Visual special needs '[]'$  - 0 Assessment of Community assistance (transportation, D/C planning, etc.) '[]'$  - 0 Additional assistance / Altered mentation '[]'$  - 0 Support Surface(s) Assessment (bed, cushion, seat, etc.) INTERVENTIONS - Miscellaneous '[]'$  - 0 External ear exam '[]'$  - 0 Patient Transfer (multiple staff / Civil Service fast streamer / Similar devices) '[]'$  - 0 Simple Staple / Suture removal (25 or less) '[]'$  - 0 Complex Staple  / Suture removal (26 or more) '[]'$  - 0 Hypo/Hyperglycemic Management (do not check if billed separately) X- 1 15 Ankle / Brachial Index (ABI) - do not check if billed separately Has the patient been seen at the hospital within the last three years: Yes Total Score: 100 Level Of Care:  New/Established - Level 3 Electronic Signature(s) Signed: 04/01/2021 4:30:06 PM By: Fara Chute By: [name] on 04/01/2021 14:29:28 -------------------------------------------------------------------------------- Encounter Discharge Information Details Patient Name: Date of Service: Alan Rubio, Alan Rubio 04/01/2021 1:15 PM Medical Record Number: [id number] Patient Account Number: [id number] Date of Birth/Sex: Treating RN: July 24, 1931 (88 y.o. Alan Rubio Primary Care [name]: Alan Rubio Other Clinician: Referring [name]: Treating Melodi Rubio/Extender: Alan Rubio in Treatment: 0 Encounter Discharge Information Items Post Procedure Vitals Discharge Condition: Stable Temperature (F): 97.7 Ambulatory Status: Cane Pulse (bpm): 72 Discharge Destination: Home Respiratory Rate (breaths/min): 18 Transportation: Private Auto Blood Pressure (mmHg): 160/71 Accompanied By: Alan Rubio Schedule Follow-up Appointment: Yes Clinical Summary of Care: Provided on 04/01/2021 Form Type Recipient Paper Patient Patient Electronic Signature(s) Signed: 04/01/2021 4:30:06 PM By: [name] Entered By: [name] on 04/01/2021 14:31:09 -------------------------------------------------------------------------------- Lower Extremity Assessment Details Patient Name: Date of Service: Alan Rubio, Alan Rubio 04/01/2021 1:15 PM Medical Record Number: [id number] Patient Account Number: [id number] Date of Birth/Sex: Treating RN: 01/11/1932 (88 y.o. Alan Rubio Primary Care [name]: Alan Rubio Other Clinician: Referring [name]: Treating [name]/Extender: Alan Rubio in Treatment: 0 Edema Assessment Assessed: [Left: Yes] [Right: No] Edema: [Left: N] [Right: o] Calf Left: Right: Point of Measurement: 34 cm From Medial Instep 34.3 cm Ankle Left: Right: Point of Measurement: 10 cm From Medial Instep 23 cm Knee To Floor Left: Right: From Medial Instep 46 cm Vascular Assessment Pulses: Dorsalis Pedis Palpable: [Left:Yes] Notes L ABI=Non Compressible Electronic Signature(s) Signed: 04/01/2021 4:30:06 PM By: [name] Entered By: [name] on 04/01/2021 13:34:34 -------------------------------------------------------------------------------- Multi-Disciplinary Care Plan Details Patient Name: Date of Service: Alan Rubio, Alan Rubio 04/01/2021 1:15 PM Medical Record Number: [id number] Patient Account Number: [id number] Date of Birth/Sex: Treating RN: [birth year] (88 y.o. Alan Rubio Primary Care Osha Errico: Alan Rubio Other Clinician: Referring Zaylen Susman: Treating Meila Berke/Extender: Alan Rubio in Treatment: 0 Active Inactive Wound/Skin Impairment Nursing Diagnoses: Impaired tissue integrity Goals: Patient/caregiver will verbalize understanding of skin care regimen Date Initiated: 04/01/2021 Target Resolution Date: 05/06/2021 Goal Status: Active Ulcer/skin breakdown will have a volume reduction of 30% by week 4 Date Initiated: 04/01/2021 Target Resolution Date: 05/06/2021 Goal Status: Active Interventions: Assess patient/caregiver ability to obtain necessary supplies Assess patient/caregiver ability to perform ulcer/skin care regimen upon admission and as needed Assess ulceration(s) every visit Provide education on ulcer and skin care Treatment Activities: Topical wound management initiated : 04/01/2021 Notes: Electronic Signature(s) Signed: 04/01/2021 4:30:06 PM By: [name] Entered By: [name] on 04/01/2021  13:27:50 -------------------------------------------------------------------------------- Pain Assessment Details Patient Name: Date of Service: Alan Rubio, Alan Rubio 04/01/2021 1:15 PM Medical Record Number: [id number] Patient Account Number: [id number] Date of Birth/Sex: Treating RN: May 09, 1931 (88 y.o. Alan Rubio Primary Care Cricket Goodlin: Alan Rubio Other Clinician: Referring Trentyn Boisclair: Treating Lundon Verdejo/Extender: Alan Rubio in Treatment: 0 Active Problems Location of Pain Severity and Description of Pain Patient Has Paino No Patient Has Paino No Site Locations Pain Management and Medication Current Pain Management: Electronic Signature(s) Signed: 04/01/2021 4:30:06 PM By: [name] Entered By: [name] on 04/01/2021 13:19:08 -------------------------------------------------------------------------------- Patient/Caregiver Education Details Patient Name: Date of Service: Alan Rubio, Alan Rubio 3/8/2023andnbsp1:15 PM Medical Record Number: [id number] Patient Account Number: [id number] Date of Birth/Gender: Treating RN: Apr 23, 1931 (88 y.o. Alan Rubio Primary Care Physician: Alan Rubio Other Clinician: Referring Physician: Treating Physician/Extender: Alan Rubio in Treatment: 0 Education Assessment Education Provided To: Patient and Caregiver Education Topics Provided  Malignant/Atypical Wounds: Methods: Explain/Verbal Responses: State content correctly Wound/Skin Impairment: Methods: Demonstration, Explain/Verbal, Printed Responses: State content correctly Electronic Signature(s) Signed: 04/01/2021 4:30:06 PM By: [name] Entered By: [name] on 04/01/2021 14:15:44 -------------------------------------------------------------------------------- Wound Assessment Details Patient Name: Date of Service: Alan Rubio, Alan Rubio 04/01/2021 1:15 PM Medical Record Number: [id number] Patient Account Number:  [id number] Date of Birth/Sex: Treating RN: 01-Oct-1931 (88 y.o. Alan Rubio Primary Care Chrishonda Hesch: Alan Rubio Other Clinician: Referring Divante Kotch: Treating Shea Swalley/Extender: Alan Rubio in Treatment: 0 Wound Status Wound Number: 1 Primary Atypical Etiology: Wound Location: Left, Posterior Lower Leg Wound Open Wounding Event: Gradually Appeared Status: Date Acquired: 05/05/2020 Comorbid Cataracts, Anemia, Arrhythmia, Hypertension, Type II Weeks Of Treatment: 0 History: Diabetes, Received Radiation Clustered Wound: No Photos Wound Measurements Length: (cm) 1.5 Width: (cm) 1.6 Depth: (cm) 0.1 Area: (cm) 1.885 Volume: (cm) 0.188 % Reduction in Area: 0% % Reduction in Volume: 0% Epithelialization: None Tunneling: No Undermining: No Wound Description Classification: Full Thickness Without Exposed Support Structures Wound Margin: Distinct, outline attached Exudate Amount: Medium Exudate Type: Sanguinous Exudate Color: red Foul Odor After Cleansing: No Slough/Fibrino No Wound Bed Granulation Amount: Large (67-100%) Exposed Structure Granulation Quality: Red, Hyper-granulation, Friable Fascia Exposed: No Necrotic Amount: None Present (0%) Fat Layer (Subcutaneous Tissue) Exposed: Yes Tendon Exposed: No Muscle Exposed: No Joint Exposed: No Bone Exposed: No Treatment Notes Wound #1 (Lower Leg) Wound Laterality: Left, Posterior Cleanser Soap and Water Discharge Instruction: May shower and wash wound with dial antibacterial soap and water prior to dressing change. Wound Cleanser Discharge Instruction: Cleanse the wound with wound cleanser prior to applying a clean dressing using gauze sponges, not tissue or cotton balls. Peri-Wound Care Skin Prep Discharge Instruction: Use skin prep as directed Topical Primary Dressing Hydrofera Blue Ready Foam, 2.5 x2.5 in Discharge Instruction: Apply to wound bed as instructed Secondary  Dressing Zetuvit Plus Silicone Border Dressing 4x4 (in/in) Discharge Instruction: Apply silicone border over primary dressing as directed. Secured With Compression Wrap Compression Stockings Environmental education officer) Signed: 04/01/2021 4:30:06 PM By: [name] Entered By: [name] on 04/01/2021 13:47:52 -------------------------------------------------------------------------------- Vitals Details Patient Name: Date of Service: Alan Rubio, Alan Rubio 04/01/2021 1:15 PM Medical Record Number: [id number] Patient Account Number: [id number] Date of Birth/Sex: Treating RN: 1931/06/21 (88 y.o. Alan Rubio Primary Care Caleah Tortorelli: Alan Rubio Other Clinician: Referring Maevyn Riordan: Treating Goran Olden/Extender: Alan Rubio in Treatment: 0 Vital Signs Time Taken: 13:06 Temperature (F): 97.7 Height (in): 71 Pulse (bpm): 72 Source: Stated Respiratory Rate (breaths/min): 18 Weight (lbs): 175 Blood Pressure (mmHg): 160/71 Source: Stated Capillary Blood Glucose (mg/dl): 130 Body Mass Index (BMI): 24.4 Reference Range: 80 - 120 mg / dl Electronic Signature(s) Signed: 04/01/2021 4:30:06 PM By: [name] Entered By: [name] on 04/01/2021 13:07:25

## 2021-04-01 NOTE — Progress Notes (Signed)
DEVOL, [name]) ?Visit Report for 04/01/2021 ?Abuse Risk Screen Details ?Patient Name: Date of Service: ?Alan Rubio, Alan Rubio 04/01/2021 1:15 PM ?Medical Record Number: [id number] ?Patient Account Number: [id number] ?Date of Birth/Sex: Treating RN: ?03-16-1931 (88 y.o. Alan Rubio ?Primary Care Alan Rubio: Alan Rubio Other Clinician: ?Referring Alan Rubio: ?Treating Alan Rubio/Extender: Alan Rubio ?Alan Rubio ?Alan Rubio: 0 ?Abuse Risk Screen Items ?Answer ?ABUSE RISK SCREEN: ?Has anyone close to you tried to hurt or harm you recentlyo No ?Do you feel uncomfortable with anyone in your familyo No ?Has anyone forced you do things that you didnt want to doo No ?Electronic Signature(s) ?Signed: 04/01/2021 4:30:06 PM Rubio: Alan Rubio ?Alan Rubio: Alan Rubio on 04/01/2021 13:16:43 ?-------------------------------------------------------------------------------- ?Activities of Daily Living Details ?Patient Name: Date of Service: ?Alan Rubio, Alan Rubio 04/01/2021 1:15 PM ?Medical Record Number: [id number] ?Patient Account Number: [id number] ?Date of Birth/Sex: Treating RN: ?09/23/1931 (88 y.o. Alan Rubio ?Primary Care Alan Rubio: Alan Rubio Other Clinician: ?Referring Alan Rubio: ?Treating Alan Rubio/Extender: Alan Rubio ?Alan Rubio ?Alan Rubio: 0 ?Activities of Daily Living Items ?Answer ?Activities of Daily Living (Please select one for each item) ?Drive Automobile Not Able ?T Medications ?ake Completely Able ?Use T elephone Completely Able ?Care for Appearance Completely Able ?Use T oilet Completely Able ?Bath / Shower Completely Able ?Dress Self Completely Able ?Feed Self Completely Able ?Walk Need Assistance ?Get In / Out Bed Completely Able ?Housework Need Assistance ?Prepare Meals Need Assistance ?Handle Money Completely Able ?Shop for Self Need Assistance ?Electronic Signature(s) ?Signed: 04/01/2021 4:30:06 PM Rubio: Alan Rubio ?Alan Rubio: Alan Rubio on 04/01/2021  13:17:37 ?-------------------------------------------------------------------------------- ?Education Screening Details ?Patient Name: ?Date of Service: ?Alan Rubio, Alan Rubio 04/01/2021 1:15 PM ?Medical Record Number: [id number] ?Patient Account Number: [id number] ?Date of Birth/Sex: ?Treating RN: ?[birth year] (88 y.o. Alan Rubio ?Primary Care Tocara Mennen: Alan Rubio ?Other Clinician: ?Referring Alan Rubio: ?Treating Alan Rubio/Extender: Alan Rubio ?Alan Rubio ?Alan Rubio: 0 ?Primary Learner Assessed: Patient ?Learning Preferences/Education Level/Primary Language ?Learning Preference: Explanation, Demonstration, Printed Material ?Highest Education Level: High School ?Preferred Language: English ?Cognitive Barrier ?Language Barrier: No ?Translator Needed: No ?Memory Deficit: No ?Emotional Barrier: No ?Cultural/Religious Beliefs Affecting Medical Care: No ?Physical Barrier ?Impaired Vision: No ?Impaired Hearing: No ?Decreased Hand dexterity: No ?Knowledge/Comprehension ?Knowledge Level: Medium ?Comprehension Level: High ?Ability to understand written instructions: High ?Ability to understand verbal instructions: High ?Motivation ?Anxiety Level: Calm ?Cooperation: Cooperative ?Education Importance: Acknowledges Need ?Interest in Health Problems: Asks Questions ?Perception: Coherent ?Willingness to Engage in Self-Management High ?Activities: ?Readiness to Engage in Self-Management High ?Activities: ?Electronic Signature(s) ?Signed: 04/01/2021 4:30:06 PM Rubio: Alan Rubio ?Alan Rubio: Alan Rubio on 04/01/2021 13:18:38 ?-------------------------------------------------------------------------------- ?Fall Risk Assessment Details ?Patient Name: ?Date of Service: ?Alan Rubio, Alan Rubio 04/01/2021 1:15 PM ?Medical Record Number: [id number] ?Patient Account Number: [id number] ?Date of Birth/Sex: ?Treating RN: ?06-12-1931 (88 y.o. Alan Rubio ?Primary Care Alan Rubio: Alan Rubio ?Other Clinician: ?Referring  Alan Rubio: ?Treating Alan Rubio/Extender: Alan Rubio ?Alan Rubio ?Alan Rubio: 0 ?Fall Risk Assessment Items ?Have you had 2 or more falls in the last 12 monthso 0 No ?Have you had any fall that resulted in injury in the last 12 monthso 0 No ?FALLS RISK SCREEN ?History of falling - immediate or within 3 months 0 No ?Secondary diagnosis (Do you have 2 or more medical diagnoseso) 15 Yes ?Ambulatory aid ?None/bed rest/wheelchair/nurse 0 No ?Crutches/cane/walker 15 Yes ?Furniture 0 No ?Intravenous therapy Access/Saline/Heparin Lock 0 No ?Gait/Transferring ?Normal/ bed rest/ wheelchair 0 Yes ?Weak (short steps with or without shuffle, stooped but able to  lift head while walking, may seek 0 No ?support from furniture) ?Impaired (short steps with shuffle, may have difficulty arising from chair, head down, impaired 0 No ?balance) ?Mental Status ?Oriented to own ability 0 Yes ?Electronic Signature(s) ?Signed: 04/01/2021 4:30:06 PM Rubio: Alan Rubio ?Alan Rubio: Alan Rubio on 04/01/2021 13:18:03 ?-------------------------------------------------------------------------------- ?Foot Assessment Details ?Patient Name: ?Date of Service: ?Alan Rubio, Alan Rubio 04/01/2021 1:15 PM ?Medical Record Number: [id number] ?Patient Account Number: [id number] ?Date of Birth/Sex: ?Treating RN: ?August 06, 1931 (88 y.o. Alan Rubio ?Primary Care Alan Rubio: Alan Rubio ?Other Clinician: ?Referring Alan Rubio: ?Treating Alan Rubio/Extender: Alan Rubio ?Alan Rubio ?Alan Rubio: 0 ?Foot Assessment Items ?Site Locations ?+ = Sensation present, - = Sensation absent, C = Callus, U = Ulcer ?R = Redness, W = Warmth, M = Maceration, PU = Pre-ulcerative lesion ?F = Fissure, S = Swelling, D = Dryness ?Assessment ?Right: Left: ?Other Deformity: No No ?Prior Foot Ulcer: No No ?Prior Amputation: No No ?Charcot Joint: No No ?Ambulatory Status: Ambulatory With Help ?Assistance Device: Cane ?Gait: Steady ?Electronic Signature(s) ?Signed:  04/01/2021 4:30:06 PM Rubio: Alan Rubio ?Alan Rubio: Alan Rubio on 04/01/2021 13:22:45 ?-------------------------------------------------------------------------------- ?Nutrition Risk Screening Details ?Patient Name: ?Date of Service: ?Alan Rubio, Alan Rubio 04/01/2021 1:15 PM ?Medical Record Number: [id number] ?Patient Account Number: [id number] ?Date of Birth/Sex: ?Treating RN: ?1931/02/17 (89 y.o. Alan Rubio ?Primary Care Dekari Bures: Alan Rubio ?Other Clinician: ?Referring Wylene Weissman: ?Treating Rodrecus Belsky/Extender: Alan Rubio ?Alan Rubio ?Alan Rubio: 0 ?Height (in): 71 ?Weight (lbs): 175 ?Body Mass Index (BMI): 24.4 ?Nutrition Risk Screening Items ?Score Screening ?NUTRITION RISK SCREEN: ?I have an illness or condition that made me change the kind and/or amount of food I eat 0 No ?I eat fewer than two meals per day 0 No ?I eat few fruits and vegetables, or milk products 0 No ?I have three or more drinks of beer, liquor or wine almost every day 0 No ?I have tooth or mouth problems that make it hard for me to eat 0 No ?I don't always have enough money to buy the food I need 0 No ?I eat alone most of the time 0 No ?I take three or more different prescribed or over-the-counter drugs a day 1 Yes ?Without wanting to, I have lost or gained 10 pounds in the last six months 0 No ?I am not always physically able to shop, cook and/or feed myself 0 No ?Nutrition Protocols ?Good Risk Protocol 0 No interventions needed ?Moderate Risk Protocol ?High Risk Proctocol ?Risk Level: Good Risk ?Score: 1 ?Electronic Signature(s) ?Signed: 04/01/2021 4:30:06 PM Rubio: Alan Rubio ?Alan Rubio: Alan Rubio on 04/01/2021 13:18:51 ?

## 2021-04-03 DIAGNOSIS — L97429 Non-pressure chronic ulcer of left heel and midfoot with unspecified severity: Secondary | ICD-10-CM | POA: Diagnosis not present

## 2021-04-03 DIAGNOSIS — I2510 Atherosclerotic heart disease of native coronary artery without angina pectoris: Secondary | ICD-10-CM | POA: Diagnosis not present

## 2021-04-03 DIAGNOSIS — E1142 Type 2 diabetes mellitus with diabetic polyneuropathy: Secondary | ICD-10-CM | POA: Diagnosis not present

## 2021-04-03 DIAGNOSIS — D2372 Other benign neoplasm of skin of left lower limb, including hip: Secondary | ICD-10-CM | POA: Diagnosis not present

## 2021-04-03 DIAGNOSIS — E039 Hypothyroidism, unspecified: Secondary | ICD-10-CM | POA: Diagnosis not present

## 2021-04-03 DIAGNOSIS — M109 Gout, unspecified: Secondary | ICD-10-CM | POA: Diagnosis not present

## 2021-04-06 DIAGNOSIS — I2510 Atherosclerotic heart disease of native coronary artery without angina pectoris: Secondary | ICD-10-CM | POA: Diagnosis not present

## 2021-04-06 DIAGNOSIS — E1142 Type 2 diabetes mellitus with diabetic polyneuropathy: Secondary | ICD-10-CM | POA: Diagnosis not present

## 2021-04-06 DIAGNOSIS — E039 Hypothyroidism, unspecified: Secondary | ICD-10-CM | POA: Diagnosis not present

## 2021-04-06 DIAGNOSIS — L97429 Non-pressure chronic ulcer of left heel and midfoot with unspecified severity: Secondary | ICD-10-CM | POA: Diagnosis not present

## 2021-04-06 DIAGNOSIS — M109 Gout, unspecified: Secondary | ICD-10-CM | POA: Diagnosis not present

## 2021-04-06 DIAGNOSIS — D2372 Other benign neoplasm of skin of left lower limb, including hip: Secondary | ICD-10-CM | POA: Diagnosis not present

## 2021-04-09 DIAGNOSIS — I2510 Atherosclerotic heart disease of native coronary artery without angina pectoris: Secondary | ICD-10-CM | POA: Diagnosis not present

## 2021-04-09 DIAGNOSIS — E1142 Type 2 diabetes mellitus with diabetic polyneuropathy: Secondary | ICD-10-CM | POA: Diagnosis not present

## 2021-04-09 DIAGNOSIS — L97429 Non-pressure chronic ulcer of left heel and midfoot with unspecified severity: Secondary | ICD-10-CM | POA: Diagnosis not present

## 2021-04-09 DIAGNOSIS — E039 Hypothyroidism, unspecified: Secondary | ICD-10-CM | POA: Diagnosis not present

## 2021-04-09 DIAGNOSIS — M109 Gout, unspecified: Secondary | ICD-10-CM | POA: Diagnosis not present

## 2021-04-09 DIAGNOSIS — D2372 Other benign neoplasm of skin of left lower limb, including hip: Secondary | ICD-10-CM | POA: Diagnosis not present

## 2021-04-13 DIAGNOSIS — I2510 Atherosclerotic heart disease of native coronary artery without angina pectoris: Secondary | ICD-10-CM | POA: Diagnosis not present

## 2021-04-13 DIAGNOSIS — E1142 Type 2 diabetes mellitus with diabetic polyneuropathy: Secondary | ICD-10-CM | POA: Diagnosis not present

## 2021-04-13 DIAGNOSIS — L97429 Non-pressure chronic ulcer of left heel and midfoot with unspecified severity: Secondary | ICD-10-CM | POA: Diagnosis not present

## 2021-04-13 DIAGNOSIS — M109 Gout, unspecified: Secondary | ICD-10-CM | POA: Diagnosis not present

## 2021-04-13 DIAGNOSIS — D2372 Other benign neoplasm of skin of left lower limb, including hip: Secondary | ICD-10-CM | POA: Diagnosis not present

## 2021-04-13 DIAGNOSIS — E039 Hypothyroidism, unspecified: Secondary | ICD-10-CM | POA: Diagnosis not present

## 2021-04-15 ENCOUNTER — Other Ambulatory Visit: Payer: Self-pay

## 2021-04-15 ENCOUNTER — Encounter (HOSPITAL_BASED_OUTPATIENT_CLINIC_OR_DEPARTMENT_OTHER): Payer: Medicare Other | Admitting: Physician Assistant

## 2021-04-15 DIAGNOSIS — E11622 Type 2 diabetes mellitus with other skin ulcer: Secondary | ICD-10-CM | POA: Diagnosis not present

## 2021-04-15 DIAGNOSIS — C44729 Squamous cell carcinoma of skin of left lower limb, including hip: Secondary | ICD-10-CM | POA: Diagnosis not present

## 2021-04-15 DIAGNOSIS — L0889 Other specified local infections of the skin and subcutaneous tissue: Secondary | ICD-10-CM | POA: Diagnosis not present

## 2021-04-15 DIAGNOSIS — L97822 Non-pressure chronic ulcer of other part of left lower leg with fat layer exposed: Secondary | ICD-10-CM | POA: Diagnosis not present

## 2021-04-15 DIAGNOSIS — L97222 Non-pressure chronic ulcer of left calf with fat layer exposed: Secondary | ICD-10-CM | POA: Diagnosis not present

## 2021-04-15 DIAGNOSIS — L988 Other specified disorders of the skin and subcutaneous tissue: Secondary | ICD-10-CM | POA: Diagnosis not present

## 2021-04-15 DIAGNOSIS — I10 Essential (primary) hypertension: Secondary | ICD-10-CM | POA: Diagnosis not present

## 2021-04-15 NOTE — Progress Notes (Signed)
KUNZLER, Emad ([id number]) ?Visit Report for 04/15/2021 ?Arrival Information Details ?Patient Name: Date of Service: ?Alan Rubio, Alan Rubio 04/15/2021 1:00 PM ?Medical Record Number: [id number] ?Patient Account Number: [id number] ?Date of Birth/Sex: Treating RN: ?1932/01/12 (88 y.o. [name] ?Primary Care Kolson Chovanec: [name] Other Clinician: ?Referring Detrell Umscheid: ?Treating Khaliq Turay/Extender: Worthy Keeler ?[name] ?Weeks in Treatment: 2 ?Visit Information History Since Last Visit ?Added or deleted any medications: No ?Patient Arrived: Ambulatory ?Any new allergies or adverse reactions: No ?Arrival Time: 13:10 ?Had a fall or experienced change in No ?Accompanied By: Step Son ?activities of daily living that may affect ?Transfer Assistance: None ?risk of falls: ?Patient Identification Verified: Yes ?Signs or symptoms of abuse/neglect since last visito No ?Secondary Verification Process Completed: Yes ?Hospitalized since last visit: No ?Patient Requires Transmission-Based Precautions: No ?Implantable device outside of the clinic excluding No ?Patient Has Alerts: Yes ?cellular tissue based products placed in the center ?Patient Alerts: L ABI= Non Comp since last visit: ?Has Dressing in Place as Prescribed: Yes ?Pain Present Now: No ?Electronic Signature(s) ?Signed: 04/15/2021 5:15:11 PM By: [name] ?Entered By: [name] on 04/15/2021 13:14:36 ?-------------------------------------------------------------------------------- ?Clinic Level of Care Assessment Details ?Patient Name: Date of Service: ?Alan Rubio, Alan Rubio 04/15/2021 1:00 PM ?Medical Record Number: [id number] ?Patient Account Number: [id number] ?Date of Birth/Sex: Treating RN: ?[birth year] (88 y.o. M) [name], [name] ?Primary Care Chantrell Apsey: [name] Other Clinician: ?Referring Hannah Strader: ?Treating Ireta Pullman/Extender: Worthy Keeler ?[name] ?Weeks in Treatment: 2 ?Clinic Level of Care Assessment Items ?TOOL 4 Quantity Score ?X- 1 0 ?Use when  only an EandM is performed on FOLLOW-UP visit ?ASSESSMENTS - Nursing Assessment / Reassessment ?X- 1 10 ?Reassessment of Co-morbidities (includes updates in patient status) ?X- 1 5 ?Reassessment of Adherence to Treatment Plan ?ASSESSMENTS - Wound and Skin A ssessment / Reassessment ?X - Simple Wound Assessment / Reassessment - one wound 1 5 ?'[]'$  - 0 ?Complex Wound Assessment / Reassessment - multiple wounds ?X- 1 10 ?Dermatologic / Skin Assessment (not related to wound area) ?ASSESSMENTS - Focused Assessment ?X- 1 5 ?Circumferential Edema Measurements - multi extremities ?'[]'$  - 0 ?Nutritional Assessment / Counseling / Intervention ?'[]'$  - 0 ?Lower Extremity Assessment (monofilament, tuning fork, pulses) ?'[]'$  - 0 ?Peripheral Arterial Disease Assessment (using hand held doppler) ?ASSESSMENTS - Ostomy and/or Continence Assessment and Care ?'[]'$  - 0 ?Incontinence Assessment and Management ?'[]'$  - 0 ?Ostomy Care Assessment and Management (repouching, etc.) ?PROCESS - Coordination of Care ?X - Simple Patient / Family Education for ongoing care 1 15 ?'[]'$  - 0 ?Complex (extensive) Patient / Family Education for ongoing care ?X- 1 10 ?Staff obtains Consents, Records, T Results / Process Orders ?est ?'[]'$  - 0 ?Staff telephones HHA, Nursing Homes / Clarify orders / etc ?'[]'$  - 0 ?Routine Transfer to another Facility (non-emergent condition) ?'[]'$  - 0 ?Routine Hospital Admission (non-emergent condition) ?'[]'$  - 0 ?New Admissions / Biomedical engineer / Ordering NPWT Apligraf, etc. ?, ?'[]'$  - 0 ?Emergency Hospital Admission (emergent condition) ?X- 1 10 ?Simple Discharge Coordination ?'[]'$  - 0 ?Complex (extensive) Discharge Coordination ?PROCESS - Special Needs ?'[]'$  - 0 ?Pediatric / Minor Patient Management ?'[]'$  - 0 ?Isolation Patient Management ?'[]'$  - 0 ?Hearing / Language / Visual special needs ?'[]'$  - 0 ?Assessment of Community assistance (transportation, D/C planning, etc.) ?'[]'$  - 0 ?Additional assistance / Altered mentation ?'[]'$  - 0 ?Support  Surface(s) Assessment (bed, cushion, seat, etc.) ?INTERVENTIONS - Wound Cleansing / Measurement ?X - Simple Wound Cleansing - one wound 1 5 ?'[]'$  - 0 ?Complex Wound  Cleansing - multiple wounds ?X- 1 5 ?Wound Imaging (photographs - any number of wounds) ?'[]'$  - 0 ?Wound Tracing (instead of photographs) ?X- 1 5 ?Simple Wound Measurement - one wound ?'[]'$  - 0 ?Complex Wound Measurement - multiple wounds ?INTERVENTIONS - Wound Dressings ?X - Small Wound Dressing one or multiple wounds 1 10 ?'[]'$  - 0 ?Medium Wound Dressing one or multiple wounds ?'[]'$  - 0 ?Large Wound Dressing one or multiple wounds ?'[]'$  - 0 ?Application of Medications - topical ?'[]'$  - 0 ?Application of Medications - injection ?INTERVENTIONS - Miscellaneous ?'[]'$  - 0 ?External ear exam ?'[]'$  - 0 ?Specimen Collection (cultures, biopsies, blood, body fluids, etc.) ?'[]'$  - 0 ?Specimen(s) / Culture(s) sent or taken to Lab for analysis ?'[]'$  - 0 ?Patient Transfer (multiple staff / Civil Service fast streamer / Similar devices) ?'[]'$  - 0 ?Simple Staple / Suture removal (25 or less) ?'[]'$  - 0 ?Complex Staple / Suture removal (26 or more) ?'[]'$  - 0 ?Hypo / Hyperglycemic Management (close monitor of Blood Glucose) ?'[]'$  - 0 ?Ankle / Brachial Index (ABI) - do not check if billed separately ?X- 1 5 ?Vital Signs ?Has the patient been seen at the hospital within the last three years: Yes ?Total Score: 100 ?Level Of Care: New/Established - Level 3 ?Electronic Signature(s) ?Signed: 04/15/2021 5:40:22 PM By: Deon Pilling RN, BSN ?Entered By: Deon Pilling on 04/15/2021 13:32:37 ?-------------------------------------------------------------------------------- ?Encounter Discharge Information Details ?Patient Name: Date of Service: ?Alan Rubio, Alan Rubio 04/15/2021 1:00 PM ?Medical Record Number: [id number] ?Patient Account Number: [id number] ?Date of Birth/Sex: Treating RN: ?08-19-1931 (88 y.o. M) [name], [name] ?Primary Care Jodine Muchmore: [name] Other Clinician: ?Referring Jahmeek Shirk: ?Treating Anjannette Gauger/Extender: Worthy Keeler ?[name] ?Weeks in Treatment: 2 ?Encounter Discharge Information Items ?Discharge Condition: Stable ?Ambulatory Status: Ambulatory ?Discharge Destination: Home ?Transportation: Private Auto ?Accompanied By: Pearson Grippe ?Schedule Follow-up Appointment: Yes ?Clinical Summary of Care: ?Electronic Signature(s) ?Signed: 04/15/2021 5:40:22 PM By: Deon Pilling RN, BSN ?Entered By: Deon Pilling on 04/15/2021 13:33:26 ?-------------------------------------------------------------------------------- ?Lower Extremity Assessment Details ?Patient Name: Date of Service: ?Alan Rubio, Alan Rubio 04/15/2021 1:00 PM ?Medical Record Number: [id number] ?Patient Account Number: [id number] ?Date of Birth/Sex: Treating RN: ?[birth year] (88 y.o. [name] ?Primary Care Keone Kamer: [name] Other Clinician: ?Referring Addi Pak: ?Treating Aleya Durnell/Extender: Worthy Keeler ?[name] ?Weeks in Treatment: 2 ?Edema Assessment ?Assessed: [Left: Yes] [Right: No] ?Edema: [Left: N] [Right: o] ?Calf ?Left: Right: ?Point of Measurement: 34 cm From Medial Instep 34 cm ?Ankle ?Left: Right: ?Point of Measurement: 10 cm From Medial Instep 23 cm ?Vascular Assessment ?Pulses: ?Dorsalis Pedis ?Palpable: [Left:Yes] ?Electronic Signature(s) ?Signed: 04/15/2021 5:15:11 PM By: [name] ?Entered By: [name] on 04/15/2021 13:19:37 ?-------------------------------------------------------------------------------- ?Multi-Disciplinary Care Plan Details ?Patient Name: ?Date of Service: ?Alan Rubio, Alan Rubio 04/15/2021 1:00 PM ?Medical Record Number: [id number] ?Patient Account Number: [id number] ?Date of Birth/Sex: ?Treating RN: ?[birth year] (88 y.o. [name] ?Primary Care Yasmyn Bellisario: [name] ?Other Clinician: ?Referring Tannor Pyon: ?Treating Oddis Westling/Extender: Worthy Keeler ?[name] ?Weeks in Treatment: 2 ?Active Inactive ?Wound/Skin Impairment ?Nursing Diagnoses: ?Impaired tissue integrity ?Goals: ?Patient/caregiver will verbalize  understanding of skin care regimen ?Date Initiated: 04/01/2021 ?Target Resolution Date: 05/06/2021 ?Goal Status: Active ?Ulcer/skin breakdown will have a volume reduction of 30% by week 4 ?Date Initiated: 3/8/202

## 2021-04-15 NOTE — Progress Notes (Addendum)
FITZHENRY, Square ([id number]) ?Visit Report for 04/15/2021 ?Chief Complaint Document Details ?Patient Name: Date of Service: ?Alan Rubio, Alan Rubio 04/15/2021 1:00 PM ?Medical Record Number: [id number] ?Patient Account Number: [id number] ?Date of Birth/Sex: Treating RN: ?[birth year] (88 y.o. Alan Rubio ?Primary Care Provider: [name] Other Clinician: ?Referring Provider: ?Treating Provider/Extender: Worthy Keeler ?[name] ?Weeks in Treatment: 2 ?Information Obtained from: Patient ?Chief Complaint ?Left LE Ulcer ?Electronic Signature(s) ?Signed: 04/15/2021 1:15:56 PM By: Worthy Keeler PA-C ?Entered By: Worthy Keeler on 04/15/2021 13:15:56 ?-------------------------------------------------------------------------------- ?HPI Details ?Patient Name: Date of Service: ?Alan Rubio, Alan Rubio 04/15/2021 1:00 PM ?Medical Record Number: [id number] ?Patient Account Number: [id number] ?Date of Birth/Sex: Treating RN: ?January 25, 1932 (88 y.o. Alan Rubio ?Primary Care Provider: [name] Other Clinician: ?Referring Provider: ?Treating Provider/Extender: Worthy Keeler ?[name] ?Weeks in Treatment: 2 ?History of Present Illness ?HPI Description: 04/01/2021 upon evaluation today patient presents for initial inspection here in the clinic concerning issues that has been having with a wound ?on the left lateral/posterior leg which has been present he tells me for at least a year. He is not exactly sure how this, but seem to just be gradual appearing. ?With that being said he was seen by his primary care provider for a time they were not able to get this healed. He also has home health coming out they have ?been trying to help out as well. Unfortunately this just does not seem to be healing as quickly as he would like to see in fact he is really not seeing much in the ?way of healing at all. There is definitely overgrowth that is almost mushroom doubt this really has the appearance in my opinion of likely being a skin  cancer. ?Possibly squamous cell carcinoma. With that being said I do think that a biopsy for confirmation would be warranted in this case. That was discussed with the ?patient today as well. ?With that being said the patient does have a history of diabetes mellitus type 2, hypertension, and again that he has been using some medications on this ?home health has been performing the medication and dressing changes for him currently. He does not have any history of skin cancer that he is aware of. He ?does have an ABI that was performed May 16, 2020 and showed to be 1.10 on this left side. His most recent hemoglobin A1c was 8.4. ?04/15/2021 upon evaluation today patient presents for follow-up concerning his wound on the left posterior lower leg. 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 That was 2 weeks ago. We do have the result back finally which is great news. With that being said it did show that there ?was evidence of atypical cells in both the wide and deep margins. I do believe that he is can require referral to a Mohs surgeon for evaluation and management ?of this condition. ?Electronic Signature(s) ?Signed: 04/15/2021 1:35:41 PM By: Worthy Keeler PA-C ?Entered By: Worthy Keeler on 04/15/2021 13:35:40 ?-------------------------------------------------------------------------------- ?Physical Exam Details ?Patient Name: Date of Service: ?Alan Rubio, Alan Rubio 04/15/2021 1:00 PM ?Medical Record Number: [id number] ?Patient Account Number: [id number] ?Date of Birth/Sex: Treating RN: ?[birth year] (88 y.o. Alan Rubio ?Primary Care Provider: [name] Other Clinician: ?Referring Provider: ?Treating Provider/Extender: Worthy Keeler ?[name] ?Weeks in Treatment: 2 ?Constitutional ?Well-nourished and well-hydrated in no acute distress. ?Respiratory ?normal breathing without difficulty. ?Psychiatric ?this patient is able to make decisions and demonstrates good insight into  disease process. Alert and Oriented  x 3. pleasant and cooperative. ?Notes ?Upon inspection patient's wound bed actually showed signs actually doing better it is looking better and measuring smaller. With that being said the cells are ?definitely atypical here showing a squamous cell carcinoma and for that reason I really do believe that he needs to have a follow-up appointment with the Mohs ?surgeon ASAP to get this taken care of. Patient's family member was here as well and they are in agreement with making this referral. ?Electronic Signature(s) ?Signed: 04/15/2021 1:38:04 PM By: Worthy Keeler PA-C ?Previous Signature: 04/15/2021 1:36:10 PM Version By: Worthy Keeler PA-C ?Entered By: Worthy Keeler on 04/15/2021 13:38:04 ?-------------------------------------------------------------------------------- ?Physician Orders Details ?Patient Name: Date of Service: ?Alan Rubio, Alan Rubio 04/15/2021 1:00 PM ?Medical Record Number: [id number] ?Patient Account Number: [id number] ?Date of Birth/Sex: Treating RN: ?December 17, 1931 (88 y.o. 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 - with Alyson Reedy and [facility], Room 8 05/06/2021 ?Follow up with wound center to closely monitor till appointment with [facility]. ?Other: - Someone will call you from [facility] to schedule an appointment. ***If you have NOT heard from them by beginning of next week ?please call them at [phone] to ensure they have your referral.  Call wound center if [facility] does not call or have referral for you. ?Bathing/ Shower/ Hygiene ?May shower and wash wound with soap and water. - May shower when changing dressing. ?Additional Orders / Instructions ?Follow Nutritious Diet ?Home Health ?New wound care orders this week; continue Home Health for wound care. May utilize formulary equivalent dressing for wound treatment ?orders unless otherwise specified. - Skilled nursing for dressing changes 2x week ?Other Home Health Orders/Instructions: [facility] ?Wound Treatment ?Wound #1 - Lower Leg Wound Laterality: Left, Posterior ?Cleanser: Soap and Water (Home Health) 2 x Per Week/30 Days ?Discharge Instructions: May shower and wash wound with dial antibacterial soap and water prior to dressing change. ?Cleanser: Wound Cleanser (Home Health) 2 x Per Week/30 Days ?Discharge Instructions: Cleanse the wound with wound cleanser prior to applying a clean dressing using gauze sponges, not tissue or cotton balls. ?Peri-Wound Care: Skin Prep (Home Health) 2 x Per Week/30 Days ?Discharge Instructions: Use skin prep as directed ?Prim Dressing: Hydrofera Blue Ready Foam, 2.5 x2.5 in (Home Health) 2 x Per Week/30 Days ?ary ?Discharge Instructions: Apply to wound bed as instructed ?Secondary Dressing: Zetuvit Plus Silicone Border Dressing 4x4 (in/in) (Home Health) 2 x Per Week/30 Days ?Discharge Instructions: Apply silicone border over primary dressing as directed. ?Consults ?[facility]- [facility] - Referral to [facility] related to skin cancer with positive biopsy to left posterior lower leg. ***Please ?call patient's stepson Rica Mast at [phone]. - (ICD10 [phone] - Non-pressure chronic ulcer of other part of left lower leg with fat layer exposed) ?Electronic Signature(s) ?Signed: 04/15/2021 5:24:10 PM By: Worthy Keeler PA-C ?Signed: 04/15/2021 5:40:22 PM By: Deon Pilling RN, BSN ?Entered By: Deon Pilling on 04/15/2021  13:32:03 ?Prescription 04/15/2021 ?-------------------------------------------------------------------------------- ?Ronnald Ramp, Cleon Melburn Hake, [place] PA ?Patient Name: Provider: ?1931/12/05 [id number] ?Date of Birth: NPI#: ?  Jerilynn Mages [id number]

## 2021-04-17 DIAGNOSIS — E039 Hypothyroidism, unspecified: Secondary | ICD-10-CM | POA: Diagnosis not present

## 2021-04-17 DIAGNOSIS — I2510 Atherosclerotic heart disease of native coronary artery without angina pectoris: Secondary | ICD-10-CM | POA: Diagnosis not present

## 2021-04-17 DIAGNOSIS — D2372 Other benign neoplasm of skin of left lower limb, including hip: Secondary | ICD-10-CM | POA: Diagnosis not present

## 2021-04-17 DIAGNOSIS — L97429 Non-pressure chronic ulcer of left heel and midfoot with unspecified severity: Secondary | ICD-10-CM | POA: Diagnosis not present

## 2021-04-17 DIAGNOSIS — M109 Gout, unspecified: Secondary | ICD-10-CM | POA: Diagnosis not present

## 2021-04-17 DIAGNOSIS — E1142 Type 2 diabetes mellitus with diabetic polyneuropathy: Secondary | ICD-10-CM | POA: Diagnosis not present

## 2021-04-20 DIAGNOSIS — L97429 Non-pressure chronic ulcer of left heel and midfoot with unspecified severity: Secondary | ICD-10-CM | POA: Diagnosis not present

## 2021-04-20 DIAGNOSIS — E039 Hypothyroidism, unspecified: Secondary | ICD-10-CM | POA: Diagnosis not present

## 2021-04-20 DIAGNOSIS — D2372 Other benign neoplasm of skin of left lower limb, including hip: Secondary | ICD-10-CM | POA: Diagnosis not present

## 2021-04-20 DIAGNOSIS — I2510 Atherosclerotic heart disease of native coronary artery without angina pectoris: Secondary | ICD-10-CM | POA: Diagnosis not present

## 2021-04-20 DIAGNOSIS — M109 Gout, unspecified: Secondary | ICD-10-CM | POA: Diagnosis not present

## 2021-04-20 DIAGNOSIS — E1142 Type 2 diabetes mellitus with diabetic polyneuropathy: Secondary | ICD-10-CM | POA: Diagnosis not present

## 2021-04-22 DIAGNOSIS — E039 Hypothyroidism, unspecified: Secondary | ICD-10-CM | POA: Diagnosis not present

## 2021-04-22 DIAGNOSIS — L97429 Non-pressure chronic ulcer of left heel and midfoot with unspecified severity: Secondary | ICD-10-CM | POA: Diagnosis not present

## 2021-04-22 DIAGNOSIS — E1142 Type 2 diabetes mellitus with diabetic polyneuropathy: Secondary | ICD-10-CM | POA: Diagnosis not present

## 2021-04-22 DIAGNOSIS — I2510 Atherosclerotic heart disease of native coronary artery without angina pectoris: Secondary | ICD-10-CM | POA: Diagnosis not present

## 2021-04-22 DIAGNOSIS — D2372 Other benign neoplasm of skin of left lower limb, including hip: Secondary | ICD-10-CM | POA: Diagnosis not present

## 2021-04-22 DIAGNOSIS — M109 Gout, unspecified: Secondary | ICD-10-CM | POA: Diagnosis not present

## 2021-04-24 DIAGNOSIS — M109 Gout, unspecified: Secondary | ICD-10-CM | POA: Diagnosis not present

## 2021-04-24 DIAGNOSIS — E1142 Type 2 diabetes mellitus with diabetic polyneuropathy: Secondary | ICD-10-CM | POA: Diagnosis not present

## 2021-04-24 DIAGNOSIS — D2372 Other benign neoplasm of skin of left lower limb, including hip: Secondary | ICD-10-CM | POA: Diagnosis not present

## 2021-04-24 DIAGNOSIS — L97429 Non-pressure chronic ulcer of left heel and midfoot with unspecified severity: Secondary | ICD-10-CM | POA: Diagnosis not present

## 2021-04-24 DIAGNOSIS — E039 Hypothyroidism, unspecified: Secondary | ICD-10-CM | POA: Diagnosis not present

## 2021-04-24 DIAGNOSIS — I2510 Atherosclerotic heart disease of native coronary artery without angina pectoris: Secondary | ICD-10-CM | POA: Diagnosis not present

## 2021-04-25 DIAGNOSIS — M130 Polyarthritis, unspecified: Secondary | ICD-10-CM | POA: Diagnosis not present

## 2021-04-25 DIAGNOSIS — L97429 Non-pressure chronic ulcer of left heel and midfoot with unspecified severity: Secondary | ICD-10-CM | POA: Diagnosis not present

## 2021-04-25 DIAGNOSIS — E1142 Type 2 diabetes mellitus with diabetic polyneuropathy: Secondary | ICD-10-CM | POA: Diagnosis not present

## 2021-04-25 DIAGNOSIS — B351 Tinea unguium: Secondary | ICD-10-CM | POA: Diagnosis not present

## 2021-04-25 DIAGNOSIS — D2372 Other benign neoplasm of skin of left lower limb, including hip: Secondary | ICD-10-CM | POA: Diagnosis not present

## 2021-04-25 DIAGNOSIS — J61 Pneumoconiosis due to asbestos and other mineral fibers: Secondary | ICD-10-CM | POA: Diagnosis not present

## 2021-04-25 DIAGNOSIS — I2510 Atherosclerotic heart disease of native coronary artery without angina pectoris: Secondary | ICD-10-CM | POA: Diagnosis not present

## 2021-04-25 DIAGNOSIS — Z9181 History of falling: Secondary | ICD-10-CM | POA: Diagnosis not present

## 2021-04-25 DIAGNOSIS — E039 Hypothyroidism, unspecified: Secondary | ICD-10-CM | POA: Diagnosis not present

## 2021-04-25 DIAGNOSIS — Z7984 Long term (current) use of oral hypoglycemic drugs: Secondary | ICD-10-CM | POA: Diagnosis not present

## 2021-04-25 DIAGNOSIS — E785 Hyperlipidemia, unspecified: Secondary | ICD-10-CM | POA: Diagnosis not present

## 2021-04-25 DIAGNOSIS — L989 Disorder of the skin and subcutaneous tissue, unspecified: Secondary | ICD-10-CM | POA: Diagnosis not present

## 2021-04-25 DIAGNOSIS — M109 Gout, unspecified: Secondary | ICD-10-CM | POA: Diagnosis not present

## 2021-04-25 DIAGNOSIS — I10 Essential (primary) hypertension: Secondary | ICD-10-CM | POA: Diagnosis not present

## 2021-04-25 DIAGNOSIS — Z8546 Personal history of malignant neoplasm of prostate: Secondary | ICD-10-CM | POA: Diagnosis not present

## 2021-04-27 DIAGNOSIS — E119 Type 2 diabetes mellitus without complications: Secondary | ICD-10-CM | POA: Diagnosis not present

## 2021-04-27 DIAGNOSIS — I10 Essential (primary) hypertension: Secondary | ICD-10-CM | POA: Diagnosis not present

## 2021-04-27 DIAGNOSIS — E039 Hypothyroidism, unspecified: Secondary | ICD-10-CM | POA: Diagnosis not present

## 2021-04-27 DIAGNOSIS — Z8673 Personal history of transient ischemic attack (TIA), and cerebral infarction without residual deficits: Secondary | ICD-10-CM | POA: Diagnosis not present

## 2021-04-29 DIAGNOSIS — M109 Gout, unspecified: Secondary | ICD-10-CM | POA: Diagnosis not present

## 2021-04-29 DIAGNOSIS — E039 Hypothyroidism, unspecified: Secondary | ICD-10-CM | POA: Diagnosis not present

## 2021-04-29 DIAGNOSIS — E1142 Type 2 diabetes mellitus with diabetic polyneuropathy: Secondary | ICD-10-CM | POA: Diagnosis not present

## 2021-04-29 DIAGNOSIS — L97429 Non-pressure chronic ulcer of left heel and midfoot with unspecified severity: Secondary | ICD-10-CM | POA: Diagnosis not present

## 2021-04-29 DIAGNOSIS — D2372 Other benign neoplasm of skin of left lower limb, including hip: Secondary | ICD-10-CM | POA: Diagnosis not present

## 2021-04-29 DIAGNOSIS — I2510 Atherosclerotic heart disease of native coronary artery without angina pectoris: Secondary | ICD-10-CM | POA: Diagnosis not present

## 2021-05-01 DIAGNOSIS — E039 Hypothyroidism, unspecified: Secondary | ICD-10-CM | POA: Diagnosis not present

## 2021-05-01 DIAGNOSIS — M109 Gout, unspecified: Secondary | ICD-10-CM | POA: Diagnosis not present

## 2021-05-01 DIAGNOSIS — E1142 Type 2 diabetes mellitus with diabetic polyneuropathy: Secondary | ICD-10-CM | POA: Diagnosis not present

## 2021-05-01 DIAGNOSIS — L97429 Non-pressure chronic ulcer of left heel and midfoot with unspecified severity: Secondary | ICD-10-CM | POA: Diagnosis not present

## 2021-05-01 DIAGNOSIS — I2510 Atherosclerotic heart disease of native coronary artery without angina pectoris: Secondary | ICD-10-CM | POA: Diagnosis not present

## 2021-05-01 DIAGNOSIS — D2372 Other benign neoplasm of skin of left lower limb, including hip: Secondary | ICD-10-CM | POA: Diagnosis not present

## 2021-05-04 ENCOUNTER — Ambulatory Visit: Payer: Medicare Other | Admitting: Student

## 2021-05-04 ENCOUNTER — Ambulatory Visit: Payer: Medicare Other | Admitting: Cardiology

## 2021-05-04 DIAGNOSIS — E1142 Type 2 diabetes mellitus with diabetic polyneuropathy: Secondary | ICD-10-CM | POA: Diagnosis not present

## 2021-05-04 DIAGNOSIS — M109 Gout, unspecified: Secondary | ICD-10-CM | POA: Diagnosis not present

## 2021-05-04 DIAGNOSIS — D2372 Other benign neoplasm of skin of left lower limb, including hip: Secondary | ICD-10-CM | POA: Diagnosis not present

## 2021-05-04 DIAGNOSIS — E039 Hypothyroidism, unspecified: Secondary | ICD-10-CM | POA: Diagnosis not present

## 2021-05-04 DIAGNOSIS — I2510 Atherosclerotic heart disease of native coronary artery without angina pectoris: Secondary | ICD-10-CM | POA: Diagnosis not present

## 2021-05-04 DIAGNOSIS — L97429 Non-pressure chronic ulcer of left heel and midfoot with unspecified severity: Secondary | ICD-10-CM | POA: Diagnosis not present

## 2021-05-05 DIAGNOSIS — E119 Type 2 diabetes mellitus without complications: Secondary | ICD-10-CM | POA: Diagnosis not present

## 2021-05-05 DIAGNOSIS — I10 Essential (primary) hypertension: Secondary | ICD-10-CM | POA: Diagnosis not present

## 2021-05-05 DIAGNOSIS — E039 Hypothyroidism, unspecified: Secondary | ICD-10-CM | POA: Diagnosis not present

## 2021-05-06 ENCOUNTER — Encounter (HOSPITAL_BASED_OUTPATIENT_CLINIC_OR_DEPARTMENT_OTHER): Payer: Medicare Other | Attending: Physician Assistant | Admitting: Physician Assistant

## 2021-05-06 DIAGNOSIS — C44729 Squamous cell carcinoma of skin of left lower limb, including hip: Secondary | ICD-10-CM | POA: Diagnosis not present

## 2021-05-06 DIAGNOSIS — Z85828 Personal history of other malignant neoplasm of skin: Secondary | ICD-10-CM | POA: Insufficient documentation

## 2021-05-06 DIAGNOSIS — L97222 Non-pressure chronic ulcer of left calf with fat layer exposed: Secondary | ICD-10-CM | POA: Diagnosis not present

## 2021-05-06 DIAGNOSIS — L97822 Non-pressure chronic ulcer of other part of left lower leg with fat layer exposed: Secondary | ICD-10-CM | POA: Diagnosis not present

## 2021-05-06 DIAGNOSIS — L988 Other specified disorders of the skin and subcutaneous tissue: Secondary | ICD-10-CM | POA: Diagnosis not present

## 2021-05-06 DIAGNOSIS — E11622 Type 2 diabetes mellitus with other skin ulcer: Secondary | ICD-10-CM | POA: Diagnosis not present

## 2021-05-06 DIAGNOSIS — Z79899 Other long term (current) drug therapy: Secondary | ICD-10-CM | POA: Diagnosis not present

## 2021-05-06 DIAGNOSIS — I10 Essential (primary) hypertension: Secondary | ICD-10-CM | POA: Insufficient documentation

## 2021-05-06 NOTE — Progress Notes (Addendum)
Alan Rubio, Alan Rubio ([id number]) ?Visit Report for 05/06/2021 ?Chief Complaint Document Details ?Patient Name: Date of Service: ?Alan Rubio, Alan Rubio 05/06/2021 1:30 PM ?Medical Record Number: [id number] ?Patient Account Number: [id number] ?Date of Birth/Sex: Treating RN: ?February 15, 1931 (88 y.o. M) [name], [name] ?Primary Care Provider: [name] Other Clinician: ?Referring Provider: ?Treating Provider/Extender: Worthy Keeler ?[name] ?Weeks in Treatment: 5 ?Information Obtained from: Patient ?Chief Complaint ?Left LE Ulcer ?Electronic Signature(s) ?Signed: 05/06/2021 1:42:20 PM By: Worthy Keeler PA-C ?Previous Signature: 05/06/2021 1:32:25 PM Version By: Worthy Keeler PA-C ?Entered By: Worthy Keeler on 05/06/2021 13:42:20 ?-------------------------------------------------------------------------------- ?HPI Details ?Patient Name: Date of Service: ?Alan Rubio, Alan Rubio 05/06/2021 1:30 PM ?Medical Record Number: [id number] ?Patient Account Number: [id number] ?Date of Birth/Sex: Treating RN: ?1931/04/17 (88 y.o. M) [name], [name] ?Primary Care Provider: [name] Other Clinician: ?Referring Provider: ?Treating Provider/Extender: Worthy Keeler ?[name] ?Weeks in Treatment: 5 ?History of Present Illness ?HPI Description: 04/01/2021 upon evaluation today patient presents for initial inspection here in the clinic concerning issues that has been having with a wound ?on the left lateral/posterior leg which has been present he tells me for at least a year. He is not exactly sure how this, but seem to just be gradual appearing. ?With that being said he was seen by his primary care provider for a time they were not able to get this healed. He also has home health coming out they have ?been trying to help out as well. Unfortunately this just does not seem to be healing as quickly as he would like to see in fact he is really not seeing much in the ?way of healing at all. There is definitely overgrowth that is almost mushroom doubt this  really has the appearance in my opinion of likely being a skin cancer. ?Possibly squamous cell carcinoma. With that being said I do think that a biopsy for confirmation would be warranted in this case. That was discussed with the ?patient today as well. ?With that being said the patient does have a history of diabetes mellitus type 2, hypertension, and again that he has been using some medications on this ?home health has been performing the medication and dressing changes for him currently. He does not have any history of skin cancer that he is aware of. He ?does have an ABI that was performed May 16, 2020 and showed to be 1.10 on this left side. His most recent hemoglobin A1c was 8.4. ?04/15/2021 upon evaluation today patient presents for follow-up concerning his wound on the left posterior lower leg. 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 That was 2 weeks ago. We do have the result back finally which is great news. With that being said it did show that there ?was evidence of atypical cells in both the wide and deep margins. I do believe that he is can require referral to a Mohs surgeon for evaluation and management ?of this condition. ?05-06-2021 upon evaluation today patient appears to be doing well currently with regard to his wound. He has been tolerating the dressing changes he actually ?has the appointment on 1 May in order to have the skin cancer removed. Overall I think that this is going to do quite well for him to be honest. ?Electronic Signature(s) ?Signed: 05/06/2021 1:57:16 PM By: Worthy Keeler PA-C ?Entered By: Worthy Keeler on 05/06/2021 13:57:16 ?-------------------------------------------------------------------------------- ?Physical Exam Details ?Patient Name: Date of Service: ?Alan Rubio, Alan Rubio 05/06/2021 1:30 PM ?Medical Record Number: [id number] ?Patient  Account Number: [id number] ?Date of Birth/Sex: Treating RN: ?1931/11/02 (88 y.o. M) [name], [name] ?Primary  Care Provider: [name] Other Clinician: ?Referring Provider: ?Treating Provider/Extender: Worthy Keeler ?[name] ?Weeks in Treatment: 5 ?Constitutional ?Well-nourished and well-hydrated in no acute distress. ?Respiratory ?normal breathing without difficulty. ?Psychiatric ?this patient is able to make decisions and demonstrates good insight into disease process. Alert and Oriented x 3. pleasant and cooperative. ?Notes ?Upon inspection patient's wound bed actually showed signs of good granulation over the place. With that being said this is still raised area and obvious that it is ?cancerous. It does need to be removed and that will be taken care of with the Mohs surgery on May 1. ?Electronic Signature(s) ?Signed: 05/06/2021 1:58:04 PM By: Worthy Keeler PA-C ?Entered By: Worthy Keeler on 05/06/2021 13:58:04 ?-------------------------------------------------------------------------------- ?Physician Orders Details ?Patient Name: Date of Service: ?Alan Rubio, Alan Rubio 05/06/2021 1:30 PM ?Medical Record Number: [id number] ?Patient Account Number: [id number] ?Date of Birth/Sex: Treating RN: ?May 19, 1931 (88 y.o. 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 Call wound center if skin surgery center refers you back to wound center. ?Bathing/ Shower/ Hygiene ?May shower and wash wound with soap and water.  - May shower when changing dressing. ?Additional Orders / Instructions ?Follow Nutritious Diet ?Home Health ?No change in wound care orders this week; continue Home Health for wound care. May utilize formulary equivalent dressing for wound ?treatment orders unless otherwise specified. - Skilled nursing for dressing changes 2x week. Patient following with skin surgery center on 05/25/2021. ?That office should sent orders thus after for wound care. ?Other Home Health Orders/Instructions: [facility] ?Wound Treatment ?Wound #1 - Lower Leg Wound Laterality: Left, Posterior ?Cleanser: Soap and Water (Home Health) 2 x Per Week/30 Days ?Discharge Instructions: May shower and wash wound with dial antibacterial soap and water prior to dressing change. ?Cleanser: Wound Cleanser (Home Health) 2 x Per Week/30 Days ?Discharge Instructions: Cleanse the wound with wound cleanser prior to applying a clean dressing using gauze sponges, not tissue or cotton balls. ?Peri-Wound Care: Skin Prep (Home Health) 2 x Per Week/30 Days ?Discharge Instructions: Use skin prep as directed ?Prim Dressing: Hydrofera Blue Ready Foam, 2.5 x2.5 in (Home Health) 2 x Per Week/30 Days ?ary ?Discharge Instructions: Apply to wound bed as instructed ?Secondary Dressing: Zetuvit Plus Silicone Border Dressing 4x4 (in/in) (Home Health) 2 x Per Week/30 Days ?Discharge Instructions: Apply silicone border over primary dressing as directed. ?Electronic Signature(s) ?Signed: 05/06/2021 2:06:11 PM By: Worthy Keeler PA-C ?Signed: 05/06/2021 5:47:39 PM By: Deon Pilling RN, BSN ?Entered By: Deon Pilling on 05/06/2021 13:36:27 ?-------------------------------------------------------------------------------- ?Problem List Details ?Patient Name: ?Date of Service: ?Alan Rubio, [place] 05/06/2021 1:30 PM ?Medical Record Number: [id number] ?Patient Account Number: [id number] ?Date of Birth/Sex: ?Treating RN: ?1931-04-16 (88 y.o. M) [name], [name] ?Primary Care Provider: [name] ?Other Clinician: ?Referring Provider: ?Treating Provider/Extender: Worthy Keeler ?[name] ?Weeks in Treatment: 5 ?Active Problems ?ICD-10 ?Encounter ?Code Description Active Date MDM ?Diag

## 2021-05-06 NOTE — Progress Notes (Addendum)
Rubio, Alan ([id number]) ?Visit Report for 05/06/2021 ?Arrival Information Details ?Patient Name: Date of Service: ?Alan Rubio, Alan Rubio 05/06/2021 1:30 PM ?Medical Record Number: [id number] ?Patient Account Number: [id number] ?Date of Birth/Sex: Treating RN: ?November 22, 1931 (88 y.o. Male) [name], Tammi Klippel ?Primary Care Alan Rubio: [name] Other Clinician: ?Referring Alan Rubio: ?Treating Alan Rubio/Extender: Worthy Keeler ?[name] ?Weeks in Treatment: 5 ?Visit Information History Since Last Visit ?Added or deleted any medications: No ?Patient Arrived: Alan Rubio ?Any new allergies or adverse reactions: No ?Arrival Time: 13:20 ?Had a fall or experienced change in No ?Accompanied By: son ?activities of daily living that may affect ?Transfer Assistance: None ?risk of falls: ?Patient Identification Verified: Yes ?Signs or symptoms of abuse/neglect since last visito No ?Secondary Verification Process Completed: Yes ?Hospitalized since last visit: No ?Patient Requires Transmission-Based Precautions: No ?Implantable device outside of the clinic excluding No ?Patient Has Alerts: Yes ?cellular tissue based products placed in the center ?Patient Alerts: L ABI= Non Comp since last visit: ?Has Dressing in Place as Prescribed: Yes ?Pain Present Now: No ?Electronic Signature(s) ?Signed: 05/06/2021 5:47:39 PM By: Deon Pilling RN, BSN ?Entered By: Deon Pilling on 05/06/2021 13:23:50 ?-------------------------------------------------------------------------------- ?Clinic Level of Care Assessment Details ?Patient Name: Date of Service: ?Alan Rubio, Alan Rubio 05/06/2021 1:30 PM ?Medical Record Number: [id number] ?Patient Account Number: [id number] ?Date of Birth/Sex: Treating RN: ?May 25, 1931 (88 y.o. Male) [name], Tammi Klippel ?Primary Care Alan Rubio: [name] Other Clinician: ?Referring Alan Rubio: ?Treating Alan Rubio/Extender: Worthy Keeler ?[name] ?Weeks in Treatment: 5 ?Clinic Level of Care Assessment Items ?TOOL 4 Quantity Score ?X- 1 0 ?Use when  only an EandM is performed on FOLLOW-UP visit ?ASSESSMENTS - Nursing Assessment / Reassessment ?X- 1 10 ?Reassessment of Co-morbidities (includes updates in patient status) ?X- 1 5 ?Reassessment of Adherence to Treatment Plan ?ASSESSMENTS - Wound and Skin A ssessment / Reassessment ?X - Simple Wound Assessment / Reassessment - one wound 1 5 ?'[]'$  - 0 ?Complex Wound Assessment / Reassessment - multiple wounds ?X- 1 10 ?Dermatologic / Skin Assessment (not related to wound area) ?ASSESSMENTS - Focused Assessment ?X- 1 5 ?Circumferential Edema Measurements - multi extremities ?'[]'$  - 0 ?Nutritional Assessment / Counseling / Intervention ?'[]'$  - 0 ?Lower Extremity Assessment (monofilament, tuning fork, pulses) ?'[]'$  - 0 ?Peripheral Arterial Disease Assessment (using hand held doppler) ?ASSESSMENTS - Ostomy and/or Continence Assessment and Care ?'[]'$  - 0 ?Incontinence Assessment and Management ?'[]'$  - 0 ?Ostomy Care Assessment and Management (repouching, etc.) ?PROCESS - Coordination of Care ?X - Simple Patient / Family Education for ongoing care 1 15 ?'[]'$  - 0 ?Complex (extensive) Patient / Family Education for ongoing care ?X- 1 10 ?Staff obtains Consents, Records, T Results / Process Orders ?est ?X- 1 10 ?Staff telephones HHA, Nursing Homes / Clarify orders / etc ?'[]'$  - 0 ?Routine Transfer to another Facility (non-emergent condition) ?'[]'$  - 0 ?Routine Hospital Admission (non-emergent condition) ?'[]'$  - 0 ?New Admissions / Biomedical engineer / Ordering NPWT Apligraf, etc. ?, ?'[]'$  - 0 ?Emergency Hospital Admission (emergent condition) ?X- 1 10 ?Simple Discharge Coordination ?'[]'$  - 0 ?Complex (extensive) Discharge Coordination ?PROCESS - Special Needs ?'[]'$  - 0 ?Pediatric / Minor Patient Management ?'[]'$  - 0 ?Isolation Patient Management ?'[]'$  - 0 ?Hearing / Language / Visual special needs ?'[]'$  - 0 ?Assessment of Community assistance (transportation, D/C planning, etc.) ?'[]'$  - 0 ?Additional assistance / Altered mentation ?'[]'$  - 0 ?Support  Surface(s) Assessment (bed, cushion, seat, etc.) ?INTERVENTIONS - Wound Cleansing / Measurement ?X - Simple Wound Cleansing - one wound 1 5 ?'[]'$  - 0 ?Complex  Wound Cleansing - multiple wounds ?X- 1 5 ?Wound Imaging (photographs - any number of wounds) ?'[]'$  - 0 ?Wound Tracing (instead of photographs) ?X- 1 5 ?Simple Wound Measurement - one wound ?'[]'$  - 0 ?Complex Wound Measurement - multiple wounds ?INTERVENTIONS - Wound Dressings ?X - Small Wound Dressing one or multiple wounds 1 10 ?'[]'$  - 0 ?Medium Wound Dressing one or multiple wounds ?'[]'$  - 0 ?Large Wound Dressing one or multiple wounds ?'[]'$  - 0 ?Application of Medications - topical ?'[]'$  - 0 ?Application of Medications - injection ?INTERVENTIONS - Miscellaneous ?'[]'$  - 0 ?External ear exam ?'[]'$  - 0 ?Specimen Collection (cultures, biopsies, blood, body fluids, etc.) ?'[]'$  - 0 ?Specimen(s) / Culture(s) sent or taken to Lab for analysis ?'[]'$  - 0 ?Patient Transfer (multiple staff / Civil Service fast streamer / Similar devices) ?'[]'$  - 0 ?Simple Staple / Suture removal (25 or less) ?'[]'$  - 0 ?Complex Staple / Suture removal (26 or more) ?'[]'$  - 0 ?Hypo / Hyperglycemic Management (close monitor of Blood Glucose) ?'[]'$  - 0 ?Ankle / Brachial Index (ABI) - do not check if billed separately ?X- 1 5 ?Vital Signs ?Has the patient been seen at the hospital within the last three years: Yes ?Total Score: 110 ?Level Of Care: New/Established - Level 3 ?Electronic Signature(s) ?Signed: 05/06/2021 5:47:39 PM By: Deon Pilling RN, BSN ?Entered By: Deon Pilling on 05/06/2021 13:37:08 ?-------------------------------------------------------------------------------- ?Encounter Discharge Information Details ?Patient Name: Date of Service: ?Alan Rubio, Alan Rubio 05/06/2021 1:30 PM ?Medical Record Number: [id number] ?Patient Account Number: [id number] ?Date of Birth/Sex: Treating RN: ?April 16, 1931 (88 y.o. Male) [name], Tammi Klippel ?Primary Care Makiah Clauson: [name] Other Clinician: ?Referring Avarie Tavano: ?Treating Tamicka Shimon/Extender: Worthy Keeler ?[name] ?Weeks in Treatment: 5 ?Encounter Discharge Information Items ?Discharge Condition: Stable ?Ambulatory Status: Alan Rubio ?Discharge Destination: Home ?Transportation: Private Auto ?Accompanied By: family member ?Schedule Follow-up Appointment: Yes ?Clinical Summary of Care: ?Electronic Signature(s) ?Signed: 05/06/2021 5:47:39 PM By: Deon Pilling RN, BSN ?Entered By: Deon Pilling on 05/06/2021 13:37:53 ?-------------------------------------------------------------------------------- ?Lower Extremity Assessment Details ?Patient Name: Date of Service: ?Alan Rubio, Alan Rubio 05/06/2021 1:30 PM ?Medical Record Number: [id number] ?Patient Account Number: [id number] ?Date of Birth/Sex: Treating RN: ?October 28, 1931 (88 y.o. Male) [name], Tammi Klippel ?Primary Care Ming Kunka: [name] Other Clinician: ?Referring Markiya Keefe: ?Treating Holman Bonsignore/Extender: Worthy Keeler ?[name] ?Weeks in Treatment: 5 ?Edema Assessment ?Assessed: [Left: Yes] [Right: No] ?Edema: [Left: N] [Right: o] ?Calf ?Left: Right: ?Point of Measurement: 34 cm From Medial Instep 36 cm ?Ankle ?Left: Right: ?Point of Measurement: 10 cm From Medial Instep 23 cm ?Vascular Assessment ?Pulses: ?Dorsalis Pedis ?Palpable: [Left:Yes] ?Electronic Signature(s) ?Signed: 05/06/2021 5:47:39 PM By: Deon Pilling RN, BSN ?Entered By: Deon Pilling on 05/06/2021 13:25:47 ?-------------------------------------------------------------------------------- ?Multi-Disciplinary Care Plan Details ?Patient Name: ?Date of Service: ?Alan Rubio, Alan Rubio 05/06/2021 1:30 PM ?Medical Record Number: [id number] ?Patient Account Number: [id number] ?Date of Birth/Sex: ?Treating RN: ?12-05-1931 (88 y.o. Male) [name], Tammi Klippel ?Primary Care Shiv Shuey: [name] ?Other Clinician: ?Referring Tonique Mendonca: ?Treating Liani Caris/Extender: Worthy Keeler ?[name] ?Weeks in Treatment: 5 ?Active Inactive ?Electronic Signature(s) ?Signed: 06/05/2021 8:06:45 AM By: Deon Pilling RN, BSN ?Previous  Signature: 05/06/2021 5:47:39 PM Version By: Deon Pilling RN, BSN ?Entered By: Deon Pilling on 06/05/2021 08:06:45 ?-------------------------------------------------------------------------------- ?Pain Assessment Tennis Must

## 2021-05-08 DIAGNOSIS — I2510 Atherosclerotic heart disease of native coronary artery without angina pectoris: Secondary | ICD-10-CM | POA: Diagnosis not present

## 2021-05-08 DIAGNOSIS — D2372 Other benign neoplasm of skin of left lower limb, including hip: Secondary | ICD-10-CM | POA: Diagnosis not present

## 2021-05-08 DIAGNOSIS — E039 Hypothyroidism, unspecified: Secondary | ICD-10-CM | POA: Diagnosis not present

## 2021-05-08 DIAGNOSIS — E1142 Type 2 diabetes mellitus with diabetic polyneuropathy: Secondary | ICD-10-CM | POA: Diagnosis not present

## 2021-05-08 DIAGNOSIS — L97429 Non-pressure chronic ulcer of left heel and midfoot with unspecified severity: Secondary | ICD-10-CM | POA: Diagnosis not present

## 2021-05-08 DIAGNOSIS — M109 Gout, unspecified: Secondary | ICD-10-CM | POA: Diagnosis not present

## 2021-05-12 DIAGNOSIS — D2372 Other benign neoplasm of skin of left lower limb, including hip: Secondary | ICD-10-CM | POA: Diagnosis not present

## 2021-05-12 DIAGNOSIS — E1142 Type 2 diabetes mellitus with diabetic polyneuropathy: Secondary | ICD-10-CM | POA: Diagnosis not present

## 2021-05-12 DIAGNOSIS — L97429 Non-pressure chronic ulcer of left heel and midfoot with unspecified severity: Secondary | ICD-10-CM | POA: Diagnosis not present

## 2021-05-12 DIAGNOSIS — I2510 Atherosclerotic heart disease of native coronary artery without angina pectoris: Secondary | ICD-10-CM | POA: Diagnosis not present

## 2021-05-12 DIAGNOSIS — E039 Hypothyroidism, unspecified: Secondary | ICD-10-CM | POA: Diagnosis not present

## 2021-05-12 DIAGNOSIS — M109 Gout, unspecified: Secondary | ICD-10-CM | POA: Diagnosis not present

## 2021-05-13 DIAGNOSIS — Z20822 Contact with and (suspected) exposure to covid-19: Secondary | ICD-10-CM | POA: Diagnosis not present

## 2021-05-14 DIAGNOSIS — Z20822 Contact with and (suspected) exposure to covid-19: Secondary | ICD-10-CM | POA: Diagnosis not present

## 2021-05-15 DIAGNOSIS — M109 Gout, unspecified: Secondary | ICD-10-CM | POA: Diagnosis not present

## 2021-05-15 DIAGNOSIS — E1142 Type 2 diabetes mellitus with diabetic polyneuropathy: Secondary | ICD-10-CM | POA: Diagnosis not present

## 2021-05-15 DIAGNOSIS — E039 Hypothyroidism, unspecified: Secondary | ICD-10-CM | POA: Diagnosis not present

## 2021-05-15 DIAGNOSIS — D2372 Other benign neoplasm of skin of left lower limb, including hip: Secondary | ICD-10-CM | POA: Diagnosis not present

## 2021-05-15 DIAGNOSIS — L97429 Non-pressure chronic ulcer of left heel and midfoot with unspecified severity: Secondary | ICD-10-CM | POA: Diagnosis not present

## 2021-05-15 DIAGNOSIS — I2510 Atherosclerotic heart disease of native coronary artery without angina pectoris: Secondary | ICD-10-CM | POA: Diagnosis not present

## 2021-05-18 DIAGNOSIS — I2510 Atherosclerotic heart disease of native coronary artery without angina pectoris: Secondary | ICD-10-CM | POA: Diagnosis not present

## 2021-05-18 DIAGNOSIS — E1142 Type 2 diabetes mellitus with diabetic polyneuropathy: Secondary | ICD-10-CM | POA: Diagnosis not present

## 2021-05-18 DIAGNOSIS — E039 Hypothyroidism, unspecified: Secondary | ICD-10-CM | POA: Diagnosis not present

## 2021-05-18 DIAGNOSIS — L97429 Non-pressure chronic ulcer of left heel and midfoot with unspecified severity: Secondary | ICD-10-CM | POA: Diagnosis not present

## 2021-05-18 DIAGNOSIS — M109 Gout, unspecified: Secondary | ICD-10-CM | POA: Diagnosis not present

## 2021-05-18 DIAGNOSIS — D2372 Other benign neoplasm of skin of left lower limb, including hip: Secondary | ICD-10-CM | POA: Diagnosis not present

## 2021-05-20 DIAGNOSIS — R051 Acute cough: Secondary | ICD-10-CM | POA: Diagnosis not present

## 2021-05-20 DIAGNOSIS — R059 Cough, unspecified: Secondary | ICD-10-CM | POA: Diagnosis not present

## 2021-05-20 DIAGNOSIS — Z20822 Contact with and (suspected) exposure to covid-19: Secondary | ICD-10-CM | POA: Diagnosis not present

## 2021-05-21 DIAGNOSIS — E1142 Type 2 diabetes mellitus with diabetic polyneuropathy: Secondary | ICD-10-CM | POA: Diagnosis not present

## 2021-05-21 DIAGNOSIS — I2510 Atherosclerotic heart disease of native coronary artery without angina pectoris: Secondary | ICD-10-CM | POA: Diagnosis not present

## 2021-05-21 DIAGNOSIS — D2372 Other benign neoplasm of skin of left lower limb, including hip: Secondary | ICD-10-CM | POA: Diagnosis not present

## 2021-05-21 DIAGNOSIS — E039 Hypothyroidism, unspecified: Secondary | ICD-10-CM | POA: Diagnosis not present

## 2021-05-21 DIAGNOSIS — M109 Gout, unspecified: Secondary | ICD-10-CM | POA: Diagnosis not present

## 2021-05-21 DIAGNOSIS — L97429 Non-pressure chronic ulcer of left heel and midfoot with unspecified severity: Secondary | ICD-10-CM | POA: Diagnosis not present

## 2021-05-22 ENCOUNTER — Emergency Department (HOSPITAL_COMMUNITY): Payer: Medicare Other

## 2021-05-22 ENCOUNTER — Other Ambulatory Visit: Payer: Self-pay

## 2021-05-22 ENCOUNTER — Encounter (HOSPITAL_COMMUNITY): Payer: Self-pay

## 2021-05-22 ENCOUNTER — Inpatient Hospital Stay (HOSPITAL_COMMUNITY)
Admission: EM | Admit: 2021-05-22 | Discharge: 2021-05-25 | DRG: 064 | Disposition: A | Discharge status: Rehabilitation Facility | Payer: Medicare Other | Attending: Internal Medicine | Admitting: Internal Medicine

## 2021-05-22 DIAGNOSIS — R29702 NIHSS score 2: Secondary | ICD-10-CM | POA: Diagnosis present

## 2021-05-22 DIAGNOSIS — Z7984 Long term (current) use of oral hypoglycemic drugs: Secondary | ICD-10-CM | POA: Diagnosis not present

## 2021-05-22 DIAGNOSIS — E039 Hypothyroidism, unspecified: Secondary | ICD-10-CM | POA: Diagnosis present

## 2021-05-22 DIAGNOSIS — I6389 Other cerebral infarction: Secondary | ICD-10-CM | POA: Diagnosis not present

## 2021-05-22 DIAGNOSIS — R278 Other lack of coordination: Secondary | ICD-10-CM | POA: Diagnosis present

## 2021-05-22 DIAGNOSIS — R410 Disorientation, unspecified: Secondary | ICD-10-CM | POA: Diagnosis not present

## 2021-05-22 DIAGNOSIS — I10 Essential (primary) hypertension: Secondary | ICD-10-CM | POA: Diagnosis present

## 2021-05-22 DIAGNOSIS — Z880 Allergy status to penicillin: Secondary | ICD-10-CM | POA: Diagnosis not present

## 2021-05-22 DIAGNOSIS — W19XXXA Unspecified fall, initial encounter: Secondary | ICD-10-CM | POA: Diagnosis not present

## 2021-05-22 DIAGNOSIS — R471 Dysarthria and anarthria: Secondary | ICD-10-CM | POA: Diagnosis present

## 2021-05-22 DIAGNOSIS — I6329 Cerebral infarction due to unspecified occlusion or stenosis of other precerebral arteries: Principal | ICD-10-CM | POA: Diagnosis present

## 2021-05-22 DIAGNOSIS — Z79899 Other long term (current) drug therapy: Secondary | ICD-10-CM

## 2021-05-22 DIAGNOSIS — G9340 Encephalopathy, unspecified: Secondary | ICD-10-CM | POA: Diagnosis not present

## 2021-05-22 DIAGNOSIS — R2990 Unspecified symptoms and signs involving the nervous system: Secondary | ICD-10-CM | POA: Diagnosis not present

## 2021-05-22 DIAGNOSIS — I6381 Other cerebral infarction due to occlusion or stenosis of small artery: Secondary | ICD-10-CM | POA: Diagnosis present

## 2021-05-22 DIAGNOSIS — Y92009 Unspecified place in unspecified non-institutional (private) residence as the place of occurrence of the external cause: Secondary | ICD-10-CM

## 2021-05-22 DIAGNOSIS — I639 Cerebral infarction, unspecified: Secondary | ICD-10-CM | POA: Diagnosis not present

## 2021-05-22 DIAGNOSIS — R944 Abnormal results of kidney function studies: Secondary | ICD-10-CM | POA: Diagnosis not present

## 2021-05-22 DIAGNOSIS — E119 Type 2 diabetes mellitus without complications: Secondary | ICD-10-CM

## 2021-05-22 DIAGNOSIS — H9190 Unspecified hearing loss, unspecified ear: Secondary | ICD-10-CM | POA: Diagnosis not present

## 2021-05-22 DIAGNOSIS — I4891 Unspecified atrial fibrillation: Secondary | ICD-10-CM | POA: Diagnosis present

## 2021-05-22 DIAGNOSIS — G8311 Monoplegia of lower limb affecting right dominant side: Secondary | ICD-10-CM | POA: Diagnosis present

## 2021-05-22 DIAGNOSIS — R531 Weakness: Principal | ICD-10-CM

## 2021-05-22 DIAGNOSIS — Z823 Family history of stroke: Secondary | ICD-10-CM | POA: Diagnosis not present

## 2021-05-22 DIAGNOSIS — I6302 Cerebral infarction due to thrombosis of basilar artery: Secondary | ICD-10-CM

## 2021-05-22 DIAGNOSIS — I6789 Other cerebrovascular disease: Secondary | ICD-10-CM | POA: Diagnosis present

## 2021-05-22 DIAGNOSIS — Z8673 Personal history of transient ischemic attack (TIA), and cerebral infarction without residual deficits: Secondary | ICD-10-CM

## 2021-05-22 DIAGNOSIS — R404 Transient alteration of awareness: Secondary | ICD-10-CM | POA: Diagnosis not present

## 2021-05-22 DIAGNOSIS — R29818 Other symptoms and signs involving the nervous system: Secondary | ICD-10-CM | POA: Diagnosis not present

## 2021-05-22 DIAGNOSIS — E7800 Pure hypercholesterolemia, unspecified: Secondary | ICD-10-CM | POA: Diagnosis not present

## 2021-05-22 DIAGNOSIS — R29701 NIHSS score 1: Secondary | ICD-10-CM | POA: Diagnosis not present

## 2021-05-22 DIAGNOSIS — I951 Orthostatic hypotension: Secondary | ICD-10-CM | POA: Diagnosis not present

## 2021-05-22 DIAGNOSIS — Z79890 Hormone replacement therapy (postmenopausal): Secondary | ICD-10-CM | POA: Diagnosis not present

## 2021-05-22 DIAGNOSIS — E785 Hyperlipidemia, unspecified: Secondary | ICD-10-CM | POA: Diagnosis present

## 2021-05-22 DIAGNOSIS — Z7982 Long term (current) use of aspirin: Secondary | ICD-10-CM

## 2021-05-22 DIAGNOSIS — G8314 Monoplegia of lower limb affecting left nondominant side: Secondary | ICD-10-CM | POA: Diagnosis present

## 2021-05-22 DIAGNOSIS — I69341 Monoplegia of lower limb following cerebral infarction affecting right dominant side: Secondary | ICD-10-CM | POA: Diagnosis not present

## 2021-05-22 DIAGNOSIS — G459 Transient cerebral ischemic attack, unspecified: Secondary | ICD-10-CM | POA: Diagnosis not present

## 2021-05-22 DIAGNOSIS — G9341 Metabolic encephalopathy: Secondary | ICD-10-CM | POA: Diagnosis not present

## 2021-05-22 DIAGNOSIS — K5900 Constipation, unspecified: Secondary | ICD-10-CM | POA: Diagnosis not present

## 2021-05-22 DIAGNOSIS — M542 Cervicalgia: Secondary | ICD-10-CM | POA: Diagnosis not present

## 2021-05-22 DIAGNOSIS — I6340 Cerebral infarction due to embolism of unspecified cerebral artery: Secondary | ICD-10-CM | POA: Diagnosis not present

## 2021-05-22 DIAGNOSIS — Z95818 Presence of other cardiac implants and grafts: Secondary | ICD-10-CM

## 2021-05-22 DIAGNOSIS — M109 Gout, unspecified: Secondary | ICD-10-CM | POA: Diagnosis not present

## 2021-05-22 DIAGNOSIS — I69322 Dysarthria following cerebral infarction: Secondary | ICD-10-CM | POA: Diagnosis not present

## 2021-05-22 DIAGNOSIS — Z974 Presence of external hearing-aid: Secondary | ICD-10-CM | POA: Diagnosis not present

## 2021-05-22 DIAGNOSIS — I69344 Monoplegia of lower limb following cerebral infarction affecting left non-dominant side: Secondary | ICD-10-CM | POA: Diagnosis not present

## 2021-05-22 DIAGNOSIS — R29700 NIHSS score 0: Secondary | ICD-10-CM | POA: Diagnosis not present

## 2021-05-22 DIAGNOSIS — Z4509 Encounter for adjustment and management of other cardiac device: Secondary | ICD-10-CM | POA: Diagnosis not present

## 2021-05-22 DIAGNOSIS — Z20822 Contact with and (suspected) exposure to covid-19: Secondary | ICD-10-CM | POA: Diagnosis not present

## 2021-05-22 DIAGNOSIS — E1165 Type 2 diabetes mellitus with hyperglycemia: Secondary | ICD-10-CM | POA: Diagnosis present

## 2021-05-22 DIAGNOSIS — E1159 Type 2 diabetes mellitus with other circulatory complications: Secondary | ICD-10-CM | POA: Diagnosis not present

## 2021-05-22 LAB — CBC
HCT: 39.7 % (ref 39.0–52.0)
Hemoglobin: 13.7 g/dL (ref 13.0–17.0)
MCH: 32 pg (ref 26.0–34.0)
MCHC: 34.5 g/dL (ref 30.0–36.0)
MCV: 92.8 fL (ref 80.0–100.0)
Platelets: 208 10*3/uL (ref 150–400)
RBC: 4.28 MIL/uL (ref 4.22–5.81)
RDW: 13 % (ref 11.5–15.5)
WBC: 7 10*3/uL (ref 4.0–10.5)
nRBC: 0 % (ref 0.0–0.2)

## 2021-05-22 LAB — DIFFERENTIAL
Abs Immature Granulocytes: 0.02 10*3/uL (ref 0.00–0.07)
Basophils Absolute: 0.1 10*3/uL (ref 0.0–0.1)
Basophils Relative: 1 %
Eosinophils Absolute: 0 10*3/uL (ref 0.0–0.5)
Eosinophils Relative: 1 %
Immature Granulocytes: 0 %
Lymphocytes Relative: 18 %
Lymphs Abs: 1.3 10*3/uL (ref 0.7–4.0)
Monocytes Absolute: 0.6 10*3/uL (ref 0.1–1.0)
Monocytes Relative: 9 %
Neutro Abs: 5 10*3/uL (ref 1.7–7.7)
Neutrophils Relative %: 71 %

## 2021-05-22 LAB — LIPID PANEL
Cholesterol: 163 mg/dL (ref 0–200)
HDL: 47 mg/dL (ref >40)
LDL Cholesterol: 106 mg/dL — ABNORMAL HIGH (ref 0–99)
Total CHOL/HDL Ratio: 3.5 RATIO
Triglycerides: 49 mg/dL (ref <150)
VLDL: 10 mg/dL (ref 0–40)

## 2021-05-22 LAB — URINALYSIS, ROUTINE W REFLEX MICROSCOPIC
Bacteria, UA: NONE SEEN
Bilirubin Urine: NEGATIVE
Glucose, UA: >=500 mg/dL — AB
Hgb urine dipstick: NEGATIVE
Ketones, ur: 20 mg/dL — AB
Leukocytes,Ua: NEGATIVE
Nitrite: NEGATIVE
Protein, ur: NEGATIVE mg/dL
Specific Gravity, Urine: 1.023 (ref 1.005–1.030)
pH: 8 (ref 5.0–8.0)

## 2021-05-22 LAB — COMPREHENSIVE METABOLIC PANEL
ALT: 14 U/L (ref 0–44)
AST: 24 U/L (ref 15–41)
Albumin: 3.7 g/dL (ref 3.5–5.0)
Alkaline Phosphatase: 46 U/L (ref 38–126)
Anion gap: 8 (ref 5–15)
BUN: 19 mg/dL (ref 8–23)
CO2: 23 mmol/L (ref 22–32)
Calcium: 9.5 mg/dL (ref 8.9–10.3)
Chloride: 108 mmol/L (ref 98–111)
Creatinine, Ser: 0.97 mg/dL (ref 0.61–1.24)
GFR, Estimated: >60 mL/min (ref >60)
Glucose, Bld: 151 mg/dL — ABNORMAL HIGH (ref 70–99)
Potassium: 4.2 mmol/L (ref 3.5–5.1)
Sodium: 139 mmol/L (ref 135–145)
Total Bilirubin: 0.8 mg/dL (ref 0.3–1.2)
Total Protein: 6.5 g/dL (ref 6.5–8.1)

## 2021-05-22 LAB — I-STAT CHEM 8, ED
BUN: 22 mg/dL (ref 8–23)
Calcium, Ion: 1.18 mmol/L (ref 1.15–1.40)
Chloride: 105 mmol/L (ref 98–111)
Creatinine, Ser: 0.8 mg/dL (ref 0.61–1.24)
Glucose, Bld: 155 mg/dL — ABNORMAL HIGH (ref 70–99)
HCT: 40 % (ref 39.0–52.0)
Hemoglobin: 13.6 g/dL (ref 13.0–17.0)
Potassium: 4 mmol/L (ref 3.5–5.1)
Sodium: 139 mmol/L (ref 135–145)
TCO2: 23 mmol/L (ref 22–32)

## 2021-05-22 LAB — GLUCOSE, CAPILLARY: Glucose-Capillary: 123 mg/dL — ABNORMAL HIGH (ref 70–99)

## 2021-05-22 LAB — ECHOCARDIOGRAM COMPLETE
Area-P 1/2: 3.6 cm2
Calc EF: 64 %
Height: 72 in
S' Lateral: 3 cm
Single Plane A2C EF: 64.6 %
Single Plane A4C EF: 64.1 %
Weight: 2850.11 oz

## 2021-05-22 LAB — CBG MONITORING, ED: Glucose-Capillary: 169 mg/dL — ABNORMAL HIGH (ref 70–99)

## 2021-05-22 LAB — PROTIME-INR
INR: 1 (ref 0.8–1.2)
Prothrombin Time: 13.2 seconds (ref 11.4–15.2)

## 2021-05-22 LAB — HEMOGLOBIN A1C
Hgb A1c MFr Bld: 7.7 % — ABNORMAL HIGH (ref 4.8–5.6)
Mean Plasma Glucose: 174.29 mg/dL

## 2021-05-22 LAB — APTT: aPTT: 27 seconds (ref 24–36)

## 2021-05-22 LAB — TSH: TSH: 0.99 u[IU]/mL (ref 0.350–4.500)

## 2021-05-22 MED ORDER — PERFLUTREN LIPID MICROSPHERE
1.0000 mL | INTRAVENOUS | Status: AC | PRN
  Administered 2021-05-22: 2 mL via INTRAVENOUS
  Filled 2021-05-22: qty 10

## 2021-05-22 MED ORDER — LEVOTHYROXINE SODIUM 88 MCG PO TABS
88.0000 ug | ORAL_TABLET | Freq: Every day | ORAL | Status: DC
  Administered 2021-05-23 – 2021-05-25 (×3): 88 ug via ORAL
  Filled 2021-05-22 (×4): qty 1

## 2021-05-22 MED ORDER — DOXAZOSIN MESYLATE 4 MG PO TABS
4.0000 mg | ORAL_TABLET | Freq: Every day | ORAL | Status: DC
  Administered 2021-05-22 – 2021-05-25 (×4): 4 mg via ORAL
  Filled 2021-05-22 (×4): qty 1

## 2021-05-22 MED ORDER — ACETAMINOPHEN 650 MG RE SUPP: 650.0000 mg | RECTAL | Status: DC | PRN

## 2021-05-22 MED ORDER — SODIUM CHLORIDE 0.9 % IV SOLN: INTRAVENOUS | Status: DC

## 2021-05-22 MED ORDER — MELATONIN 3 MG PO TABS
1.5000 mg | ORAL_TABLET | Freq: Every day | ORAL | Status: DC
  Administered 2021-05-22 – 2021-05-24 (×3): 1.5 mg via ORAL
  Filled 2021-05-22 (×3): qty 1

## 2021-05-22 MED ORDER — HYDRALAZINE HCL 20 MG/ML IJ SOLN
10.0000 mg | INTRAMUSCULAR | Status: DC | PRN
Start: 2021-05-22 — End: 2021-05-25

## 2021-05-22 MED ORDER — ALLOPURINOL 100 MG PO TABS
300.0000 mg | ORAL_TABLET | Freq: Every day | ORAL | Status: DC
  Administered 2021-05-22 – 2021-05-25 (×4): 300 mg via ORAL
  Filled 2021-05-22: qty 3
  Filled 2021-05-22: qty 1
  Filled 2021-05-22 (×2): qty 3

## 2021-05-22 MED ORDER — EZETIMIBE 10 MG PO TABS
10.0000 mg | ORAL_TABLET | Freq: Every day | ORAL | Status: DC
  Administered 2021-05-22 – 2021-05-25 (×4): 10 mg via ORAL
  Filled 2021-05-22 (×5): qty 1

## 2021-05-22 MED ORDER — ENOXAPARIN SODIUM 40 MG/0.4ML IJ SOSY
40.0000 mg | PREFILLED_SYRINGE | INTRAMUSCULAR | Status: DC
  Administered 2021-05-22 – 2021-05-24 (×3): 40 mg via SUBCUTANEOUS
  Filled 2021-05-22 (×3): qty 0.4

## 2021-05-22 MED ORDER — ASPIRIN EC 81 MG PO TBEC
81.0000 mg | DELAYED_RELEASE_TABLET | Freq: Every day | ORAL | Status: DC
  Administered 2021-05-22 – 2021-05-25 (×4): 81 mg via ORAL
  Filled 2021-05-22 (×4): qty 1

## 2021-05-22 MED ORDER — CLOPIDOGREL BISULFATE 75 MG PO TABS
75.0000 mg | ORAL_TABLET | Freq: Every day | ORAL | Status: DC
  Administered 2021-05-23 – 2021-05-25 (×3): 75 mg via ORAL
  Filled 2021-05-22 (×3): qty 1

## 2021-05-22 MED ORDER — MELATONIN 10 MG PO TABS: 1.0000 | ORAL_TABLET | Freq: Every day | ORAL | Status: DC

## 2021-05-22 MED ORDER — CLOPIDOGREL BISULFATE 300 MG PO TABS
300.0000 mg | ORAL_TABLET | Freq: Once | ORAL | Status: AC
  Administered 2021-05-22: 300 mg via ORAL
  Filled 2021-05-22: qty 1

## 2021-05-22 MED ORDER — EMPAGLIFLOZIN 25 MG PO TABS
25.0000 mg | ORAL_TABLET | Freq: Every day | ORAL | Status: DC
  Administered 2021-05-22 – 2021-05-25 (×4): 25 mg via ORAL
  Filled 2021-05-22 (×4): qty 1

## 2021-05-22 MED ORDER — ACETAMINOPHEN 160 MG/5ML PO SOLN: 650.0000 mg | ORAL | Status: DC | PRN

## 2021-05-22 MED ORDER — SODIUM CHLORIDE 0.9% FLUSH
3.0000 mL | Freq: Once | INTRAVENOUS | Status: AC
  Administered 2021-05-22: 3 mL via INTRAVENOUS

## 2021-05-22 MED ORDER — ACETAMINOPHEN 325 MG PO TABS: 650.0000 mg | ORAL_TABLET | ORAL | Status: DC | PRN

## 2021-05-22 MED ORDER — STROKE: EARLY STAGES OF RECOVERY BOOK
Freq: Once | Status: AC
  Filled 2021-05-22 (×2): qty 1

## 2021-05-22 NOTE — ED Notes (Signed)
Pt transported to MRI 

## 2021-05-22 NOTE — ED Triage Notes (Signed)
EMS reports they were called out to pt's home yesterday for fall. After fall pt was [facility] and ambulatory in home. Pt denies injury at the time. Call today was due to bilateral leg weakness and pt's inability to get out of bed. Pt and EMS reported some slurred speech. Pt reports legs feel "heavy." Denies pain. Significant PMHx.  ?

## 2021-05-22 NOTE — H&P (Signed)
?History and Physical  ? ? ?PatientHolton Rubio TFT:[id number] DOB: Aug 06, 1931 ?DOA: 05/22/2021 ?DOS: the patient was seen and examined on 05/22/2021 ?PCP: Alan Dials, MD  ?Patient coming from: Home via EMS ? ?Chief Complaint:  ?Chief Complaint  ?Patient presents with  ? Code Stroke  ? ?HPI: Alan Rubio is a 88 y.o. male with medical history significant of hypertension, hyperlipidemia, atrial fibrillation, CVA in 2022, hypothyroidism, and DM type II presents due to weakness and confusion.  Patient's stepson is present at bedside and helps provide additional history.  Yesterday, the patient had fallen.  EMS was called and able to help the patient get up and no functional deficit was noted at that time.  This morning around 9 AM patient was unable to get out of bed for which EMS was called.  After helping the patient get up he was able to walk without aid issue or focal deficit appreciated.   However, by noon time patient progressively declined with complaints of bilateral leg weakness which patient thinks his right leg is weaker than his left, slurred speech, and reports of confusion. ? ?Upon admission into the emergency department patient was noted to be afebrile with blood pressures elevated up to 183/77, and all other vital signs stable.  Labs were unremarkable.  Chest x-ray noted no acute abnormality.  MRI of the brain significant for acute perforator infarct of the parasagittal left pons with chronic infarcts and chronic microvascular ischemic changes appreciated.  TRH called to admit for completion of stroke work-up. ? ?Review of Systems: As mentioned in the history of present illness. All other systems reviewed and are negative. ?Past Medical History:  ?Diagnosis Date  ? A-fib ([place])   ? Cancer [facility])   ? Diabetes mellitus without complication ([place])   ? Encounter for loop recorder check 09/15/2018  ? H/O: stroke 04/14/2018  ? Hyperlipemia 06/13/2018  ? Hypertension   ? Hypothyroidism 06/13/2018  ? Loop recorder  Biotronik in situ for cryptogenic stroke 06/16/2018  ? Remote loop recorder check  9.15.20: 1 false AF=SB/1st deg AVB/PACs  ? Stroke [facility])   ? Thyroid disease   ? ?Past Surgical History:  ?Procedure Laterality Date  ? BACK SURGERY    ? LOOP RECORDER IMPLANT    ? ?Social History:  reports that he has never smoked. He has never used smokeless tobacco. He reports current alcohol use. He reports that he does not use drugs. ? ?Allergies  ?Allergen Reactions  ? Penicillins Rash  ?  Did it involve swelling of the face/tongue/throat, SOB, or low BP? Yes ?Did it involve sudden or severe rash/hives, skin peeling, or any reaction on the inside of your mouth or nose? No ?Did you need to seek medical attention at a hospital or doctor's office? No ?When did it last happen?       ?If all above answers are "NO", may proceed with cephalosporin use. ? ? ?  ? ? ?Family History  ?Problem Relation Age of Onset  ? Stroke Mother   ? Aneurysm Father   ? Cancer Brother   ? ? ?Prior to Admission medications   ?Medication Sig Start Date End Date Taking? Authorizing Provider  ?ACCU-CHEK AVIVA PLUS test strip daily. 08/17/19   [provider]  ?allopurinol (ZYLOPRIM) 300 MG tablet Take 300 mg by mouth daily.    [provider]  ?aspirin EC 81 MG tablet Take 81 mg by mouth daily.    [provider]  ?atorvastatin (LIPITOR) 10 MG tablet Take  10 mg by mouth daily.    [provider]  ?doxazosin (CARDURA) 8 MG tablet Take 8 mg by mouth daily. 1/2 QD    [provider]  ?ezetimibe (ZETIA) 10 MG tablet Take 10 mg by mouth daily.    [provider]  ?JARDIANCE 10 MG TABS tablet Take 10 mg by mouth daily. 04/09/19   [provider]  ?levothyroxine (SYNTHROID, LEVOTHROID) 75 MCG tablet Take 75 mcg by mouth daily before breakfast.    [provider]  ?losartan (COZAAR) 25 MG tablet Take 25 mg by mouth daily. 02/09/18   [provider]  ?pioglitazone (ACTOS) 15 MG tablet Take 15  mg by mouth daily.    [provider]  ?Polyethyl Glycol-Propyl Glycol 0.4-0.3 % SOLN Place 1 drop into both eyes 3 (three) times daily as needed (dry eyes).    [provider]  ? ? ?Physical Exam: ?Vitals:  ? 05/22/21 1300 05/22/21 1318 05/22/21 1326 05/22/21 1501  ?BP:    (!) 183/77  ?Pulse:  68  60  ?Resp:  16  15  ?Temp:  98.5 ?F (36.9 ?C)    ?TempSrc:  Oral    ?SpO2:  100%  100%  ?Weight: 80.8 kg     ?Height:   6' (1.829 m)   ? ? ?Constitutional: Elderly male currently in no acute distress ?Eyes: PERRL, lids and conjunctivae normal ?ENMT: Mucous membranes are moist. Posterior pharynx clear of any exudate or lesions. Hard of hearing with hearing aids in place ?Neck: normal, supple, no masses,  ?Respiratory: clear to auscultation bilaterally, no wheezing, no crackles. Normal respiratory effort. No accessory muscle use.  ?Cardiovascular: Regular rate and rhythm, no murmurs / rubs / gallops. No extremity edema.   ?Abdomen: no tenderness, no masses palpated. No hepatosplenomegaly. Bowel sounds positive.  ?Musculoskeletal: no clubbing / cyanosis. No joint deformity upper and lower extremities. Good ROM, no contractures. Normal muscle tone.  ?Skin: no rashes, lesions, ulcers. No induration ?Neurologic: CN 2-12 grossly intact. Sensation intact, DTR normal. Strength 4/5 of the RLE. 5/5 in all other extremities. ?Psychiatric: Normal judgment and insight. Alert and oriented x 3. Normal mood.  ? ?Data Reviewed: ? 6 ?Signed rhythm at 68 bpm ? ?Assessment and Plan: ?CVA ?Acute.  Patient presented with complaints of weakness.  MRI brain significant for acute perforator infarct of the parasagittal left pons with chronic infarcts and chronic microvascular ischemic changes appreciate.  Echocardiogram revealed EF of 60 to 65% with grade 1 diastolic dysfunction without clear intracardiac source of embolization. ?-Admit to a telemetry bed ?-Stroke order set utilized ?-Check hemoglobin A1c and lipid  panel ?-Interrogate loop recorder ?-PT/OT/speech to eval and treat ?-Continue aspirin, Zetia, and add Plavix per neurology recommendation ?-Transitions of care consulted ?-[place] neurology consultative services, we will follow-up for any further recommendations ? ?Fall at home ?Patient had a fall at home yesterday.  Denies having any injuries related to fall. ?-PT/OT to eval and treat ? ?Loop recorder in place ?Initially placed in 05/2018 after patient had cryptogenic stroke. ?-Orders placed to have loop recorder interrogated ? ?Diabetes mellitus type 2 ?Home medication regimen includes Jardiance 25 mg daily and Actos 15 mg daily. ?-Continue Jardiance ?-Held Actos ? ?Hyperlipidemia ?Home medication regimen includes Zetia 10 mg daily ?-Goal LDL less than 70 ?-Continue Zetia ? ?Essential hypertension ?Home medication regimen includes losartan '50mg'$  daily. ?-Hydralazine IV as needed for systolic blood pressure greater than 086 or diastolic greater than 761 ?-Resume losartan when medically appropriate ? ?Hypothyroidism ?-  Check TSH ?-Continue levothyroxine and adjust dose if needed ? ? ?Advance Care Planning:   Code Status: Full Code  ? ?Consults: Neurology ? ?Family Communication: [place] updated at bedside ? ?Severity of Illness: ?The appropriate patient status for this patient is OBSERVATION. Observation status is judged to be reasonable and necessary in order to provide the required intensity of service to ensure the patient's safety. The patient's presenting symptoms, physical exam findings, and initial radiographic and laboratory data in the context of their medical condition is felt to place them at decreased risk for further clinical deterioration. Furthermore, it is anticipated that the patient will be medically stable for discharge from the hospital within 2 midnights of admission.  ? ?Author: ?Norval Morton, MD ?05/22/2021 4:19 PM ? ?For on call review [email or web address].  ?

## 2021-05-22 NOTE — ED Provider Notes (Signed)
?[place] ?Provider Note ? ? ?CSN: [id number] ?Arrival date & time: 05/22/21  1253 ? ?An emergency department physician performed an initial assessment on this suspected stroke patient at 1254. ? ?History ? ?Chief Complaint  ?Patient presents with  ? Code Stroke  ? ? ?Alan Rubio is a 88 y.o. male. ? ?Pt is an 88y/o male with hx of diabetes mellitus, hypertension,  hypothyroidism, stroke on 04/13/2018 with multiple bilateral infarcts including left thalamic and scattered right frontal and small hemorrhagic conversion and right MCA/ACA and left MCA/PCA with embolic etiology secondary to unknown source hence has a Biotronik loop recorder implanted on 06/16/2018 which showed no abnormal activity, CTA head and neck showed multifocal intracranial stenosis and  CT angiogram of the chest had also revealed three-vessel coronary calcification, presently on aggressive medical therapy who is presenting today for to the emergency room as a code stroke.  Pt did have a fall yesterday and patient initially had some trouble getting out of bed this morning and seemed weak and EMS was called out to the house.  When they arrived at the house at 930 patient was able to walk and was at baseline.  He did not need any help getting out of bed and walking around.  They did not want transport to the emergency room at that time and EMS left.  However at 1230 they were called back out because of patient having weakness in both lower extremities and some slurred speech.  EMS reports when they arrived patient was not able to get up at all or walk on his own.  This is different from when they saw him at 930 and he was at baseline and well.  Also they have noticed in route patient is starting to become a little more confused and speech is worsened.  His blood sugar was normal in route.  There is no report of nausea vomiting, diarrhea, chest pain or shortness of breath. ? ?The history is provided by the patient,  the EMS personnel and medical records.  ? ?  ? ?Home Medications ?Prior to Admission medications   ?Medication Sig Start Date End Date Taking? Authorizing Provider  ?ACCU-CHEK AVIVA PLUS test strip daily. 08/17/19   [provider]  ?allopurinol (ZYLOPRIM) 300 MG tablet Take 300 mg by mouth daily.    [provider]  ?aspirin EC 81 MG tablet Take 81 mg by mouth daily.    [provider]  ?atorvastatin (LIPITOR) 10 MG tablet Take 10 mg by mouth daily.    [provider]  ?doxazosin (CARDURA) 8 MG tablet Take 8 mg by mouth daily. 1/2 QD    [provider]  ?ezetimibe (ZETIA) 10 MG tablet Take 10 mg by mouth daily.    [provider]  ?JARDIANCE 10 MG TABS tablet Take 10 mg by mouth daily. 04/09/19   [provider]  ?levothyroxine (SYNTHROID, LEVOTHROID) 75 MCG tablet Take 75 mcg by mouth daily before breakfast.    [provider]  ?losartan (COZAAR) 25 MG tablet Take 25 mg by mouth daily. 02/09/18   [provider]  ?pioglitazone (ACTOS) 15 MG tablet Take 15 mg by mouth daily.    [provider]  ?Polyethyl Glycol-Propyl Glycol 0.4-0.3 % SOLN Place 1 drop into both eyes 3 (three) times daily as needed (dry eyes).    [provider]  ?   ? ?Allergies    ?Penicillins   ? ?Review of Systems   ?Review  of Systems ? ?Physical Exam ?Updated Vital Signs ?Pulse 68   Temp 98.5 ?F (36.9 ?C) (Oral)   Resp 16   Ht 6' (1.829 m)   Wt 80.8 kg   SpO2 100%   BMI 24.16 kg/m?  ?Physical Exam ?Vitals and nursing note reviewed.  ?Constitutional:   ?   General: He is not in acute distress. ?   Appearance: He is well-developed.  ?HENT:  ?   Head: Normocephalic and atraumatic.  ?   Mouth/Throat:  ?   Mouth: Mucous membranes are moist.  ?Eyes:  ?   Conjunctiva/sclera: Conjunctivae normal.  ?   Pupils: Pupils are equal, round, and reactive to light.  ?Cardiovascular:  ?   Rate and Rhythm: Normal rate and regular rhythm.  ?   Heart sounds: No  murmur heard. ?Pulmonary:  ?   Effort: Pulmonary effort is normal. No respiratory distress.  ?   Breath sounds: Normal breath sounds. No wheezing or rales.  ?Abdominal:  ?   General: There is no distension.  ?   Palpations: Abdomen is soft.  ?   Tenderness: There is no abdominal tenderness. There is no guarding or rebound.  ?Musculoskeletal:     ?   General: No tenderness. Normal range of motion.  ?   Cervical back: Normal range of motion and neck supple.  ?Skin: ?   General: Skin is warm and dry.  ?   Findings: No erythema or rash.  ?Neurological:  ?   Mental Status: He is alert and oriented to person, place, and time.  ?   Comments: Patient is hard of hearing but will follow commands.  5/5 strength in bilateral lower ext without drift.  Upper ext 5/5 and no drift.  Sensation intact.  Speech is normal.  Mental status normal.    ?Psychiatric:     ?   Behavior: Behavior normal.  ? ? ?ED Results / Procedures / Treatments   ?Labs ?(all labs ordered are listed, but only abnormal results are displayed) ?Labs Reviewed  ?COMPREHENSIVE METABOLIC PANEL - Abnormal; Notable for the following components:  ?    Result Value  ? Glucose, Bld 151 (*)   ? All other components within normal limits  ?I-STAT CHEM 8, ED - Abnormal; Notable for the following components:  ? Glucose, Bld 155 (*)   ? All other components within normal limits  ?CBG MONITORING, ED - Abnormal; Notable for the following components:  ? Glucose-Capillary 169 (*)   ? All other components within normal limits  ?PROTIME-INR  ?APTT  ?CBC  ?DIFFERENTIAL  ?URINALYSIS, ROUTINE W REFLEX MICROSCOPIC  ? ? ?EKG ?None ? ?Radiology ?DG Chest Port 1 View ? ?Result Date: 05/22/2021 ?CLINICAL DATA:  Weakness.  Code stroke. EXAM: PORTABLE CHEST 1 VIEW COMPARISON:  04/17/2018 FINDINGS: Loop recorder is identified in the projection of the left heart. Bilateral calcified pleural plaques are again noted. No pleural effusion, interstitial edema or airspace consolidation. Osseous  structures are unremarkable. IMPRESSION: No acute cardiopulmonary abnormalities. 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   On: 05/22/2021 13:59  ? ?CT HEAD CODE STROKE WO CONTRAST ? ?Result Date: 05/22/2021 ?CLINICAL DATA:  Code stroke.  Neuro deficit, acute, stroke suspected EXAM: CT HEAD WITHOUT CONTRAST TECHNIQUE: Contiguous axial images were obtained from the base of the skull through the vertex without intravenous contrast. RADIATION DOSE REDUCTION: This exam was performed according to the departmental dose-optimization program which includes  automated exposure control, adjustment of the mA and/or kV according to patient size and/or use of iterative reconstruction technique. COMPARISON:  CT head April 13, 2018. FINDINGS: Brain: Encephalomalacia in the region of prior hemorrhage in the right frontal lobe. Additional patchy white matter hypoattenuation, nonspecific but compatible with chronic microvascular ischemic disease. Remote lacunar infarct in the left thalamus. No evidence of acute large vascular territory infarct. No hydrocephalus, mass lesion, midline shift, or extra-axial fluid collection. Vascular: No hyperdense vessel identified. Skull: No acute fracture. Sinuses/Orbits: No acute finding. Other: None. ASPECTS [facility] Stroke Program Early CT Score) Total score (0-10 with 10 being normal): 10. IMPRESSION: 1. No acute intracranial abnormality.ASPECTS is 10 2. Sequela of prior hemorrhage in the right frontal lobe and chronic microvascular ischemic disease. Code stroke imaging results were communicated on 05/22/2021 at 1:20 pm to provider Dr. Erlinda Hong via telephone, who verbally acknowledged these results. Electronically Signed   By: Margaretha Sheffield M.D.   On: 05/22/2021 13:24   ? ?Procedures ?Procedures  ? ? ?Medications Ordered in ED ?Medications  ?perflutren lipid microspheres (DEFINITY) IV suspension (2 mLs Intravenous Given  05/22/21 1450)  ?sodium chloride flush (NS) 0.9 % injection 3 mL (3 mLs Intravenous Given 05/22/21 1343)  ? ? ?ED Course/ Medical Decision Making/ A&P ?  ?                        ?Medical Decision Making ?Amount an

## 2021-05-22 NOTE — Code Documentation (Signed)
Stroke Response Nurse Documentation ?Code Documentation ? ?Alan Rubio is a 88 y.o. male arriving to [facility]  via [facility] [place] [facility] on 05-22-2021 with past medical hx of CVA, DM. On No antithrombotic. Code stroke was activated by EMS.  ? ?Patient from home where he was LKW at [place] and now complaining of slurred speech and bilat leg weakness . Patient had a fall in the laundry room yesterday, was assisted up but then was his normal self.  This am EMS was called because he had trouble getting out of bed and standing because of bilateral leg weakness.  Per EMS while they were there he showed no weakness and he was his normal self.  They were called again: He had bilateral leg weakness, slurred speech and some confusion. ? ?Stroke team at the bedside on patient arrival. Labs drawn and patient cleared for CT by Dr. Maryan Rued. Patient to CT with team. NIHSS 2, see documentation for details and code stroke times. Patient with bilateral leg weakness on exam. The following imaging was completed:  CT Head. Patient is not a candidate for IV Thrombolytic due to symptoms resolved . Patient is not not a candidate for IR due to no LVO suspected. Symptoms resolved post CT now NIHSS 0 ? ?Care Plan: mNIHSS and VS q 2 hours.  ? ?Bedside handoff with ED RN Delana Meyer.   ? ?[name]  ?Stroke Response RN ?  ?

## 2021-05-22 NOTE — ED Notes (Signed)
Pt complaining of cramps to right let.  NS, ASA, and plavix given ?

## 2021-05-22 NOTE — Consult Note (Signed)
Stroke Neurology Consultation Note ? ?Consult Requested by: Dr. Maryan Rued ? ?Reason for Consult: Slurred speech and bilateral lower extremity weakness ? ?Consult Date: 05/22/21  ? ?The history was obtained from the EMS.  ? ?History of Present Illness:  Alan Rubio is a 88 y.o. Caucasian male with PMH of hypertension, hyperlipidemia, stroke in 2020 presented to ER for slurred speech and bilateral lower extremity weakness.  Per EMS, patient had a fall at home yesterday, but otherwise function at baseline.  This morning he had difficulty getting out of bed due to bilateral lower extremity weakness.  EMS was called, however on arrival, patient was able to walk with EMS without difficulty, no focal deficit seen.  Later around 12:30 PM EMS was called back because of patient having again both lower extremity weakness and slurred speech, not able to get out of bed again.  Patient was brought to ER for evaluation. ? ?Patient had a stroke in 03/2018, MRI showed multifocal infarcts, concerning for embolic source.  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  EF 60 to 65%.  Had loop recorder placed and so far no A-fib reported.  He was discharged with DAPT for 3 weeks and then aspirin alone and Lipitor 40 at that time.  LDL 84, A1c 7.9. ? ?LSN: 12:30 PM ?tPA Given: No: Symptoms largely resolved, NIH score 0. ?IR: Low NIH score and no signs of LVO ? ?Past Medical History:  ?Diagnosis Date  ? A-fib ([place])   ? Cancer [facility])   ? Diabetes mellitus without complication ([place])   ? Encounter for loop recorder check 09/15/2018  ? H/O: stroke 04/14/2018  ? Hyperlipemia 06/13/2018  ? Hypertension   ? Hypothyroidism 06/13/2018  ? 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 Remote loop recorder check  9.15.20: 1 false AF=SB/1st deg AVB/PACs  ? Stroke [facility])   ? Thyroid disease   ? ? ?Past Surgical History:  ?Procedure  Laterality Date  ? BACK SURGERY    ? LOOP RECORDER IMPLANT    ? ? ?Family History  ?Problem Relation Age of Onset  ? Stroke Mother   ? Aneurysm Father   ? Cancer Brother   ? ? ?Social History:  reports that he has never smoked. He has never used smokeless tobacco. He reports current alcohol use. He reports that he does not use drugs. ? ?Allergies:  ?Allergies  ?Allergen Reactions  ? Penicillins Rash  ?  Did it involve swelling of the face/tongue/throat, SOB, or low BP? Yes ?Did it involve sudden or severe rash/hives, skin peeling, or any reaction on the inside of your mouth or nose? No ?Did you need to seek medical attention at a hospital or doctor's office? No ?When did it last happen?       ?If all above answers are "NO", may proceed with cephalosporin use. ? ? ?  ? ? ?No current facility-administered medications on file prior to encounter.  ? ?Current Outpatient Medications on File Prior to Encounter  ?Medication Sig Dispense Refill  ? ACCU-CHEK AVIVA PLUS test strip daily.    ? allopurinol (ZYLOPRIM) 300 MG tablet Take 300 mg by mouth daily.    ? aspirin EC 81 MG tablet Take 81 mg by mouth daily.    ? atorvastatin (LIPITOR) 10 MG tablet Take 10 mg by mouth daily.    ? doxazosin (CARDURA) 8 MG  tablet Take 8 mg by mouth daily. 1/2 QD    ? ezetimibe (ZETIA) 10 MG tablet Take 10 mg by mouth daily.    ? JARDIANCE 10 MG TABS tablet Take 10 mg by mouth daily.    ? levothyroxine (SYNTHROID, LEVOTHROID) 75 MCG tablet Take 75 mcg by mouth daily before breakfast.    ? losartan (COZAAR) 25 MG tablet Take 25 mg by mouth daily.    ? pioglitazone (ACTOS) 15 MG tablet Take 15 mg by mouth daily.    ? Polyethyl Glycol-Propyl Glycol 0.4-0.3 % SOLN Place 1 drop into both eyes 3 (three) times daily as needed (dry eyes).    ? ? ?Review of Systems: A full ROS was attempted today and was able to be performed.  Systems assessed include - Constitutional, Eyes, HENT, Respiratory, Cardiovascular, Gastrointestinal, Genitourinary,  Integument/breast, Hematologic/lymphatic, Musculoskeletal, Neurological, Behavioral/Psych, Endocrine, Allergic/Immunologic - with pertinent responses as per HPI. ? ?Physical Examination: ?Temp:  [98.5 ?F (36.9 ?C)] 98.5 ?F (36.9 ?C) (04/28 1318) ?Pulse Rate:  [68] 68 (04/28 1318) ?Resp:  [16] 16 (04/28 1318) ?SpO2:  [100 %] 100 % (04/28 1318) ?Weight:  [80.8 kg] 80.8 kg (04/28 1300) ? ?General - well nourished, well developed, in no apparent distress.   ? ?Ophthalmologic - fundi not visualized due to noncooperation.   ? ?Cardiovascular - regular rhythm and rate ? ?Mental Status -  ?Level of arousal and orientation to time, place, and person were intact. ?Language including expression, naming, repetition, comprehension was assessed and found intact. ?Fund of Knowledge was assessed and was intact. ? ?Cranial Nerves II - XII - ?II - Vision intact OU. ?III, IV, VI - Extraocular movements intact. ?V - Facial sensation intact bilaterally. ?VII - Facial movement intact bilaterally. ?VIII - hard of hearing & vestibular intact bilaterally. ?X - Palate elevates symmetrically. ?XI - Chin turning & shoulder shrug intact bilaterally. ?XII - Tongue protrusion intact. ? ?Motor Strength - The patient?s strength was normal in all extremities and pronator drift was absent.  ? ?Motor Tone & Bulk - Muscle tone was assessed at the neck and appendages and was normal.  Bulk was normal and fasciculations were absent.  ? ?Reflexes - The patient?s reflexes were normal in all extremities and he had no pathological reflexes. ? ?Sensory - Light touch, temperature/pinprick were assessed and were normal.   ? ?Coordination - The patient had normal movements in the hands with no ataxia or dysmetria.  Tremor was absent. ? ?Gait and Station - deferred ? ?Data Reviewed: ?CT HEAD CODE STROKE WO CONTRAST ? ?Result Date: 05/22/2021 ?CLINICAL DATA:  Code stroke.  Neuro deficit, acute, stroke suspected EXAM: CT HEAD WITHOUT CONTRAST TECHNIQUE: Contiguous  axial images were obtained from the base of the skull through the vertex without intravenous contrast. RADIATION DOSE REDUCTION: This exam was performed according to the departmental dose-optimization program which includes automated exposure control, adjustment of the mA and/or kV according to patient size and/or use of iterative reconstruction technique. COMPARISON:  CT head April 13, 2018. FINDINGS: Brain: Encephalomalacia in the region of prior hemorrhage in the right frontal lobe. Additional patchy white matter hypoattenuation, nonspecific but compatible with chronic microvascular ischemic disease. Remote lacunar infarct in the left thalamus. No evidence of acute large vascular territory infarct. No hydrocephalus, mass lesion, midline shift, or extra-axial fluid collection. Vascular: No hyperdense vessel identified. Skull: No acute fracture. Sinuses/Orbits: No acute finding. Other: None. ASPECTS [facility] Stroke Program Early CT Score) Total score (0-10 with 10 being normal): 10.  IMPRESSION: 1. No acute intracranial abnormality.ASPECTS is 10 2. Sequela of prior hemorrhage in the right frontal lobe and chronic microvascular ischemic disease. Code stroke imaging results were communicated on 05/22/2021 at 1:20 pm to provider Dr. Erlinda Hong via telephone, who verbally acknowledged these results. Electronically Signed   By: Margaretha Sheffield M.D.   On: 05/22/2021 13:24   ? ?Assessment: 88 y.o. male with PMH of hypertension, hyperlipidemia, stroke in 2020 presented to ER for slurred speech and bilateral lower extremity weakness.  Time onset 12:30 PM, however on ER arrival, patient symptoms largely resolved, NIH score 0.  CT no acute abnormality, old right frontal chronic infarct.  Patient not a tPA candidate given symptoms largely resolved with low NIH score.  Not IR candidate given low NIH score and no LVO sign.  Will continue stroke work-up with MRI, MRA, carotid Doppler.  Will do loop recorder interrogation.  Recommend  intermittent admission. ? ?Stroke Risk Factors - diabetes mellitus, hyperlipidemia, and hypertension ? ?Plan: ?-Interventional admission for further stroke work-up ?-MRI, MRA, carotid Doppler, 2D echo ?-Loop recorde

## 2021-05-22 NOTE — Progress Notes (Signed)
?  Echocardiogram ?2D Echocardiogram has been performed. ? ?Alan Rubio ?05/22/2021, 2:50 PM ?

## 2021-05-23 ENCOUNTER — Inpatient Hospital Stay (HOSPITAL_COMMUNITY): Payer: Medicare Other

## 2021-05-23 DIAGNOSIS — I6302 Cerebral infarction due to thrombosis of basilar artery: Secondary | ICD-10-CM | POA: Diagnosis not present

## 2021-05-23 DIAGNOSIS — E039 Hypothyroidism, unspecified: Secondary | ICD-10-CM | POA: Diagnosis not present

## 2021-05-23 DIAGNOSIS — E7800 Pure hypercholesterolemia, unspecified: Secondary | ICD-10-CM

## 2021-05-23 DIAGNOSIS — G459 Transient cerebral ischemic attack, unspecified: Secondary | ICD-10-CM

## 2021-05-23 DIAGNOSIS — E1159 Type 2 diabetes mellitus with other circulatory complications: Secondary | ICD-10-CM | POA: Diagnosis not present

## 2021-05-23 DIAGNOSIS — I10 Essential (primary) hypertension: Secondary | ICD-10-CM

## 2021-05-23 LAB — CBC
HCT: 38.5 % — ABNORMAL LOW (ref 39.0–52.0)
Hemoglobin: 13.2 g/dL (ref 13.0–17.0)
MCH: 31.5 pg (ref 26.0–34.0)
MCHC: 34.3 g/dL (ref 30.0–36.0)
MCV: 91.9 fL (ref 80.0–100.0)
Platelets: 202 10*3/uL (ref 150–400)
RBC: 4.19 MIL/uL — ABNORMAL LOW (ref 4.22–5.81)
RDW: 13 % (ref 11.5–15.5)
WBC: 5.9 10*3/uL (ref 4.0–10.5)
nRBC: 0 % (ref 0.0–0.2)

## 2021-05-23 LAB — GLUCOSE, CAPILLARY: Glucose-Capillary: 106 mg/dL — ABNORMAL HIGH (ref 70–99)

## 2021-05-23 MED ORDER — INSULIN ASPART 100 UNIT/ML IJ SOLN
0.0000 [IU] | Freq: Three times a day (TID) | INTRAMUSCULAR | Status: DC
  Administered 2021-05-24: 3 [IU] via SUBCUTANEOUS
  Administered 2021-05-25: 2 [IU] via SUBCUTANEOUS
  Administered 2021-05-25: 1 [IU] via SUBCUTANEOUS

## 2021-05-23 MED ORDER — ATORVASTATIN CALCIUM 40 MG PO TABS
40.0000 mg | ORAL_TABLET | Freq: Every day | ORAL | Status: DC
Start: 2021-05-23 — End: 2021-05-25
  Administered 2021-05-23 – 2021-05-25 (×3): 40 mg via ORAL
  Filled 2021-05-23 (×3): qty 1

## 2021-05-23 NOTE — Progress Notes (Signed)
?PROGRESS NOTE ? ? ? Alan Rubio  NFA:[id number] DOB: 10/27/1931 DOA: 05/22/2021 ?PCP: [name], MD  ? ? ?Chief Complaint  ?Patient presents with  ? Code Stroke  ? ? ?Brief Narrative: ?Patient 88 year old gentleman history of hypertension, hyperlipidemia, history of CVA 2022, hypothyroidism, type 2 diabetes presenting with confusion, lower extremity weakness, slurred speech.  Patient seen in the ED blood pressure is noted to be elevated at 183/77, chest x-ray no acute abnormality, MRI brain with acute perforator infarct of the parasagittal left pons with chronic infarcts and chronic microvascular ischemic changes.  Patient assessed by neurology recommended admission for stroke work-up.  Patient maintained on home regimen aspirin and Plavix added.  Stroke work-up underway.  Neurology following. ? ? ?Assessment & Plan: ?  ?Principal Problem: ?  CVA (cerebral vascular accident) ([place]) ?Active Problems: ?  HTN (hypertension) ?  Type 2 diabetes mellitus ([place]) ?  Hyperlipemia ?  Hypothyroidism ?  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 ? ?#1 acute CVA/left pontine infarct likely secondary to small vessel disease ?-Patient presented with lower extremity weakness, dysarthric speech. ?-Head CT done negative for any acute abnormalities. 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 ?-MRA with stenosis of left P1 PCA. ?-Carotid Dopplers done with no significant ICA stenosis. ?-2D echo done with EF of 60 to 46%, grade 1 diastolic dysfunction, no source of emboli noted. ?-LDL of 106.  Hemoglobin A1c 7.7. ?-Permissive hypertension ?-Patient noted to have been on aspirin 81 mg daily prior to admission. ?-Patient seen in consultation by neurology who recommended dual antiplatelet therapy with aspirin and Plavix x3 weeks then Plavix alone. ?-Patient noted to have had a loop recorder which was interrogated and revealed no atrial fibrillation. ?-Patient started on a statin.  Continue  Zetia.  ?-Patient seen by PT who are recommending SNF placement. ?-OT/SLP pending. ?-Neurology following and appreciate input and recommendations. ? ?2.  Diabetes mellitus type 2 ?-Hemoglobin A1c 7.7 (05/22/2021). ?-CBG 106 this morning. ?-Continue to hold Actos. ?-Continue Jardiance. ?-Place on sliding scale insulin. ? ?3.  Hyperlipidemia ?-LDL of 106. ?-Noted to be on Zetia prior to admission which has been continued. ?-Patient started on statin. ? ?4.  Hypothyroidism ?-TSH 0.990.   ?-Continue home regimen Synthroid. ? ?5.  Fall at home ?-Per admitting physician patient noted to have had a fall prior to admission at home. ?-PT/OT. ? ?DVT prophylaxis: Lovenox ?Code Status: Full ?Family Communication: Updated wife, stepson, daughter-in-law at bedside ?Disposition: TBD ? ?Status is: Inpatient ?Remains inpatient appropriate because: Severity of illness ?  ?Consultants:  ?Neurology: Dr.Xu 05/22/2021 ? ?Procedures:  ?Chest x-ray 05/22/2021 ?MRI brain/MRA head 05/22/2021 ?2D echo 05/22/2021 ?Carotid Dopplers 05/23/2021 ? ? ? ?Antimicrobials:  ?None ? ? ?Subjective: ?Laying in bed.  Family at bedside.  Denies any chest pain.  No shortness of breath.  No abdominal pain.  Still with dysarthric speech.  Overall feels a little bit better than on admission. ? ?Objective: ?Vitals:  ? 05/23/21 0349 05/23/21 0410 05/23/21 0809 05/23/21 1136  ?BP: (!) 149/72 (!) 171/70 (!) 141/69 (!) 145/71  ?Pulse: 63 62 70 82  ?Resp: '16 16 16 18  '$ ?Temp: 97.9 ?F (36.6 ?C) 98 ?F (36.7 ?C) (!) 97.5 ?F (36.4 ?C) (!) 97.5 ?F (36.4 ?C)  ?TempSrc: Oral  Oral Oral  ?SpO2: 100% 100% 97% 100%  ?Weight:      ?Height:      ? ?No intake or output data in the 24 hours ending 05/23/21 1217 ?  Filed Weights  ? 05/22/21 1300  ?Weight: 80.8 kg  ? ? ?Examination: ? ?General exam: NAD.  Dysarthric speech. ?Respiratory system: Clear to auscultation. Respiratory effort normal. ?Cardiovascular system: S1 & S2 heard, RRR. No JVD, murmurs, rubs, gallops or clicks. No pedal  edema. ?Gastrointestinal system: Abdomen is nondistended, soft and nontender. No organomegaly or masses felt. Normal bowel sounds heard. ?Central nervous system: Alert and oriented.  Dysarthric speech.  Moving extremities spontaneously.  Sensation intact.  Gait not tested secondary to safety.   ?Extremities: Symmetric 5 x 5 power. ?Skin: No rashes, lesions or ulcers ?Psychiatry: Judgement and insight appear normal. Mood & affect appropriate.  ? ? ? ?Data Reviewed: I have personally reviewed following labs and imaging studies ? ?CBC: ?Recent Labs  ?Lab 05/22/21 ?1339 05/22/21 ?1346 05/23/21 ?0232  ?WBC 7.0  --  5.9  ?NEUTROABS 5.0  --   --   ?HGB 13.7 13.6 13.2  ?HCT 39.7 40.0 38.5*  ?MCV 92.8  --  91.9  ?PLT 208  --  202  ? ? ?Basic Metabolic Panel: ?Recent Labs  ?Lab 05/22/21 ?1339 05/22/21 ?1346  ?NA 139 139  ?K 4.2 4.0  ?CL 108 105  ?CO2 23  --   ?GLUCOSE 151* 155*  ?BUN 19 22  ?CREATININE 0.97 0.80  ?CALCIUM 9.5  --   ? ? ?GFR: ?Estimated Creatinine Clearance: 70.1 mL/min (by C-G formula based on SCr of 0.8 mg/dL). ? ?Liver Function Tests: ?Recent Labs  ?Lab 05/22/21 ?1339  ?AST 24  ?ALT 14  ?ALKPHOS 46  ?BILITOT 0.8  ?PROT 6.5  ?ALBUMIN 3.7  ? ? ?CBG: ?Recent Labs  ?Lab 05/22/21 ?1257 05/22/21 ?2205 05/23/21 ?6144  ?GLUCAP 169* 123* 106*  ? ? ? ?No results found for this or any previous visit (from the past 240 hour(s)).  ? ? ? ? ? ?Radiology Studies: ?MR ANGIO HEAD WO CONTRAST ? ?Result Date: 05/22/2021 ?CLINICAL DATA:  Transient ischemic attack (TIA) EXAM: MRI HEAD WITHOUT CONTRAST MRA HEAD WITHOUT CONTRAST TECHNIQUE: Multiplanar, multi-echo pulse sequences of the brain and surrounding structures were acquired without intravenous contrast. Angiographic images of the Circle of Willis were acquired using MRA technique without intravenous contrast. COMPARISON:  No pertinent prior exam. FINDINGS: MRI HEAD Brain: Approximately 1.8 cm area of reduced diffusion in the parasagittal left pons. Prominence of the  ventricles and sulci reflects stable to slightly increased parenchymal volume loss. Chronic right frontal cortical/subcortical infarct. Chronic bilateral deep gray nuclei infarcts. Additional patchy and confluent areas of T2 hyperintensity in the supratentorial white matter are nonspecific but probably reflect moderate chronic microvascular ischemic changes. No intracranial mass or mass effect. There is no hydrocephalus or extra-axial fluid collection. Vascular: Major vessel flow voids at the skull base are preserved. Skull and upper cervical spine: Normal marrow signal is preserved. Sinuses/Orbits: Paranasal sinuses are aerated. Bilateral lens replacements. Other: Sella is unremarkable.  Mastoid air cells are clear. MRA HEAD Intracranial internal carotid arteries are patent. Middle and anterior cerebral arteries are patent. Intracranial vertebral arteries, basilar artery, posterior cerebral arteries are patent. Marked stenosis is present at the proximal left P1 PCA. There is no aneurysm. IMPRESSION: Acute perforator infarct of the parasagittal left pons. Chronic infarcts and chronic microvascular ischemic changes. No proximal intracranial vessel occlusion. Marked stenosis of the proximal left P1 PCA. Electronically Signed   By: Macy Mis M.D.   On: 05/22/2021 16:05  ? ?MR BRAIN WO CONTRAST ? ?Result Date: 05/22/2021 ?CLINICAL DATA:  Transient ischemic attack (TIA) EXAM: MRI  HEAD WITHOUT CONTRAST MRA HEAD WITHOUT CONTRAST TECHNIQUE: Multiplanar, multi-echo pulse sequences of the brain and surrounding structures were acquired without intravenous contrast. Angiographic images of the Circle of Willis were acquired using MRA technique without intravenous contrast. COMPARISON:  No pertinent prior exam. FINDINGS: MRI HEAD Brain: Approximately 1.8 cm area of reduced diffusion in the parasagittal left pons. Prominence of the ventricles and sulci reflects stable to slightly increased parenchymal volume loss. Chronic  right frontal cortical/subcortical infarct. Chronic bilateral deep gray nuclei infarcts. Additional patchy and confluent areas of T2 hyperintensity in the supratentorial white matter are nonspecific but probably reflect

## 2021-05-23 NOTE — Evaluation (Signed)
Physical Therapy Evaluation ?Patient Details ?Name: Alan Rubio ?MRN: [id number] ?DOB: 1931/05/20 ?Today's Date: 05/23/2021 ? ?History of Present Illness ? Pt is an 88 y.o. male admitted 4/28 following a fall at home with c/o BLE weakness and slurred speech. 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  PMH:  hypertension, hyperlipidemia, atrial fibrillation, CVA in 2022, hypothyroidism, and DM type II ?  ?Clinical Impression ? Pt admitted with above diagnosis. PTA pt lived at home with wife, mod I mobility household distances. Pt currently with functional limitations due to the deficits listed below (see PT Problem List). On eval, he required mod assist bed mobility, mod assist transfers, and min assist ambulation 50' with RW. Pt will benefit from skilled PT to increase their independence and safety with mobility to allow discharge to the venue listed below.   ?   ?   ? ?Recommendations for follow up therapy are one component of a multi-disciplinary discharge planning process, led by the attending physician.  Recommendations may be updated based on patient status, additional functional criteria and insurance authorization. ? ?Follow Up Recommendations Skilled nursing-short term rehab (<3 hours/day) ? ?  ?Assistance Recommended at Discharge Frequent or constant Supervision/Assistance  ?Patient can return home with the following ? A little help with walking and/or transfers;A lot of help with bathing/dressing/bathroom;Assistance with cooking/housework;Assist for transportation;Help with stairs or ramp for entrance ? ?  ?Equipment Recommendations Rolling walker (2 wheels)  ?Recommendations for Other Services ?    ?  ?Functional Status Assessment Patient has had a recent decline in their functional status and demonstrates the ability to make significant improvements in function in a reasonable and predictable amount of time.  ? ?  ?Precautions / Restrictions Precautions ?Precautions: Fall  ? ?   ? ?Mobility ? Bed Mobility ?Overal bed mobility: Needs Assistance ?Bed Mobility: Supine to Sit ?  ?  ?Supine to sit: Mod assist, HOB elevated ?  ?  ?General bed mobility comments: +rail, increased time, use of bed pad to scoot to EOB ?  ? ?Transfers ?Overall transfer level: Needs assistance ?Equipment used: Rolling walker (2 wheels) ?Transfers: Sit to/from Stand ?Sit to Stand: Mod assist ?  ?  ?  ?  ?  ?General transfer comment: cues for hand placement, assist to power up ?  ? ?Ambulation/Gait ?Ambulation/Gait assistance: Min assist ?Gait Distance (Feet): 50 Feet ?Assistive device: Rolling walker (2 wheels) ?Gait Pattern/deviations: Step-through pattern, Decreased stride length, Drifts right/left ?Gait velocity: decreased ?Gait velocity interpretation: <1.31 ft/sec, indicative of household ambulator ?  ?General Gait Details: improved gait stability with RW ? ?Stairs ?  ?  ?  ?  ?  ? ?Wheelchair Mobility ?  ? ?Modified Rankin (Stroke Patients Only) ?Modified Rankin (Stroke Patients Only) ?Pre-Morbid Rankin Score: Slight disability ?Modified Rankin: Moderately severe disability ? ?  ? ?Balance Overall balance assessment: Needs assistance ?Sitting-balance support: Feet supported, Bilateral upper extremity supported ?Sitting balance-Leahy Scale: Fair ?  ?Postural control: Posterior lean ?Standing balance support: Bilateral upper extremity supported, During functional activity, Reliant on assistive device for balance ?Standing balance-Leahy Scale: Poor ?  ?  ?  ?  ?  ?  ?  ?  ?  ?  ?  ?  ?   ? ? ? ?Pertinent Vitals/Pain Pain Assessment ?Pain Assessment: No/denies pain  ? ? ?Home Living Family/patient expects to be discharged to:: Private residence ?Living Arrangements: Spouse/significant other ?Available Help at Discharge: Family;Available 24 hours/day ?Type of Home: House ?Home Access: Stairs to  enter ?Entrance Stairs-Rails: Right ?Entrance Stairs-Number of Steps: 2 ?Alternate Level Stairs-Number of Steps: Pt has a  chair lift on flight of stairs. ?Home Layout: Two level;Bed/bath upstairs ?Home Equipment: [name] - single point ?   ?  ?Prior Function Prior Level of Function : Independent/Modified Independent ?  ?  ?  ?  ?  ?  ?Mobility Comments: amb household distances, occasional use of cane, primarily furniture walks ?  ?  ? ? ?Hand Dominance  ? Dominant Hand: Right ? ?  ?Extremity/Trunk Assessment  ? Upper Extremity Assessment ?Upper Extremity Assessment: Defer to OT evaluation ?  ? ?Lower Extremity Assessment ?Lower Extremity Assessment: Generalized weakness;RLE deficits/detail ?RLE Deficits / Details: 4/5 grossly graded ?RLE Sensation: decreased proprioception ?  ? ?Cervical / Trunk Assessment ?Cervical / Trunk Assessment: Kyphotic  ?Communication  ? Communication: HOH  ?Cognition Arousal/Alertness: Awake/alert ?Behavior During Therapy: [facility] for tasks assessed/performed ?Overall Cognitive Status: Within Functional Limits for tasks assessed ?  ?  ?  ?  ?  ?  ?  ?  ?  ?  ?  ?  ?  ?  ?  ?  ?  ?  ?  ? ?  ?General Comments   ? ?  ?Exercises    ? ?Assessment/Plan  ?  ?PT Assessment Patient needs continued PT services  ?PT Problem List Decreased strength;Decreased mobility;Decreased activity tolerance;Decreased balance;Decreased knowledge of use of DME ? ?   ?  ?PT Treatment Interventions DME instruction;Therapeutic activities;Gait training;Therapeutic exercise;Patient/family education;Balance training;Stair training;Functional mobility training   ? ?PT Goals (Current goals can be found in the Care Plan section)  ?Acute Rehab PT Goals ?Patient Stated Goal: home ?PT Goal Formulation: With patient/family ?Time For Goal Achievement: 06/06/21 ?Potential to Achieve Goals: Good ? ?  ?Frequency Min 3X/week ?  ? ? ?Co-evaluation   ?  ?  ?  ?  ? ? ?  ?AM-PAC PT "6 Clicks" Mobility  ?Outcome Measure Help needed turning from your back to your side while in a flat bed without using bedrails?: A Little ?Help needed moving from lying on your back  to sitting on the side of a flat bed without using bedrails?: A Lot ?Help needed moving to and from a bed to a chair (including a wheelchair)?: A Lot ?Help needed standing up from a chair using your arms (e.g., wheelchair or bedside chair)?: A Lot ?Help needed to walk in hospital room?: A Little ?Help needed climbing 3-5 steps with a railing? : Total ?6 Click Score: 13 ? ?  ?End of Session Equipment Utilized During Treatment: Gait belt ?Activity Tolerance: Patient tolerated treatment well ?Patient left: in chair;with call bell/phone within reach;with chair alarm set;with family/visitor present ?Nurse Communication: Mobility status ?PT Visit Diagnosis: Other abnormalities of gait and mobility (R26.89);Difficulty in walking, not elsewhere classified (R26.2);Muscle weakness (generalized) (M62.81) ?  ? ?Time: 6283-1517 ?PT Time Calculation (min) (ACUTE ONLY): 41 min ? ? ?Charges:   PT Evaluation ?$PT Eval Moderate Complexity: 1 Mod ?PT Treatments ?$Gait Training: 23-37 mins ?  ?   ? ? ?Lorrin Goodell, PT  ?Office # [phone] ?Pager [phone] ? ? ?Lorriane Shire ?05/23/2021, 3:13 PM ? ?

## 2021-05-23 NOTE — Progress Notes (Signed)
Ms Alan Rubio of [place] [facility] called back, the reading on patient's loop recorder yesterday was transmitted to their remote monitoring from home recording system, no Afib was noted. She said if an in-person interrogated is needed they can be reach anytime at this number [phone]. ?

## 2021-05-23 NOTE — Progress Notes (Signed)
VASCULAR LAB ? ? ? ?Carotid duplex has been performed. ? ?See CV proc for preliminary results. ? ? ?Selvin Yun, RVT ?05/23/2021, 12:53 PM ? ?

## 2021-05-23 NOTE — Plan of Care (Signed)
?  Problem: Education: ?Goal: Knowledge of disease or condition will improve ?Outcome: Progressing ?Goal: Knowledge of secondary prevention will improve (SELECT ALL) ?Outcome: Progressing ?Goal: Knowledge of patient specific risk factors will improve (INDIVIDUALIZE FOR PATIENT) ?Outcome: Progressing ?Goal: Individualized Educational Video(s) ?Outcome: Progressing ?  ?Problem: Coping: ?Goal: Will verbalize positive feelings about self ?Outcome: Progressing ?Goal: Will identify appropriate support needs ?Outcome: Progressing ?  ?Problem: Health Behavior/Discharge Planning: ?Goal: Ability to manage health-related needs will improve ?Outcome: Progressing ?  ?Problem: Self-Care: ?Goal: Verbalization of feelings and concerns over difficulty with self-care will improve ?Outcome: Progressing ?Goal: Ability to communicate needs accurately will improve ?Outcome: Progressing ?  ?Problem: Nutrition: ?Goal: Risk of aspiration will decrease ?Outcome: Progressing ?  ?Problem: Ischemic Stroke/TIA Tissue Perfusion: ?Goal: Complications of ischemic stroke/TIA will be minimized ?Outcome: Progressing ?  ?Problem: Health Behavior/Discharge Planning: ?Goal: Ability to manage health-related needs will improve ?Outcome: Progressing ?  ?Problem: Safety: ?Goal: Ability to remain free from injury will improve ?Outcome: Progressing ?  ?Problem: Skin Integrity: ?Goal: Risk for impaired skin integrity will decrease ?Outcome: Progressing ?  ?

## 2021-05-23 NOTE — Progress Notes (Addendum)
STROKE TEAM PROGRESS NOTE  ? ?INTERVAL HISTORY ?Patient is seen in his room with one family member at the bedside.  He reports that yesterday he did not feel well and had a fall.  EMS was called and he was found to have slurred speech and lower extremity weakness.  MRI reveals a small pontine stroke.  Interrogation of his loop recorder reveals no atrial fibrillation.  His family states that he has had multiple transient episodes of weakness which typically resolve before he can seek medical attention. ? ?Vitals:  ? 05/23/21 0349 05/23/21 0410 05/23/21 0809 05/23/21 1136  ?BP: (!) 149/72 (!) 171/70 (!) 141/69 (!) 145/71  ?Pulse: 63 62 70 82  ?Resp: '16 16 16 18  '$ ?Temp: 97.9 ?F (36.6 ?C) 98 ?F (36.7 ?C) (!) 97.5 ?F (36.4 ?C) (!) 97.5 ?F (36.4 ?C)  ?TempSrc: Oral  Oral Oral  ?SpO2: 100% 100% 97% 100%  ?Weight:      ?Height:      ? ?CBC:  ?Recent Labs  ?Lab 05/22/21 ?1339 05/22/21 ?1346 05/23/21 ?0232  ?WBC 7.0  --  5.9  ?NEUTROABS 5.0  --   --   ?HGB 13.7 13.6 13.2  ?HCT 39.7 40.0 38.5*  ?MCV 92.8  --  91.9  ?PLT 208  --  202  ? ?Basic Metabolic Panel:  ?Recent Labs  ?Lab 05/22/21 ?1339 05/22/21 ?1346  ?NA 139 139  ?K 4.2 4.0  ?CL 108 105  ?CO2 23  --   ?GLUCOSE 151* 155*  ?BUN 19 22  ?CREATININE 0.97 0.80  ?CALCIUM 9.5  --   ? ?Lipid Panel:  ?Recent Labs  ?Lab 05/22/21 ?1624  ?CHOL 163  ?TRIG 49  ?HDL 47  ?CHOLHDL 3.5  ?VLDL 10  ?LDLCALC 106*  ? ?HgbA1c:  ?Recent Labs  ?Lab 05/22/21 ?1620  ?HGBA1C 7.7*  ? ?Urine Drug Screen: No results for input(s): LABOPIA, COCAINSCRNUR, LABBENZ, AMPHETMU, THCU, LABBARB in the last 168 hours.  ?Alcohol Level No results for input(s): ETH in the last 168 hours. ? ?IMAGING past 24 hours ?MR ANGIO HEAD WO CONTRAST ? ?Result Date: 05/22/2021 ?CLINICAL DATA:  Transient ischemic attack (TIA) EXAM: MRI HEAD WITHOUT CONTRAST MRA HEAD WITHOUT CONTRAST TECHNIQUE: Multiplanar, multi-echo pulse sequences of the brain and surrounding structures were acquired without intravenous contrast.  Angiographic images of the Circle of Willis were acquired using MRA technique without intravenous contrast. COMPARISON:  No pertinent prior exam. FINDINGS: MRI HEAD Brain: Approximately 1.8 cm area of reduced diffusion in the parasagittal left pons. Prominence of the ventricles and sulci reflects stable to slightly increased parenchymal volume loss. Chronic right frontal cortical/subcortical infarct. Chronic bilateral deep gray nuclei infarcts. Additional patchy and confluent areas of T2 hyperintensity in the supratentorial white matter are nonspecific but probably reflect moderate chronic microvascular ischemic changes. No intracranial mass or mass effect. There is no hydrocephalus or extra-axial fluid collection. Vascular: Major vessel flow voids at the skull base are preserved. Skull and upper cervical spine: Normal marrow signal is preserved. Sinuses/Orbits: Paranasal sinuses are aerated. Bilateral lens replacements. Other: Sella is unremarkable.  Mastoid air cells are clear. MRA HEAD Intracranial internal carotid arteries are patent. Middle and anterior cerebral arteries are patent. Intracranial vertebral arteries, basilar artery, posterior cerebral arteries are patent. Marked stenosis is present at the proximal left P1 PCA. There is no aneurysm. IMPRESSION: Acute perforator infarct of the parasagittal left pons. Chronic infarcts and chronic microvascular ischemic changes. No proximal intracranial vessel occlusion. Marked stenosis of the proximal left P1 PCA.  Electronically Signed   By: Macy Mis M.D.   On: 05/22/2021 16:05  ? ?MR BRAIN WO CONTRAST ? ?Result Date: 05/22/2021 ?CLINICAL DATA:  Transient ischemic attack (TIA) EXAM: MRI HEAD WITHOUT CONTRAST MRA HEAD WITHOUT CONTRAST TECHNIQUE: Multiplanar, multi-echo pulse sequences of the brain and surrounding structures were acquired without intravenous contrast. Angiographic images of the Circle of Willis were acquired using MRA technique without  intravenous contrast. COMPARISON:  No pertinent prior exam. FINDINGS: MRI HEAD Brain: Approximately 1.8 cm area of reduced diffusion in the parasagittal left pons. Prominence of the ventricles and sulci reflects stable to slightly increased parenchymal volume loss. Chronic right frontal cortical/subcortical infarct. Chronic bilateral deep gray nuclei infarcts. Additional patchy and confluent areas of T2 hyperintensity in the supratentorial white matter are nonspecific but probably reflect moderate chronic microvascular ischemic changes. No intracranial mass or mass effect. There is no hydrocephalus or extra-axial fluid collection. Vascular: Major vessel flow voids at the skull base are preserved. Skull and upper cervical spine: Normal marrow signal is preserved. Sinuses/Orbits: Paranasal sinuses are aerated. Bilateral lens replacements. Other: Sella is unremarkable.  Mastoid air cells are clear. MRA HEAD Intracranial internal carotid arteries are patent. Middle and anterior cerebral arteries are patent. Intracranial vertebral arteries, basilar artery, posterior cerebral arteries are patent. Marked stenosis is present at the proximal left P1 PCA. There is no aneurysm. IMPRESSION: Acute perforator infarct of the parasagittal left pons. Chronic infarcts and chronic microvascular ischemic changes. No proximal intracranial vessel occlusion. Marked stenosis of the proximal left P1 PCA. Electronically Signed   By: Macy Mis M.D.   On: 05/22/2021 16:05  ? ?DG Chest Port 1 View ? ?Result Date: 05/22/2021 ?CLINICAL DATA:  Weakness.  Code stroke. EXAM: PORTABLE CHEST 1 VIEW COMPARISON:  04/17/2018 FINDINGS: Loop recorder is identified in the projection of the left heart. Bilateral calcified pleural plaques are again noted. No pleural effusion, interstitial edema or airspace consolidation. Osseous structures are unremarkable. IMPRESSION: No acute cardiopulmonary abnormalities. 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   On: 05/22/2021 13:59  ? ?ECHOCARDIOGRAM COMPLETE ? ?Result Date: 05/22/2021 ?   ECHOCARDIOGRAM REPORT   Patient Name:   Alan Rubio  Date of Exam: 05/22/2021 Medical Rec #:  [id number]  Height:       72.0 in Accession #:    [id number] Weight:       178.1 lb Date of Birth:  April 05, 1931  BSA:          2.028 m? Patient Age:    88 years   BP:           126/70 mmHg Patient Gender: M          HR:           60 bpm. Exam Location:  Inpatient Procedure: 2D Echo, Cardiac Doppler, Color Doppler and Intracardiac            Opacification Agent Indications:    Stroke  History:        Patient has no prior history of Echocardiogram examinations.                 Stroke; Risk Factors:Hypertension and Diabetes.  Sonographer:    Roseanna Rainbow RDCS Referring Phys: [id number] [facility]  Sonographer Comments: Technically difficult study due to poor echo windows. IMPRESSIONS  1. Left ventricular ejection fraction, by estimation, is 60 to 65%. The left ventricle has normal function. The left ventricle has  no regional wall motion abnormalities. Left ventricular diastolic parameters are consistent with Grade I diastolic dysfunction (impaired relaxation).  2. Right ventricular systolic function is normal. The right ventricular size is normal.  3. The mitral valve is normal in structure. Trivial mitral valve regurgitation. No evidence of mitral stenosis.  4. The aortic valve is tricuspid. There is mild calcification of the aortic valve. There is mild thickening of the aortic valve. Aortic valve regurgitation is mild. Aortic valve sclerosis/calcification is present, without any evidence of aortic stenosis.  5. The inferior vena cava is dilated in size with >50% respiratory variability, suggesting right atrial pressure of 8 mmHg. Conclusion(s)/Recommendation(s): No intracardiac source of embolism detected on this transthoracic study. Consider a transesophageal echocardiogram to  exclude cardiac source of embolism if clinically indicated. FINDINGS  Left Ventricle: Left ventricular ejection fraction, by estimation, is 60 to 65%. The left ventricle has normal function. The left ventricle has no regiona

## 2021-05-23 NOTE — Progress Notes (Signed)
Spoke to [employer] from [employer] to interrogate patient's loop recorder. An available representative will call back. ? ?

## 2021-05-24 DIAGNOSIS — E039 Hypothyroidism, unspecified: Secondary | ICD-10-CM | POA: Diagnosis not present

## 2021-05-24 DIAGNOSIS — I6302 Cerebral infarction due to thrombosis of basilar artery: Secondary | ICD-10-CM | POA: Diagnosis not present

## 2021-05-24 DIAGNOSIS — E7800 Pure hypercholesterolemia, unspecified: Secondary | ICD-10-CM | POA: Diagnosis not present

## 2021-05-24 DIAGNOSIS — I6340 Cerebral infarction due to embolism of unspecified cerebral artery: Secondary | ICD-10-CM | POA: Diagnosis not present

## 2021-05-24 DIAGNOSIS — Z4509 Encounter for adjustment and management of other cardiac device: Secondary | ICD-10-CM | POA: Diagnosis not present

## 2021-05-24 DIAGNOSIS — I10 Essential (primary) hypertension: Secondary | ICD-10-CM | POA: Diagnosis not present

## 2021-05-24 DIAGNOSIS — Z95818 Presence of other cardiac implants and grafts: Secondary | ICD-10-CM | POA: Diagnosis not present

## 2021-05-24 LAB — BASIC METABOLIC PANEL
Anion gap: 8 (ref 5–15)
BUN: 17 mg/dL (ref 8–23)
CO2: 19 mmol/L — ABNORMAL LOW (ref 22–32)
Calcium: 8.7 mg/dL — ABNORMAL LOW (ref 8.9–10.3)
Chloride: 110 mmol/L (ref 98–111)
Creatinine, Ser: 0.71 mg/dL (ref 0.61–1.24)
GFR, Estimated: >60 mL/min (ref >60)
Glucose, Bld: 136 mg/dL — ABNORMAL HIGH (ref 70–99)
Potassium: 3.7 mmol/L (ref 3.5–5.1)
Sodium: 137 mmol/L (ref 135–145)

## 2021-05-24 LAB — GLUCOSE, CAPILLARY
Glucose-Capillary: 116 mg/dL — ABNORMAL HIGH (ref 70–99)
Glucose-Capillary: 96 mg/dL (ref 70–99)
Glucose-Capillary: 204 mg/dL — ABNORMAL HIGH (ref 70–99)
Glucose-Capillary: 150 mg/dL — ABNORMAL HIGH (ref 70–99)

## 2021-05-24 LAB — MAGNESIUM: Magnesium: 2 mg/dL (ref 1.7–2.4)

## 2021-05-24 MED ORDER — SODIUM BICARBONATE 650 MG PO TABS
650.0000 mg | ORAL_TABLET | Freq: Two times a day (BID) | ORAL | Status: DC
  Administered 2021-05-24 – 2021-05-25 (×3): 650 mg via ORAL
  Filled 2021-05-24 (×3): qty 1

## 2021-05-24 MED ORDER — LOSARTAN POTASSIUM 50 MG PO TABS
50.0000 mg | ORAL_TABLET | Freq: Every day | ORAL | Status: DC
Start: 2021-05-24 — End: 2021-05-25
  Administered 2021-05-24 – 2021-05-25 (×2): 50 mg via ORAL
  Filled 2021-05-24 (×2): qty 1

## 2021-05-24 NOTE — NC FL2 (Signed)
?[place] MEDICAID FL2 LEVEL OF CARE SCREENING TOOL  ?  ? ?IDENTIFICATION  ?Patient Name: ?Alan Rubio Birthdate: [birth year] Sex: male Admission Date (Current Location): ?05/22/2021  ?[place] and [place] Number: ?   ?  Facility and Address:  ?[facility] [place], [place] [address], [place], [place] [address] ?     Provider Number: ?   ?Attending Physician Name and Address:  ?Eugenie Filler, MD ? Relative Name and Phone Number:  ?  ?   ?Current Level of Care: ?  Recommended Level of Care: ?  Prior Approval Number: ?  ? ?Date Approved/Denied: ?  PASRR Number: ?[id number] A ? ?Discharge Plan: ?  ?  ? ?Current Diagnoses: ?Patient Active Problem List  ? Diagnosis Date Noted  ? CVA (cerebral vascular accident) ([place]) 05/22/2021  ? Encounter for loop recorder check 09/15/2018  ? 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 Hyperlipemia 06/13/2018  ? Hypothyroidism 06/13/2018  ? H/O: stroke 04/14/2018  ? Anemia 04/14/2018  ? HTN (hypertension) 04/14/2018  ? Gout 04/14/2018  ? Type 2 diabetes mellitus ([place]) 04/14/2018  ? ? ?Orientation RESPIRATION BLADDER Height & Weight   ?  ?  ? Normal External catheter Weight: 178 lb 2.1 oz (80.8 kg) ?Height:  6' (182.9 cm)  ?BEHAVIORAL SYMPTOMS/MOOD NEUROLOGICAL BOWEL NUTRITION STATUS  ?    Continent    ?AMBULATORY STATUS COMMUNICATION OF NEEDS Skin   ?  Verbally Normal ?  ?  ?  ?    ?     ?     ? ? ?Personal Care Assistance Level of Assistance  ?    ?  ?  ?   ? ?Functional Limitations Info  ?Hearing, Sight, Speech Sight Info: Impaired ?Hearing Info: Impaired ?Speech Info: Adequate  ? ? ?SPECIAL CARE FACTORS FREQUENCY  ?PT (By licensed PT), OT (By licensed OT), Speech therapy   ?  ?  ?  ?  ?  ?  ?   ? ? ?Contractures Contractures Info: Not present  ? ? ?Additional Factors Info  ?Code Status Code Status Info: FULL CODE ?  ?  ?  ?  ?   ? ?Current Medications (05/24/2021):  This is the current hospital active medication list ?Current  Facility-Administered Medications  ?Medication Dose Route Frequency Provider Last Rate Last Admin  ? acetaminophen (TYLENOL) tablet 650 mg  650 mg Oral Q4H PRN Norval Morton, MD      ? Or  ? acetaminophen (TYLENOL) 160 MG/5ML solution 650 mg  650 mg Per Tube Q4H PRN Fuller Plan A, MD      ? Or  ? acetaminophen (TYLENOL) suppository 650 mg  650 mg Rectal Q4H PRN Fuller Plan A, MD      ? allopurinol (ZYLOPRIM) tablet 300 mg  300 mg Oral Daily Smith, Rondell A, MD   300 mg at 05/24/21 1020  ? aspirin EC tablet 81 mg  81 mg Oral Daily Rosalin Hawking, MD   81 mg at 05/24/21 1021  ? atorvastatin (LIPITOR) tablet 40 mg  40 mg Oral Daily Eugenie Filler, MD   40 mg at 05/24/21 1021  ? clopidogrel (PLAVIX) tablet 75 mg  75 mg Oral Daily Rosalin Hawking, MD   75 mg at 05/24/21 1021  ? doxazosin (CARDURA) tablet 4 mg  4 mg Oral Daily Tamala Julian, Rondell A, MD   4 mg at 05/24/21 1021  ? empagliflozin (JARDIANCE) tablet 25 mg  25 mg Oral Daily Tamala Julian,  Rondell A, MD   25 mg at 05/24/21 1021  ? enoxaparin (LOVENOX) injection 40 mg  40 mg Subcutaneous Q24H Smith, Rondell A, MD   40 mg at 05/23/21 2125  ? ezetimibe (ZETIA) tablet 10 mg  10 mg Oral Daily Rosalin Hawking, MD   10 mg at 05/24/21 1021  ? hydrALAZINE (APRESOLINE) injection 10 mg  10 mg Intravenous Q4H PRN Fuller Plan A, MD      ? insulin aspart (novoLOG) injection 0-9 Units  0-9 Units Subcutaneous TID WC Eugenie Filler, MD      ? levothyroxine (SYNTHROID) tablet 88 mcg  88 mcg Oral QAC breakfast Norval Morton, MD   88 mcg at 05/24/21 9450  ? losartan (COZAAR) tablet 50 mg  50 mg Oral Daily Eugenie Filler, MD   50 mg at 05/24/21 1020  ? melatonin tablet 1.5 mg  1.5 mg Oral QHS Smith, Rondell A, MD   1.5 mg at 05/23/21 2125  ? sodium bicarbonate tablet 650 mg  650 mg Oral BID Eugenie Filler, MD   650 mg at 05/24/21 1020  ? ? ? ?Discharge Medications: ?Please see discharge summary for a list of discharge medications. ? ?Relevant Imaging Results: ? ?Relevant Lab  Results: ? ? ?Additional Information ?SS# [id number] ? ?Amador Cunas, [place] ? ? ? ? ?

## 2021-05-24 NOTE — PMR Pre-admission (Signed)
PMR Admission Coordinator Pre-Admission Assessment ? ?Patient: Alan Rubio is an 88 y.o., male ?MRN: [id number] ?DOB: 1931/12/23 ?Height: 6' (182.9 cm) ?Weight: 80.8 kg ? ?Insurance Information ?HMO:     PPO:      PCP:      IPA:      80/20: yes      OTHER:  ?PRIMARY:  Medicare A and B      Policy#: [id number] Subscriber: pt ?CM Name:       Phone#:      Fax#:  ?Pre-Cert#:       Employer:  ?Benefits:  Phone #: verified eligibility via [place] on 05/24/21     Name:  ?Eff. Date: Part A & B effective 04/26/1998 effective    Deduct: $1,600      Out of Pocket Max: NA      Life Max: NA ?CIR: 100% coverage      SNF: 100% for days 1-20, 80% days 21-100 ?Outpatient: 80% coverage     Co-Pay: 20% ?Home Health: 100% coverage      Co-Pay:  ?DME: 80% coverage     Co-Pay: 20% ?Providers: pt's choice ? ?SECONDARY: AARP      Policy#: [id number]     Phone#:  ? ?Financial Counselor:       Phone#:  ? ?The ?Data Collection Information Summary? for patients in Inpatient Rehabilitation Facilities with attached ?Privacy Act [place] Records? was provided and verbally reviewed with: Patient and Family ? ?Emergency Contact Information ?Contact Information   ? ? Name Relation Home Work Mobile  ? Hanner,James Son   [phone]  ? Kratky,BONNIE Spouse [phone]    ? ?  ? ? ?Current Medical History  ?Patient Admitting Diagnosis: CVA ?History of Present Illness: Gabreil Corbit is a 88 y.o. male with medical history significant of hypertension, hyperlipidemia, atrial fibrillation, CVA in 2022, hypothyroidism, and DM type II who presented to the ED  05/22/21 due to weakness and confusion.  Patient's stepson is present at bedside and helps provide additional history. Upon admission into the emergency department patient was noted to be afebrile with blood pressures elevated up to 183/77, and all other vital signs stable.  Labs were unremarkable.  Chest x-ray noted no acute abnormality.  MRI of the brain significant for acute perforator infarct  of the parasagittal left pons with chronic infarcts and chronic microvascular ischemic changes appreciate. Pt. Admitted for further work up. Pt. Seen by PT/OT who recommended CIR to assist return to PLOF.  ? ? ?Complete NIHSS TOTAL: 0 ? ?Patient's medical record from [facility]  has been reviewed by the rehabilitation admission coordinator and physician. ? ?Past Medical History  ?Past Medical History:  ?Diagnosis Date  ? A-fib ([place])   ? Cancer [facility])   ? Diabetes mellitus without complication ([place])   ? Encounter for loop recorder check 09/15/2018  ? H/O: stroke 04/14/2018  ? Hyperlipemia 06/13/2018  ? Hypertension   ? Hypothyroidism 06/13/2018  ? 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 Remote loop recorder check  9.15.20: 1 false AF=SB/1st deg AVB/PACs  ? Stroke [facility])   ? Thyroid disease   ? ? ?Has the patient had major surgery during 100 days prior to admission? Yes ? ?Family History   ?family history includes Aneurysm in his father; Cancer in his brother; Stroke in his mother. ? ?Current Medications ? ?Current Facility-Administered Medications:  ?  acetaminophen (TYLENOL) tablet 650 mg, 650 mg, Oral, Q4H PRN **OR** acetaminophen (TYLENOL) 160  MG/5ML solution 650 mg, 650 mg, Per Tube, Q4H PRN **OR** acetaminophen (TYLENOL) suppository 650 mg, 650 mg, Rectal, Q4H PRN, Tamala Julian, Rondell A, MD ?  allopurinol (ZYLOPRIM) tablet 300 mg, 300 mg, Oral, Daily, Smith, Rondell A, MD, 300 mg at 05/24/21 1020 ?  aspirin EC tablet 81 mg, 81 mg, Oral, Daily, Rosalin Hawking, MD, 81 mg at 05/24/21 1021 ?  atorvastatin (LIPITOR) tablet 40 mg, 40 mg, Oral, Daily, Eugenie Filler, MD, 40 mg at 05/24/21 1021 ?  [COMPLETED] clopidogrel (PLAVIX) tablet 300 mg, 300 mg, Oral, Once, 300 mg at 05/22/21 1904 **FOLLOWED BY** clopidogrel (PLAVIX) tablet 75 mg, 75 mg, Oral, Daily, Rosalin Hawking, MD, 75 mg at 05/24/21 1021 ?  doxazosin (CARDURA) tablet 4 mg, 4 mg, Oral, Daily, Tamala Julian, Rondell A, MD, 4 mg at  05/24/21 1021 ?  empagliflozin (JARDIANCE) tablet 25 mg, 25 mg, Oral, Daily, Smith, Rondell A, MD, 25 mg at 05/24/21 1021 ?  enoxaparin (LOVENOX) injection 40 mg, 40 mg, Subcutaneous, Q24H, Smith, Rondell A, MD, 40 mg at 05/23/21 2125 ?  ezetimibe (ZETIA) tablet 10 mg, 10 mg, Oral, Daily, Rosalin Hawking, MD, 10 mg at 05/24/21 1021 ?  hydrALAZINE (APRESOLINE) injection 10 mg, 10 mg, Intravenous, Q4H PRN, Tamala Julian, Rondell A, MD ?  insulin aspart (novoLOG) injection 0-9 Units, 0-9 Units, Subcutaneous, TID WC, Eugenie Filler, MD ?  levothyroxine (SYNTHROID) tablet 88 mcg, 88 mcg, Oral, QAC breakfast, Fuller Plan A, MD, 88 mcg at 05/24/21 2952 ?  losartan (COZAAR) tablet 50 mg, 50 mg, Oral, Daily, Eugenie Filler, MD, 50 mg at 05/24/21 1020 ?  melatonin tablet 1.5 mg, 1.5 mg, Oral, QHS, Smith, Rondell A, MD, 1.5 mg at 05/23/21 2125 ?  sodium bicarbonate tablet 650 mg, 650 mg, Oral, BID, Eugenie Filler, MD, 650 mg at 05/24/21 1020 ? ?Patients Current Diet:  ?Diet Order   ? ?       ?  Diet heart healthy/carb modified Room service appropriate? Yes; Fluid consistency: Thin  Diet effective now       ?  ? ?  ?  ? ?  ? ? ?Precautions / Restrictions ?Precautions ?Precautions: Fall  ? ?Has the patient had 2 or more falls or a fall with injury in the past year? No ? ?Prior Activity Level ?Limited Community (1-2x/wk): Mostly went out for appointments ? ?Prior Functional Level ?Self Care: Did the patient need help bathing, dressing, using the toilet or eating? Independent ? ?Indoor Mobility: Did the patient need assistance with walking from room to room (with or without device)? Independent ? ?Stairs: Did the patient need assistance with internal or external stairs (with or without device)? Independent ? ?Functional Cognition: Did the patient need help planning regular tasks such as shopping or remembering to take medications? Independent ? ?Patient Information ?Are you of Hispanic, Latino/a,or Spanish origin?: A. No, not  of Hispanic, Latino/a, or Spanish origin ?What is your race?: A. White ?Do you need or want an interpreter to communicate with a doctor or health care staff?: 0. No ? ?Patient's Response To:  ?Health Literacy and Transportation ?Is the patient able to respond to health literacy and transportation needs?: Yes ?Health Literacy - How often do you need to have someone help you when you read instructions, pamphlets, or other written material from your doctor or pharmacy?: Never ?In the past 12 months, has lack of transportation kept you from medical appointments or from getting medications?: No ?In the past 12 months, has lack of transportation  kept you from meetings, work, or from getting things needed for daily living?: No ? ?Home Assistive Devices / Equipment ?Home Equipment: [name] - single point ? ?Prior Device Use: Indicate devices/aids used by the patient prior to current illness, exacerbation or injury? None of the above ? ?Current Functional Level ?Cognition ? Overall Cognitive Status: Within Functional Limits for tasks assessed ?Orientation Level: Oriented to person, Oriented to place, Oriented to time, Disoriented to situation ?   ?Extremity Assessment ?(includes Sensation/Coordination) ? Upper Extremity Assessment: Defer to OT evaluation  ?Lower Extremity Assessment: Generalized weakness, RLE deficits/detail ?RLE Deficits / Details: 4/5 grossly graded ?RLE Sensation: decreased proprioception  ?  ?ADLs ?    ?  ?Mobility ? Overal bed mobility: Needs Assistance ?Bed Mobility: Supine to Sit ?Supine to sit: Mod assist, HOB elevated ?General bed mobility comments: +rail, increased time, use of bed pad to scoot to EOB  ?  ?Transfers ? Overall transfer level: Needs assistance ?Equipment used: Rolling walker (2 wheels) ?Transfers: Sit to/from Stand ?Sit to Stand: Mod assist ?General transfer comment: cues for hand placement, assist to power up  ?  ?Ambulation / Gait / Stairs / Wheelchair Mobility ?  Ambulation/Gait ?Ambulation/Gait assistance: Min assist ?Gait Distance (Feet): 50 Feet ?Assistive device: Rolling walker (2 wheels) ?Gait Pattern/deviations: Step-through pattern, Decreased stride length, Drifts right/left ?Ge

## 2021-05-24 NOTE — Progress Notes (Addendum)
?PROGRESS NOTE ? ? ? Alan Rubio  VZC:[id number] DOB: April 07, 1931 DOA: 05/22/2021 ?PCP: [name], MD  ? ? ?Chief Complaint  ?Patient presents with  ? Code Stroke  ? ? ?Brief Narrative: ?Patient 88 year old gentleman history of hypertension, hyperlipidemia, history of CVA 2022, hypothyroidism, type 2 diabetes presenting with confusion, lower extremity weakness, slurred speech.  Patient seen in the ED blood pressure is noted to be elevated at 183/77, chest x-ray no acute abnormality, MRI brain with acute perforator infarct of the parasagittal left pons with chronic infarcts and chronic microvascular ischemic changes.  Patient assessed by neurology recommended admission for stroke work-up.  Patient maintained on home regimen aspirin and Plavix added.  Stroke work-up underway.  Neurology following. ? ? ?Assessment & Plan: ?  ?Principal Problem: ?  CVA (cerebral vascular accident) ([place]) ?Active Problems: ?  HTN (hypertension) ?  Type 2 diabetes mellitus ([place]) ?  Hyperlipemia ?  Hypothyroidism ?  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 ? ?#1 acute CVA/left pontine infarct likely secondary to small vessel disease ?-Patient presented with lower extremity weakness, dysarthric speech. ?-Head CT done negative for any acute abnormalities. 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 ?-MRA with stenosis of left P1 PCA. ?-Carotid Dopplers done with no significant ICA stenosis. ?-2D echo done with EF of 60 to 12%, grade 1 diastolic dysfunction, no source of emboli noted. ?-LDL of 106.  Hemoglobin A1c 7.7. ?-Permissive hypertension ?-Patient noted to have been on aspirin 81 mg daily prior to admission. ?-Patient seen in consultation by neurology who recommended dual antiplatelet therapy with aspirin and Plavix x3 weeks then Plavix alone. ?-Patient noted to have had a loop recorder which was interrogated and revealed no atrial fibrillation. ?-Patient started on a statin.  Continue  Zetia.  ?-Patient seen by PT who are recommending SNF placement versus CIR. ?-OT/SLP pending. ?-Neurology following and appreciate input and recommendations. ? ?2.  Diabetes mellitus type 2 ?-Hemoglobin A1c 7.7 (05/22/2021). ?-CBG 106 this morning.   ?-Actos on hold.   ?-Continue Jardiance, SSI. ? ?3.  Hyperlipidemia ?-LDL of 106. ?-Noted to be on Zetia prior to admission which has been continued. ?-Patient started on statin. ? ?4.  Hypothyroidism ?-TSH 0.990.   ?-Continue home regimen Synthroid. ? ?5.  Hypertension ?-Continue Cardura. ?-Resume home regimen losartan. ? ?6.  Fall at home ?-Per admitting physician patient noted to have had a fall prior to admission at home. ?-PT/OT assessed patient and recommending CIR versus SNF ? ?DVT prophylaxis: Lovenox ?Code Status: Full ?Family Communication: Updated patient.  No family at bedside.   ?Disposition: CIR versus SNF ? ?Status is: Inpatient ?Remains inpatient appropriate because: Severity of illness ?  ?Consultants:  ?Neurology: Dr.Xu 05/22/2021 ? ?Procedures:  ?Chest x-ray 05/22/2021 ?MRI brain/MRA head 05/22/2021 ?2D echo 05/22/2021 ?Carotid Dopplers 05/23/2021 ? ? ? ?Antimicrobials:  ?None ? ? ?Subjective: ?Sleeping but arousable.  Denies chest pain.  No shortness of breath.  No abdominal pain.  Just waking up and unable to tell how he feels overall.  ? ?Objective: ?Vitals:  ? 05/23/21 1543 05/23/21 2029 05/24/21 0020 05/24/21 0420  ?BP: (!) 145/68 (!) 169/81 (!) 159/66 (!) 194/82  ?Pulse: 73 81 61 65  ?Resp: '16 20 18 19  '$ ?Temp: (!) 97.4 ?F (36.3 ?C) 98.8 ?F (37.1 ?C) 97.7 ?F (36.5 ?C) 98.3 ?F (36.8 ?C)  ?TempSrc: Oral Oral Oral Oral  ?SpO2: 100% 99% 100% 98%  ?Weight:      ?Height:      ? ? ?  Intake/Output Summary (Last 24 hours) at 05/24/2021 1102 ?Last data filed at 05/24/2021 1020 ?Gross per 24 hour  ?Intake 2214.4 ml  ?Output 1350 ml  ?Net 864.4 ml  ? ?Filed Weights  ? 05/22/21 1300  ?Weight: 80.8 kg  ? ? ?Examination: ? ?General exam: NAD.  Dysarthric  speech. ?Respiratory system: CTA B.  No wheezes, no crackles, no rhonchi.  ?Cardiovascular system: Regular rate rhythm no murmurs rubs or gallops.  No JVD.  No lower extremity edema.  ?Gastrointestinal system: Abdomen is soft, nontender, nondistended, positive bowel sounds.  No rebound.  No guarding.   ?Central nervous system: Alert and oriented.  Dysarthric speech.  Moving extremities spontaneously.  Sensation intact.  Gait not tested secondary to safety.   ?Extremities: Symmetric 5 x 5 power. ?Skin: No rashes, lesions or ulcers ?Psychiatry: Judgement and insight appear normal. Mood & affect appropriate.  ? ? ? ?Data Reviewed: I have personally reviewed following labs and imaging studies ? ?CBC: ?Recent Labs  ?Lab 05/22/21 ?1339 05/22/21 ?1346 05/23/21 ?0232  ?WBC 7.0  --  5.9  ?NEUTROABS 5.0  --   --   ?HGB 13.7 13.6 13.2  ?HCT 39.7 40.0 38.5*  ?MCV 92.8  --  91.9  ?PLT 208  --  202  ? ? ? ?Basic Metabolic Panel: ?Recent Labs  ?Lab 05/22/21 ?1339 05/22/21 ?1346 05/24/21 ?0133  ?NA 139 139 137  ?K 4.2 4.0 3.7  ?CL 108 105 110  ?CO2 23  --  19*  ?GLUCOSE 151* 155* 136*  ?BUN '19 22 17  '$ ?CREATININE 0.97 0.80 0.71  ?CALCIUM 9.5  --  8.7*  ?MG  --   --  2.0  ? ? ? ?GFR: ?Estimated Creatinine Clearance: 70.1 mL/min (by C-G formula based on SCr of 0.71 mg/dL). ? ?Liver Function Tests: ?Recent Labs  ?Lab 05/22/21 ?1339  ?AST 24  ?ALT 14  ?ALKPHOS 46  ?BILITOT 0.8  ?PROT 6.5  ?ALBUMIN 3.7  ? ? ? ?CBG: ?Recent Labs  ?Lab 05/22/21 ?1257 05/22/21 ?2205 05/23/21 ?6063 05/24/21 ?0759  ?GLUCAP 169* 123* 106* 116*  ? ? ? ? ?No results found for this or any previous visit (from the past 240 hour(s)).  ? ? ? ? ? ?Radiology Studies: ?MR ANGIO HEAD WO CONTRAST ? ?Result Date: 05/22/2021 ?CLINICAL DATA:  Transient ischemic attack (TIA) EXAM: MRI HEAD WITHOUT CONTRAST MRA HEAD WITHOUT CONTRAST TECHNIQUE: Multiplanar, multi-echo pulse sequences of the brain and surrounding structures were acquired without intravenous contrast.  Angiographic images of the Circle of Willis were acquired using MRA technique without intravenous contrast. COMPARISON:  No pertinent prior exam. FINDINGS: MRI HEAD Brain: Approximately 1.8 cm area of reduced diffusion in the parasagittal left pons. Prominence of the ventricles and sulci reflects stable to slightly increased parenchymal volume loss. Chronic right frontal cortical/subcortical infarct. Chronic bilateral deep gray nuclei infarcts. Additional patchy and confluent areas of T2 hyperintensity in the supratentorial white matter are nonspecific but probably reflect moderate chronic microvascular ischemic changes. No intracranial mass or mass effect. There is no hydrocephalus or extra-axial fluid collection. Vascular: Major vessel flow voids at the skull base are preserved. Skull and upper cervical spine: Normal marrow signal is preserved. Sinuses/Orbits: Paranasal sinuses are aerated. Bilateral lens replacements. Other: Sella is unremarkable.  Mastoid air cells are clear. MRA HEAD Intracranial internal carotid arteries are patent. Middle and anterior cerebral arteries are patent. Intracranial vertebral arteries, basilar artery, posterior cerebral arteries are patent. Marked stenosis is present at the proximal left P1 PCA. There  is no aneurysm. IMPRESSION: Acute perforator infarct of the parasagittal left pons. Chronic infarcts and chronic microvascular ischemic changes. No proximal intracranial vessel occlusion. Marked stenosis of the proximal left P1 PCA. Electronically Signed   By: Macy Mis M.D.   On: 05/22/2021 16:05  ? ?MR BRAIN WO CONTRAST ? ?Result Date: 05/22/2021 ?CLINICAL DATA:  Transient ischemic attack (TIA) EXAM: MRI HEAD WITHOUT CONTRAST MRA HEAD WITHOUT CONTRAST TECHNIQUE: Multiplanar, multi-echo pulse sequences of the brain and surrounding structures were acquired without intravenous contrast. Angiographic images of the Circle of Willis were acquired using MRA technique without  intravenous contrast. COMPARISON:  No pertinent prior exam. FINDINGS: MRI HEAD Brain: Approximately 1.8 cm area of reduced diffusion in the parasagittal left pons. Prominence of the ventricles and sulci reflects stable to slightly increased

## 2021-05-24 NOTE — TOC Initial Note (Signed)
Transition of Care (TOC) - Initial/Assessment Note  ? ? ?Patient Details  ?Name: Alan Rubio ?MRN: [id number] ?Date of Birth: Mar 28, 1931 ? ?Transition of Care (TOC) CM/SW Contact:    ?Amador Cunas, LCSW ?Phone Number: ?05/24/2021, 10:50 AM ? ?Clinical Narrative:  SW spoke with [name] [phone] re dc planning options. Pt's son aware of PT recommendation for SNF and is agreeable to this option if needed but prefers [facility] and is requesting CIR. CIR consult pending. SW spoke to [place] with CIR who indicates pt looks like a good candidate and they will complete work up.  ? ?Pt from home with his wife and pt's son reports that family is  available to ensure 24/7 assist post rehab. Will send for SNF offers as back up plan and await CIR determination. No auth needed for SNF or CIR.  ? ?Wandra Feinstein, MSW, LCSW ?[phone] (coverage) ? ?             ? ? ?Expected Discharge Plan: [place] ?Barriers to Discharge: Continued Medical Work up ? ? ?Patient Goals and CMS Choice ?  ?  ?Choice offered to / list presented to : Adult Children ? ?Expected Discharge Plan and Services ?Expected Discharge Plan: [place] ?  ?  ?Post Acute Care Choice: IP Rehab ?Living arrangements for the past 2 months: [place] ?                ?  ?  ?  ?  ?  ?  ?  ?  ?  ?  ? ?Prior Living Arrangements/Services ?Living arrangements for the past 2 months: [place] ?Lives with:: Spouse ?Patient language and need for interpreter reviewed:: No ?       ?Need for Family Participation in Patient Care: Yes (Comment) ?Care giver support system in place?: Yes (comment) ?  ?Criminal Activity/Legal Involvement Pertinent to Current Situation/Hospitalization: No - Comment as needed ? ?Activities of Daily Living ?  ?ADL Screening (condition at time of admission) ?Patient's cognitive ability adequate to safely complete daily activities?: Yes ?Is the patient deaf or have difficulty hearing?: Yes ?Does the patient have  difficulty seeing, even when wearing glasses/contacts?: No ?Does the patient have difficulty concentrating, remembering, or making decisions?: No ?Patient able to express need for assistance with ADLs?: Yes ?Does the patient have difficulty dressing or bathing?: No ?Independently performs ADLs?: Yes (appropriate for developmental age) ?Does the patient have difficulty walking or climbing stairs?: Yes ?Weakness of Legs: None ?Weakness of Arms/Hands: None ? ?Permission Sought/Granted ?Permission sought to share information with : Customer service manager ?Permission granted to share information with : Yes, Verbal Permission Granted ?   ?   ?   ?   ? ?Emotional Assessment ?  ?  ?  ?Orientation: : Oriented to Self, Oriented to Place, Oriented to  Time, Oriented to Situation ?Alcohol / Substance Use: Not Applicable ?Psych Involvement: No (comment) ? ?Admission diagnosis:  Transient alteration of awareness [R40.4] ?Weakness [R53.1] ?CVA (cerebral vascular accident) [facility]) [I63.9] ?Stroke-like symptoms [R29.90] ?Patient Active Problem List  ? Diagnosis Date Noted  ? CVA (cerebral vascular accident) ([place]) 05/22/2021  ? Encounter for loop recorder check 09/15/2018  ? 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 Hyperlipemia 06/13/2018  ? Hypothyroidism 06/13/2018  ? H/O: stroke 04/14/2018  ? Anemia 04/14/2018  ? HTN (hypertension) 04/14/2018  ? Gout 04/14/2018  ? Type 2 diabetes mellitus ([place]) 04/14/2018  ? ?PCP:  Sheryn Bison,  Herbie Baltimore, MD ?Pharmacy:   ?[id number] - [place], [place] - [address] ?[address] ?[place] [address] ?Phone: [phone] Fax: [phone] ? ? ? ? ?Social Determinants of Health (SDOH) Interventions ?  ? ?Readmission Risk Interventions ?   ? View : No data to display.  ?  ?  ?  ? ? ? ?

## 2021-05-24 NOTE — Plan of Care (Signed)
?  Problem: Education: ?Goal: Knowledge of disease or condition will improve ?Outcome: Progressing ?Goal: Knowledge of secondary prevention will improve (SELECT ALL) ?Outcome: Progressing ?Goal: Knowledge of patient specific risk factors will improve (INDIVIDUALIZE FOR PATIENT) ?Outcome: Progressing ?Goal: Individualized Educational Video(s) ?Outcome: Progressing ?  ?Problem: Coping: ?Goal: Will verbalize positive feelings about self ?Outcome: Progressing ?Goal: Will identify appropriate support needs ?Outcome: Progressing ?  ?Problem: Health Behavior/Discharge Planning: ?Goal: Ability to manage health-related needs will improve ?Outcome: Progressing ?  ?Problem: Self-Care: ?Goal: Verbalization of feelings and concerns over difficulty with self-care will improve ?Outcome: Progressing ?Goal: Ability to communicate needs accurately will improve ?Outcome: Progressing ?  ?Problem: Nutrition: ?Goal: Risk of aspiration will decrease ?Outcome: Progressing ?  ?Problem: Ischemic Stroke/TIA Tissue Perfusion: ?Goal: Complications of ischemic stroke/TIA will be minimized ?Outcome: Progressing ?  ?Problem: Health Behavior/Discharge Planning: ?Goal: Ability to manage health-related needs will improve ?Outcome: Progressing ?  ?Problem: Safety: ?Goal: Ability to remain free from injury will improve ?Outcome: Progressing ?  ?Problem: Skin Integrity: ?Goal: Risk for impaired skin integrity will decrease ?Outcome: Progressing ?  ?

## 2021-05-24 NOTE — Evaluation (Signed)
Occupational Therapy Evaluation ?Patient Details ?Name: Alan Rubio ?MRN: [id number] ?DOB: [birth year] ?Today's Date: 05/24/2021 ? ? ?History of Present Illness Pt is an 88 y.o. male admitted 4/28 following a fall at home with c/o BLE weakness and slurred speech. 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  PMH:  hypertension, hyperlipidemia, atrial fibrillation, CVA in 2022, hypothyroidism, and DM type II  ? ?Clinical Impression ?  ?Pt admitted for concerns listed above. PTA pt reported that he was independent with all ADL's and IADL's, using a cane sometimes for mobility. At this time, pt demonstrates increased weakness, mild ataxic movements, balance deficits, and decreased activity tolerance. Overall vision and cognition appear near pt's baseline. He is requiring mod A for bed mobility and transfers, once standing and steady, he is able to ambulate for short distances in room with min guard to min A. Pt is very motivated to work with therapy and reports he just wants to get home with his wife. Recommending [facility] to maximize his independence and safety. OT will follow acutely.   ?   ? ?Recommendations for follow up therapy are one component of a multi-disciplinary discharge planning process, led by the attending physician.  Recommendations may be updated based on patient status, additional functional criteria and insurance authorization.  ? ?Follow Up Recommendations ? Acute inpatient rehab (3hours/day)  ?  ?Assistance Recommended at Discharge Frequent or constant Supervision/Assistance  ?Patient can return home with the following A lot of help with walking and/or transfers;A lot of help with bathing/dressing/bathroom;Assistance with cooking/housework;Direct supervision/assist for medications management;Direct supervision/assist for financial management;Assist for transportation;Help with stairs or ramp for entrance ? ?  ?Functional Status Assessment ? Patient has had a recent decline in their  functional status and demonstrates the ability to make significant improvements in function in a reasonable and predictable amount of time.  ?Equipment Recommendations ? BSC/3in1  ?  ?Recommendations for Other Services Rehab consult ? ? ?  ?Precautions / Restrictions Precautions ?Precautions: Fall ?Restrictions ?Weight Bearing Restrictions: No  ? ?  ? ?Mobility Bed Mobility ?Overal bed mobility: Needs Assistance ?Bed Mobility: Supine to Sit, Sit to Supine ?  ?  ?Supine to sit: Mod assist, HOB elevated ?Sit to supine: Mod assist ?  ?General bed mobility comments: +rail, increased time, use of bed pad to scoot to EOB, heavy mod to bring trunk up to sitting, mod o bring BLE back into bed ?  ? ?Transfers ?Overall transfer level: Needs assistance ?Equipment used: Rolling walker (2 wheels) ?Transfers: Sit to/from Stand ?Sit to Stand: Mod assist ?  ?  ?  ?  ?  ?General transfer comment: cues for hand placement, assist to power up ?  ? ?  ?Balance Overall balance assessment: Needs assistance ?Sitting-balance support: Feet supported, Bilateral upper extremity supported ?Sitting balance-Leahy Scale: Fair ?Sitting balance - Comments: at times pt suddenly jerks backwards, losing balance ?Postural control: Posterior lean ?Standing balance support: Bilateral upper extremity supported, During functional activity, Reliant on assistive device for balance ?Standing balance-Leahy Scale: Poor ?Standing balance comment: posterior lean in standing initially, able to correct with cuing and increased time ?  ?  ?  ?  ?  ?  ?  ?  ?  ?  ?  ?   ? ?ADL either performed or assessed with clinical judgement  ? ?ADL Overall ADL's : Needs assistance/impaired ?Eating/Feeding: Set up;Sitting ?  ?Grooming: Set up;Sitting ?  ?Upper Body Bathing: Minimal assistance;Sitting ?  ?Lower Body Bathing: Maximal assistance;Sitting/lateral leans;Sit  to/from stand ?  ?Upper Body Dressing : Minimal assistance;Sitting ?  ?Lower Body Dressing: Maximal  assistance;Sitting/lateral leans;Sit to/from stand ?  ?Toilet Transfer: Moderate assistance;Stand-pivot ?  ?Toileting- Clothing Manipulation and Hygiene: Maximal assistance;Sitting/lateral lean;Sit to/from stand ?  ?  ?  ?Functional mobility during ADLs: Moderate assistance;Rolling walker (2 wheels) ?General ADL Comments: Pt with increased weakness and mild ataxia noted, requiring max A for LB ADL's and min A for UB ADL's.  ? ? ? ?Vision Baseline Vision/History: 1 Wears glasses ?Ability to See in Adequate Light: 0 Adequate ?Patient Visual Report: No change from baseline ?Vision Assessment?: No apparent visual deficits ?Additional Comments: some dizziness noted when pt initially sat EOB  ?   ?Perception   ?  ?Praxis Praxis ?Praxis-Other Comments: Some difficulty reaching out for objects ?  ? ?Pertinent Vitals/Pain Pain Assessment ?Pain Assessment: No/denies pain  ? ? ? ?Hand Dominance Right ?  ?Extremity/Trunk Assessment Upper Extremity Assessment ?Upper Extremity Assessment: Generalized weakness (mild ataxia noted when reaching for objects, however able to complete finger to nose) ?  ?Lower Extremity Assessment ?Lower Extremity Assessment: Defer to PT evaluation ?  ?Cervical / Trunk Assessment ?Cervical / Trunk Assessment: Kyphotic ?  ?Communication Communication ?Communication: HOH ?  ?Cognition Arousal/Alertness: Awake/alert ?Behavior During Therapy: WFL for tasks assessed/performed (labile when family came in room) ?Overall Cognitive Status: Within Functional Limits for tasks assessed ?  ?  ?  ?  ?  ?  ?  ?  ?  ?  ?  ?  ?  ?  ?  ?  ?General Comments: Follows all commands and is aware of safety/deficits ?  ?  ?General Comments  VSS on RA - highest HR noted 110 in ssstanding ? ?  ?Exercises   ?  ?Shoulder Instructions    ? ? ?Home Living Family/patient expects to be discharged to:: Private residence ?Living Arrangements: Spouse/significant other ?Available Help at Discharge: Family;Available 24 hours/day ?Type of  Home: House ?Home Access: Stairs to enter ?Entrance Stairs-Number of Steps: 2 ?Entrance Stairs-Rails: Right ?Home Layout: Two level;Bed/bath upstairs ?Alternate Level Stairs-Number of Steps: Pt has a chair lift on flight of stairs. ?  ?Bathroom Shower/Tub: Walk-in shower ?  ?Bathroom Toilet: Handicapped height ?Bathroom Accessibility: Yes ?How Accessible: Accessible via wheelchair;Accessible via walker ?Home Equipment: [name] - single point;Shower seat ?  ?  ?  ? ?  ?Prior Functioning/Environment Prior Level of Function : Independent/Modified Independent ?  ?  ?  ?  ?  ?  ?Mobility Comments: amb household distances, occasional use of cane, primarily furniture walks ?ADLs Comments: Indep ?  ? ?  ?  ?OT Problem List: Decreased strength;Decreased activity tolerance;Impaired balance (sitting and/or standing);Decreased coordination;Decreased knowledge of use of DME or AE;Impaired UE functional use ?  ?   ?OT Treatment/Interventions: Self-care/ADL training;Therapeutic exercise;Neuromuscular education;Energy conservation;DME and/or AE instruction;Therapeutic activities;Patient/family education;Balance training  ?  ?OT Goals(Current goals can be found in the care plan section) Acute Rehab OT Goals ?Patient Stated Goal: To get stronger ?OT Goal Formulation: With patient/family ?Time For Goal Achievement: 06/07/21 ?Potential to Achieve Goals: Good ?ADL Goals ?Pt Will Perform Lower Body Bathing: with min assist;sitting/lateral leans;sit to/from stand ?Pt Will Perform Lower Body Dressing: with min assist;sitting/lateral leans;sit to/from stand ?Pt Will Transfer to Toilet: with min assist;ambulating ?Pt Will Perform Toileting - Clothing Manipulation and hygiene: with min assist;sitting/lateral leans;sit to/from stand ?Additional ADL Goal #1: Pt will complete a multistep ADL task with min physical assist and no cuing.  ?OT Frequency: Min  2X/week ?  ? ?Co-evaluation   ?  ?  ?  ?  ? ?  ?AM-PAC OT "6 Clicks" Daily Activity      ?Outcome Measure Help from another person eating meals?: A Little ?Help from another person taking care of personal grooming?: A Little ?Help from another person toileting, which includes using toliet, bedpan, or urinal?: A

## 2021-05-25 ENCOUNTER — Other Ambulatory Visit: Payer: Self-pay

## 2021-05-25 ENCOUNTER — Inpatient Hospital Stay (HOSPITAL_COMMUNITY)
Admission: RE | Admit: 2021-05-25 | Discharge: 2021-06-09 | DRG: 056 | Disposition: A | Discharge status: Skilled Nursing Facility | Payer: Medicare Other | Source: Intra-hospital | Attending: Physical Medicine & Rehabilitation | Admitting: Physical Medicine & Rehabilitation

## 2021-05-25 DIAGNOSIS — K5900 Constipation, unspecified: Secondary | ICD-10-CM | POA: Diagnosis not present

## 2021-05-25 DIAGNOSIS — I69341 Monoplegia of lower limb following cerebral infarction affecting right dominant side: Secondary | ICD-10-CM | POA: Diagnosis not present

## 2021-05-25 DIAGNOSIS — R109 Unspecified abdominal pain: Secondary | ICD-10-CM | POA: Diagnosis not present

## 2021-05-25 DIAGNOSIS — Z823 Family history of stroke: Secondary | ICD-10-CM | POA: Diagnosis not present

## 2021-05-25 DIAGNOSIS — H9190 Unspecified hearing loss, unspecified ear: Secondary | ICD-10-CM | POA: Diagnosis present

## 2021-05-25 DIAGNOSIS — D72829 Elevated white blood cell count, unspecified: Secondary | ICD-10-CM

## 2021-05-25 DIAGNOSIS — E785 Hyperlipidemia, unspecified: Secondary | ICD-10-CM | POA: Diagnosis present

## 2021-05-25 DIAGNOSIS — I69344 Monoplegia of lower limb following cerebral infarction affecting left non-dominant side: Secondary | ICD-10-CM | POA: Diagnosis not present

## 2021-05-25 DIAGNOSIS — Z20822 Contact with and (suspected) exposure to covid-19: Secondary | ICD-10-CM | POA: Diagnosis present

## 2021-05-25 DIAGNOSIS — I10 Essential (primary) hypertension: Secondary | ICD-10-CM

## 2021-05-25 DIAGNOSIS — R079 Chest pain, unspecified: Secondary | ICD-10-CM | POA: Diagnosis not present

## 2021-05-25 DIAGNOSIS — I69322 Dysarthria following cerebral infarction: Secondary | ICD-10-CM

## 2021-05-25 DIAGNOSIS — Z7984 Long term (current) use of oral hypoglycemic drugs: Secondary | ICD-10-CM | POA: Diagnosis not present

## 2021-05-25 DIAGNOSIS — R1312 Dysphagia, oropharyngeal phase: Secondary | ICD-10-CM | POA: Diagnosis not present

## 2021-05-25 DIAGNOSIS — R944 Abnormal results of kidney function studies: Secondary | ICD-10-CM | POA: Diagnosis not present

## 2021-05-25 DIAGNOSIS — I639 Cerebral infarction, unspecified: Secondary | ICD-10-CM | POA: Diagnosis present

## 2021-05-25 DIAGNOSIS — I4891 Unspecified atrial fibrillation: Secondary | ICD-10-CM | POA: Diagnosis present

## 2021-05-25 DIAGNOSIS — Z880 Allergy status to penicillin: Secondary | ICD-10-CM | POA: Diagnosis not present

## 2021-05-25 DIAGNOSIS — I6302 Cerebral infarction due to thrombosis of basilar artery: Secondary | ICD-10-CM | POA: Diagnosis not present

## 2021-05-25 DIAGNOSIS — Z79890 Hormone replacement therapy (postmenopausal): Secondary | ICD-10-CM | POA: Diagnosis not present

## 2021-05-25 DIAGNOSIS — I951 Orthostatic hypotension: Secondary | ICD-10-CM

## 2021-05-25 DIAGNOSIS — Z79899 Other long term (current) drug therapy: Secondary | ICD-10-CM | POA: Diagnosis not present

## 2021-05-25 DIAGNOSIS — Z7401 Bed confinement status: Secondary | ICD-10-CM | POA: Diagnosis not present

## 2021-05-25 DIAGNOSIS — E039 Hypothyroidism, unspecified: Secondary | ICD-10-CM | POA: Diagnosis present

## 2021-05-25 DIAGNOSIS — R4182 Altered mental status, unspecified: Secondary | ICD-10-CM | POA: Diagnosis not present

## 2021-05-25 DIAGNOSIS — Z95818 Presence of other cardiac implants and grafts: Secondary | ICD-10-CM

## 2021-05-25 DIAGNOSIS — Z974 Presence of external hearing-aid: Secondary | ICD-10-CM

## 2021-05-25 DIAGNOSIS — G9341 Metabolic encephalopathy: Secondary | ICD-10-CM | POA: Diagnosis not present

## 2021-05-25 DIAGNOSIS — R41841 Cognitive communication deficit: Secondary | ICD-10-CM | POA: Diagnosis not present

## 2021-05-25 DIAGNOSIS — E119 Type 2 diabetes mellitus without complications: Secondary | ICD-10-CM | POA: Diagnosis present

## 2021-05-25 DIAGNOSIS — R531 Weakness: Secondary | ICD-10-CM | POA: Diagnosis not present

## 2021-05-25 DIAGNOSIS — M6281 Muscle weakness (generalized): Secondary | ICD-10-CM | POA: Diagnosis not present

## 2021-05-25 DIAGNOSIS — Z7982 Long term (current) use of aspirin: Secondary | ICD-10-CM | POA: Diagnosis not present

## 2021-05-25 DIAGNOSIS — M109 Gout, unspecified: Secondary | ICD-10-CM | POA: Diagnosis present

## 2021-05-25 DIAGNOSIS — M542 Cervicalgia: Secondary | ICD-10-CM

## 2021-05-25 DIAGNOSIS — G9340 Encephalopathy, unspecified: Secondary | ICD-10-CM | POA: Diagnosis not present

## 2021-05-25 DIAGNOSIS — E7800 Pure hypercholesterolemia, unspecified: Secondary | ICD-10-CM | POA: Diagnosis not present

## 2021-05-25 DIAGNOSIS — E1159 Type 2 diabetes mellitus with other circulatory complications: Secondary | ICD-10-CM | POA: Diagnosis not present

## 2021-05-25 DIAGNOSIS — R279 Unspecified lack of coordination: Secondary | ICD-10-CM | POA: Diagnosis not present

## 2021-05-25 DIAGNOSIS — R1084 Generalized abdominal pain: Secondary | ICD-10-CM | POA: Diagnosis not present

## 2021-05-25 LAB — BASIC METABOLIC PANEL
Anion gap: 9 (ref 5–15)
BUN: 16 mg/dL (ref 8–23)
CO2: 21 mmol/L — ABNORMAL LOW (ref 22–32)
Calcium: 9.1 mg/dL (ref 8.9–10.3)
Chloride: 104 mmol/L (ref 98–111)
Creatinine, Ser: 0.77 mg/dL (ref 0.61–1.24)
GFR, Estimated: >60 mL/min (ref >60)
Glucose, Bld: 156 mg/dL — ABNORMAL HIGH (ref 70–99)
Potassium: 3.7 mmol/L (ref 3.5–5.1)
Sodium: 134 mmol/L — ABNORMAL LOW (ref 135–145)

## 2021-05-25 LAB — GLUCOSE, CAPILLARY
Glucose-Capillary: 123 mg/dL — ABNORMAL HIGH (ref 70–99)
Glucose-Capillary: 182 mg/dL — ABNORMAL HIGH (ref 70–99)
Glucose-Capillary: 184 mg/dL — ABNORMAL HIGH (ref 70–99)
Glucose-Capillary: 148 mg/dL — ABNORMAL HIGH (ref 70–99)

## 2021-05-25 LAB — CBC
HCT: 40.5 % (ref 39.0–52.0)
Hemoglobin: 13.8 g/dL (ref 13.0–17.0)
MCH: 31.4 pg (ref 26.0–34.0)
MCHC: 34.1 g/dL (ref 30.0–36.0)
MCV: 92 fL (ref 80.0–100.0)
Platelets: 209 10*3/uL (ref 150–400)
RBC: 4.4 MIL/uL (ref 4.22–5.81)
RDW: 13 % (ref 11.5–15.5)
WBC: 6.1 10*3/uL (ref 4.0–10.5)
nRBC: 0 % (ref 0.0–0.2)

## 2021-05-25 LAB — CREATININE, SERUM
Creatinine, Ser: 0.84 mg/dL (ref 0.61–1.24)
GFR, Estimated: >60 mL/min (ref >60)

## 2021-05-25 MED ORDER — MELATONIN 3 MG PO TABS
1.5000 mg | ORAL_TABLET | Freq: Every day | ORAL | Status: DC
  Administered 2021-05-25 – 2021-06-08 (×15): 1.5 mg via ORAL
  Filled 2021-05-25 (×17): qty 1

## 2021-05-25 MED ORDER — ALLOPURINOL 300 MG PO TABS
300.0000 mg | ORAL_TABLET | Freq: Every day | ORAL | Status: DC
  Administered 2021-05-26 – 2021-06-09 (×15): 300 mg via ORAL
  Filled 2021-05-25 (×15): qty 1

## 2021-05-25 MED ORDER — ENOXAPARIN SODIUM 40 MG/0.4ML IJ SOSY: 40.0000 mg | PREFILLED_SYRINGE | INTRAMUSCULAR | Status: DC

## 2021-05-25 MED ORDER — INSULIN ASPART 100 UNIT/ML IJ SOLN
0.0000 [IU] | Freq: Three times a day (TID) | INTRAMUSCULAR | Status: DC
  Administered 2021-05-25 – 2021-05-26 (×2): 2 [IU] via SUBCUTANEOUS
  Administered 2021-05-26: 1 [IU] via SUBCUTANEOUS
  Administered 2021-05-27: 2 [IU] via SUBCUTANEOUS
  Administered 2021-05-27: 1 [IU] via SUBCUTANEOUS
  Administered 2021-05-27 – 2021-05-28 (×2): 2 [IU] via SUBCUTANEOUS
  Administered 2021-05-28 (×2): 3 [IU] via SUBCUTANEOUS
  Administered 2021-05-29: 5 [IU] via SUBCUTANEOUS
  Administered 2021-05-29 (×2): 2 [IU] via SUBCUTANEOUS
  Administered 2021-05-30: 1 [IU] via SUBCUTANEOUS
  Administered 2021-05-30: 3 [IU] via SUBCUTANEOUS
  Administered 2021-05-30 – 2021-05-31 (×2): 1 [IU] via SUBCUTANEOUS
  Administered 2021-05-31: 2 [IU] via SUBCUTANEOUS
  Administered 2021-06-01: 3 [IU] via SUBCUTANEOUS
  Administered 2021-06-01 (×2): 2 [IU] via SUBCUTANEOUS
  Administered 2021-06-02: 1 [IU] via SUBCUTANEOUS
  Administered 2021-06-02: 5 [IU] via SUBCUTANEOUS
  Administered 2021-06-02 – 2021-06-03 (×2): 2 [IU] via SUBCUTANEOUS
  Administered 2021-06-03: 1 [IU] via SUBCUTANEOUS
  Administered 2021-06-04 – 2021-06-05 (×3): 3 [IU] via SUBCUTANEOUS
  Administered 2021-06-05: 2 [IU] via SUBCUTANEOUS
  Administered 2021-06-05: 3 [IU] via SUBCUTANEOUS
  Administered 2021-06-06: 7 [IU] via SUBCUTANEOUS
  Administered 2021-06-06: 2 [IU] via SUBCUTANEOUS
  Administered 2021-06-06 – 2021-06-07 (×2): 3 [IU] via SUBCUTANEOUS
  Administered 2021-06-07: 1 [IU] via SUBCUTANEOUS
  Administered 2021-06-07: 2 [IU] via SUBCUTANEOUS
  Administered 2021-06-08: 5 [IU] via SUBCUTANEOUS
  Administered 2021-06-08: 3 [IU] via SUBCUTANEOUS
  Administered 2021-06-08: 5 [IU] via SUBCUTANEOUS
  Administered 2021-06-09: 2 [IU] via SUBCUTANEOUS

## 2021-05-25 MED ORDER — EZETIMIBE 10 MG PO TABS
10.0000 mg | ORAL_TABLET | Freq: Every day | ORAL | Status: DC
  Administered 2021-05-26 – 2021-06-09 (×15): 10 mg via ORAL
  Filled 2021-05-25 (×15): qty 1

## 2021-05-25 MED ORDER — ALLOPURINOL 300 MG PO TABS
300.0000 mg | ORAL_TABLET | Freq: Every day | ORAL | Status: DC
Start: 2021-05-25 — End: 2021-05-25

## 2021-05-25 MED ORDER — EMPAGLIFLOZIN 25 MG PO TABS
25.0000 mg | ORAL_TABLET | Freq: Every day | ORAL | Status: DC
  Administered 2021-05-26 – 2021-06-05 (×11): 25 mg via ORAL
  Filled 2021-05-25 (×12): qty 1

## 2021-05-25 MED ORDER — ATORVASTATIN CALCIUM 40 MG PO TABS
40.0000 mg | ORAL_TABLET | Freq: Every day | ORAL | Status: DC
  Administered 2021-05-26 – 2021-06-09 (×15): 40 mg via ORAL
  Filled 2021-05-25 (×15): qty 1

## 2021-05-25 MED ORDER — DOXAZOSIN MESYLATE 4 MG PO TABS
4.0000 mg | ORAL_TABLET | Freq: Every day | ORAL | Status: DC
  Administered 2021-05-26 – 2021-05-27 (×2): 4 mg via ORAL
  Filled 2021-05-25 (×4): qty 1

## 2021-05-25 MED ORDER — ENOXAPARIN SODIUM 40 MG/0.4ML IJ SOSY
40.0000 mg | PREFILLED_SYRINGE | INTRAMUSCULAR | Status: DC
  Administered 2021-05-25 – 2021-06-08 (×15): 40 mg via SUBCUTANEOUS
  Filled 2021-05-25 (×15): qty 0.4

## 2021-05-25 MED ORDER — CLOPIDOGREL BISULFATE 75 MG PO TABS
75.0000 mg | ORAL_TABLET | Freq: Every day | ORAL | Status: DC
  Administered 2021-05-26 – 2021-06-09 (×15): 75 mg via ORAL
  Filled 2021-05-25 (×15): qty 1

## 2021-05-25 MED ORDER — ACETAMINOPHEN 650 MG RE SUPP: 650.0000 mg | RECTAL | Status: DC | PRN

## 2021-05-25 MED ORDER — SODIUM BICARBONATE 650 MG PO TABS
650.0000 mg | ORAL_TABLET | Freq: Two times a day (BID) | ORAL | Status: AC
  Administered 2021-05-25 – 2021-05-26 (×3): 650 mg via ORAL
  Filled 2021-05-25 (×3): qty 1

## 2021-05-25 MED ORDER — ASPIRIN EC 81 MG PO TBEC
81.0000 mg | DELAYED_RELEASE_TABLET | Freq: Every day | ORAL | Status: DC
  Administered 2021-05-26 – 2021-06-09 (×15): 81 mg via ORAL
  Filled 2021-05-25 (×15): qty 1

## 2021-05-25 MED ORDER — ACETAMINOPHEN 325 MG PO TABS
650.0000 mg | ORAL_TABLET | ORAL | Status: DC | PRN
  Administered 2021-05-28 – 2021-06-08 (×12): 650 mg via ORAL
  Filled 2021-05-25 (×13): qty 2

## 2021-05-25 MED ORDER — ACETAMINOPHEN 160 MG/5ML PO SOLN: 650.0000 mg | ORAL | Status: DC | PRN

## 2021-05-25 MED ORDER — LEVOTHYROXINE SODIUM 88 MCG PO TABS: 88.0000 ug | ORAL_TABLET | Freq: Every day | ORAL | Status: DC

## 2021-05-25 MED ORDER — LOSARTAN POTASSIUM 50 MG PO TABS
50.0000 mg | ORAL_TABLET | Freq: Every day | ORAL | Status: DC
  Administered 2021-05-26 – 2021-06-01 (×7): 50 mg via ORAL
  Filled 2021-05-25 (×7): qty 1

## 2021-05-25 MED ORDER — MELATONIN 10 MG PO TABS
1.0000 | ORAL_TABLET | Freq: Every day | ORAL | Status: DC
Start: 2021-05-25 — End: 2021-05-25

## 2021-05-25 MED ORDER — LEVOTHYROXINE SODIUM 88 MCG PO TABS
88.0000 ug | ORAL_TABLET | Freq: Every day | ORAL | Status: DC
Start: 2021-05-26 — End: 2021-06-09
  Administered 2021-05-26 – 2021-06-08 (×14): 88 ug via ORAL
  Filled 2021-05-25 (×15): qty 1

## 2021-05-25 NOTE — H&P (Signed)
? ? ?Physical Medicine and Rehabilitation Admission H&P ? ?CC: Left pontine CVA ? ?HPI: Alan Rubio is a 88 year old right-handed male with history of hypertension, hyperlipidemia, atrial fibrillation, CVA 2020 status post loop recorder maintained on low-dose aspirin, hypothyroidism, type 2 diabetes mellitus.  Per chart review patient lives with spouse.  Modified independent with occasional use of a cane.  Two-level home bed and bath upstairs patient does have a chair lift on the stairs.  Presented 05/22/2021 with acute onset of slurred speech and bilateral lower extremity weakness.  EMS arrived blood pressure 183/77.  Cranial CT scan showed no acute intracranial abnormality.  Sequela of prior hemorrhage in the right frontal lobe and chronic microvascular ischemic disease.  Patient did not receive tPA.  Chest x-ray negative.  MRI of the brain showed acute perforator infarct of the parasagittal left pons.  Chronic infarcts and chronic microvascular ischemic changes.  MRA showed no proximal intracranial vessel occlusion.  Carotid Dopplers with no ICA stenosis.  Admission chemistries unremarkable except glucose 151, hemoglobin A1c 7.7.  Echocardiogram with ejection fraction of 60 to 65% no wall motion abnormalities grade 1 diastolic dysfunction.  Neurology follow-up maintained on aspirin 81 mg daily and Plavix 75 mg daily for CVA prophylaxis x3 weeks then Plavix alone.  Interrogation of his loop recorder revealed no atrial fibrillation.  Subcutaneous Lovenox for DVT prophylaxis.  Tolerating a regular consistency diet.  Therapy evaluations completed due to patient decreased functional mobility lower extremity weakness and dysarthric speech was admitted for a comprehensive rehab program. As per children, patient lives with wife who has some dementia.  ? ?Review of Systems  ?Constitutional:  Negative for chills and fever.  ?HENT:  Positive for hearing loss.   ?Eyes:  Negative for blurred vision and double vision.   ?Respiratory:  Negative for cough and shortness of breath.   ?Cardiovascular:  Positive for palpitations. Negative for chest pain and leg swelling.  ?Gastrointestinal:  Positive for constipation. Negative for heartburn, nausea and vomiting.  ?Genitourinary:  Positive for urgency. Negative for dysuria, flank pain and hematuria.  ?Musculoskeletal:  Positive for joint pain and myalgias.  ?Skin:  Negative for rash.  ?Neurological:  Positive for speech change and weakness.  ?All other systems reviewed and are negative. ?Past Medical History:  ?Diagnosis Date  ? A-fib ([place])   ? Cancer [facility])   ? Diabetes mellitus without complication ([place])   ? Encounter for loop recorder check 09/15/2018  ? H/O: stroke 04/14/2018  ? Hyperlipemia 06/13/2018  ? Hypertension   ? Hypothyroidism 06/13/2018  ? 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 Remote loop recorder check  9.15.20: 1 false AF=SB/1st deg AVB/PACs  ? Stroke [facility])   ? Thyroid disease   ? ?Past Surgical History:  ?Procedure Laterality Date  ? BACK SURGERY    ? LOOP RECORDER IMPLANT    ? ?Family History  ?Problem Relation Age of Onset  ? Stroke Mother   ? Aneurysm Father   ? Cancer Brother   ? ?Social History:  reports that he has never smoked. He has never used smokeless tobacco. He reports current alcohol use. He reports that he does not use drugs. ?Allergies:  ?Allergies  ?Allergen Reactions  ? Penicillins Rash  ?  Did it involve swelling of the face/tongue/throat, SOB, or low BP? Yes ?Did it involve sudden or severe rash/hives, skin peeling, or any reaction on the inside of your mouth or nose? No ?Did you need to seek medical attention at  a hospital or doctor's office? No ?When did it last happen?       ?If all above answers are "NO", may proceed with cephalosporin use. ? ? ?  ? ?Medications Prior to Admission  ?Medication Sig Dispense Refill  ? ACCU-CHEK AVIVA PLUS test strip daily.    ? allopurinol (ZYLOPRIM) 300 MG tablet Take 300 mg by mouth  daily.    ? aspirin EC 81 MG tablet Take 81 mg by mouth daily.    ? doxazosin (CARDURA) 8 MG tablet Take 4 mg by mouth daily. 1/2 QD    ? empagliflozin (JARDIANCE) 25 MG TABS tablet Take by mouth daily.    ? ezetimibe (ZETIA) 10 MG tablet Take 10 mg by mouth daily.    ? levothyroxine (SYNTHROID) 88 MCG tablet Take 88 mcg by mouth daily before breakfast.    ? losartan (COZAAR) 50 MG tablet Take 50 mg by mouth daily.    ? Melatonin 10 MG TABS Take 1 tablet by mouth at bedtime.    ? pioglitazone (ACTOS) 15 MG tablet Take 15 mg by mouth daily.    ? ?Home: ?Home Living ?Family/patient expects to be discharged to:: Private residence ?Living Arrangements: Spouse/significant other ?Available Help at Discharge: Family, Available 24 hours/day ?Type of Home: House ?Home Access: Stairs to enter ?Entrance Stairs-Number of Steps: 2 ?Entrance Stairs-Rails: Right ?Home Layout: Two level, Bed/bath upstairs ?Alternate Level Stairs-Number of Steps: Pt has a chair lift on flight of stairs. ?Bathroom Shower/Tub: Walk-in shower ?Bathroom Toilet: Handicapped height ?Bathroom Accessibility: Yes ?Home Equipment: [name] - single point, Shower seat ?  ?Functional History: ?Prior Function ?Prior Level of Function : Independent/Modified Independent ?Mobility Comments: amb household distances, occasional use of cane, primarily furniture walks ?ADLs Comments: Indep ?  ?Functional Status:  ?Mobility: ?Bed Mobility ?Overal bed mobility: Needs Assistance ?Bed Mobility: Supine to Sit, Sit to Supine ?Supine to sit: Mod assist, HOB elevated ?Sit to supine: Mod assist ?General bed mobility comments: +rail, increased time, use of bed pad to scoot to EOB, heavy mod to bring trunk up to sitting, mod o bring BLE back into bed ?Transfers ?Overall transfer level: Needs assistance ?Equipment used: Rolling walker (2 wheels) ?Transfers: Sit to/from Stand ?Sit to Stand: Mod assist ?General transfer comment: cues for hand placement, assist to power  up ?Ambulation/Gait ?Ambulation/Gait assistance: Min assist ?Gait Distance (Feet): 50 Feet ?Assistive device: Rolling walker (2 wheels) ?Gait Pattern/deviations: Step-through pattern, Decreased stride length, Drifts right/left ?General Gait Details: improved gait stability with RW ?Gait velocity: decreased ?Gait velocity interpretation: <1.31 ft/sec, indicative of household ambulator ?  ?ADL: ?ADL ?Overall ADL's : Needs assistance/impaired ?Eating/Feeding: Set up, Sitting ?Grooming: Set up, Sitting ?Upper Body Bathing: Minimal assistance, Sitting ?Lower Body Bathing: Maximal assistance, Sitting/lateral leans, Sit to/from stand ?Upper Body Dressing : Minimal assistance, Sitting ?Lower Body Dressing: Maximal assistance, Sitting/lateral leans, Sit to/from stand ?Toilet Transfer: Moderate assistance, Stand-pivot ?Toileting- Clothing Manipulation and Hygiene: Maximal assistance, Sitting/lateral lean, Sit to/from stand ?Functional mobility during ADLs: Moderate assistance, Rolling walker (2 wheels) ?General ADL Comments: Pt with increased weakness and mild ataxia noted, requiring max A for LB ADL's and min A for UB ADL's. ?  ?Cognition: ?Cognition ?Overall Cognitive Status: Within Functional Limits for tasks assessed ?Orientation Level: Oriented X4 ?Cognition ?Arousal/Alertness: Awake/alert ?Behavior During Therapy: WFL for tasks assessed/performed (labile when family came in room) ?Overall Cognitive Status: Within Functional Limits for tasks assessed ?General Comments: Follows all commands and is aware of safety/deficits ?  ?Physical Exam: ?Blood pressure 133/66,  pulse 70, temperature 97.8 ?F (36.6 ?C), temperature source Oral, resp. rate 19, height 6' (1.829 m), weight 75.4 kg, SpO2 98 %. ?Physical Exam ?Gen: no distress, normal appearing, BMI 22.54, 2 children at bedside ?HEENT: oral mucosa pink and moist, NCAT, hard of hearing ?Cardio: Reg rate ?Chest: normal effort, normal rate of breathing ?Abd: soft,  non-distended ?Ext: no edema ?Psych: pleasant, normal affect ?Skin: intact, no IV ?GU: foley in place, draining yellow urine ?Neurological:  ?   Comments: Patient is alert.  No acute distress.  Makes eye contact with examiner.  Provides name and

## 2021-05-25 NOTE — Progress Notes (Signed)
Inpatient Rehab Admissions Coordinator:  ?There is a bed available for pt in CIR today. Dr. Grandville Silos aware and in agreement. Pt, pt's wife, NSG, and TOC aware. ? ? ?Gayland Curry, MS, CCC-SLP ?Admissions Coordinator ?[phone] ? ?

## 2021-05-25 NOTE — Discharge Instructions (Addendum)
Inpatient Rehab Discharge Instructions ? ?Alan Rubio ?Discharge date and time: No discharge date for patient encounter.  ? ?Activities/Precautions/ Functional Status: ?Activity: As tolerated ?Diet: Diabetic diet ?Wound Care: Routine skin checks ?Functional status:  ?___ No restrictions     ___ Walk up steps independently ?___ 24/7 supervision/assistance   ___ Walk up steps with assistance ?___ Intermittent supervision/assistance  ___ Bathe/dress independently ?___ Walk with walker     _x__ Bathe/dress with assistance ?___ Walk Independently    ___ Shower independently ?___ Walk with assistance    ___ Shower with assistance ?___ No alcohol     ___ Return to work/school ________ ? ?Special Instructions: ?No driving smoking or alcohol ? ?Continue aspirin 81 mg daily and Plavix 75 mg day x3 weeks then Plavix alone ? ? ?My questions have been answered and I understand these instructions. I will adhere to these goals and the provided educational materials after my discharge from the hospital. ? ?Patient/Caregiver Signature _______________________________ Date __________ ? ?Clinician Signature _______________________________________ Date __________ ? ?Please bring this form and your medication list with you to all your follow-up doctor's appointments.  STROKE/TIA DISCHARGE INSTRUCTIONS ?SMOKING Cigarette smoking nearly doubles your risk of having a stroke & is the single most alterable risk factor  ?If you smoke or have smoked in the last 12 months, you are advised to quit smoking for your health. Most of the excess cardiovascular risk related to smoking disappears within a year of stopping. ?Ask you doctor about anti-smoking medications ?[place] Quit Line: 1-800-QUIT NOW ?Free Smoking Cessation Classes [phone]  ?CHOLESTEROL Know your levels; limit fat & cholesterol in your diet  ?Lipid Panel  ?   ?Component Value Date/Time  ? CHOL 163 05/22/2021 1624  ? TRIG 49 05/22/2021 1624  ? HDL 47 05/22/2021 1624  ? CHOLHDL 3.5  05/22/2021 1624  ? VLDL 10 05/22/2021 1624  ? [place] 106 (H) 05/22/2021 1624  ? ? ? Many patients benefit from treatment even if their cholesterol is at goal. ?Goal: Total Cholesterol (CHOL) less than 160 ?Goal:  Triglycerides (TRIG) less than 150 ?Goal:  HDL greater than 40 ?Goal:  LDL (LDLCALC) less than 100 ?  ?BLOOD PRESSURE American Stroke Association blood pressure target is less that 120/80 mm/Hg  ?Your discharge blood pressure is:  BP: (!) 166/71 Monitor your blood pressure ?Limit your salt and alcohol intake ?Many individuals will require more than one medication for high blood pressure  ?DIABETES (A1c is a blood sugar average for last 3 months) Goal HGBA1c is under 7% (HBGA1c is blood sugar average for last 3 months)  ?Diabetes: ?  ? ?Lab Results  ?Component Value Date  ? HGBA1C 7.7 (H) 05/22/2021  ? ? Your HGBA1c can be lowered with medications, healthy diet, and exercise. ?Check your blood sugar as directed by your physician ?Call your physician if you experience unexplained or low blood sugars.  ?PHYSICAL ACTIVITY/REHABILITATION Goal is 30 minutes at least 4 days per week  ?Activity: Increase activity slowly, ?Therapies: Physical Therapy: Home Health ?Return to work:  Activity decreases your risk of heart attack and stroke and makes your heart stronger.  It helps control your weight and blood pressure; helps you relax and can improve your mood. ?Participate in a regular exercise program. ?Talk with your doctor about the best form of exercise for you (dancing, walking, swimming, cycling).  ?DIET/WEIGHT Goal is to maintain a healthy weight  ?Your discharge diet is:  ?Diet Order   ? ?       ?  Diet heart healthy/carb modified Room service appropriate? Yes; Fluid consistency: Thin  Diet effective now       ?  ? ?  ?  ? ?  ?  liquids ?Your height is:  Height: 6' (182.9 cm) ?Your current weight is: Weight: 75.4 kg ?Your Body Mass Index (BMI) is:  BMI (Calculated): 22.54 Following the type of diet  specifically designed for you will help prevent another stroke. ?Your goal weight range is:   ?Your goal Body Mass Index (BMI) is 19-24. ?Healthy food habits can help reduce 3 risk factors for stroke:  High cholesterol, hypertension, and excess weight.  ?RESOURCES Stroke/Support Group:  Call [phone] ?  ?STROKE EDUCATION PROVIDED/REVIEWED AND GIVEN TO PATIENT Stroke warning signs and symptoms ?How to activate emergency medical system (call 911). ?Medications prescribed at discharge. ?Need for follow-up after discharge. ?Personal risk factors for stroke. ?Pneumonia vaccine given: No ?Flu vaccine given: No ?My questions have been answered, the writing is legible, and I understand these instructions.  I will adhere to these goals & educational materials that have been provided to me after my discharge from the hospital.  ? ?  ?

## 2021-05-25 NOTE — H&P (Incomplete)
? ? ?Physical Medicine and Rehabilitation Admission H&P ? ?  ?Chief Complaint  ?Patient presents with  ? Code Stroke  ?: ?HPI: Alan Rubio is a 88 year old right-handed male with history of hypertension, hyperlipidemia, atrial fibrillation, CVA 2020 status post loop recorder maintained on low-dose aspirin, hypothyroidism, type 2 diabetes mellitus.  Per chart review patient lives with spouse.  Modified independent with occasional use of a cane.  Two-level home bed and bath upstairs patient does have a chair lift on the stairs.  Presented 05/22/2021 with acute onset of slurred speech and bilateral lower extremity weakness.  EMS arrived blood pressure 183/77.  Cranial CT scan showed no acute intracranial abnormality.  Sequela of prior hemorrhage in the right frontal lobe and chronic microvascular ischemic disease.  Patient did not receive tPA.  Chest x-ray negative.  MRI of the brain showed acute perforator infarct of the parasagittal left pons.  Chronic infarcts and chronic microvascular ischemic changes.  MRA showed no proximal intracranial vessel occlusion.  Carotid Dopplers with no ICA stenosis.  Admission chemistries unremarkable except glucose 151, hemoglobin A1c 7.7.  Echocardiogram with ejection fraction of 60 to 65% no wall motion abnormalities grade 1 diastolic dysfunction.  Neurology follow-up maintained on aspirin 81 mg daily and Plavix 75 mg daily for CVA prophylaxis x3 weeks then Plavix alone.  Interrogation of his loop recorder revealed no atrial fibrillation.  Subcutaneous Lovenox for DVT prophylaxis.  Tolerating a regular consistency diet.  Therapy evaluations completed due to patient decreased functional mobility lower extremity weakness and dysarthric speech was admitted for a comprehensive rehab program. ? ?Review of Systems  ?Constitutional:  Negative for chills and fever.  ?HENT:  Positive for hearing loss.   ?Eyes:  Negative for blurred vision and double vision.  ?Respiratory:  Negative for cough  and shortness of breath.   ?Cardiovascular:  Positive for palpitations. Negative for chest pain and leg swelling.  ?Gastrointestinal:  Positive for constipation. Negative for heartburn, nausea and vomiting.  ?Genitourinary:  Positive for urgency. Negative for dysuria, flank pain and hematuria.  ?Musculoskeletal:  Positive for joint pain and myalgias.  ?Skin:  Negative for rash.  ?Neurological:  Positive for speech change and weakness.  ?All other systems reviewed and are negative. ?Past Medical History:  ?Diagnosis Date  ? A-fib ([place])   ? Cancer [facility])   ? Diabetes mellitus without complication ([place])   ? Encounter for loop recorder check 09/15/2018  ? H/O: stroke 04/14/2018  ? Hyperlipemia 06/13/2018  ? Hypertension   ? Hypothyroidism 06/13/2018  ? 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 Remote loop recorder check  9.15.20: 1 false AF=SB/1st deg AVB/PACs  ? Stroke [facility])   ? Thyroid disease   ? ?Past Surgical History:  ?Procedure Laterality Date  ? BACK SURGERY    ? LOOP RECORDER IMPLANT    ? ?Family History  ?Problem Relation Age of Onset  ? Stroke Mother   ? Aneurysm Father   ? Cancer Brother   ? ?Social History:  reports that he has never smoked. He has never used smokeless tobacco. He reports current alcohol use. He reports that he does not use drugs. ?Allergies:  ?Allergies  ?Allergen Reactions  ? Penicillins Rash  ?  Did it involve swelling of the face/tongue/throat, SOB, or low BP? Yes ?Did it involve sudden or severe rash/hives, skin peeling, or any reaction on the inside of your mouth or nose? No ?Did you need to seek medical attention at a hospital or  doctor's office? No ?When did it last happen?       ?If all above answers are "NO", may proceed with cephalosporin use. ? ? ?  ? ?Medications Prior to Admission  ?Medication Sig Dispense Refill  ? allopurinol (ZYLOPRIM) 300 MG tablet Take 300 mg by mouth daily.    ? aspirin EC 81 MG tablet Take 81 mg by mouth daily.    ? doxazosin  (CARDURA) 8 MG tablet Take 4 mg by mouth daily. 1/2 QD    ? empagliflozin (JARDIANCE) 25 MG TABS tablet Take by mouth daily.    ? ezetimibe (ZETIA) 10 MG tablet Take 10 mg by mouth daily.    ? levothyroxine (SYNTHROID) 88 MCG tablet Take 88 mcg by mouth daily before breakfast.    ? losartan (COZAAR) 50 MG tablet Take 50 mg by mouth daily.    ? Melatonin 10 MG TABS Take 1 tablet by mouth at bedtime.    ? pioglitazone (ACTOS) 15 MG tablet Take 15 mg by mouth daily.    ? ACCU-CHEK AVIVA PLUS test strip daily.    ? ? ? ? ?Home: ?Home Living ?Family/patient expects to be discharged to:: Private residence ?Living Arrangements: Spouse/significant other ?Available Help at Discharge: Family, Available 24 hours/day ?Type of Home: House ?Home Access: Stairs to enter ?Entrance Stairs-Number of Steps: 2 ?Entrance Stairs-Rails: Right ?Home Layout: Two level, Bed/bath upstairs ?Alternate Level Stairs-Number of Steps: Pt has a chair lift on flight of stairs. ?Bathroom Shower/Tub: Walk-in shower ?Bathroom Toilet: Handicapped height ?Bathroom Accessibility: Yes ?Home Equipment: [name] - single point, Shower seat ?  ?Functional History: ?Prior Function ?Prior Level of Function : Independent/Modified Independent ?Mobility Comments: amb household distances, occasional use of cane, primarily furniture walks ?ADLs Comments: Indep ? ?Functional Status:  ?Mobility: ?Bed Mobility ?Overal bed mobility: Needs Assistance ?Bed Mobility: Supine to Sit, Sit to Supine ?Supine to sit: Mod assist, HOB elevated ?Sit to supine: Mod assist ?General bed mobility comments: +rail, increased time, use of bed pad to scoot to EOB, heavy mod to bring trunk up to sitting, mod o bring BLE back into bed ?Transfers ?Overall transfer level: Needs assistance ?Equipment used: Rolling walker (2 wheels) ?Transfers: Sit to/from Stand ?Sit to Stand: Mod assist ?General transfer comment: cues for hand placement, assist to power up ?Ambulation/Gait ?Ambulation/Gait  assistance: Min assist ?Gait Distance (Feet): 50 Feet ?Assistive device: Rolling walker (2 wheels) ?Gait Pattern/deviations: Step-through pattern, Decreased stride length, Drifts right/left ?General Gait Details: improved gait stability with RW ?Gait velocity: decreased ?Gait velocity interpretation: <1.31 ft/sec, indicative of household ambulator ?  ? ?ADL: ?ADL ?Overall ADL's : Needs assistance/impaired ?Eating/Feeding: Set up, Sitting ?Grooming: Set up, Sitting ?Upper Body Bathing: Minimal assistance, Sitting ?Lower Body Bathing: Maximal assistance, Sitting/lateral leans, Sit to/from stand ?Upper Body Dressing : Minimal assistance, Sitting ?Lower Body Dressing: Maximal assistance, Sitting/lateral leans, Sit to/from stand ?Toilet Transfer: Moderate assistance, Stand-pivot ?Toileting- Clothing Manipulation and Hygiene: Maximal assistance, Sitting/lateral lean, Sit to/from stand ?Functional mobility during ADLs: Moderate assistance, Rolling walker (2 wheels) ?General ADL Comments: Pt with increased weakness and mild ataxia noted, requiring max A for LB ADL's and min A for UB ADL's. ? ?Cognition: ?Cognition ?Overall Cognitive Status: Within Functional Limits for tasks assessed ?Orientation Level: Oriented X4 ?Cognition ?Arousal/Alertness: Awake/alert ?Behavior During Therapy: WFL for tasks assessed/performed (labile when family came in room) ?Overall Cognitive Status: Within Functional Limits for tasks assessed ?General Comments: Follows all commands and is aware of safety/deficits ? ?Physical Exam: ?Blood pressure (!) 151/60, pulse  62, temperature 98.5 ?F (36.9 ?C), temperature source Oral, resp. rate 15, height 6' (1.829 m), weight 80.8 kg, SpO2 99 %. ?Physical Exam ?Neurological:  ?   Comments: Patient is alert.  No acute distress.  Makes eye contact with examiner.  Provides name and age.  Speech is a bit dysarthric but intelligible.  ? ? ?Results for orders placed or performed during the hospital encounter of  05/22/21 (from the past 48 hour(s))  ?Basic metabolic panel     Status: Abnormal  ? Collection Time: 05/24/21  1:33 AM  ?Result Value Ref Range  ? Sodium 137 135 - 145 mmol/L  ? Potassium 3.7 3.5 - 5.1 mmol/L  ? Chlorid

## 2021-05-25 NOTE — Progress Notes (Signed)
INPATIENT REHABILITATION ADMISSION NOTE ? ? ?Arrival Method: WC ? ?   ?Mental Orientation: Oriented x3 ? ? ?Assessment: done ? ? ?Skin: done ? ? ?IV'S: none ? ? ?Pain: none ? ? ?Tubes and Drains: none ? ? ?Safety Measures: reviewed with pt family ? ? ?Vital Signs: done ? ? ?Height and Weight: done ? ? ?Rehab Orientation: done ? ? ?Family: at bedside ? ? ? ?Notes: done ? ?Gerald Stabs, RN ?  ?

## 2021-05-25 NOTE — Progress Notes (Addendum)
Physical Therapy Treatment ?Patient Details ?Name: Alan Rubio ?MRN: [id number] ?DOB: 31-May-1931 ?Today's Date: 05/25/2021 ? ? ?History of Present Illness Pt is an 88 y.o. male admitted 4/28 following a fall at home with c/o BLE weakness and slurred speech. 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  PMH:  hypertension, hyperlipidemia, atrial fibrillation, CVA in 2022, hypothyroidism, and DM type II ? ?  ?PT Comments  ? ? Pt received in supine, pleasantly agreeable to therapy session with emphasis on transfer and gait training. Pt with good effort for all functional mobility tasks and slow processing, needing mod cues for upright posture and RW use. Pt needing +2 modA for stand pivot transfer with B HHA and +1 modA for sit<>stand from bed x3 reps to RW and +1 minA for gait progression short distance into hallway and back. Per charge RN, plan for DC today to acute inpatient rehab so pt left sitting up in transport chair at end of session, family present and RN notified. Disposition updated below per pt progress and motivated to participate in high-intensity rehab, family present/supportive and agreable, discussed with supervising PT Ginger Carne.  ?Recommendations for follow up therapy are one component of a multi-disciplinary discharge planning process, led by the attending physician.  Recommendations may be updated based on patient status, additional functional criteria and insurance authorization. ? ?Follow Up Recommendations ? Acute inpatient rehab (3hours/day) ?  ?  ?Assistance Recommended at Discharge Frequent or constant Supervision/Assistance  ?Patient can return home with the following A little help with walking and/or transfers;A lot of help with bathing/dressing/bathroom;Assistance with cooking/housework;Assist for transportation;Help with stairs or ramp for entrance ?  ?Equipment Recommendations ? Rolling walker (2 wheels)  ?  ?Recommendations for Other Services   ? ? ?  ?Precautions /  Restrictions Precautions ?Precautions: Fall ?Restrictions ?Weight Bearing Restrictions: No  ?  ? ?Mobility ? Bed Mobility ?Overal bed mobility: Needs Assistance ?Bed Mobility: Supine to Sit ?  ?  ?Supine to sit: HOB elevated, Min assist ?  ?  ?General bed mobility comments: +rail, increased time and cues needs minA for trunk raise; no chuck pad in bed during session; pt able to scoot anterior to foot flat with minA for hips/trunk and heavy reliance on bed rail ?  ? ?Transfers ?Overall transfer level: Needs assistance ?Equipment used: Rolling walker (2 wheels) ?Transfers: Sit to/from Stand, Bed to chair/wheelchair/BSC ?Sit to Stand: Mod assist, Max assist ?  ?Step pivot transfers: Mod assist, +2 physical assistance ?  ?  ?  ?General transfer comment: cues for hand placement, assist to power up; from EOB x2 reps to RW then third rep from EOB>wheelchair via HHA for stand pivot needing increased +2 modA ?  ? ?Ambulation/Gait ?Ambulation/Gait assistance: Min assist ?Gait Distance (Feet): 50 Feet ?Assistive device: Rolling walker (2 wheels) ?Gait Pattern/deviations: Step-to pattern, Step-through pattern, Decreased stride length, Leaning posteriorly, Trunk flexed ?Gait velocity: decreased ?Gait velocity interpretation: <1.31 ft/sec, indicative of household ambulator ?  ?General Gait Details: pt needing +2 mod/maxA for pivotal steps without RW with BUE support at forearms and gait belt from bed>chair; +1 minA once standing for short gait trial, pt with very small steps and tending to hold RW anterior outside BOS, mod cues for upright posture/gaze and improved step sequencing with RW ? ? ?Stairs ?  ?  ?  ?  ?  ? ? ?Wheelchair Mobility ?  ? ?Modified Rankin (Stroke Patients Only) ?  ? ? ?  ?Balance Overall balance assessment: Needs  assistance ?Sitting-balance support: Feet supported, Bilateral upper extremity supported ?Sitting balance-Leahy Scale: Fair ?  ?  ?Standing balance support: Bilateral upper extremity supported,  During functional activity, Reliant on assistive device for balance ?Standing balance-Leahy Scale: Poor ?Standing balance comment: posterior lean in standing initially, able to correct with cuing and increased time and heavy reliance on RW; increased posterior lean with BUE HHA while pivoting from bed>chair ?  ?  ?  ?  ?  ?  ?  ?  ?  ?  ?  ?  ? ?  ?Cognition Arousal/Alertness: Awake/alert ?Behavior During Therapy: [facility] for tasks assessed/performed ?Overall Cognitive Status: Within Functional Limits for tasks assessed ?  ?  ?  ?  ?  ?  ?  ?  ?  ?  ?  ?  ?  ?  ?  ?  ?General Comments: Follows all commands and is aware of safety/deficits, slow processing (unsure of baseline); family present and supportive standing outside room during session ?  ?  ? ?  ?Exercises Other Exercises ?Other Exercises: supine BLE AROM: ankle pumps, heel slides, hip abduction x5-10 reps ea ?Other Exercises: STS x 3 reps ? ?  ?General Comments General comments (skin integrity, edema, etc.): VSS on RA, HR 100 bpm post-exertion;  BP WFL per chart review and no dizziness reported, not further assessed ?  ?  ? ?Pertinent Vitals/Pain    ? ? ?Home Living   ?  ?  ?  ?  ?  ?  ?  ?  ?  ?   ?  ?Prior Function    ?  ?  ?   ? ?PT Goals (current goals can now be found in the care plan section) Acute Rehab PT Goals ?Patient Stated Goal: home ?PT Goal Formulation: With patient/family ?Time For Goal Achievement: 06/06/21 ?Progress towards PT goals: Progressing toward goals ? ?  ?Frequency ? ? ? Min 3X/week ? ? ? ?  ?PT Plan Current plan remains appropriate  ? ? ?Co-evaluation   ?  ?  ?  ?  ? ?  ?AM-PAC PT "6 Clicks" Mobility   ?Outcome Measure ? Help needed turning from your back to your side while in a flat bed without using bedrails?: A Little ?Help needed moving from lying on your back to sitting on the side of a flat bed without using bedrails?: A Lot ?Help needed moving to and from a bed to a chair (including a wheelchair)?: A Lot ?Help needed standing up  from a chair using your arms (e.g., wheelchair or bedside chair)?: A Lot ?Help needed to walk in hospital room?: A Lot (mod cues) ?Help needed climbing 3-5 steps with a railing? : Total ?6 Click Score: 12 ? ?  ?End of Session Equipment Utilized During Treatment: Gait belt ?Activity Tolerance: Patient tolerated treatment well ?Patient left: in chair;with call bell/phone within reach;with family/visitor present;Other (comment) (in transport chair awaiting RN) ?Nurse Communication: Mobility status;Other (comment) (pt up in transport chair ready to DC) ?PT Visit Diagnosis: Other abnormalities of gait and mobility (R26.89);Difficulty in walking, not elsewhere classified (R26.2);Muscle weakness (generalized) (M62.81) ?  ? ? ?Time: 3212-2482 ?PT Time Calculation (min) (ACUTE ONLY): 23 min ? ?Charges:  $Gait Training: 8-22 mins ?$Therapeutic Exercise: 8-22 mins          ?          ? ?Aylene Acoff P., PTA ?Acute Rehabilitation Services ?Secure Chat Preferred 9a-5:30pm ?Office: [phone]  ? ? ?Kara Pacer Raney Antwine ?05/25/2021, 3:53  PM ? ?

## 2021-05-25 NOTE — Plan of Care (Signed)
?  Problem: Education: ?Goal: Knowledge of disease or condition will improve ?Outcome: Adequate for Discharge ?Goal: Knowledge of secondary prevention will improve (SELECT ALL) ?Outcome: Adequate for Discharge ?Goal: Knowledge of patient specific risk factors will improve (INDIVIDUALIZE FOR PATIENT) ?Outcome: Adequate for Discharge ?Goal: Individualized Educational Video(s) ?Outcome: Adequate for Discharge ?  ?Problem: Coping: ?Goal: Will verbalize positive feelings about self ?Outcome: Adequate for Discharge ?Goal: Will identify appropriate support needs ?Outcome: Adequate for Discharge ?  ?Problem: Health Behavior/Discharge Planning: ?Goal: Ability to manage health-related needs will improve ?Outcome: Adequate for Discharge ?  ?Problem: Self-Care: ?Goal: Verbalization of feelings and concerns over difficulty with self-care will improve ?Outcome: Adequate for Discharge ?Goal: Ability to communicate needs accurately will improve ?Outcome: Adequate for Discharge ?  ?Problem: Nutrition: ?Goal: Risk of aspiration will decrease ?Outcome: Adequate for Discharge ?  ?Problem: Ischemic Stroke/TIA Tissue Perfusion: ?Goal: Complications of ischemic stroke/TIA will be minimized ?Outcome: Adequate for Discharge ?  ?Problem: Health Behavior/Discharge Planning: ?Goal: Ability to manage health-related needs will improve ?Outcome: Adequate for Discharge ?  ?Problem: Safety: ?Goal: Ability to remain free from injury will improve ?Outcome: Adequate for Discharge ?  ?Problem: Skin Integrity: ?Goal: Risk for impaired skin integrity will decrease ?Outcome: Adequate for Discharge ?  ?

## 2021-05-25 NOTE — Discharge Summary (Signed)
Physician Discharge Summary  ?Alan Rubio WNU:[id number] DOB: 1931-04-16 DOA: 05/22/2021 ? ?PCP: [name], MD ? ?Admit date: 05/22/2021 ?Discharge date: 05/25/2021 ? ?Time spent: 55 minutes ? ?Recommendations for Outpatient Follow-up:  ?Patient was discharged to inpatient rehab. ?Follow-up with [facility] in 4 weeks. ?Follow-up with [name], MD 2 weeks postdischarge. ? ? ?Discharge Diagnoses:  ?Principal Problem: ?  CVA (cerebral vascular accident) ([place]) ?Active Problems: ?  HTN (hypertension) ?  Type 2 diabetes mellitus ([place]) ?  Hyperlipemia ?  Hypothyroidism ?  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 ? ?Discharge Condition: Stable and improved ? ?Diet recommendation: Heart healthy ? ?Filed Weights  ? 05/22/21 1300  ?Weight: 80.8 kg  ? ? ?History of present illness:  ?HPI per Dr Tamala Julian ? Alan Rubio is a 88 y.o. male with medical history significant of hypertension, hyperlipidemia, atrial fibrillation, CVA in 2022, hypothyroidism, and DM type II presents due to weakness and confusion.  Patient's stepson is present at bedside and helps provide additional history.  Yesterday, the patient had fallen.  EMS was called and able to help the patient get up and no functional deficit was noted at that time.  This morning around 9 AM patient was unable to get out of bed for which EMS was called.  After helping the patient get up he was able to walk without aid issue or focal deficit appreciated.   However, by noon time patient progressively declined with complaints of bilateral leg weakness which patient thinks his right leg is weaker than his left, slurred speech, and reports of confusion. ?  ?Upon admission into the emergency department patient was noted to be afebrile with blood pressures elevated up to 183/77, and all other vital signs stable.  Labs were unremarkable.  Chest x-ray noted no acute abnormality.  MRI of the brain significant for acute perforator infarct of the  parasagittal left pons with chronic infarcts and chronic microvascular ischemic changes appreciated.  TRH called to admit for completion of stroke work-up. ? ?Hospital Course:  ?#1 acute CVA/left pontine infarct likely secondary to small vessel disease ?-Patient presented with lower extremity weakness, dysarthric speech. ?-Head CT done negative for any acute abnormalities. 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 ?-MRA with stenosis of left P1 PCA. ?-Carotid Dopplers done with no significant ICA stenosis. ?-2D echo done with EF of 60 to 03%, grade 1 diastolic dysfunction, no source of emboli noted. ?-LDL of 106.  Hemoglobin A1c 7.7. ?-Permissive hypertension ?-Patient noted to have been on aspirin 81 mg daily prior to admission. ?-Patient seen in consultation by neurology who recommended dual antiplatelet therapy with aspirin and Plavix x3 weeks then Plavix alone. ?-Patient noted to have had a loop recorder which was interrogated and revealed no atrial fibrillation. ?-Patient started on a statin and maintained on home regimen Zetia.  ?-Patient seen by PT who recommended CIR.   ?-Patient seen and followed by neurology throughout the hospitalization.   ?-Outpatient follow-up with neurology.   ?-Patient will be discharged to inpatient rehab. ? ?2.  Diabetes mellitus type 2 ?-Hemoglobin A1c 7.7 (05/22/2021). ?-Patient's Actos was held during the hospitalization  ?-Patient maintained on home regimen of Jardiance and sliding scale insulin.   ?- outpatient follow-up. ? ?3.  Hyperlipidemia ?-LDL of 106. ?-Noted to be on Zetia prior to admission which was continued during the hospitalization.   ?-Patient started on a statin.   ?-Outpatient follow-up with PCP.  ? ?4.  Hypothyroidism ?-TSH  0.990.   ?-Patient maintained on home regimen Synthroid. ? ?5.  Hypertension ?-Continue Cardura. ?-Resume home regimen losartan. ? ?6.  Fall at home ?-Per admitting physician patient noted to have had a fall  prior to admission at home. ?-PT/OT assessed patient and recommended CIR.   ?-Patient with discharge to CIR.  ?  ? ?Procedures: ?Chest x-ray 05/22/2021 ?MRI brain/MRA head 05/22/2021 ?2D echo 05/22/2021 ?Carotid Dopplers 05/23/2021 ? ?Consultations: ?Neurology: Dr.Xu 05/22/2021 ? ?Discharge Exam: ?Vitals:  ? 05/25/21 0839 05/25/21 1129  ?BP: (!) 178/80 (!) 147/69  ?Pulse: 62 62  ?Resp: 20 20  ?Temp: 98 ?F (36.7 ?C) 98.2 ?F (36.8 ?C)  ?SpO2: 99% 98%  ? ? ?General: NAD. ?Cardiovascular: Regular rate rhythm no murmurs rubs or gallops.  No JVD.  No lower extremity edema ?Respiratory: CTA B.  No wheezes, no crackles, no rhonchi. ? ?Discharge Instructions ? ? ?Discharge Instructions   ? ? Ambulatory referral to Neurology   Complete by: As directed ?  ? Follow up with stroke clinic NP ([facility]ica [place] or Cecille Rubin, if both not available, consider Zachery Dauer, or [place]) at [facility] in about 4 weeks. Thanks.  ? Diet - low sodium heart healthy   Complete by: As directed ?  ? Discharge wound care:   Complete by: As directed ?  ? As above.  ? Increase activity slowly   Complete by: As directed ?  ? ?  ? ?Allergies as of 05/25/2021   ? ?   Reactions  ? Penicillins Rash  ? Did it involve swelling of the face/tongue/throat, SOB, or low BP? Yes ?Did it involve sudden or severe rash/hives, skin peeling, or any reaction on the inside of your mouth or nose? No ?Did you need to seek medical attention at a hospital or doctor's office? No ?When did it last happen?       ?If all above answers are "NO", may proceed with cephalosporin use.  ? ?  ? ?  ?Medication List  ?  ? ?ASK your doctor about these medications   ? ?Accu-Chek Aviva Plus test strip ?Generic drug: glucose blood ?daily. ?  ?allopurinol 300 MG tablet ?Commonly known as: ZYLOPRIM ?Take 300 mg by mouth daily. ?  ?aspirin EC 81 MG tablet ?Take 81 mg by mouth daily. ?  ?doxazosin 8 MG tablet ?Commonly known as: CARDURA ?Take 4 mg by mouth daily. 1/2 QD ?  ?empagliflozin 25 MG  Tabs tablet ?Commonly known as: JARDIANCE ?Take by mouth daily. ?Ask about: Which instructions should I use? ?  ?ezetimibe 10 MG tablet ?Commonly known as: ZETIA ?Take 10 mg by mouth daily. ?  ?levothyroxine 88 MCG tablet ?Commonly known as: SYNTHROID ?Take 88 mcg by mouth daily before breakfast. ?Ask about: Which instructions should I use? ?  ?losartan 50 MG tablet ?Commonly known as: COZAAR ?Take 50 mg by mouth daily. ?Ask about: Which instructions should I use? ?  ?Melatonin 10 MG Tabs ?Take 1 tablet by mouth at bedtime. ?  ?pioglitazone 15 MG tablet ?Commonly known as: ACTOS ?Take 15 mg by mouth daily. ?  ? ?  ? ?  ?  ? ? ?  ?Discharge Care Instructions  ?(From admission, onward)  ?  ? ? ?  ? ?  Start     Ordered  ? 05/25/21 0000  Discharge wound care:       ?Comments: As above.  ? 05/25/21 1456  ? ?  ?  ? ?  ? ?Allergies  ?Allergen Reactions  ?  Penicillins Rash  ?  Did it involve swelling of the face/tongue/throat, SOB, or low BP? Yes ?Did it involve sudden or severe rash/hives, skin peeling, or any reaction on the inside of your mouth or nose? No ?Did you need to seek medical attention at a hospital or doctor's office? No ?When did it last happen?       ?If all above answers are "NO", may proceed with cephalosporin use. ? ? ?  ? ? Follow-up Information   ? ? [facility]. Schedule an appointment as soon as possible for a visit in 1 month(s).   ?Specialty: Neurology ?Why: stroke clinic ?Contact information: ?[address] [place] ?[phone] ? ?  ?  ? ? [name], MD. Schedule an appointment as soon as possible for a visit in 2 week(s).   ?Specialty: Family Medicine ?Contact information: ?[address] ?[place] [address] ?[phone] ? ? ?  ?  ? ?  ?  ? ?  ? ? ? ?The results of significant diagnostics from this hospitalization (including imaging, microbiology, ancillary and laboratory) are listed below for reference.   ? ?Significant  Diagnostic Studies: ?MR ANGIO HEAD WO CONTRAST ? ?Result Date: 05/22/2021 ?CLINICAL DATA:  Transient ischemic attack (TIA) EXAM: MRI HEAD WITHOUT CONTRAST MRA HEAD WITHOUT CONTRAST TECHNIQUE: Multiplanar, multi-echo pulse

## 2021-05-25 NOTE — TOC Transition Note (Signed)
Transition of Care (TOC) - CM/SW Discharge Note ? ? ?Patient Details  ?Name: Trinten Boudoin ?MRN: [id number] ?Date of Birth: 1931-10-22 ? ?Transition of Care (TOC) CM/SW Contact:  ?Pollie Friar, RN ?Phone Number: ?05/25/2021, 11:02 AM ? ? ?Clinical Narrative:    ?Patient is discharging to CIR today. CM signing off.  ? ? ?Final next level of care: [place] ?Barriers to Discharge: No Barriers Identified ? ? ?Patient Goals and CMS Choice ?  ?  ?Choice offered to / list presented to : Patient ? ?Discharge Placement ?  ?           ?  ?  ?  ?  ? ?Discharge Plan and Services ?  ?  ?Post Acute Care Choice: IP Rehab          ?  ?  ?  ?  ?  ?  ?  ?  ?  ?  ? ?Social Determinants of Health (SDOH) Interventions ?  ? ? ?Readmission Risk Interventions ?   ? View : No data to display.  ?  ?  ?  ? ? ? ? ? ?

## 2021-05-25 NOTE — Progress Notes (Signed)
Inpatient Rehabilitation Admission Medication Review by a Pharmacist ? ?A complete drug regimen review was completed for this patient to identify any potential clinically significant medication issues. ? ?High Risk Drug Classes Is patient taking? Indication by Medication  ?Antipsychotic No   ?Anticoagulant Yes Lovenox-DVT prophylaxis  ?Antibiotic No   ?Opioid No   ?Antiplatelet Yes Aspirin, clopidogrel-CVA prophylaxis   ?Hypoglycemics/insulin Yes Empagliflozin and SS Novolog  ?Vasoactive Medication No   ?Chemotherapy No   ?Other Yes Atorvastatin-hyperlipidemia ?ezetimibe-hyperlipidemia ?Levothyroxine-hypothyroidism ?Cozaar-HTN ?Cardura-HTN ?Allopurinol-gout ? ?  ? ? ? ?Type of Medication Issue Identified Description of Issue Recommendation(s)  ?Drug Interaction(s) (clinically significant) ?    ?Duplicate Therapy ?    ?Allergy ?    ?No Medication Administration End Date ?    ?Incorrect Dose ?    ?Additional Drug Therapy Needed ?    ?Significant med changes from prior encounter (inform family/care partners about these prior to discharge).    ?Other ?    ? ? ?Clinically significant medication issues were identified that warrant physician communication and completion of prescribed/recommended actions by midnight of the next day:  No ? ?Pharmacist comments: Actos 50 mg daily on hold ? ?Time spent performing this drug regimen review (minutes):  10 ? ? ?Blenda Nicely ?05/25/2021 10:15 PM ?

## 2021-05-25 NOTE — Plan of Care (Signed)
?  Problem: Education: ?Goal: Knowledge of disease or condition will improve ?Outcome: Progressing ?Goal: Knowledge of secondary prevention will improve (SELECT ALL) ?Outcome: Progressing ?Goal: Knowledge of patient specific risk factors will improve (INDIVIDUALIZE FOR PATIENT) ?Outcome: Progressing ?  ?Problem: Coping: ?Goal: Will verbalize positive feelings about self ?Outcome: Progressing ?Goal: Will identify appropriate support needs ?Outcome: Progressing ?  ?Problem: Health Behavior/Discharge Planning: ?Goal: Ability to manage health-related needs will improve ?Outcome: Progressing ?  ?Problem: Self-Care: ?Goal: Verbalization of feelings and concerns over difficulty with self-care will improve ?Outcome: Progressing ?  ?Problem: Nutrition: ?Goal: Risk of aspiration will decrease ?Outcome: Progressing ?  ?Problem: Safety: ?Goal: Ability to remain free from injury will improve ?Outcome: Progressing ?  ?Problem: Skin Integrity: ?Goal: Risk for impaired skin integrity will decrease ?Outcome: Progressing ?  ?

## 2021-05-25 NOTE — Progress Notes (Signed)
Signed    ? ?   ?   ?   ?   ?   ?   ?   ?   ?   ?   ?   ?   ?   ?   ?   ?   ?   ?   ?   ?   ?   ?   ?   ?   ?   ?   ?   ?   ?   ?   ?   ?   ?   ?   ?   ?   ?   ?   ?   ?   ?   ?   ?   ?   ?   ?   ?   ?   ?   ?   ?   ?   ?   ?   ?   ?   ?   ?   ?   ?   ?   ?   ?   ?   ?   ?   ?   ?   ?   ?   ?   ?   ?   ?   ?   ?   ?   ?   ?   ?   ?   ?   ?   ?   ?   ?   ?   ?   ?   ?   ?   ?   ?   ?   ?   ?   ?   ?   ?   ?   ?   ?   ?   ?   ?   ?   ?   ?   ?   ?   ?   ?   ?   ?   ?   ?   ?   ?   ?   ?   ?   ?   ?   ?   ?   ?   ?   ?   ?   ?   ?   ?   ?   ?   ?   ?PMR Admission Coordinator Pre-Admission Assessment ?  ?Patient: Alan Rubio is an 88 y.o., male ?MRN: [id number] ?DOB: April 02, 1931 ?Height: 6' (182.9 cm) ?Weight: 80.8 kg ?  ?Insurance Information ?HMO:     PPO:      PCP:      IPA:      80/20: yes      OTHER:  ?PRIMARY:  Medicare A and B      Policy#: [id number] Subscriber: pt ?CM Name:       Phone#:      Fax#:  ?Pre-Cert#:       Employer:  ?Benefits:  Phone #: verified eligibility via [place] on 05/24/21     Name:  ?Eff. Date: Part A & B effective 04/26/1998 effective    Deduct: $1,600      Out of Pocket Max: NA      Life Max: NA ?CIR: 100% coverage      SNF: 100% for days 1-20, 80% days 21-100 ?Outpatient: 80% coverage     Co-Pay: 20% ?Home Health: 100% coverage      Co-Pay:  ?DME: 80% coverage  Co-Pay: 20% ?Providers: pt's choice ?  ?SECONDARY: AARP      Policy#: [id number]     Phone#:  ?  ?Financial Counselor:       Phone#:  ?  ?The ?Data Collection Information Summary? for patients in Inpatient Rehabilitation Facilities with attached ?Privacy Act [place] Records? was provided and verbally reviewed with: Patient and Family ?  ?Emergency Contact Information ?Contact Information   ?  ?  Name Relation Home Work Mobile  ?  Hanner,James Son     [phone]  ?  Zamarron,BONNIE Spouse [phone]      ?  ?   ?  ?  ?Current Medical History  ?Patient Admitting Diagnosis: CVA ?History of Present Illness: Ercil Butner is  a 88 y.o. male with medical history significant of hypertension, hyperlipidemia, atrial fibrillation, CVA in 2022, hypothyroidism, and DM type II who presented to the ED  05/22/21 due to weakness and confusion.  Patient's stepson is present at bedside and helps provide additional history. Upon admission into the emergency department patient was noted to be afebrile with blood pressures elevated up to 183/77, and all other vital signs stable.  Labs were unremarkable.  Chest x-ray noted no acute abnormality.  MRI of the brain significant for acute perforator infarct of the parasagittal left pons with chronic infarcts and chronic microvascular ischemic changes appreciate. Pt. Admitted for further work up. Pt. Seen by PT/OT who recommended CIR to assist return to PLOF.  ?  ?  ?Complete NIHSS TOTAL: 0 ?  ?Patient's medical record from [facility]  has been reviewed by the rehabilitation admission coordinator and physician. ?  ?Past Medical History  ?    ?Past Medical History:  ?Diagnosis Date  ? A-fib ([place])    ? Cancer [facility])    ? Diabetes mellitus without complication ([place])    ? Encounter for loop recorder check 09/15/2018  ? H/O: stroke 04/14/2018  ? Hyperlipemia 06/13/2018  ? Hypertension    ? Hypothyroidism 06/13/2018  ? 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  Remote loop recorder check  9.15.20: 1 false AF=SB/1st deg AVB/PACs  ? Stroke [facility])    ? Thyroid disease    ?  ?  ?Has the patient had major surgery during 100 days prior to admission? Yes ?  ?Family History   ?family history includes Aneurysm in his father; Cancer in his brother; Stroke in his mother. ?  ?Current Medications ?  ?Current Facility-Administered Medications:  ?  acetaminophen (TYLENOL) tablet 650 mg, 650 mg, Oral, Q4H PRN **OR** acetaminophen (TYLENOL) 160 MG/5ML solution 650 mg, 650 mg, Per Tube, Q4H PRN **OR** acetaminophen (TYLENOL) suppository 650 mg, 650 mg, Rectal, Q4H PRN, Tamala Julian, Rondell A, MD ?   allopurinol (ZYLOPRIM) tablet 300 mg, 300 mg, Oral, Daily, Smith, Rondell A, MD, 300 mg at 05/24/21 1020 ?  aspirin EC tablet 81 mg, 81 mg, Oral, Daily, Rosalin Hawking, MD, 81 mg at 05/24/21 1021 ?  atorvastatin (LIPITOR) tablet 40 mg, 40 mg, Oral, Daily, Eugenie Filler, MD, 40 mg at 05/24/21 1021 ?  [COMPLETED] clopidogrel (PLAVIX) tablet 300 mg, 300 mg, Oral, Once, 300 mg at 05/22/21 1904 **FOLLOWED BY** clopidogrel (PLAVIX) tablet 75 mg, 75 mg, Oral, Daily, Rosalin Hawking, MD, 75 mg at 05/24/21 1021 ?  doxazosin (CARDURA) tablet 4 mg, 4 mg, Oral, Daily, Tamala Julian, Rondell A, MD, 4 mg at 05/24/21 1021 ?  empagliflozin (JARDIANCE) tablet 25 mg, 25 mg, Oral, Daily, Smith, Rondell A,  MD, 25 mg at 05/24/21 1021 ?  enoxaparin (LOVENOX) injection 40 mg, 40 mg, Subcutaneous, Q24H, Smith, Rondell A, MD, 40 mg at 05/23/21 2125 ?  ezetimibe (ZETIA) tablet 10 mg, 10 mg, Oral, Daily, Rosalin Hawking, MD, 10 mg at 05/24/21 1021 ?  hydrALAZINE (APRESOLINE) injection 10 mg, 10 mg, Intravenous, Q4H PRN, Tamala Julian, Rondell A, MD ?  insulin aspart (novoLOG) injection 0-9 Units, 0-9 Units, Subcutaneous, TID WC, Eugenie Filler, MD ?  levothyroxine (SYNTHROID) tablet 88 mcg, 88 mcg, Oral, QAC breakfast, Fuller Plan A, MD, 88 mcg at 05/24/21 1610 ?  losartan (COZAAR) tablet 50 mg, 50 mg, Oral, Daily, Eugenie Filler, MD, 50 mg at 05/24/21 1020 ?  melatonin tablet 1.5 mg, 1.5 mg, Oral, QHS, Smith, Rondell A, MD, 1.5 mg at 05/23/21 2125 ?  sodium bicarbonate tablet 650 mg, 650 mg, Oral, BID, Eugenie Filler, MD, 650 mg at 05/24/21 1020 ?  ?Patients Current Diet:  ?Diet Order   ?  ?         ?    Diet heart healthy/carb modified Room service appropriate? Yes; Fluid consistency: Thin  Diet effective now       ?  ?  ?   ?  ?  ?   ?  ?  ?Precautions / Restrictions ?Precautions ?Precautions: Fall  ?  ?Has the patient had 2 or more falls or a fall with injury in the past year? No ?  ?Prior Activity Level ?Limited Community (1-2x/wk): Mostly went  out for appointments ?  ?Prior Functional Level ?Self Care: Did the patient need help bathing, dressing, using the toilet or eating? Independent ?  ?Indoor Mobility: Did the patient need assistance with walking from room to room (with or without device)? Independent ?  ?Stairs: Did the patient need assistance with internal or external stairs (with or without device)? Independent ?  ?Functional Cognition: Did the patient need help planning regular tasks such as shopping or remembering to take medications? Independent ?  ?Patient Information ?Are you of Hispanic, Latino/a,or Spanish origin?: A. No, not of Hispanic, Latino/a, or Spanish origin ?What is your race?: A. White ?Do you need or want an interpreter to communicate with a doctor or health care staff?: 0. No ?  ?Patient's Response To:  ?Health Literacy and Transportation ?Is the patient able to respond to health literacy and transportation needs?: Yes ?Health Literacy - How often do you need to have someone help you when you read instructions, pamphlets, or other written material from your doctor or pharmacy?: Never ?In the past 12 months, has lack of transportation kept you from medical appointments or from getting medications?: No ?In the past 12 months, has lack of transportation kept you from meetings, work, or from getting things needed for daily living?: No ?  ?Home Assistive Devices / Equipment ?Home Equipment: [name] - single point ?  ?Prior Device Use: Indicate devices/aids used by the patient prior to current illness, exacerbation or injury? None of the above ?  ?Current Functional Level ?Cognition ?  Overall Cognitive Status: Within Functional Limits for tasks assessed ?Orientation Level: Oriented to person, Oriented to place, Oriented to time, Disoriented to situation ?   ?Extremity Assessment ?(includes Sensation/Coordination) ?  Upper Extremity Assessment: Defer to OT evaluation  ?Lower Extremity Assessment: Generalized weakness, RLE  deficits/detail ?RLE Deficits / Details: 4/5 grossly graded ?RLE Sensation: decreased proprioception  ?   ?ADLs ?     ?   ?Mobility ?  Overal bed mobility:  Needs Assistance ?Bed Mobility: Supine to Sit ?Supine to sit:

## 2021-05-25 NOTE — Care Management Important Message (Signed)
Important Message ? ?Patient Details  ?Name: Alan Rubio ?MRN: [id number] ?Date of Birth: [birth year] ? ? ?Medicare Important Message Given:  Yes ? ? ? ? ?Jerret Mcbane ?05/25/2021, 3:56 PM ?

## 2021-05-26 ENCOUNTER — Encounter (HOSPITAL_COMMUNITY): Payer: Self-pay | Admitting: Physical Medicine & Rehabilitation

## 2021-05-26 DIAGNOSIS — I639 Cerebral infarction, unspecified: Secondary | ICD-10-CM | POA: Diagnosis not present

## 2021-05-26 LAB — CBC WITH DIFFERENTIAL/PLATELET
Abs Immature Granulocytes: 0.02 10*3/uL (ref 0.00–0.07)
Basophils Absolute: 0.1 10*3/uL (ref 0.0–0.1)
Basophils Relative: 1 %
Eosinophils Absolute: 0.1 10*3/uL (ref 0.0–0.5)
Eosinophils Relative: 2 %
HCT: 40.3 % (ref 39.0–52.0)
Hemoglobin: 14.2 g/dL (ref 13.0–17.0)
Immature Granulocytes: 0 %
Lymphocytes Relative: 20 %
Lymphs Abs: 1.5 10*3/uL (ref 0.7–4.0)
MCH: 32.3 pg (ref 26.0–34.0)
MCHC: 35.2 g/dL (ref 30.0–36.0)
MCV: 91.6 fL (ref 80.0–100.0)
Monocytes Absolute: 1.1 10*3/uL — ABNORMAL HIGH (ref 0.1–1.0)
Monocytes Relative: 15 %
Neutro Abs: 4.6 10*3/uL (ref 1.7–7.7)
Neutrophils Relative %: 62 %
Platelets: 189 10*3/uL (ref 150–400)
RBC: 4.4 MIL/uL (ref 4.22–5.81)
RDW: 12.9 % (ref 11.5–15.5)
WBC: 7.4 10*3/uL (ref 4.0–10.5)
nRBC: 0 % (ref 0.0–0.2)

## 2021-05-26 LAB — COMPREHENSIVE METABOLIC PANEL
ALT: 15 U/L (ref 0–44)
AST: 25 U/L (ref 15–41)
Albumin: 3.3 g/dL — ABNORMAL LOW (ref 3.5–5.0)
Alkaline Phosphatase: 47 U/L (ref 38–126)
Anion gap: 9 (ref 5–15)
BUN: 15 mg/dL (ref 8–23)
CO2: 23 mmol/L (ref 22–32)
Calcium: 9.4 mg/dL (ref 8.9–10.3)
Chloride: 104 mmol/L (ref 98–111)
Creatinine, Ser: 0.73 mg/dL (ref 0.61–1.24)
GFR, Estimated: >60 mL/min (ref >60)
Glucose, Bld: 120 mg/dL — ABNORMAL HIGH (ref 70–99)
Potassium: 3.7 mmol/L (ref 3.5–5.1)
Sodium: 136 mmol/L (ref 135–145)
Total Bilirubin: 0.7 mg/dL (ref 0.3–1.2)
Total Protein: 6.4 g/dL — ABNORMAL LOW (ref 6.5–8.1)

## 2021-05-26 LAB — GLUCOSE, CAPILLARY
Glucose-Capillary: 108 mg/dL — ABNORMAL HIGH (ref 70–99)
Glucose-Capillary: 142 mg/dL — ABNORMAL HIGH (ref 70–99)
Glucose-Capillary: 178 mg/dL — ABNORMAL HIGH (ref 70–99)
Glucose-Capillary: 178 mg/dL — ABNORMAL HIGH (ref 70–99)

## 2021-05-26 MED ORDER — ENSURE ENLIVE PO LIQD
237.0000 mL | Freq: Two times a day (BID) | ORAL | Status: DC
  Administered 2021-05-26 – 2021-06-02 (×12): 237 mL via ORAL

## 2021-05-26 NOTE — Plan of Care (Signed)
?  Problem: RH Swallowing ?Goal: LTG Patient will consume least restrictive diet using compensatory strategies with assistance (SLP) ?Description: LTG:  Patient will consume least restrictive diet using compensatory strategies with assistance (SLP) ?Flowsheets (Taken 05/26/2021 1709) ?LTG: Pt Patient will consume least restrictive diet using compensatory strategies with assistance of (SLP): Modified Independent ?Goal: LTG Pt will demonstrate functional change in swallow as evidenced by bedside/clinical objective assessment (SLP) ?Description: LTG: Patient will demonstrate functional change in swallow as evidenced by bedside/clinical objective assessment (SLP) ?Flowsheets (Taken 05/26/2021 1709) ?LTG: Patient will demonstrate functional change in swallow as evidenced by bedside/clinical objective assessment: Oral swallow ?  ?Problem: RH Cognition - SLP ?Goal: RH LTG Patient will demonstrate orientation with cues ?Description:  LTG:  Patient will demonstrate orientation to person/place/time/situation with cues (SLP)   ?Flowsheets (Taken 05/26/2021 1709) ?LTG Patient will demonstrate orientation to: ? Person ? Place ? Time ? Situation ?LTG: Patient will demonstrate orientation using cueing (SLP): Minimal Assistance - Patient > 75% ?  ?Problem: RH Expression Communication ?Goal: LTG Patient will increase speech intelligibility (SLP) ?Description: LTG: Patient will increase speech intelligibility at word/phrase/conversation level with cues, % of the time (SLP) ?Flowsheets (Taken 05/26/2021 1709) ?LTG: Patient will increase speech intelligibility (SLP): Minimal Assistance - Patient > 75% ?Level: Phrase ?  ?Problem: RH Problem Solving ?Goal: LTG Patient will demonstrate problem solving for (SLP) ?Description: LTG:  Patient will demonstrate problem solving for basic/complex daily situations with cues  (SLP) ?Flowsheets (Taken 05/26/2021 1709) ?LTG: Patient will demonstrate problem solving for (SLP): Basic daily situations ?LTG Patient  will demonstrate problem solving for: Minimal Assistance - Patient > 75% ?  ?Problem: RH Memory ?Goal: LTG Patient will use memory compensatory aids to (SLP) ?Description: LTG:  Patient will use memory compensatory aids to recall biographical/new, daily complex information with cues (SLP) ?Flowsheets (Taken 05/26/2021 1709) ?LTG: Patient will use memory compensatory aids to (SLP): Minimal Assistance - Patient > 75% ?  ?

## 2021-05-26 NOTE — Progress Notes (Signed)
?                                                       PROGRESS NOTE ? ? ?Subjective/Complaints: ? ?Eating 25-75% meals , per wife did not eat well at home but drank ensure  ?Used cane to go to MD offices but generally not using AD for amb  ?Labs reviewed essentially nl  ?ROS- neg CP, SOB, N/V/D ? ?Objective: ?  ?No results found. ?Recent Labs  ?  05/25/21 ?1613 05/26/21 ?7564  ?WBC 6.1 7.4  ?HGB 13.8 14.2  ?HCT 40.5 40.3  ?PLT 209 189  ? ?Recent Labs  ?  05/25/21 ?0102 05/25/21 ?1613 05/26/21 ?3329  ?NA 134*  --  136  ?K 3.7  --  3.7  ?CL 104  --  104  ?CO2 21*  --  23  ?GLUCOSE 156*  --  120*  ?BUN 16  --  15  ?CREATININE 0.77 0.84 0.73  ?CALCIUM 9.1  --  9.4  ? ? ?Intake/Output Summary (Last 24 hours) at 05/26/2021 0831 ?Last data filed at 05/26/2021 0700 ?Gross per 24 hour  ?Intake 120 ml  ?Output 700 ml  ?Net -580 ml  ?  ? ?  ? ?Physical Exam: ?Vital Signs ?Blood pressure 139/70, pulse 74, temperature 98 ?F (36.7 ?C), temperature source Oral, resp. rate 14, height 6' (1.829 m), weight 75.4 kg, SpO2 100 %. ? ? ?General: No acute distress ?Mood and affect are appropriate ?Heart: Regular rate and rhythm no rubs murmurs or extra sounds ?Lungs: Clear to auscultation, breathing unlabored, no rales or wheezes ?Abdomen: Positive bowel sounds, soft nontender to palpation, nondistended ?Extremities: No clubbing, cyanosis, or edema ?Skin: No evidence of breakdown, no evidence of rash ?Neurologic: Cranial nerves II through XII intact, motor strength is 4/5 in bilateral deltoid, bicep, tricep, grip, Left hip flexor, knee extensors, ankle dorsiflexor and plantar flexor ?3- RLE  ?Sensory exam normal sensation to light touch  in bilateral upper and lower extremities ? ?Musculoskeletal: Full range of motion in all 4 extremities. No joint swelling ? ? ?Assessment/Plan: ?1. Functional deficits which require 3+ hours per day of interdisciplinary therapy in a comprehensive inpatient rehab setting. ?Physiatrist is providing close team  supervision and 24 hour management of active medical problems listed below. ?Physiatrist and rehab team continue to assess barriers to discharge/monitor patient progress toward functional and medical goals ? ?Care Tool: ? ?Bathing ?   ?   ?   ?  ?  ?Bathing assist   ?  ?  ?Upper Body Dressing/Undressing ?Upper body dressing   ?  ?   ?Upper body assist   ?   ?Lower Body Dressing/Undressing ?Lower body dressing ? ? ?   ?  ? ?  ? ?Lower body assist   ?   ? ?Toileting ?Toileting    ?Toileting assist Assist for toileting: Minimal Assistance - Patient > 75% ?  ?  ?Transfers ?Chair/bed transfer ? ?Transfers assist ?   ? ?Chair/bed transfer assist level: Minimal Assistance - Patient > 75% ?  ?  ?Locomotion ?Ambulation ? ? ?Ambulation assist ? ?   ? ?  ?  ?   ? ?Walk 10 feet activity ? ? ?Assist ?   ? ?  ?   ? ?Walk 50 feet activity ? ? ?Assist   ? ?  ?   ? ? ?  Walk 150 feet activity ? ? ?Assist   ? ?  ?  ?  ? ?Walk 10 feet on uneven surface  ?activity ? ? ?Assist   ? ? ?  ?   ? ?Wheelchair ? ? ? ? ?Assist   ?  ?  ? ?  ?   ? ? ?Wheelchair 50 feet with 2 turns activity ? ? ? ?Assist ? ?  ?  ? ? ?   ? ?Wheelchair 150 feet activity  ? ? ? ?Assist ?   ? ? ?   ? ?Blood pressure 139/70, pulse 74, temperature 98 ?F (36.7 ?C), temperature source Oral, resp. rate 14, height 6' (1.829 m), weight 75.4 kg, SpO2 100 %. ? ?1. 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            -patient may shower ?            -ELOS/Goals: 7-10 days ?            -Admit to CIR PT, OT, SLP evals, team conf in am discussed with wife  ?2.  Antithrombotics: ?-DVT/anticoagulation:  Pharmaceutical: Lovenox ?            -antiplatelet therapy: Aspirin 81 mg daily and Plavix 75 mg daily x3 weeks then Plavix alone ?3. Pain Management: Tylenol as needed ?4.  Mood: Melatonin 1.5 mg nightly provide emotional support ?            -antipsychotic agents: N/A ?5. Neuropsych: This patient is capable of making decisions on his own behalf. ?6. Skin/Wound Care: Routine skin checks ?7. Fluids/Electrolytes/Nutrition: Routine in and outs with follow-up chemistries ?8.  Type 2 diabetes mellitus.  Hemoglobin A1c 7.7.  Jardiance 25 mg daily as well as SSI.  Actos 50 mg daily on hold resume as  needed ?CBG (last 3)  ?Recent Labs  ?  05/25/21 ?1605 05/25/21 ?2101 05/26/21 ?0612  ?GLUCAP 184* 148* 108*  ?COnt current regimen , may need to increase if appetite increases , add ensure  ? ?9.  Hypertension.  Well controlled, continue Cozaar 50 mg daily, Cardura 4 mg daily ?Vitals:  ? 05/25/21 2006 05/26/21 0537  ?BP: (!) 166/71 139/70  ?Pulse: 69 74  ?Resp: 14 14  ?Temp: 98 ?F (36.7 ?C) 98 ?F (36.7 ?C)  ?SpO2: 99% 100%  ?Some lability cont to monitor  ? ?10.  Hyperlipidemia.  Continue Lipitor 40 mg daily, Zetia 10 mg daily ?11.  History of gout.  Continue Zyloprim 300 mg daily.  Monitor for any gout flareups ?12.  Hypothyroidism.  Continue Synthroid ?  ? ?LOS: ?1 days ?A FACE TO FACE EVALUATION WAS PERFORMED ? ?Luanna Salk Jaequan Propes ?05/26/2021, 8:31 AM  ? ? ? ?

## 2021-05-26 NOTE — Plan of Care (Signed)
?  Problem: RH Bathing ?Goal: LTG Patient will bathe all body parts with assist levels (OT) ?Description: LTG: Patient will bathe all body parts with assist levels (OT) ?Flowsheets (Taken 05/26/2021 1354) ?LTG: Pt will perform bathing with assistance level/cueing: Contact Guard/Touching assist ?LTG: Position pt will perform bathing: Shower ?  ?Problem: RH Dressing ?Goal: LTG Patient will perform upper body dressing (OT) ?Description: LTG Patient will perform upper body dressing with assist, with/without cues (OT). ?Flowsheets (Taken 05/26/2021 1354) ?LTG: Pt will perform upper body dressing with assistance level of: Supervision/Verbal cueing ?Goal: LTG Patient will perform lower body dressing w/assist (OT) ?Description: LTG: Patient will perform lower body dressing with assist, with/without cues in positioning using equipment (OT) ?Flowsheets (Taken 05/26/2021 1354) ?LTG: Pt will perform lower body dressing with assistance level of: Contact Guard/Touching assist ?  ?Problem: RH Toileting ?Goal: LTG Patient will perform toileting task (3/3 steps) with assistance level (OT) ?Description: LTG: Patient will perform toileting task (3/3 steps) with assistance level (OT)  ?Flowsheets (Taken 05/26/2021 1354) ?LTG: Pt will perform toileting task (3/3 steps) with assistance level: Supervision/Verbal cueing ?  ?Problem: RH Toilet Transfers ?Goal: LTG Patient will perform toilet transfers w/assist (OT) ?Description: LTG: Patient will perform toilet transfers with assist, with/without cues using equipment (OT) ?Flowsheets (Taken 05/26/2021 1354) ?LTG: Pt will perform toilet transfers with assistance level of: Contact Guard/Touching assist ?  ?Problem: RH Tub/Shower Transfers ?Goal: LTG Patient will perform tub/shower transfers w/assist (OT) ?Description: LTG: Patient will perform tub/shower transfers with assist, with/without cues using equipment (OT) ?Flowsheets (Taken 05/26/2021 1354) ?LTG: Pt will perform tub/shower stall transfers with  assistance level of: Contact Guard/Touching assist ?  ?

## 2021-05-26 NOTE — Evaluation (Signed)
Physical Therapy Assessment and Plan ? ?Patient Details  ?Name: Alan Rubio ?MRN: [id number] ?Date of Birth: 1931-09-06 ? ?PT Diagnosis: Abnormal posture, Abnormality of gait, Cognitive deficits, Difficulty walking, and Muscle weakness ?Rehab Potential: Good ?ELOS: ~2.5 - 3 weeks  ? ?Today's Date: 05/26/2021 ?PT Individual Time: 6237-6283 ?PT Individual Time Calculation (min): 64 min   ? ?Hospital Problem: Principal Problem: ?  Left pontine cerebrovascular accident [facility]) ? ? ?Past Medical History:  ?Past Medical History:  ?Diagnosis Date  ? A-fib ([place])   ? Cancer [facility])   ? Diabetes mellitus without complication ([place])   ? Encounter for loop recorder check 09/15/2018  ? H/O: stroke 04/14/2018  ? Hyperlipemia 06/13/2018  ? Hypertension   ? Hypothyroidism 06/13/2018  ? 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 Remote loop recorder check  9.15.20: 1 false AF=SB/1st deg AVB/PACs  ? Stroke [facility])   ? Thyroid disease   ? ?Past Surgical History:  ?Past Surgical History:  ?Procedure Laterality Date  ? BACK SURGERY    ? LOOP RECORDER IMPLANT    ? ? ?Assessment & Plan ?Clinical Impression: Patient is a 88 y.o.  right-handed male with history of hypertension, hyperlipidemia, atrial fibrillation, CVA 2020 status post loop recorder maintained on low-dose aspirin, hypothyroidism, type 2 diabetes mellitus.  Per chart review patient lives with spouse.  Modified independent with occasional use of a cane.  Two-level home bed and bath upstairs patient does have a chair lift on the stairs.  Presented 05/22/2021 with acute onset of slurred speech and bilateral lower extremity weakness.  EMS arrived blood pressure 183/77.  Cranial CT scan showed no acute intracranial abnormality.  Sequela of prior hemorrhage in the right frontal lobe and chronic microvascular ischemic disease.  Patient did not receive tPA.  Chest x-ray negative.  MRI of the brain showed acute perforator infarct of the parasagittal left pons.  Chronic infarcts  and chronic microvascular ischemic changes.  MRA showed no proximal intracranial vessel occlusion.  Carotid Dopplers with no ICA stenosis.  Admission chemistries unremarkable except glucose 151, hemoglobin A1c 7.7.  Echocardiogram with ejection fraction of 60 to 65% no wall motion abnormalities grade 1 diastolic dysfunction.  Neurology follow-up maintained on aspirin 81 mg daily and Plavix 75 mg daily for CVA prophylaxis x3 weeks then Plavix alone.  Interrogation of his loop recorder revealed no atrial fibrillation.  Subcutaneous Lovenox for DVT prophylaxis.  Tolerating a regular consistency diet.  Therapy evaluations completed due to patient decreased functional mobility lower extremity weakness and dysarthric speech was admitted for a comprehensive rehab program. As per children, patient lives with wife who has some dementia. Patient transferred to CIR on 05/25/2021 .  ? ?Patient currently requires mod assist with mobility secondary to muscle weakness, decreased cardiorespiratoy endurance, impaired timing and sequencing, unbalanced muscle activation, and decreased motor planning, decreased initiation, decreased attention, decreased awareness, decreased problem solving, decreased safety awareness, decreased memory, and delayed processing, and decreased standing balance, decreased postural control, and decreased balance strategies.  Prior to hospitalization, patient was modified independent  with mobility and lived with Spouse (Of note, spouse with dementia per her report) in a House home.  Home access is 2Stairs to enter, Other (comment). ? ?Patient will benefit from skilled PT intervention to maximize safe functional mobility, minimize fall risk, and decrease caregiver burden for planned discharge home with 24 hour supervision.  Anticipate patient will benefit from follow up [place] at discharge. ? ?PT - End of Session ?Activity Tolerance: Tolerates  30+ min activity with multiple rests ?Endurance Deficit: Yes ?Endurance  Deficit Description: significantly limited standing and gait endurance requiring frequent seated rest breaks - also drop in BP in standing ?PT Assessment ?Rehab Potential (ACUTE/IP ONLY): Good ?PT Barriers to Discharge: Decreased caregiver support;Home environment access/layout;Lack of/limited family support ?PT Patient demonstrates impairments in the following area(s): Balance;Nutrition;Skin Integrity;Pain;Behavior;Edema;Perception;Endurance;Safety;Motor;Sensory ?PT Transfers Functional Problem(s): Bed Mobility;Bed to Chair;Car;Furniture ?PT Locomotion Functional Problem(s): Ambulation;Stairs;Wheelchair Mobility ?PT Plan ?PT Intensity: Minimum of 1-2 x/day ,45 to 90 minutes ?PT Frequency: 5 out of 7 days ?PT Duration Estimated Length of Stay: ~2.5 - 3 weeks ?PT Treatment/Interventions: Ambulation/gait training;Cognitive remediation/compensation;Discharge planning;DME/adaptive equipment instruction;Functional mobility training;Pain management;Psychosocial support;Splinting/orthotics;Therapeutic Activities;UE/LE Strength taining/ROM;Visual/perceptual remediation/compensation;Balance/vestibular training;Community reintegration;Disease management/prevention;Functional electrical stimulation;Neuromuscular re-education;Patient/family education;Stair training;Skin care/wound management;Therapeutic Exercise;UE/LE Coordination activities;Wheelchair propulsion/positioning ?PT Transfers Anticipated Outcome(s): supervision using LRAD ?PT Locomotion Anticipated Outcome(s): supervision using LRAD ?PT Recommendation ?Follow Up Recommendations: Home health PT;24 hour supervision/assistance ?Patient destination: Home ?Equipment Recommended: To be determined;Rolling walker with 5" wheels ? ? ?PT Evaluation ?Precautions/Restrictions ?Precautions ?Precautions: Fall;Other (comment) ?Precaution Comments: HOH, orthostatic ?Restrictions ?Weight Bearing Restrictions: No ?Pain ?Pain Assessment ?Pain Scale: 0-10 ?Pain Score: 0-No pain ?Pain  Interference ?Pain Interference ?Pain Effect on Sleep: 0. Does not apply - I have not had any pain or hurting in the past 5 days ?Pain Interference with Therapy Activities: 1. Rarely or not at all ?Pain Interference with Day-to-Day Activities: 1. Rarely or not at all ?Home Living/Prior Functioning ?Home Living ?Available Help at Discharge: Family;Available PRN/intermittently;Available 24 hours/day (wife will be there 24/7 to provide only supervision primarily and pt's 2 sons Quita Skye and Lucianne Lei) could possibly provide 24hr support for the first 1-2 weeks but if pt needed 24hr assistance after that they would have to hire support b/c sons can only be PRN) ?Type of Home: House ?Home Access: Stairs to enter;Other (comment) (from garage) ?Entrance Stairs-Number of Steps: 2 ?Entrance Stairs-Rails: Left ?Home Layout: Two level;Bed/bath upstairs;1/2 bath on main level ?Alternate Level Stairs-Number of Steps: Pt has a chair lift on flight of stairs that he ALWAYS used even prior ?Bathroom Shower/Tub: Walk-in shower ? Lives With: Spouse (wife, Horris Latino) ?Prior Function ?Level of Independence: Other (comment);Independent with gait;Independent with transfers (pt would furniture walk in the house (relies heavily on walls and furniture for support) and use a cane when out of the house) ? Able to Take Stairs?: No ?Driving: No ?Vocation: Retired ?Vision/Perception  ?Vision - History ?Ability to See in Adequate Light: 0 Adequate ?Vision - Assessment ?Additional Comments: poor L upper diagonal visual scanning, smooth pursuit ?Perception ?Perception: Within Functional Limits ?Praxis ?Praxis: Impaired ?Praxis Impairment Details: Motor planning (mild impairments in motor planning during functional trasnfers needing cues and manual facilitation) ?Cognition  ?Overall Cognitive Status: Impaired/Different from baseline ?Arousal/Alertness: Awake/alert ?Orientation Level: Oriented to person;Oriented to place;Disoriented to time;Disoriented to  situation (pt remembers his legs "getting weak" but doesn't recall having a stroke; states it is April 4th or 5th) ?Year: 2023 ?Month: April ?Day of Week: Correct ?Attention: Focused;Sustained ?Focused Att

## 2021-05-26 NOTE — Progress Notes (Signed)
Inpatient Rehabilitation Center ?Individual Statement of Services ? ?Patient Name:  Alan Rubio  ?Date:  05/26/2021 ? ?Welcome to the [facility].  Our goal is to provide you with an individualized program based on your diagnosis and situation, designed to meet your specific needs.  With this comprehensive rehabilitation program, you will be expected to participate in at least 3 hours of rehabilitation therapies Monday-Friday, with modified therapy programming on the weekends. ? ?Your rehabilitation program will include the following services:  Physical Therapy (PT), Occupational Therapy (OT), Speech Therapy (ST), 24 hour per day rehabilitation nursing, Therapeutic Recreaction (TR), Neuropsychology, Care Coordinator, Rehabilitation Medicine, Nutrition Services, and Pharmacy Services ? ?Weekly team conferences will be held on Wednesdays to discuss your progress.  Your Inpatient Rehabilitation Care Coordinator will talk with you frequently to get your input and to update you on team discussions.  Team conferences with you and your family in attendance may also be held. ? ?Expected length of stay:  14-16 Days  Overall anticipated outcome: Min A ? ?Depending on your progress and recovery, your program may change. Your Inpatient Rehabilitation Care Coordinator will coordinate services and will keep you informed of any changes. Your Inpatient Rehabilitation Care Coordinator's name and contact numbers are listed  below. ? ?The following services may also be recommended but are not provided by the [facility]:  ? ?Home Health Rehabiltiation Services ?Outpatient Rehabilitation Services ? ?  ?Arrangements will be made to provide these services after discharge if needed.  Arrangements include referral to agencies that provide these services. ? ?Your insurance has been verified to be:  Medicare A & B ?Your primary doctor is:  [name], MD ? ?Pertinent information will be shared with  your doctor and your insurance company. ? ?Inpatient Rehabilitation Care Coordinator:  Erlene Quan, [place] or ([phone] ? ?Information discussed with and copy given to patient by: Dyanne Iha, 05/26/2021, 11:00 AM    ?

## 2021-05-26 NOTE — Progress Notes (Signed)
Inpatient Rehabilitation  Patient information reviewed and entered into eRehab system by Thedora Rings Meredyth Hornung, OTR/L.   Information including medical coding, functional ability and quality indicators will be reviewed and updated through discharge.    

## 2021-05-26 NOTE — Evaluation (Signed)
Occupational Therapy Assessment and Plan ? ?Patient Details  ?Name: Alan Rubio ?MRN: [id number] ?Date of Birth: [birth year] ? ?OT Diagnosis: abnormal posture, cognitive deficits, and muscle weakness (generalized) ?Rehab Potential: Rehab Potential (ACUTE ONLY): Good ?ELOS: 2.5-3 weeks  ? ?Today's Date: 05/26/2021 ?OT Individual Time: 8413-2440 ?OT Individual Time Calculation (min): 85 min    ? ?Hospital Problem: Principal Problem: ?  Left pontine cerebrovascular accident [facility]) ? ? ?Past Medical History:  ?Past Medical History:  ?Diagnosis Date  ? A-fib ([place])   ? Cancer [facility])   ? Diabetes mellitus without complication ([place])   ? Encounter for loop recorder check 09/15/2018  ? H/O: stroke 04/14/2018  ? Hyperlipemia 06/13/2018  ? Hypertension   ? Hypothyroidism 06/13/2018  ? 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 Remote loop recorder check  9.15.20: 1 false AF=SB/1st deg AVB/PACs  ? Stroke [facility])   ? Thyroid disease   ? ?Past Surgical History:  ?Past Surgical History:  ?Procedure Laterality Date  ? BACK SURGERY    ? LOOP RECORDER IMPLANT    ? ? ?Assessment & Plan ?Clinical Impression: Alan Rubio is a 88 year old right-handed male with history of hypertension, hyperlipidemia, atrial fibrillation, CVA 2020 status post loop recorder maintained on low-dose aspirin, hypothyroidism, type 2 diabetes mellitus.  Per chart review patient lives with spouse.  Modified independent with occasional use of a cane.  Two-level home bed and bath upstairs patient does have a chair lift on the stairs.  Presented 05/22/2021 with acute onset of slurred speech and bilateral lower extremity weakness.  EMS arrived blood pressure 183/77.  Cranial CT scan showed no acute intracranial abnormality.  Sequela of prior hemorrhage in the right frontal lobe and chronic microvascular ischemic disease.  Patient did not receive tPA.  Chest x-ray negative.  MRI of the brain showed acute perforator infarct of the parasagittal left pons.   Chronic infarcts and chronic microvascular ischemic changes.  MRA showed no proximal intracranial vessel occlusion.  Carotid Dopplers with no ICA stenosis.  Admission chemistries unremarkable except glucose 151, hemoglobin A1c 7.7.  Echocardiogram with ejection fraction of 60 to 65% no wall motion abnormalities grade 1 diastolic dysfunction.  Neurology follow-up maintained on aspirin 81 mg daily and Plavix 75 mg daily for CVA prophylaxis x3 weeks then Plavix alone.  Interrogation of his loop recorder revealed no atrial fibrillation.  Subcutaneous Lovenox for DVT prophylaxis.  Tolerating a regular consistency diet.  Therapy evaluations completed due to patient decreased functional mobility lower extremity weakness and dysarthric speech was admitted for a comprehensive rehab program. As per children, patient lives with wife who has some dementia. Patient transferred to CIR on 05/25/2021 .   ? ?Patient currently requires mod with basic self-care skills secondary to muscle weakness, decreased cardiorespiratoy endurance, decreased coordination and decreased motor planning, decreased initiation, decreased attention, decreased awareness, decreased problem solving, decreased safety awareness, decreased memory, and delayed processing, and decreased sitting balance, decreased standing balance, decreased postural control, and decreased balance strategies.  Prior to hospitalization, patient could complete ADLs with supervision. ? ?Patient will benefit from skilled intervention to decrease level of assist with basic self-care skills prior to discharge home with care partner.  Anticipate patient will require 24 hour supervision and follow up home health. ? ?OT - End of Session ?Activity Tolerance: Tolerates 30+ min activity with multiple rests ?Endurance Deficit: Yes ?Endurance Deficit Description: significantly limited standing endurance needing seated rest breaks after <30 seconds. ?OT Assessment ?Rehab Potential (ACUTE ONLY):  Good ?  OT Barriers to Discharge: Decreased caregiver support ?OT Barriers to Discharge Comments: patients wife self reports that she has dementia and memory deficits; son available for first two weeks for 24/7 but then only available intermittently ?OT Patient demonstrates impairments in the following area(s): Balance;Perception;Behavior;Safety;Cognition;Skin Integrity;Endurance;Motor;Nutrition ?OT Basic ADL's Functional Problem(s): Grooming;Bathing;Dressing;Toileting ?OT Transfers Functional Problem(s): Toilet;Tub/Shower ?OT Additional Impairment(s): None ?OT Plan ?OT Intensity: Minimum of 1-2 x/day, 45 to 90 minutes ?OT Frequency: 5 out of 7 days ?OT Duration/Estimated Length of Stay: 2.5-3 weeks ?OT Treatment/Interventions: Balance/vestibular training;Discharge planning;Functional electrical stimulation;Pain management;Self Care/advanced ADL retraining;Therapeutic Activities;UE/LE Coordination activities;Visual/perceptual remediation/compensation;Therapeutic Exercise;Skin care/wound managment;Patient/family education;Functional mobility training;Disease mangement/prevention;Cognitive remediation/compensation;Community reintegration;DME/adaptive equipment instruction;Neuromuscular re-education;Psychosocial support;Splinting/orthotics;UE/LE Strength taining/ROM;Wheelchair propulsion/positioning ?OT Basic Self-Care Anticipated Outcome(s): CGA ?OT Toileting Anticipated Outcome(s): CGA ?OT Bathroom Transfers Anticipated Outcome(s): CGA ?OT Recommendation ?Patient destination: Home ?Follow Up Recommendations: Home health OT;24 hour supervision/assistance ?Equipment Recommended: To be determined ? ? ?OT Evaluation ?Precautions/Restrictions  ?Precautions ?Precautions: Fall ?Restrictions ?Weight Bearing Restrictions: No ?General ?Chart Reviewed: Yes ?Pain ?Pain Assessment ?Pain Scale: 0-10 ?Pain Score: 0-No pain ?Home Living/Prior Functioning ?Home Living ?Family/patient expects to be discharged to:: Private  residence ?Living Arrangements: Spouse/significant other ?Available Help at Discharge: Family, Available PRN/intermittently, Available 24 hours/day (pts son lives 15 minutes away and will be able to assist 24/7 for first two weeks) ?Type of Home: House ?Home Access: Stairs to enter, Other (comment) ?Entrance Stairs-Number of Steps: 2 ?Entrance Stairs-Rails: Left ?Home Layout: Two level, Bed/bath upstairs, 1/2 bath on main level ?Alternate Level Stairs-Number of Steps: Pt has a chair lift on flight of stairs that he ALWAYS used even prior ?Bathroom Shower/Tub: Walk-in shower ?Bathroom Toilet: Handicapped height ?Bathroom Accessibility: Yes ? Lives With: Spouse (wife who has dementia per her report) ?Prior Function ?Level of Independence: Other (comment), Independent with gait, Independent with transfers, Independent with basic ADLs, Needs assistance with homemaking ? Able to Take Stairs?: No ?Driving: No ?Vocation: Retired ?Leisure: Hobbies-yes (Comment) (reading, watching tv, used to enjoy gardening) ?Vision ?Baseline Vision/History: 1 Wears glasses ?Ability to See in Adequate Light: 0 Adequate ?Patient Visual Report: No change from baseline ?Vision Assessment?: No apparent visual deficits ?Additional Comments: poor L upper diagonal visual scanning, smooth pursuit ?Perception  ?Perception: Within Functional Limits ?Praxis ?Praxis: Impaired ?Praxis Impairment Details: Motor planning (mild impairments in motor planning during functional trasnfers needing cues and manual facilitation) ?Cognition ?Cognition ?Overall Cognitive Status: Impaired/Different from baseline ?Arousal/Alertness: Awake/alert ?Orientation Level: Person;Place;Situation ?Person: Oriented ?Place: Oriented ?Situation: Oriented ?Memory: Impaired ?Memory Impairment: Retrieval deficit;Storage deficit ?Attention: Focused;Sustained ?Focused Attention: Appears intact ?Sustained Attention: Appears intact ?Awareness: Impaired ?Awareness Impairment: Emergent  impairment ?Problem Solving: Impaired ?Problem Solving Impairment: Functional basic ?Executive Function: Environmental health practitioner;Initiating;Sequencing ?Sequencing: Appears intact ?Decision Making: Appears intact ?Initiating: Impaire

## 2021-05-26 NOTE — IPOC Note (Addendum)
Overall Plan of Care (IPOC) ?Patient Details ?Name: Alan Rubio ?MRN: [id number] ?DOB: Jun 01, 1931 ? ?Admitting Diagnosis: Left pontine cerebrovascular accident [facility]) ? ?Hospital Problems: Principal Problem: ?  Left pontine cerebrovascular accident [facility]) ? ? ? ? Functional Problem List: ?Nursing Bladder, Bowel, Endurance, Pain, Safety, Medication Management  ?PT Balance, Nutrition, Skin Integrity, Pain, Behavior, Edema, Perception, Endurance, Safety, Motor, Sensory  ?OT Balance, Perception, Behavior, Safety, Cognition, Skin Integrity, Endurance, Motor, Nutrition  ?SLP Cognition, Nutrition, Motor, Safety, Linguistic  ?TR    ?    ? Basic ADL?s: ?OT Grooming, Bathing, Dressing, Toileting  ? ?  Advanced  ADL?s: ?OT    ?   ?Transfers: ?PT Bed Mobility, Bed to Chair, Car, Furniture  ?OT Toilet, Tub/Shower  ? ?  Locomotion: ?PT Ambulation, Stairs, Wheelchair Mobility  ? ?  Additional Impairments: ?OT None  ?SLP Swallowing, Communication, Social Cognition ?expression ?Problem Solving, Memory, Attention, Awareness  ?TR    ? ? ?Anticipated Outcomes ?Item Anticipated Outcome  ?Self Feeding    ?Swallowing ? sup A ?  ?Basic self-care ? CGA  ?Toileting ? CGA ?  ?Bathroom Transfers CGA  ?Bowel/Bladder ? manage bowel w mod I and bladder with toileting  ?Transfers ? supervision using LRAD  ?Locomotion ? supervision using LRAD  ?Communication ? min A  ?Cognition ? min A  ?Pain ? pain at or below level 4 with prns  ?Safety/Judgment ? maintain safety w cues  ? ?Therapy Plan: ?PT Intensity: Minimum of 1-2 x/day ,45 to 90 minutes ?PT Frequency: 5 out of 7 days ?PT Duration Estimated Length of Stay: ~2.5 - 3 weeks ?OT Intensity: Minimum of 1-2 x/day, 45 to 90 minutes ?OT Frequency: 5 out of 7 days ?OT Duration/Estimated Length of Stay: 2.5-3 weeks ?SLP Intensity: Minumum of 1-2 x/day, 30 to 90 minutes ?SLP Frequency: 3 to 5 out of 7 days ?SLP Duration/Estimated Length of Stay: 2.5-3 weeks  ? ?Due to the current state of emergency, patients may  not be receiving their 3-hours of Medicare-mandated therapy. ? ? Team Interventions: ?Nursing Interventions Disease Management/Prevention, Bladder Management, Medication Management, Discharge Planning, Pain Management, Bowel Management, Patient/Family Education  ?PT interventions Ambulation/gait training, Cognitive remediation/compensation, Discharge planning, DME/adaptive equipment instruction, Functional mobility training, Pain management, Psychosocial support, Splinting/orthotics, Therapeutic Activities, UE/LE Strength taining/ROM, Visual/perceptual remediation/compensation, Training and development officer, Community reintegration, Disease management/prevention, Functional electrical stimulation, Neuromuscular re-education, Patient/family education, Stair training, Skin care/wound management, Therapeutic Exercise, UE/LE Coordination activities, Wheelchair propulsion/positioning  ?OT Interventions Balance/vestibular training, Discharge planning, Functional electrical stimulation, Pain management, Self Care/advanced ADL retraining, Therapeutic Activities, UE/LE Coordination activities, Visual/perceptual remediation/compensation, Therapeutic Exercise, Skin care/wound managment, Patient/family education, Functional mobility training, Disease mangement/prevention, Cognitive remediation/compensation, Community reintegration, DME/adaptive equipment instruction, Neuromuscular re-education, Psychosocial support, Splinting/orthotics, UE/LE Strength taining/ROM, Wheelchair propulsion/positioning  ?SLP Interventions Cognitive remediation/compensation, Environmental controls, Internal/external aids, Speech/Language facilitation, Cueing hierarchy, Dysphagia/aspiration precaution training, Functional tasks, Therapeutic Activities, Patient/family education  ?TR Interventions    ?SW/CM Interventions Discharge Planning, Psychosocial Support, Patient/Family Education, Disease Management/Prevention  ? ?Barriers to Discharge ?MD  Medical  stability and Prior CVA  ?Nursing Decreased caregiver support, Home environment access/layout ?2 level 2 ste right rail; B+B upstairs; accessed via stair lift w spouse; step son can assist  ?PT Decreased caregiver support, Home environment access/layout, Lack of/limited family support ?   ?OT Decreased caregiver support ?patients wife self reports that she has dementia and memory deficits; son available for first two weeks for 24/7 but then only available intermittently  ?SLP   ?   ?SW Decreased caregiver  support ?   ? ?Team Discharge Planning: ?Destination: PT-Home ,OT- Home , SLP-Home ?Projected Follow-up: PT-Home health PT, 24 hour supervision/assistance, OT-  Home health OT, 24 hour supervision/assistance, SLP-24 hour supervision/assistance, Home Health SLP ?Projected Equipment Needs: PT-To be determined, Rolling walker with 5" wheels, OT- To be determined, SLP-None recommended by SLP ?Equipment Details: PT- , OT-  ?Patient/family involved in discharge planning: PT- Patient, Family member/caregiver,  OT-Patient, Family member/caregiver, SLP-Patient, Family member/caregiver ? ?MD ELOS: 10-14d  ?Medical Rehab Prognosis:  Good ?Assessment: The patient has been admitted for CIR therapies with the diagnosis of CVA. The team will be addressing functional mobility, strength, stamina, balance, safety, adaptive techniques and equipment, self-care, bowel and bladder mgt, patient and caregiver education, BP management . Goals have been set at supervision. Anticipated discharge destination is Home . ? ? ?   ? ? ?See Team Conference Notes for weekly updates to the plan of care  ?

## 2021-05-26 NOTE — Evaluation (Signed)
Speech Language Pathology Assessment and Plan ? ?Patient Details  ?Name: Alan Rubio ?MRN: [id number] ?Date of Birth: Aug 23, 1931 ? ?SLP Diagnosis: Dysarthria;Dysphagia;Cognitive Impairments;Speech and Language deficits  ?Rehab Potential: Good ?ELOS: 2.5-3 weeks  ? ?Today's Date: 05/26/2021 ?SLP Individual Time: 1300-1400 ?SLP Individual Time Calculation (min): 60 min ? ?Hospital Problem: Principal Problem: ?  Left pontine cerebrovascular accident [facility]) ? ?Past Medical History:  ?Past Medical History:  ?Diagnosis Date  ? A-fib ([place])   ? Cancer [facility])   ? Diabetes mellitus without complication ([place])   ? Encounter for loop recorder check 09/15/2018  ? H/O: stroke 04/14/2018  ? Hyperlipemia 06/13/2018  ? Hypertension   ? Hypothyroidism 06/13/2018  ? 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 Remote loop recorder check  9.15.20: 1 false AF=SB/1st deg AVB/PACs  ? Stroke [facility])   ? Thyroid disease   ? ?Past Surgical History:  ?Past Surgical History:  ?Procedure Laterality Date  ? BACK SURGERY    ? LOOP RECORDER IMPLANT    ? ? ?Assessment / Plan / Recommendation ?Clinical Impression Alan Rubio is a 88 year old right-handed male with history of hypertension, hyperlipidemia, atrial fibrillation, CVA 2020 status post loop recorder maintained on low-dose aspirin, hypothyroidism, type 2 diabetes mellitus.  Per chart review patient lives with spouse.  Modified independent with occasional use of a cane.  Two-level home bed and bath upstairs patient does have a chair lift on the stairs.  Presented 05/22/2021 with acute onset of slurred speech and bilateral lower extremity weakness.  EMS arrived blood pressure 183/77.  Cranial CT scan showed no acute intracranial abnormality.  Sequela of prior hemorrhage in the right frontal lobe and chronic microvascular ischemic disease.  Patient did not receive tPA.  Chest x-ray negative.  MRI of the brain showed acute perforator infarct of the parasagittal left pons.  Chronic infarcts  and chronic microvascular ischemic changes. Tolerating a regular consistency diet.  Therapy evaluations completed due to patient decreased functional mobility lower extremity weakness and dysarthric speech was admitted for a comprehensive rehab program. As per children, patient lives with wife who has some dementia.  ? ?Patient demonstrates moderate cognitive impairments characterized by decreased attention, functional problem solving, recall of functional information, executive functions, and emergent awareness which impacts his safety with functional and familiar tasks. Comprehensive assessment was limited secondary to lethargy/fatigue. Spouse/son endorse mild unresolved cognitive impairment from prior CVA in 2020, however felt he nearly returned to baseline. Pt managed medications at prior level, while son managed bills. Otherwise, family report pt was mostly independent. SLP administered the [facility] Mental Status Examination (SLUMS) and patient scored 10/30 points with a score of 27 or above considered within the normal range. Suspect lethargy impacted performance. Patient's overall auditory comprehension and verbal expression appeared [facility] for all tasks assessed however further language assessment is recommended considering location of infarct, as well as family report of difficulty expressing thoughts - although this may attribute to dysarthria. Pt is hard of hearing and therefore benefited from increased volume and verbal repetition for verbal commands/questions/comments. Pt exhibited a moderate dysarthria characterized by reduced articulatory precision, and reduced vocal intensity which negatively impacted speech intelligibility at the word and sentence level. Limited informal speech/language assessment obtained d/t fatigue. Pt had lunch tray on table which was partially consumed. Family reported pt exhibited difficulty masticating and therefore  discontinued meal. Son requested soup from  kitchen to be delivered to room. BSE completed with thin liquids, dys 1, 2, and 3 textures  revealing a mild oral dysphagia. Pt exhibited decreased mastication, mildly delayed oral transit, trace-to-mild oral residuals, and no overt s/sx of aspiration among all tested consistencies. Pt with improved mastication effectiveness and efficiency with dys 2 textures. Considering current status and possibility of fatigue with intake, recommend a dysphagia 2 diet with thin liquids and meds whole in puree. Will continue to follow to assess for possible diet advancement as appropriate.  ? ?Patient would benefit from skilled SLP intervention to maximize cognitive, speech/language, and swallow functioning prior to discharge.  ?  ?Skilled Therapeutic Interventions          Pt participated in [place] Status Examination (SLUMS) as well as further non-standardized assessments of cognitive-linguistic, speech, and language function. BSE completed. Please see above.    ?  ?SLP Assessment ? Patient will need skilled Speech Lanaguage Pathology Services during CIR admission  ?  ?Recommendations ? SLP Diet Recommendations: Dysphagia 2 (Fine chop);Thin ?Liquid Administration via: Cup;Straw ?Medication Administration: Whole meds with puree ?Supervision: Staff to assist with self feeding;Full supervision/cueing for compensatory strategies ?Compensations: Minimize environmental distractions;Slow rate;Small sips/bites ?Postural Changes and/or Swallow Maneuvers: Seated upright 90 degrees ?Oral Care Recommendations: Oral care BID ?Patient destination: Home ?Follow up Recommendations: 24 hour supervision/assistance;Home Health SLP ?Equipment Recommended: None recommended by SLP  ?  ?SLP Frequency 3 to 5 out of 7 days   ?SLP Duration ? ?SLP Intensity ? ?SLP Treatment/Interventions 2.5-3 weeks ? ?Minumum of 1-2 x/day, 30 to 90 minutes ? ?Cognitive remediation/compensation;Environmental controls;Internal/external  aids;Speech/Language facilitation;Cueing hierarchy;Dysphagia/aspiration precaution training;Functional tasks;Therapeutic Activities;Patient/family education   ? ?Pain ?Pain Assessment ?Pain Scale: 0-10 ?Pain Score: 0-No pain ? ?Prior Functioning ?Cognitive/Linguistic Baseline: Baseline deficits ?Baseline deficit details: Family endorsed residual cognitive deficits following initial CVA including forgetfulness, son helping with bills. Family feel pt is currently below baseline ?Type of Home: House ? Lives With: Spouse (Of note, spouse with dementia per her report) ?Available Help at Discharge: Family;Available PRN/intermittently;Available 24 hours/day (son lives 15 minutes away and will be able to provide 24/7 care for first 2 weeks) ?Education: HS ?Vocation: Retired ? ?SLP Evaluation ?Cognition ?Overall Cognitive Status: Impaired/Different from baseline ?Arousal/Alertness: Lethargic ?Orientation Level: Oriented to person;Oriented to place;Disoriented to time;Disoriented to situation ?Year: 2023 ?Month: June ?Day of Week: Incorrect ?Attention: Focused;Sustained ?Focused Attention: Impaired ?Sustained Attention: Impaired ?Memory: Impaired ?Memory Impairment: Retrieval deficit;Storage deficit ?Awareness: Impaired ?Awareness Impairment: Emergent impairment ?Problem Solving: Impaired ?Problem Solving Impairment: Functional basic;Verbal basic ?Executive Function: Organizing ?Sequencing: Appears intact ?Organizing: Impaired ?Organizing Impairment: Functional basic ?Decision Making: Appears intact ?Initiating: Impaired ?Initiating Impairment: Functional basic ?Behaviors: Lability (weeping at times without warning; need to assess further) ?Safety/Judgment: Impaired  ?Comprehension ?Auditory Comprehension ?Overall Auditory Comprehension: Appears within functional limits for tasks assessed ?Interfering Components: Hearing ?EffectiveTechniques: Extra processing time;Repetition ?Visual Recognition/Discrimination ?Discrimination:  Not tested ?Reading Comprehension ?Reading Status: Not tested ?Expression ?Expression ?Primary Mode of Expression: Verbal ?Verbal Expression ?Overall Verbal Expression: Other (comment) (Appears grossly intact; needs furth

## 2021-05-26 NOTE — Plan of Care (Signed)
?  Problem: Consults ?Goal: RH STROKE PATIENT EDUCATION ?Description: See Patient Education module for education specifics  ?Outcome: Progressing ?  ?Problem: RH BOWEL ELIMINATION ?Goal: RH STG MANAGE BOWEL WITH ASSISTANCE ?Description: STG Manage Bowel with mod I  Assistance. ?Outcome: Progressing ?Goal: RH STG MANAGE BOWEL W/MEDICATION W/ASSISTANCE ?Description: STG Manage Bowel with Medication with mod I Assistance. ?Outcome: Progressing ?  ?Problem: RH BLADDER ELIMINATION ?Goal: RH STG MANAGE BLADDER WITH ASSISTANCE ?Description: STG Manage Bladder With mod I /toileting Assistance ?Outcome: Progressing ?Goal: RH STG MANAGE BLADDER WITH MEDICATION WITH ASSISTANCE ?Description: STG Manage Bladder With Medication With mod I  Assistance. ?Outcome: Progressing ?  ?Problem: RH SAFETY ?Goal: RH STG ADHERE TO SAFETY PRECAUTIONS W/ASSISTANCE/DEVICE ?Description: STG Adhere to Safety Precautions With cues Assistance/Device. ?Outcome: Progressing ?  ?Problem: RH PAIN MANAGEMENT ?Goal: RH STG PAIN MANAGED AT OR BELOW PT'S PAIN GOAL ?Description: At or below level 4 w prns ?Outcome: Progressing ?  ?Problem: RH KNOWLEDGE DEFICIT ?Goal: RH STG INCREASE KNOWLEDGE OF DIABETES ?Description: Patient will be able to manage DM with medications and dietary modifications using handouts and educational resources independently ?Outcome: Progressing ?Goal: RH STG INCREASE KNOWLEDGE OF HYPERTENSION ?Description: Patient will be able to manage HTN with medications and dietary modifications using handouts and educational resources independently ?Outcome: Progressing ?Goal: RH STG INCREASE KNOWLEGDE OF HYPERLIPIDEMIA ?Description: Patient will be able to manage HLD with medications and dietary modifications using handouts and educational resources independently ?Outcome: Progressing ?Goal: RH STG INCREASE KNOWLEDGE OF STROKE PROPHYLAXIS ?Description: Patient will be able to manage secondary stroke risks with medications and dietary  modifications using handouts and educational resources independently ?Outcome: Progressing ?  ?

## 2021-05-27 DIAGNOSIS — I639 Cerebral infarction, unspecified: Secondary | ICD-10-CM | POA: Diagnosis not present

## 2021-05-27 LAB — GLUCOSE, CAPILLARY
Glucose-Capillary: 148 mg/dL — ABNORMAL HIGH (ref 70–99)
Glucose-Capillary: 155 mg/dL — ABNORMAL HIGH (ref 70–99)
Glucose-Capillary: 197 mg/dL — ABNORMAL HIGH (ref 70–99)
Glucose-Capillary: 210 mg/dL — ABNORMAL HIGH (ref 70–99)

## 2021-05-27 MED ORDER — SERTRALINE HCL 50 MG PO TABS
25.0000 mg | ORAL_TABLET | Freq: Every day | ORAL | Status: DC
  Administered 2021-05-27 – 2021-06-09 (×14): 25 mg via ORAL
  Filled 2021-05-27 (×14): qty 1

## 2021-05-27 MED ORDER — DOXAZOSIN MESYLATE 2 MG PO TABS
2.0000 mg | ORAL_TABLET | Freq: Two times a day (BID) | ORAL | Status: DC
Start: 2021-05-28 — End: 2021-06-05
  Administered 2021-05-28 – 2021-06-05 (×17): 2 mg via ORAL
  Filled 2021-05-27 (×18): qty 1

## 2021-05-27 MED ORDER — MEGESTROL ACETATE 400 MG/10ML PO SUSP
400.0000 mg | Freq: Two times a day (BID) | ORAL | Status: DC
  Administered 2021-05-27 – 2021-06-06 (×21): 400 mg via ORAL
  Filled 2021-05-27 (×22): qty 10

## 2021-05-27 NOTE — Progress Notes (Signed)
?                                                       PROGRESS NOTE ? ? ?Subjective/Complaints: ?Does not remember meeting yesterday  ?Ate a banana this am , no c/os ?DIscussed with OT , transfer was Mod A bed to stand  ?ROS- neg CP, SOB, N/V/D ? ?Objective: ?  ?No results found. ?Recent Labs  ?  05/25/21 ?1613 05/26/21 ?8756  ?WBC 6.1 7.4  ?HGB 13.8 14.2  ?HCT 40.5 40.3  ?PLT 209 189  ? ? ?Recent Labs  ?  05/25/21 ?0102 05/25/21 ?1613 05/26/21 ?4332  ?NA 134*  --  136  ?K 3.7  --  3.7  ?CL 104  --  104  ?CO2 21*  --  23  ?GLUCOSE 156*  --  120*  ?BUN 16  --  15  ?CREATININE 0.77 0.84 0.73  ?CALCIUM 9.1  --  9.4  ? ? ? ?Intake/Output Summary (Last 24 hours) at 05/27/2021 0912 ?Last data filed at 05/27/2021 0816 ?Gross per 24 hour  ?Intake 340 ml  ?Output 300 ml  ?Net 40 ml  ? ?  ? ?  ? ?Physical Exam: ?Vital Signs ?Blood pressure (!) 174/70, pulse 64, temperature 98 ?F (36.7 ?C), temperature source Oral, resp. rate 16, height 6' (1.829 m), weight 75.4 kg, SpO2 99 %. ? ? ?General: No acute distress ?Mood and affect are appropriate ?Heart: Regular rate and rhythm no rubs murmurs or extra sounds ?Lungs: Clear to auscultation, breathing unlabored, no rales or wheezes ?Abdomen: Positive bowel sounds, soft nontender to palpation, nondistended ?Extremities: No clubbing, cyanosis, or edema ?Skin: No evidence of breakdown, no evidence of rash ?Neurologic: Cranial nerves II through XII intact, motor strength is 4/5 in bilateral deltoid, bicep, tricep, grip, Left hip flexor, knee extensors, ankle dorsiflexor and plantar flexor ?3- BLE  ?Sensory exam normal sensation to light touch  in bilateral upper and lower extremities ? ?Musculoskeletal: Full range of motion in all 4 extremities. No joint swelling ? ? ?Assessment/Plan: ?1. Functional deficits which require 3+ hours per day of interdisciplinary therapy in a comprehensive inpatient rehab setting. ?Physiatrist is providing close team supervision and 24 hour management of  active medical problems listed below. ?Physiatrist and rehab team continue to assess barriers to discharge/monitor patient progress toward functional and medical goals ? ?Care Tool: ? ?Bathing ?   ?Body parts bathed by patient: Right arm, Left arm, Chest, Abdomen, Face  ?   ?  ?  ?Bathing assist Assist Level: Minimal Assistance - Patient > 75% ?  ?  ?Upper Body Dressing/Undressing ?Upper body dressing   ?What is the patient wearing?: Pull over shirt ?   ?Upper body assist Assist Level: Minimal Assistance - Patient > 75% ?   ?Lower Body Dressing/Undressing ?Lower body dressing ? ? ?   ?What is the patient wearing?: Pants ? ?  ? ?Lower body assist Assist for lower body dressing: Maximal Assistance - Patient 25 - 49% ?   ? ?Toileting ?Toileting    ?Toileting assist Assist for toileting: Moderate Assistance - Patient 50 - 74% ?  ?  ?Transfers ?Chair/bed transfer ? ?Transfers assist ?   ? ?Chair/bed transfer assist level: Minimal Assistance - Patient > 75% ?  ?  ?Locomotion ?Ambulation ? ? ?Ambulation assist ? ?   ? ?  Assist level: Moderate Assistance - Patient 50 - 74% ?Assistive device: Hand held assist ?Max distance: 37f  ? ?Walk 10 feet activity ? ? ?Assist ? Walk 10 feet activity did not occur: Safety/medical concerns (required use of AD) ? ?  ?   ? ?Walk 50 feet activity ? ? ?Assist Walk 50 feet with 2 turns activity did not occur: Safety/medical concerns (required use of AD) ? ?  ?   ? ? ?Walk 150 feet activity ? ? ?Assist Walk 150 feet activity did not occur: Safety/medical concerns ? ?  ?  ?  ? ?Walk 10 feet on uneven surface  ?activity ? ? ?Assist Walk 10 feet on uneven surfaces activity did not occur: Safety/medical concerns ? ? ?  ?   ? ?Wheelchair ? ? ? ? ?Assist Is the patient using a wheelchair?: Yes (used for transport to/from gym) ?Type of Wheelchair: Manual ?  ? ?Wheelchair assist level: Dependent - Patient 0% ?   ? ? ?Wheelchair 50 feet with 2 turns activity ? ? ? ?Assist ? ?  ?  ? ? ?Assist Level:  Dependent - Patient 0%  ? ?Wheelchair 150 feet activity  ? ? ? ?Assist ?   ? ? ?Assist Level: Dependent - Patient 0%  ? ?Blood pressure (!) 174/70, pulse 64, temperature 98 ?F (36.7 ?C), temperature source Oral, resp. rate 16, height 6' (1.829 m), weight 75.4 kg, SpO2 99 %. ? ?1. 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            -patient may shower ?            -ELOS/Goals: 7-10 days ?            -Admit to CIR PT, OT, SLP evals, team conf in am discussed with wife  ?2.  Antithrombotics: ?-DVT/anticoagulation:  Pharmaceutical: Lovenox ?            -antiplatelet therapy: Aspirin 81 mg daily and Plavix 75 mg daily x3 weeks then Plavix alone ?3. Pain Management: Tylenol as needed ?4. Mood: Melatonin 1.5 mg nightly provide emotional support ?            -antipsychotic agents: N/A ?5. Neuropsych: This patient is capable of making decisions on his own behalf. ?6. Skin/Wound Care: Routine skin checks ?7. Fluids/Electrolytes/Nutrition: Routine in and outs with follow-up chemistries ?8.  Type 2 diabetes mellitus.  Hemoglobin A1c 7.7.  Jardiance 25 mg daily as well as SSI.  Actos 50 mg daily on hold resume as  needed ?CBG (last 3)  ?Recent Labs  ?  05/26/21 ?1625 05/26/21 ?2137 05/27/21 ?01027 ?GLUCAP 178* 178* 148*  ? ?CBG ok , cont current regimen , may need to increase if appetite increases , add ensure  ? ?9.  Hypertension.  Well controlled, continue Cozaar 50 mg daily, Cardura 4 mg daily ?Vitals:  ? 05/26/21 1929 05/27/21 0547  ?BP: (!) 147/79 (!) 174/70  ?Pulse: 80 64  ?Resp: 14 16  ?Temp: 97.8 ?F (36.6 ?C) 98 ?F (36.7 ?C)  ?SpO2: 98% 99%  ?Some lability cont to monitor  ? ?10.  Hyperlipidemia.  Continue Lipitor 40 mg daily, Zetia 10 mg daily ?11.  History of gout.  Continue Zyloprim 300 mg daily.  Monitor for  any gout flareups ?12.  Hypothyroidism.  Continue Synthroid ?  ? ?  LOS: ?2 days ?A FACE TO FACE EVALUATION WAS PERFORMED ? ?Alan Rubio ?05/27/2021, 9:12 AM  ? ? ? ?

## 2021-05-27 NOTE — Patient Care Conference (Signed)
Inpatient RehabilitationTeam Conference and Plan of Care Update ?Date: 05/27/2021   Time: 10:33 AM  ? ? ?Patient Name: Alan Rubio      ?Medical Record Number: [id number]  ?Date of Birth: [birth year] ?Sex: Male         ?Room/Bed: 5C06C/5C06C-01 ?Payor Info: Payor: MEDICARE / Plan: MEDICARE PART A AND B / Product Type: *No Product type* /   ? ?Admit Date/Time:  05/25/2021  3:54 PM ? ?Primary Diagnosis:  Left pontine cerebrovascular accident [facility]) ? ?Hospital Problems: Principal Problem: ?  Left pontine cerebrovascular accident [facility]) ? ? ? ?Expected Discharge Date: Expected Discharge Date: 06/10/21 ? ?Team Members Present: ?Physician leading conference: Dr. Alysia Penna ?Social Worker Present: Erlene Quan, BSW ?Nurse Present: Dorien Chihuahua, RN ?PT Present: Page Spiro, PT ?OT Present: Willeen Cass, Ludwig Lean, COTA ?SLP Present: Sherren Kerns, SLP ?PPS Coordinator present : Gunnar Fusi, SLP ? ?   Current Status/Progress Goal Weekly Team Focus  ?Bowel/Bladder ? ? Continenet Of B/B. Last Bm 05/25/21.  Remain continent  Continued assistance with toileting needs.   ?Swallow/Nutrition/ Hydration ? ? dys 2 diet, thin liquids  mod I  tolerance of current diet, dys 3 trials if/when appropriate   ?ADL's ? ? MIN A for bathing from EOB, MOD A for sit>stand, MIN A to stand pivot with Rw, MIN A UB dressing, MAX A for LB dressing. limited by fatigue, cognition  CGA - Supervision  BADL reeducation, activity tolerance, dynamic sitting/standing balance   ?Mobility ? ? mod assist supine<>sit, mod assist sit<>stand and stand pivot transfers, min/mod assist gait up to 66f using RW with short, shuffled steps and excessive trunk/hip flexion - orthostatic hypotension - very slow movements - R LE weaker than L LE  supervision overall at household level  activity tolerance, pt/family education, bed mobility retraining, transfer training, gait training, DME training   ?Communication ? ? mod A  min A  speech intelligiblity  strategies   ?Safety/Cognition/ Behavioral Observations ? mod A  min A  orientation to time, functional memory, problm solving   ?Pain ? ? no complaints of pain  remain free of pain/discomfort  continued pain assessment routinely QS & prn   ?Skin ? ?           ? ? ?Discharge Planning:  ?Discharging home with son able to provide MIN A 24/7   ?Team Discussion: ?Patient post left pontine CVA; hx of SVD and previous strokes. CBGs elevated; MD monitoring. Note emotional lability/pseudobulbar affect; MD addressed. Slow shuffled gait with mod cognitive impairment; attention deficits, problem solving issues, awareness with previous unresolved cognitive issues from previous CVAs. ? ?Patient on target to meet rehab goals: ?yes, currently needs mod assist fo sit - stand due to posterior lean. Able to ambulate up to 625 with min - mod assist. Nees min assist for upper body care and max assist for lower body care. Need min assist to incorporate compensatory strategies and orientation.  Goals for discharge set for supervision overall. ? ?*See Care Plan and progress notes for long and short-term goals.  ? ?Revisions to Treatment Plan:  ?Appetite stimulant added per MD; poor appetite ?  ?Teaching Needs: ?Safety, compensatory strategies, transfers, toileting, medication management and secondary risk management  ?Current Barriers to Discharge: ?Decreased caregiver support and wife has dementia ? ?Possible Resolutions to Barriers: ?Family education ?HH follow up services ?DME: RW,.  ?  ? ? Medical Summary ?Current Status: No severe weakness due to current CVA, history of multiple CVAs with  cognitive deficits ? Barriers to Discharge: Medical stability;Incontinence ?  ?Possible Resolutions to [employer] Focus: Multi-infarct dementia ? ? ?Continued Need for Acute Rehabilitation Level of Care: The patient requires daily medical management by a physician with specialized training in physical medicine and rehabilitation for the  following reasons: ?Direction of a multidisciplinary physical rehabilitation program to maximize functional independence : Yes ?Medical management of patient stability for increased activity during participation in an intensive rehabilitation regime.: Yes ?Analysis of laboratory values and/or radiology reports with any subsequent need for medication adjustment and/or medical intervention. : Yes ? ? ?I attest that I was present, lead the team conference, and concur with the assessment and plan of the team. ? ? ?Dorien Chihuahua B ?05/27/2021, 4:35 PM  ? ? ? ? ? ? ?

## 2021-05-27 NOTE — Progress Notes (Signed)
Physical Therapy Session Note ? ?Patient Details  ?Name: Alan Rubio ?MRN: [id number] ?Date of Birth: [birth year] ? ?Today's Date: 05/27/2021 ?PT Individual Time: 0814-4818 and [address] ?PT Individual Time Calculation (min): 56 min  and 56 min ? ?Short Term Goals: ?Week 1:  PT Short Term Goal 1 (Week 1): Pt will perform supine<>sit with min assist ?PT Short Term Goal 2 (Week 1): Pt will perform sit<>stands using LRAD with min assist ?PT Short Term Goal 3 (Week 1): Pt will perform bed<>chair transfers using LRAD with min assist ?PT Short Term Goal 4 (Week 1): Pt will ambulate at least 48f using LRAD with CGA ?PT Short Term Goal 5 (Week 1): Pt will participate in standardized outcome measure assessing his balance ? ?Skilled Therapeutic Interventions/Progress Updates:  ?  Session 1: Pt received sitting in recliner and agreeable to therapy session. Put in dentures set-up assist. Sit>stand recliner>RW with heavy mod assist for lifting to stand and balance due to strong posterior lean - very slow to rise to stand and requires cuing to improved trunk/hip extension for upright posture - more difficulty coming to stand from this lower surface. Gait training ~125fto w/c in room using RW with min assist for balance - very slow gait speed with excessive trunk/hip flexion causing crouched posture and decreased B LE step lengths. ? ? Transported to/from gym in w/c for time management and energy conservation. ? ?Gait training 6443fsing RW with light min assist for balance - continues to have very slow gait speed with continued excessive forward trunk/hip flexion as well as short, shuffled stepping - repeated cuing for upright posture, increased step lengths, and increased speed with manual facilitation. While ambulating pt again reports "feeling weak." ? ?Assessed vitals: ?After gait training, in sitting: BP 109/59 (MAP 75), HR 96bpm, which was a significant decline compared to during OT session this AM ?Provided pt with water and  educated pt on ensuring adequate intake - pt consumed 1 full cup of water. ?Reassessed after ~5mi15mes in sitting: BP 94/63 (MAP 73), HR 89bpm rated lightheadedness as 5/10 (scale of 0/10 = none and 10/10 = means close to passing out). ?Donned thigh high TED hose - pt states the compression feels good on his LEs. ?Reassessed after donning TEDs, in sitting: BP 99/89 (MAP 94), HR 88bpm 4/10 dizziness with minimal to no improvement in symptoms. ?Notified nurse at end of session of pt's low BP. ? ?Pt suddenly reports quick urgency to urinate and voids within seconds incontinently in brief. ? ?Transported back to room.  ? ?Stand pivot w/c>recliner using RW with light mod assist to come into standing - continues to be slow to rise and have posterior lean requiring cuing for improvement.  ? ?Standing with B UE support on RW and CGA/light min assist for balance during dependent assist LB clothing management and peri-care for time management. ? ?Pt's family arrived and pt suddenly became tearful and crying - therapist provided emotional support and encouragement on his CLOF. ? ?Pt left seated in recliner with needs in reach, B LEs elevated, seat belt alarm on, and family present. ? ? ? ?Session 2: Pt received sitting in recliner with his family present and pt agreeable to therapy session. Pt still wearing thigh high TED hose. Vitals assessed as described below. Sit>stand recliner>RW with mod assist for lifting to stand and facilitating anterior trunk lean - pt continues to be slow to rise with poor anterior trunk lean to initiate transfer followed by delayed hip/knee extension to come  upright - pt does report some fear of falling during this stating he "can't look up" because that will make him fall down. Short distance ambulatory transfer recliner>w/c using RW with light min assist for balance - continues to have very slow speed of movements, slow gait speed, excessive anterior trunk/hip flexion, with decreased B LE step  lengths and foot clearances.  ? ?Vitals in sitting, at start of session: BP 109/60 (MAP 75), HR 85bpm  ?After short distance gait transfer to w/c: BP 92/58 (MAP 68), HR 96bpm  ? ?Transported to/from gym in w/c for time management and energy conservation. Short distance ambulatory transfer w/c>Nustep using RW with assist and cuing as above.  ? ?Performed B LE and B UE reciprocal movement pattern retraining on Nustep against level 5 resistance for 5 minutes totaling 155 steps with goal of keeping steps per minute >30, which pt succeeded - R hand repeatedly kept falling off of handle requiring cuing to attend and initiate re-grasping the handle each time.  ? ?Performed B LE only, reciprocal movements on Nustep targeting LE strengthening and quicker, larger movements for 3 minutes against level 4 resistance totaling 73 steps. Pt suddenly looks very fatigued/lethargic, keeping eyes closed, and with delayed or no response to therapist's questions. Emergency called for nurse assistance because pt unable to come to standing despite 1x attempt with heavy max assist of 1 from therapist. Requires 3+ max/total assist to perform safe L squat pivot transfer Nustep>w/c. Assessed BP to be BP 75/44 (MAP 54) once in wheelchair. Quickly transported pt back to room. L squat pivot w/c>EOB with +2 total assist for lifting/pivoting hips. Sit>supine with +2 total assist for pt safety. Reassessed vitals in supine: BP 113/65 (MAP 80), HR 82bpm Dan, PA made aware of pt's low BP and events of the session. Nurse planning to continue monitoring pt's BP. ? ?Therapist reinforced education to family on ensuring pt with enough fluid intake once awake and upright. Pt left supine in bed with needs in reach, family present, and bed alarm on. ? ?Therapy Documentation ?Precautions:  ?Precautions ?Precautions: Fall, Other (comment) ?Precaution Comments: HOH, orthostatic ?Restrictions ?Weight Bearing Restrictions: No ? ? ?Pain: ? Session 1: No reports or  indications of pain during session. ? ?Session 2: Reports some L hip pain when his L LE would "fall" off the Nustep foot pedal due to hip flexor weakness - therapist readjusted seat position to avoid this from happening - pt reports improvement in his symptoms. ? ?Therapy/Group: Individual Therapy ? ?Tawana Scale , PT, DPT, NCS, CSRS ?05/27/2021, 7:54 AM  ?

## 2021-05-27 NOTE — Progress Notes (Signed)
Occupational Therapy Session Note ? ?Patient Details  ?Name: Alan Rubio ?MRN: [id number] ?Date of Birth: May 26, 1931 ? ?Today's Date: 05/27/2021 ?OT Individual Time: [phone] ?OT Individual Time Calculation (min): 78 min  ? ? ?Short Term Goals: ?Week 1:  OT Short Term Goal 1 (Week 1): Pt will complete sit<>stand using RW with min assist in prep for LB ADLs ?OT Short Term Goal 2 (Week 1): Pt will complete stand pivot <> BSC with mod assist in prep for toileting ?OT Short Term Goal 3 (Week 1): Pt will donn/doff pull up and pants with min assist. ?OT Short Term Goal 4 (Week 1): Pt will bathe LB at sit<>stand level using LHS and shower bench as needed with min assist. ? ?Skilled Therapeutic Interventions/Progress Updates:  ?Pt greeted  in supine reporting fatigue and not wanting to mobilize OOB, with encouragement and + time pt  agreeable to OT intervention. Session focus on BADL reeducation, functional mobility, dynamic standing balance and decreasing overall caregiver burden.   ?Pt needed MAX A to transition from supine>sit to L side of bed with pt needing assist to advance RLE fully to EOB and MAX A to elevate trunk. Step by step cues needed to sequence all mobility steps.  ?Once EOB pt needed at least CGA for static sitting balance progressing to MIN A with dynamic tasks. Pt was able to bathe UB with MIN A but then reports fatigue needing to lay back down, vitals assessed from supine: ?174/88 ( 113) HR 72       ?Provided pt with supine rest break to recover, pt then agreeable to transition to EOB again with MAX A. Pt completed stand pivot from EOB>recliner with RW, MODA to rise into standing, MINA  to pivot with RW. Pt completed dressing from recliner with pt needing MAX A for LB dressing via sit>stand heavy MOD  to rise from recliner with RW. MIN A for UB dressing with + time.  ?Pt continues to state, "I just dont feel good" but not specific ailments ever verbalized, vitals assessed from recliner 159/82 ( 105) HR 72.   ?Pt reports he didn't have breakfast and requested a banana that was already in pts room, set pt up with banana with  pt able to self feed with supervision with RUE. Intial impair coordination noted with hand to mouth pattern but motor planning improved with repetition. Pt completed oral care with MIN A to remove dentures and let them soak.  pt left seated in recliner with alarm belt activated and all needs within reach.                    ? ? ?Therapy Documentation ?Precautions:  ?Precautions ?Precautions: Fall, Other (comment) ?Precaution Comments: HOH, orthostatic ?Restrictions ?Weight Bearing Restrictions: No ? ?Pain: no pain reported during session  ? ? ? ?Therapy/Group: Individual Therapy ? ?Precious Haws ?05/27/2021, 10:54 AM ?

## 2021-05-27 NOTE — Progress Notes (Signed)
Inpatient Rehabilitation Care Coordinator ?Assessment and Plan ?Patient Details  ?Name: Alan Rubio ?MRN: [id number] ?Date of Birth: 16-Oct-1931 ? ?Today's Date: 05/27/2021 ? ?Hospital Problems: Principal Problem: ?  Left pontine cerebrovascular accident [facility]) ? ?Past Medical History:  ?Past Medical History:  ?Diagnosis Date  ? A-fib ([place])   ? Cancer [facility])   ? Diabetes mellitus without complication ([place])   ? Encounter for loop recorder check 09/15/2018  ? H/O: stroke 04/14/2018  ? Hyperlipemia 06/13/2018  ? Hypertension   ? Hypothyroidism 06/13/2018  ? 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 Remote loop recorder check  9.15.20: 1 false AF=SB/1st deg AVB/PACs  ? Stroke [facility])   ? Thyroid disease   ? ?Past Surgical History:  ?Past Surgical History:  ?Procedure Laterality Date  ? BACK SURGERY    ? LOOP RECORDER IMPLANT    ? ?Social History:  reports that he has never smoked. He has never used smokeless tobacco. He reports current alcohol use. He reports that he does not use drugs. ? ?Family / Support Systems ?Spouse/Significant Other: Horris Latino (Spouse) ?Children: Jeneen Rinks (Son) ?Anticipated Caregiver: Frederich Cha ?Ability/Limitations of Caregiver: Min A ?Caregiver Availability: 24/7 ?Family Dynamics: support from spouse and son ? ?Social History ?Preferred language: English ?Religion: [place] - How often do you need to have someone help you when you read instructions, pamphlets, or other written material from your doctor or pharmacy?: Never ?Legal History/Current Legal Issues: n/a ?Guardian/Conservator: n/a  ? ?Abuse/Neglect ?Abuse/Neglect Assessment Can Be Completed: Yes ?Physical Abuse: Denies ?Verbal Abuse: Denies ?Sexual Abuse: Denies ?Exploitation of patient/patient's resources: Denies ?Self-Neglect: Denies ? ?Patient response to: ?Social Isolation - How often do you feel lonely or isolated from those around you?: Never ? ?Emotional Status ?Recent Psychosocial Issues:  coping ?Psychiatric History: n/a ?Substance Abuse History: n/a ? ?Patient / Family Perceptions, Expectations & Goals ?Pt/Family understanding of illness & functional limitations: yes ?Premorbid pt/family roles/activities: Primarily went out for appointments ?Anticipated changes in roles/activities/participation: son able to provide some assistance. ?Pt/family expectations/goals: Supervision ? ?Community Resources ?Premorbid Home Care/DME Agencies: Other (Comment) [facility], Patient has high lift chair) ?Transportation available at discharge: son able to transport ? ?Discharge Planning ?Living Arrangements: Spouse/significant other ?Support Systems: Spouse/significant other, Children ?Type of Residence: Private residence ?Insurance Resources: [employer], [employer] (specify) ?Financial Screen Referred: No ?Living Expenses: Lives with family ?Money Management: Patient, Spouse ?Does the patient have any problems obtaining your medications?: No ?Home Management: Independent ?Patient/Family Preliminary Plans: Spouse able to assist if needed ?Care Coordinator Barriers to Discharge: Decreased caregiver support ?Care Coordinator Anticipated Follow Up Needs: HH/OP ?Expected length of stay: 14-16 Days ? ?Clinical Impression ?SW met with patient on 5/2, introduced self and explained role. Patient discharging home with assistance from son able to provide MIN A. No additional questions or concerns, sw will continue to follow up with patient and son.  ? ?Dyanne Iha ?05/27/2021, 9:41 AM ? ?  ?

## 2021-05-27 NOTE — Progress Notes (Signed)
Speech Language Pathology Daily Session Note ? ?Patient Details  ?Name: Alan Rubio ?MRN: [id number] ?Date of Birth: 1931/04/01 ? ?Today's Date: 05/27/2021 ?SLP Missed Time: 60 Minutes ?Missed Time Reason: Patient fatigue ? ?Pt received sleeping supine in bed on arrival and roused to min verbal stimuli. Pt was accompanied by his family at bedside. PT notified SLP that pt's blood pressure became low during recent PT session.Pt reported fatigue requested to rest at this time. Pt missed 60 minutes of skilled ST intervention. Pt was left laying supine with bed alarm activated, immediate needs within reach, and son/spouse at bedside. ? ?Patty Sermons ?05/27/2021, 4:31 PM ?

## 2021-05-28 LAB — GLUCOSE, CAPILLARY
Glucose-Capillary: 187 mg/dL — ABNORMAL HIGH (ref 70–99)
Glucose-Capillary: 203 mg/dL — ABNORMAL HIGH (ref 70–99)
Glucose-Capillary: 213 mg/dL — ABNORMAL HIGH (ref 70–99)
Glucose-Capillary: 223 mg/dL — ABNORMAL HIGH (ref 70–99)

## 2021-05-28 LAB — BASIC METABOLIC PANEL
Anion gap: 9 (ref 5–15)
BUN: 30 mg/dL — ABNORMAL HIGH (ref 8–23)
CO2: 26 mmol/L (ref 22–32)
Calcium: 10 mg/dL (ref 8.9–10.3)
Chloride: 104 mmol/L (ref 98–111)
Creatinine, Ser: 0.97 mg/dL (ref 0.61–1.24)
GFR, Estimated: >60 mL/min (ref >60)
Glucose, Bld: 197 mg/dL — ABNORMAL HIGH (ref 70–99)
Potassium: 3.9 mmol/L (ref 3.5–5.1)
Sodium: 139 mmol/L (ref 135–145)

## 2021-05-28 NOTE — Progress Notes (Addendum)
Speech Language Pathology Daily Session Note ? ?Patient Details  ?Name: Alan Rubio ?MRN: [id number] ?Date of Birth: 1931-02-14 ? ?Today's Date: 05/28/2021 ?SLP Individual Time: 9924-2683 ?SLP Individual Time Calculation (min): 60 min ? ?Short Term Goals: ?Week 1: SLP Short Term Goal 1 (Week 1): Patient will consume current diet with minimal overt s/s of aspiration and min A cues for safe swallowing precautions/strategies ?SLP Short Term Goal 2 (Week 1): Patient will orient x4 with min A for use of external aids ?SLP Short Term Goal 3 (Week 1): Patient will complete basic-to-mildly complex problem solving tasks with mod A verbal/visual cues ?SLP Short Term Goal 4 (Week 1): Patient will utilize external memory aids to increase functional recall with mod A verbal/visual cues ?SLP Short Term Goal 5 (Week 1): Patient will implement speech intelligiblity strategies at the phrase/sentence level with mod A verbal cues to reach 75% intelligiblity ? ?Skilled Therapeutic Interventions: Skilled ST treatment focused on speech and cognitive goals. Pt was accompanied by spouse and son at bedside. They report he was more "alert" today; poor appetite. Pt reports satisfaction to texture of food - dysphagia 2 diet. Family reporting increased encouragement to consume proteins. Patient was received supine on arrival. Prefers to keep eyes closed for most of session but participative and opens eyes when necessary. SLP facilitated education on speech strategies using SLOP acronym (slow, loud, over-articulate, pause between words). Pt implemented at the word and sentence level with mod fading to min A verbal cues/reminders throughout session. Pt continues to be limited by vocal hoarseness and reduced vocal intensity. Pt required total A to recall speech strategies by end of session. SLP facilitated orientation with min A verbal cues to orient to time and situation. Pt unaware of why he was in the hospital. When told "you had a stroke" he seemed  surprised. However, following additional discussion pt later stated "I knew I had a stroke" and didn't appear to have the awareness that just a few moments before was completely unaware of this and required verbal explanation from SLP. Son expressed orientation to situation "comes and goes." SLP and pt completing medication management task to increase awareness of current medication regime, as pt was independent with managing medications at prior level. Pt stated "I don't think I could do it now" d/t memory deficits. SLP reviewed medications from prior level via picture of med list on son's phone. Pt exhibited some familiarity of medication names, however 0% accuracy for recalling purposes given max A field of 2 choices. SLP facilitated prescription label comprehension with sup A cues and problem solving scenarios with suo-to-min A verbal cues for safety and reasoning. Pt indicated interest in using a pillbox for organizing meds discharge. SLP to facilitate training on use during upcoming sessions. Son states he can provide assistance with meds at discharge; he also provides help with pt's spouse. Patient was left in bed with alarm activated and immediate needs within reach at end of session. Continue per current plan of care.   ?   ? ?Pain ?Pain Assessment ?Pain Scale: 0-10 ?Pain Score: 0-No pain ?Pain Type: Acute pain ?Pain Location: Neck ?Pain Orientation: Posterior ?Pain Descriptors / Indicators: Aching ?Pain Frequency: Rarely ?Pain Onset: Gradual ?Patients Stated Pain Goal: 0 ?Pain Intervention(s): Medication (See eMAR) ? ?Therapy/Group: Individual Therapy ? ?Alan Rubio ?05/28/2021, 4:04 PM ?

## 2021-05-28 NOTE — Progress Notes (Signed)
?                                                       PROGRESS NOTE ? ? ?Subjective/Complaints: ? ?More alert this am , reviewed BP meds  ?ROS- neg CP, SOB, N/V/D ? ?Objective: ?  ?No results found. ?Recent Labs  ?  05/25/21 ?1613 05/26/21 ?9509  ?WBC 6.1 7.4  ?HGB 13.8 14.2  ?HCT 40.5 40.3  ?PLT 209 189  ? ? ?Recent Labs  ?  05/25/21 ?1613 05/26/21 ?3267  ?NA  --  136  ?K  --  3.7  ?CL  --  104  ?CO2  --  23  ?GLUCOSE  --  120*  ?BUN  --  15  ?CREATININE 0.84 0.73  ?CALCIUM  --  9.4  ? ? ? ?Intake/Output Summary (Last 24 hours) at 05/28/2021 0849 ?Last data filed at 05/28/2021 0600 ?Gross per 24 hour  ?Intake 720 ml  ?Output 700 ml  ?Net 20 ml  ? ?  ? ?  ? ?Physical Exam: ?Vital Signs ?Blood pressure (!) 159/119, pulse 69, temperature 98.8 ?F (37.1 ?C), temperature source Oral, resp. rate 16, height 6' (1.829 m), weight 75.4 kg, SpO2 98 %. ? ? ?General: No acute distress ?Mood and affect are appropriate ?Heart: Regular rate and rhythm no rubs murmurs or extra sounds ?Lungs: Clear to auscultation, breathing unlabored, no rales or wheezes ?Abdomen: Positive bowel sounds, soft nontender to palpation, nondistended ?Extremities: No clubbing, cyanosis, or edema ?Skin: No evidence of breakdown, no evidence of rash ?Neurologic: Cranial nerves II through XII intact, motor strength is 4/5 in bilateral deltoid, bicep, tricep, grip, Left hip flexor, knee extensors, ankle dorsiflexor and plantar flexor ?3- BLE  ?Sensory exam normal sensation to light touch  in bilateral upper and lower extremities ? ?Musculoskeletal: Full range of motion in all 4 extremities. No joint swelling ? ? ?Assessment/Plan: ?1. Functional deficits which require 3+ hours per day of interdisciplinary therapy in a comprehensive inpatient rehab setting. ?Physiatrist is providing close team supervision and 24 hour management of active medical problems listed below. ?Physiatrist and rehab team continue to assess barriers to discharge/monitor patient progress  toward functional and medical goals ? ?Care Tool: ? ?Bathing ?   ?Body parts bathed by patient: Right arm, Left arm, Chest, Abdomen, Face  ?   ?  ?  ?Bathing assist Assist Level: Minimal Assistance - Patient > 75% ?  ?  ?Upper Body Dressing/Undressing ?Upper body dressing   ?What is the patient wearing?: Pull over shirt ?   ?Upper body assist Assist Level: Minimal Assistance - Patient > 75% ?   ?Lower Body Dressing/Undressing ?Lower body dressing ? ? ?   ?What is the patient wearing?: Pants ? ?  ? ?Lower body assist Assist for lower body dressing: Maximal Assistance - Patient 25 - 49% ?   ? ?Toileting ?Toileting    ?Toileting assist Assist for toileting: Moderate Assistance - Patient 50 - 74% ?  ?  ?Transfers ?Chair/bed transfer ? ?Transfers assist ?   ? ?Chair/bed transfer assist level: Moderate Assistance - Patient 50 - 74% ?Chair/bed transfer assistive device: Armrests, Walker ?  ?Locomotion ?Ambulation ? ? ?Ambulation assist ? ?   ? ?Assist level: Minimal Assistance - Patient > 75% ?Assistive device: Walker-rolling ?Max distance: 62f  ? ?Walk 10  feet activity ? ? ?Assist ? Walk 10 feet activity did not occur: Safety/medical concerns (required use of AD) ? ?  ?   ? ?Walk 50 feet activity ? ? ?Assist Walk 50 feet with 2 turns activity did not occur: Safety/medical concerns (required use of AD) ? ?  ?   ? ? ?Walk 150 feet activity ? ? ?Assist Walk 150 feet activity did not occur: Safety/medical concerns ? ?  ?  ?  ? ?Walk 10 feet on uneven surface  ?activity ? ? ?Assist Walk 10 feet on uneven surfaces activity did not occur: Safety/medical concerns ? ? ?  ?   ? ?Wheelchair ? ? ? ? ?Assist Is the patient using a wheelchair?: Yes (used for transport to/from gym) ?Type of Wheelchair: Manual ?  ? ?Wheelchair assist level: Dependent - Patient 0% ?   ? ? ?Wheelchair 50 feet with 2 turns activity ? ? ? ?Assist ? ?  ?  ? ? ?Assist Level: Dependent - Patient 0%  ? ?Wheelchair 150 feet activity  ? ? ? ?Assist ?    ? ? ?Assist Level: Dependent - Patient 0%  ? ?Blood pressure (!) 159/119, pulse 69, temperature 98.8 ?F (37.1 ?C), temperature source Oral, resp. rate 16, height 6' (1.829 m), weight 75.4 kg, SpO2 98 %. ? ?1. 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            -patient may shower ?            -ELOS/Goals: 7-10 days ?            -Admit to CIR PT, OT, SLP evals, team conf in am discussed with wife  ?2.  Antithrombotics: ?-DVT/anticoagulation:  Pharmaceutical: Lovenox ?            -antiplatelet therapy: Aspirin 81 mg daily and Plavix 75 mg daily x3 weeks then Plavix alone ?3. Pain Management: Tylenol as needed ?4. Mood: Melatonin 1.5 mg nightly provide emotional support ?            -antipsychotic agents: N/A ?5. Neuropsych: This patient is capable of making decisions on his own behalf. ?6. Skin/Wound Care: Routine skin checks ?7. Fluids/Electrolytes/Nutrition: Routine in and outs with follow-up chemistries ?8.  Type 2 diabetes mellitus.  Hemoglobin A1c 7.7.  Jardiance 25 mg daily as well as SSI.  Actos 50 mg daily on hold resume as  needed ?CBG (last 3)  ?Recent Labs  ?  05/27/21 ?1629 05/27/21 ?2158 05/28/21 ?0648  ?GLUCAP 197* 210* 187*  ? ?CBG ok , cont current regimen , may need to increase if appetite increases , add ensure  ? ?9.  Hypertension.  Well controlled, continue Cozaar 50 mg daily, Cardura 4 mg daily ?Vitals:  ? 05/27/21 2202 05/28/21 1610  ?BP: 136/66 (!) 159/119  ?Pulse: 66 69  ?Resp: 16 16  ?Temp: 98.6 ?F (37 ?C) 98.8 ?F (37.1 ?C)  ?SpO2: 98% 98%  ?Cardura changed to '2mg'$  Q 12 due to lability now elevated may need to make further adjustments ? ?10.  Hyperlipidemia.  Continue Lipitor 40 mg daily, Zetia 10 mg daily ?11.  History of gout.  Continue Zyloprim 300 mg daily.  Monitor for any gout  flareups ?12.  Hypothyroidism.  Continue Synthroid ?  ? ?LOS: ?3 days ?A FACE TO FACE EVALUATION  WAS PERFORMED ? ?Luanna Salk Dejanira Pamintuan ?05/28/2021, 8:49 AM  ? ? ? ?

## 2021-05-28 NOTE — Progress Notes (Signed)
Orthopedic Tech Progress Note ?Patient Details:  ?Alan Rubio ?11/07/1931 ?[id number] ? ?Ortho Devices ?Type of Ortho Device: Abdominal binder ?Ortho Device/Splint Interventions: Ordered ?  ?  ? ?Alan Rubio ?05/28/2021, 9:11 AM ? ?

## 2021-05-28 NOTE — Progress Notes (Addendum)
Occupational Therapy Session Note ? ?Patient Details  ?Name: Alan Rubio ?MRN: [id number] ?Date of Birth: 1931-12-01 ? ?Today's Date: 05/28/2021 ?OT Individual Time: AM:  4818-5631  OT Individual Time:  AM:  58 min ?PM:  4970-2637 ?OT Individual Time Calculation (min): 44 min  ? ? ?Short Term Goals: ?Week 1:  OT Short Term Goal 1 (Week 1): Pt will complete sit<>stand using RW with min assist in prep for LB ADLs ?OT Short Term Goal 2 (Week 1): Pt will complete stand pivot <> BSC with mod assist in prep for toileting ?OT Short Term Goal 3 (Week 1): Pt will donn/doff pull up and pants with min assist. ?OT Short Term Goal 4 (Week 1): Pt will bathe LB at sit<>stand level using LHS and shower bench as needed with min assist. ? ?Skilled Therapeutic Interventions/Progress Updates:  ?  AM Session:  ADL:  Patient received supine in bed, sleeping soundly - slow to arouse.  Patient without hearing aides initially - unable to place own hearing aides in ears.  Patient pleasant - reports neck is stiff and sore - difficulty tipping head to look upward.  Patient assisted to roll and to sit at edge of bed.  Patient Initially with strong posterior lean at edge of bed, but as he shifted forward so feet were on floor - able to maintain unsupported sitting.  Patient reports inability to reach downward toward feet, but with coaxing and gentle assistance to initiate - able to reach to lower leg.  Patient required increased time and occasional assistance for grasp with BUE.  Patient able to transition to standing with min assistance with use of walker in front.   ?Patient assisted to recliner as breakfast now delivered.  Patient needed mod/max assistance to put in dentures.  Chair alarm in place, and call bell within reach.   ?Nursing staff notified as patient requires supervision for meals.   ? ?PM Session:  Patient received from recliner.  Patient's son and wife present.  Patient continuing to report pain in neck.  RN Notified.  Patient  agreeable to attempting toileting.  Max assist to stand from recliner.  Once on feet assistance to weight shift mass forward over feet.  Patient able to walk with min assist initially, then mod assist when fatigued.  Did well with cues to direct directional changes.  Patient able to stand with supervision / min assist from higher surface (wheelchair versus recliner) Patient assisted back to bed  with bed alarm and call bell in reach - as still reporting neck pain and awaiting pain medication.  Discussed with family that patient stands better from elevated surface.   ? ?Therapy Documentation ?Precautions:  ?Precautions ?Precautions: Fall, Other (comment) ?Precaution Comments: HOH, orthostatic ?Restrictions ?Weight Bearing Restrictions: No ? ? ?Pain: ?Pain Assessment ?Pain Scale: 0-10 ?Pain Score: 0-No pain ?Pain Type: Acute pain ?Pain Location: Neck ?Pain Orientation: Posterior ?Pain Descriptors / Indicators: Aching ?Pain Frequency: Rarely ?Pain Onset: Gradual ?Patients Stated Pain Goal: 0 ?Pain Intervention(s): Medication (See eMAR)Reported pain in neck - unable to score.   ? ?Therapy/Group: Individual Therapy ? ?Mariah Milling ?05/28/2021, 3:06 PM ?

## 2021-05-28 NOTE — Progress Notes (Signed)
Physical Therapy Session Note ? ?Patient Details  ?Name: Alan Rubio ?MRN: [id number] ?Date of Birth: [birth year] ? ?Today's Date: 05/28/2021 ?PT Individual Time: 7824-2353 and 0920-1002 ?PT Individual Time Calculation (min): 15 min  and 42 min ?And ?Today's Date: 05/28/2021  ?PT Missed Time: 18 Minutes ?Missed Time Reason: Other (Comment) (meal) ? ?Short Term Goals: ?Week 1:  PT Short Term Goal 1 (Week 1): Pt will perform supine<>sit with min assist ?PT Short Term Goal 2 (Week 1): Pt will perform sit<>stands using LRAD with min assist ?PT Short Term Goal 3 (Week 1): Pt will perform bed<>chair transfers using LRAD with min assist ?PT Short Term Goal 4 (Week 1): Pt will ambulate at least 36f using LRAD with CGA ?PT Short Term Goal 5 (Week 1): Pt will participate in standardized outcome measure assessing his balance ? ?Skilled Therapeutic Interventions/Progress Updates:  ?  Pt received sitting in recliner eating breakfast with family present and nurse exiting upon therapist arrival. Nurse updated that pt is still having elevated BPs at night - we are still awaiting an abdominal binder be delivered to the pt's room. Pt already wearing thigh high TED hose. Pt's family with questions regarding pt's BP and therapist only able to educate them within PT scope of practice and educated on use of TEDs and abdominal binder during therapy as well as monitoring vitals with activity. Pt's son informs that pt would become "really tired" with decline in his mobility any time he would get up and move around in the house with pt's son reporting he believes pt's BP may have been dropping at home too. Therapist educated on importance of monitoring pt's BP at home after D/C - family confirmed they have automatic BP cuff. Pt requiring additional time to complete meal therefore left sitting in recliner with needs in reach, seat belt alarm on, family present, and nurse tech present to provide full supervision during meal. Missed 18 minutes of  skilled physical therapy total. Therapist will make-up missed time as scheduling allows. ? ? ? ?Pt finished meal therefore therapist returned to continue with session. Pt received sitting in recliner with his family still present. Pt still wearing thigh high TED hose and abdominal binder had been delivered to pt's room. ? ?Assessed vitals in sitting to start session: BP 134/67 (MAP 86), HR 77bpm  ?Donned abdominal binder. ? ?Sit>stand recliner>RW with heavy mod assist for lifting to stand at this time - continues to require cuing to push up from armrests - cuing for increased anterior trunk lean/weight shift while rising - continues to have slow rise with posterior bias and delayed in trunk/hip/knee extension to achieve upright. ? ?Short distance ~710fambulatory transfer to w/c using RW with CGA/light min assist for balance - continues to demo excessive anterior trunk and hip flexion resulting in crouched posture with narrow BOS, very short, shuffled step lengths. ? ?Reassessed vitals: BP 134/66 (MAP 82), HR 88bpm  ? ?Gait training ~5048fsing RW with light min assist as just described with +2 w/c follow for safety. Continues to have slow gait speed with excessive trunk/hip flexion, downward gaze, narrow BOS and very short, shuffled steps - verbal and tactile cuing for improvement throughout.  ? ?Seated rest break with pt reporting lightheadedness as 7/10 with "feeling weak" overall. Reassessed vitals in sitting: BP 136/68 (MAP 88), HR 91bpm  ? ?Transported to/from gym in w/c for time management and energy conservation. ? ?Gait additional 39f73fing RW with light min assist as described above. ? ?Repeated  sit<>stands to/from elevated EOM x3 reps targeting functional B LE strengthening and increased anterior weight shift while rising - light min assist from this height. ?R stand pivot to w/c using RW with CGA/light min assist for steadying. ? ?Reasssessed vitals: BP 115/73 (MAP 87), HR 90bpm  ? ?Transported back to  room in w/c. ? ?Stand pivot to recliner using RW with light min assist - continued cuing to step all the way back to seat and reach back for armrest to ensure safety. ? ?Reclined in recliner with B LEs elevated. ?Reassessed vitals: BP 129/73 (MAP 88), HR 85bpm ? ?Pt left seated in recliner with needs in reach, seat belt alarm on, B LEs elevated, family present, and abdominal binder and TED hose still in place.   ? ? ? ?Therapy Documentation ?Precautions:  ?Precautions ?Precautions: Fall, Other (comment) ?Precaution Comments: HOH, orthostatic ?Restrictions ?Weight Bearing Restrictions: No ? ? ?Pain: ?  No reports of pain throughout session. ? ? ? ?Therapy/Group: Individual Therapy ? ?Tawana Scale , PT, DPT, NCS, CSRS ?05/28/2021, 7:54 AM  ?

## 2021-05-29 LAB — GLUCOSE, CAPILLARY
Glucose-Capillary: 151 mg/dL — ABNORMAL HIGH (ref 70–99)
Glucose-Capillary: 176 mg/dL — ABNORMAL HIGH (ref 70–99)
Glucose-Capillary: 296 mg/dL — ABNORMAL HIGH (ref 70–99)
Glucose-Capillary: 244 mg/dL — ABNORMAL HIGH (ref 70–99)

## 2021-05-29 MED ORDER — NYSTATIN 100000 UNIT/GM EX POWD
Freq: Two times a day (BID) | CUTANEOUS | Status: DC
  Filled 2021-05-29: qty 15

## 2021-05-29 MED ORDER — PIOGLITAZONE HCL 15 MG PO TABS
15.0000 mg | ORAL_TABLET | Freq: Every day | ORAL | Status: DC
  Administered 2021-05-29 – 2021-06-01 (×4): 15 mg via ORAL
  Filled 2021-05-29 (×5): qty 1

## 2021-05-29 NOTE — Progress Notes (Signed)
Physical Therapy Session Note ? ?Patient Details  ?Name: Alan Rubio ?MRN: [id number] ?Date of Birth: 01-23-1932 ? ?Today's Date: 05/29/2021 ?PT Individual Time: 8921-1941 ?PT Individual Time Calculation (min): 53 min  ? ?Short Term Goals: ?Week 1:  PT Short Term Goal 1 (Week 1): Pt will perform supine<>sit with min assist ?PT Short Term Goal 2 (Week 1): Pt will perform sit<>stands using LRAD with min assist ?PT Short Term Goal 3 (Week 1): Pt will perform bed<>chair transfers using LRAD with min assist ?PT Short Term Goal 4 (Week 1): Pt will ambulate at least 50ft using LRAD with CGA ?PT Short Term Goal 5 (Week 1): Pt will participate in standardized outcome measure assessing his balance ? ?Skilled Therapeutic Interventions/Progress Updates: Pt presented in bed with nsg present administering am meds agreeable to therapy. Pt c/o pain in neck 5/10, nsg aware and pain meds administered. Pt noted to be eating breakfast upon entry therefore PTA provided supervision once nsg left to allow pt to complete breakfast. During meal pt realized that hearing aids were not in correct ears, pt was able to remove but was unable to place in correct ear therefore PTA assisted. Once completed with meal pt indicating urgent need for urinary void. Pt then had immediate incontinent void. PTA donned TED hose, socks and abdominal binder total A.  Pt performed supine to sit with minA and heavy use of bed features with increased time. Pt required modA for anterior scoot to EOB. Performed Sit to stand with RW and minA with multimodal cues for hand placement. In standing pt performed peri-care total A and pulled fresh brief over hips. Pt returned to sitting and PTA threaded pants total A. Pt performed stand in same manner as prior and PTA pulled pants over hips. Pt then performed sit to supine with modA, pt was able to initiate lifting LLE onto bed but required assist for RLE as well as required cues for sequencing. With verbal cues pt was able to  perform lateral scoot to reposition in bed. Pt left in bed at end of session with bed alarm on, call bell within reach and needs met.  ?   ? ?Therapy Documentation ?Precautions:  ?Precautions ?Precautions: Fall, Other (comment) ?Precaution Comments: HOH, orthostatic ?Restrictions ?Weight Bearing Restrictions: No ?General: ?  ?Vital Signs: ?  ?Pain: ?  ?Mobility: ?  ?Locomotion : ?   ?Trunk/Postural Assessment : ?   ?Balance: ?  ?Exercises: ?  ?Other Treatments:   ? ? ? ?Therapy/Group: Individual Therapy ? ?[name] ?05/29/2021, 12:36 PM  ?

## 2021-05-29 NOTE — Progress Notes (Signed)
?                                                       PROGRESS NOTE ? ? ?Subjective/Complaints: ? ?Pt asks who I am but realizes after reorientation  ?Mildly elevated BUN  ?ROS- neg CP, SOB, N/V/D ? ?Objective: ?  ?No results found. ?No results for input(s): WBC, HGB, HCT, PLT in the last 72 hours. ? ?Recent Labs  ?  05/28/21 ?1033  ?NA 139  ?K 3.9  ?CL 104  ?CO2 26  ?GLUCOSE 197*  ?BUN 30*  ?CREATININE 0.97  ?CALCIUM 10.0  ? ? ? ?Intake/Output Summary (Last 24 hours) at 05/29/2021 0841 ?Last data filed at 05/28/2021 1834 ?Gross per 24 hour  ?Intake 600 ml  ?Output --  ?Net 600 ml  ? ?  ? ?  ? ?Physical Exam: ?Vital Signs ?Blood pressure 138/70, pulse 64, temperature 98.8 ?F (37.1 ?C), temperature source Oral, resp. rate 14, height 6' (1.829 m), weight 75.4 kg, SpO2 98 %. ? ? ?General: No acute distress ?Mood and affect are appropriate ?Heart: Regular rate and rhythm no rubs murmurs or extra sounds ?Lungs: Clear to auscultation, breathing unlabored, no rales or wheezes ?Abdomen: Positive bowel sounds, soft nontender to palpation, nondistended ?Extremities: No clubbing, cyanosis, or edema ?Skin: No evidence of breakdown, no evidence of rash ?Neurologic: Cranial nerves II through XII intact, motor strength is 4/5 in bilateral deltoid, bicep, tricep, grip, Left hip flexor, knee extensors, ankle dorsiflexor and plantar flexor ?3- RIgh tHF, KE, ADF, 4- Left HF, KE, ADF ?Sensory exam normal sensation to light touch  in bilateral upper and lower extremities ? ?Musculoskeletal: Full range of motion in all 4 extremities. No joint swelling ? ? ?Assessment/Plan: ?1. Functional deficits which require 3+ hours per day of interdisciplinary therapy in a comprehensive inpatient rehab setting. ?Physiatrist is providing close team supervision and 24 hour management of active medical problems listed below. ?Physiatrist and rehab team continue to assess barriers to discharge/monitor patient progress toward functional and medical  goals ? ?Care Tool: ? ?Bathing ?   ?Body parts bathed by patient: Right arm  ? Body parts bathed by helper: Right lower leg ?  ?  ?Bathing assist Assist Level: Minimal Assistance - Patient > 75% ?  ?  ?Upper Body Dressing/Undressing ?Upper body dressing   ?What is the patient wearing?: Pull over shirt ?   ?Upper body assist Assist Level: Minimal Assistance - Patient > 75% ?   ?Lower Body Dressing/Undressing ?Lower body dressing ? ? ?   ?What is the patient wearing?: Incontinence brief ? ?  ? ?Lower body assist Assist for lower body dressing: Moderate Assistance - Patient 50 - 74% ?   ? ?Toileting ?Toileting    ?Toileting assist Assist for toileting: Moderate Assistance - Patient 50 - 74% ?  ?  ?Transfers ?Chair/bed transfer ? ?Transfers assist ?   ? ?Chair/bed transfer assist level: Moderate Assistance - Patient 50 - 74% ?Chair/bed transfer assistive device: Armrests, Walker ?  ?Locomotion ?Ambulation ? ? ?Ambulation assist ? ?   ? ?Assist level: Minimal Assistance - Patient > 75% ?Assistive device: Walker-rolling ?Max distance: 11f  ? ?Walk 10 feet activity ? ? ?Assist ? Walk 10 feet activity did not occur: Safety/medical concerns (required use of AD) ? ?  ?   ? ?  Walk 50 feet activity ? ? ?Assist Walk 50 feet with 2 turns activity did not occur: Safety/medical concerns (required use of AD) ? ?  ?   ? ? ?Walk 150 feet activity ? ? ?Assist Walk 150 feet activity did not occur: Safety/medical concerns ? ?  ?  ?  ? ?Walk 10 feet on uneven surface  ?activity ? ? ?Assist Walk 10 feet on uneven surfaces activity did not occur: Safety/medical concerns ? ? ?  ?   ? ?Wheelchair ? ? ? ? ?Assist Is the patient using a wheelchair?: Yes (used for transport to/from gym) ?Type of Wheelchair: Manual ?  ? ?Wheelchair assist level: Dependent - Patient 0% ?   ? ? ?Wheelchair 50 feet with 2 turns activity ? ? ? ?Assist ? ?  ?  ? ? ?Assist Level: Dependent - Patient 0%  ? ?Wheelchair 150 feet activity  ? ? ? ?Assist ?   ? ? ?Assist  Level: Dependent - Patient 0%  ? ?Blood pressure 138/70, pulse 64, temperature 98.8 ?F (37.1 ?C), temperature source Oral, resp. rate 14, height 6' (1.829 m), weight 75.4 kg, SpO2 98 %. ? ?1. 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            -patient may shower ?            -ELOS/Goals: 7-10 days ?            -Cont CIR PT, OT, SLP evals,  ?2.  Antithrombotics: ?-DVT/anticoagulation:  Pharmaceutical: Lovenox ?            -antiplatelet therapy: Aspirin 81 mg daily and Plavix 75 mg daily x3 weeks then Plavix alone ?3. Pain Management: Tylenol as needed ?4. Mood: Melatonin 1.5 mg nightly provide emotional support ?            -antipsychotic agents: N/A ?5. Neuropsych: This patient is capable of making decisions on his own behalf. ?6. Skin/Wound Care: Routine skin checks ?7. Fluids/Electrolytes/Nutrition: Routine in and outs with follow-up chemistries ?8.  Type 2 diabetes mellitus.  Hemoglobin A1c 7.7.  Jardiance 25 mg daily as well as SSI.  Actos 15 mg daily on hold resume today 5/5 ?CBG (last 3)  ?Recent Labs  ?  05/28/21 ?1642 05/28/21 ?2030 05/29/21 ?7903  ?GLUCAP 213* 223* 151*  ? ? ? ? ?9.  Hypertension.  Well controlled, continue Cozaar 50 mg daily, Cardura 4 mg daily ?Vitals:  ? 05/28/21 1942 05/29/21 0300  ?BP: (!) 146/62 138/70  ?Pulse: 70 64  ?Resp: 15 14  ?Temp: 97.8 ?F (36.6 ?C) 98.8 ?F (37.1 ?C)  ?SpO2: 98% 98%  ?Cardura changed to '2mg'$  Q 12 due to lability improving 5/5 ?10.  Hyperlipidemia.  Continue Lipitor 40 mg daily, Zetia 10 mg daily ?11.  History of gout.  Continue Zyloprim 300 mg daily.  Monitor for any gout flareups ?12.  Hypothyroidism.  Continue Synthroid ?  ? ?LOS: ?4 days ?A FACE TO FACE EVALUATION WAS PERFORMED ? ?Luanna Salk Codi Folkerts ?05/29/2021, 8:41 AM  ? ? ? ?

## 2021-05-29 NOTE — Progress Notes (Signed)
Occupational Therapy Note ? ?Patient Details  ?Name: Alan Rubio ?MRN: [id number] ?Date of Birth: [birth year] ? ?Today's Date: 05/29/2021 ?OT Missed Time: 45 Minutes ?Missed Time Reason: Patient fatigue ? ?Pt just transported from 5C06 to 4M09 and sleeping upon OT arrival.  Son and wife present.  OT provided pt with new therapy schedule due to current one misplaced during move.  Pt appearing very lethargic and stating he doesn't want to do anything right now and he wants to rest.  He also reports he has not been sleeping much at night.  Call bell in reach, bed alarm on.    ? ?[name] ?05/29/2021, 3:03 PM ?

## 2021-05-29 NOTE — Progress Notes (Signed)
Speech Language Pathology Daily Session Note ? ?Patient Details  ?Name: Alan Rubio ?MRN: [id number] ?Date of Birth: 04-08-1931 ? ?Today's Date: 05/29/2021 ?SLP Individual Time: 4503-8882 ?SLP Individual Time Calculation (min): 60 min ? ?Short Term Goals: ?Week 1: SLP Short Term Goal 1 (Week 1): Patient will consume current diet with minimal overt s/s of aspiration and min A cues for safe swallowing precautions/strategies ?SLP Short Term Goal 2 (Week 1): Patient will orient x4 with min A for use of external aids ?SLP Short Term Goal 3 (Week 1): Patient will complete basic-to-mildly complex problem solving tasks with mod A verbal/visual cues ?SLP Short Term Goal 4 (Week 1): Patient will utilize external memory aids to increase functional recall with mod A verbal/visual cues ?SLP Short Term Goal 5 (Week 1): Patient will implement speech intelligiblity strategies at the phrase/sentence level with mod A verbal cues to reach 75% intelligiblity ? ?Skilled Therapeutic Interventions: Skilled ST treatment focused on cognitive-communication and speech goals. Pt seen upright in wheelchair today in speech therapy treatment room. Pt perceived as more alert and participative than previous session in pt's room/bed. SLP facilitated orientation with min A cues and additional processing time to orient to day of week and situation.  ? ?SLP facilitated further language testing with the Western Aphasia Battery Bedside (WAB-B). Scores are as followed: Spontaneous speech content 10/10, fluency 9/10; auditory verbal comprehension 9/10; verbal repetition 10/10; object naming 10/10 with no hesitations, word finding difficulty, or evidence of paraphasic errors. Based on performance and further informal measures, expressive/receptive language skills appear grossly intact. Pt remains affected by decreased speech intelligibility at the word level secondary to articulatory imprecision, decreased breath support, and hoarse vocal quality. Pt unable to  recall speech strategies; SLP re-educated and provided pt with handout for room. Reviewed results of language assessment with pt and family, as well as educated on speech strategies handout.  ? ?SLP facilitated testing for respiratory muscle training (RMT) in effort to enhance breath support for speech and vocal intensity. Average sustained phonation (ah) was 5 seconds. Pt unable to effectively perform s:z ratio due to decreased understanding of task. Per RMT testing, pt presents deficient in both inspiratory and expiratory thresholds. It should be noted pt exhibited difficulty obtaining labial seal for expiratory measures thus numbers were likely inaccurate. MIT=22; MEP=11. SLP selected inspiratory muscle training (IMT) device with intensity set to 21. Pt performed 15 repetitions (3 sets of 5 repetitions) with mod-to-max A verbal and demonstration cues for "quick" strong breaths vs. long and sustained. Pt perceived level of effort as 5/10. Placed IMT device in patient's top drawer on nightstand in addition to instructions on use. At this time, plan to address RMT exercises with SLP only. Patient was left in wheelchair with alarm activated and immediate needs within reach at end of session. Continue per current plan of care.   ?   ?Pain ?Pain Assessment ?Pain Scale: 0-10 ?Pain Score: 0-No pain ? ?Therapy/Group: Individual Therapy ? ?Ronnica Dreese T Naseem Adler ?05/29/2021, 4:54 PM ?

## 2021-05-29 NOTE — Progress Notes (Addendum)
Patient ID: Alan Rubio, male   DOB: 03-06-1931, 88 y.o.   MRN: [id number] ?Met with the patient to review rehab process, team conference and plan of care. Reviewed  secondary risks including DM, (A1C 7.7), HTN, HLD (LDL106/Trig 49), A-fib (DAPT x 3 wks then Plavix solo) per neurology and previous CVA. Patient reported his goal is to be able to get up out of bed by himself. Reported son will help with meds and wife manages cooking. Reviewed poor appetite;discussed food options to increase protein and low purine diet for gout. Also gave patient information on setting up a My chart Account with access code. Continue to follow along to discharge to address educational needs to facilitate preparation for discharge. Dorien Chihuahua B ? ?

## 2021-05-29 NOTE — Progress Notes (Signed)
Redness noted to penis and scrotum. Skin intact. Denies pain or discomfort. Notified on call- Luetta Nutting, NP. ?

## 2021-05-29 NOTE — Progress Notes (Signed)
Occupational Therapy Session Note ? ?Patient Details  ?Name: Alan Rubio ?MRN: [id number] ?Date of Birth: 04/19/1931 ? ?Today's Date: 05/29/2021 ?OT Individual Time: 2952-8413 ?OT Individual Time Calculation (min): 45 min  ? ? ?Short Term Goals: ?Week 1:  OT Short Term Goal 1 (Week 1): Pt will complete sit<>stand using RW with min assist in prep for LB ADLs ?OT Short Term Goal 2 (Week 1): Pt will complete stand pivot <> BSC with mod assist in prep for toileting ?OT Short Term Goal 3 (Week 1): Pt will donn/doff pull up and pants with min assist. ?OT Short Term Goal 4 (Week 1): Pt will bathe LB at sit<>stand level using LHS and shower bench as needed with min assist. ?Week 2:    ? ?Skilled Therapeutic Interventions/Progress Updates:  ?  1:1 Pt received in bed. Focus of session on functional transfers with RW and donning socks and shoes. Pt able to get to EOB with bed rail (with HOB elevated) with min A to help translate right hip forward. Pt participated in doffing yellow socks and donning shoes with min A with more than reasonable amt of time. Pt transferred stand pivot with RW from bed to w/c with mod A. Pt with difficulty with forward weight shift. Pt taken to the gym and ambulated 5 feet with RW with min A with extra time.  Practiced sit to stands from the mat- starting with an elevated surface and gradually lowering to 21 in. Above 21 inch surface pt was able to to come into standing with UE support on RW with contact guard but at 21 inch pt required mod A to come up into standing.  ? ?Also in standing focus on ability to move right foot forward and back without dragging it - in prep for transfers.  ? ?Therapy Documentation ?Precautions:  ?Precautions ?Precautions: Fall, Other (comment) ?Precaution Comments: HOH, orthostatic ?Restrictions ?Weight Bearing Restrictions: No ?  ?Pain: ? Mild c/o neck pain able to continue ? ?Therapy/Group: Individual Therapy ? ?Nicoletta Ba ?05/29/2021, 3:36 PM ?

## 2021-05-30 DIAGNOSIS — E119 Type 2 diabetes mellitus without complications: Secondary | ICD-10-CM

## 2021-05-30 DIAGNOSIS — I10 Essential (primary) hypertension: Secondary | ICD-10-CM

## 2021-05-30 LAB — URINALYSIS, ROUTINE W REFLEX MICROSCOPIC
Bacteria, UA: NONE SEEN
Bilirubin Urine: NEGATIVE
Glucose, UA: >=500 mg/dL — AB
Hgb urine dipstick: NEGATIVE
Ketones, ur: NEGATIVE mg/dL
Leukocytes,Ua: NEGATIVE
Nitrite: NEGATIVE
Protein, ur: NEGATIVE mg/dL
Specific Gravity, Urine: 1.031 — ABNORMAL HIGH (ref 1.005–1.030)
pH: 7 (ref 5.0–8.0)

## 2021-05-30 LAB — BASIC METABOLIC PANEL
Anion gap: 10 (ref 5–15)
BUN: 28 mg/dL — ABNORMAL HIGH (ref 8–23)
CO2: 22 mmol/L (ref 22–32)
Calcium: 10.1 mg/dL (ref 8.9–10.3)
Chloride: 108 mmol/L (ref 98–111)
Creatinine, Ser: 0.83 mg/dL (ref 0.61–1.24)
GFR, Estimated: >60 mL/min (ref >60)
Glucose, Bld: 217 mg/dL — ABNORMAL HIGH (ref 70–99)
Potassium: 4.3 mmol/L (ref 3.5–5.1)
Sodium: 140 mmol/L (ref 135–145)

## 2021-05-30 LAB — GLUCOSE, CAPILLARY
Glucose-Capillary: 169 mg/dL — ABNORMAL HIGH (ref 70–99)
Glucose-Capillary: 231 mg/dL — ABNORMAL HIGH (ref 70–99)
Glucose-Capillary: 149 mg/dL — ABNORMAL HIGH (ref 70–99)
Glucose-Capillary: 217 mg/dL — ABNORMAL HIGH (ref 70–99)

## 2021-05-30 LAB — CBC
HCT: 40.9 % (ref 39.0–52.0)
Hemoglobin: 13.6 g/dL (ref 13.0–17.0)
MCH: 31.3 pg (ref 26.0–34.0)
MCHC: 33.3 g/dL (ref 30.0–36.0)
MCV: 94.2 fL (ref 80.0–100.0)
Platelets: 252 10*3/uL (ref 150–400)
RBC: 4.34 MIL/uL (ref 4.22–5.81)
RDW: 13.2 % (ref 11.5–15.5)
WBC: 6.8 10*3/uL (ref 4.0–10.5)
nRBC: 0 % (ref 0.0–0.2)

## 2021-05-30 MED ORDER — SODIUM CHLORIDE 0.45 % IV SOLN: INTRAVENOUS | Status: DC

## 2021-05-30 NOTE — Progress Notes (Signed)
Notified on call provide about patient change in status. Verbal order for labs ?

## 2021-05-30 NOTE — Progress Notes (Signed)
Speech Language Pathology Daily Session Note ? ?Patient Details  ?Name: Alan Rubio ?MRN: [id number] ?Date of Birth: 1931-07-07 ? ?Today's Date: 05/30/2021 ?SLP Individual Time: 7591-6384 ?SLP Individual Time Calculation (min): 55 min ? ?Short Term Goals: ?Week 1: SLP Short Term Goal 1 (Week 1): Patient will consume current diet with minimal overt s/s of aspiration and min A cues for safe swallowing precautions/strategies ?SLP Short Term Goal 2 (Week 1): Patient will orient x4 with min A for use of external aids ?SLP Short Term Goal 3 (Week 1): Patient will complete basic-to-mildly complex problem solving tasks with mod A verbal/visual cues ?SLP Short Term Goal 4 (Week 1): Patient will utilize external memory aids to increase functional recall with mod A verbal/visual cues ?SLP Short Term Goal 5 (Week 1): Patient will implement speech intelligiblity strategies at the phrase/sentence level with mod A verbal cues to reach 75% intelligiblity ? ?Skilled Therapeutic Interventions:Skilled ST services focused on swallow and speech skills. SLP applied bilateral hearing aids. Pt was orientated to place, but required min A fade to supervision A for situation/time after SLP posted signs in. SLP facilitated PO intake of dys 2 and dys 1 textures and thin liquids via cup/straw. Pt demonstrated increased bolus prep and delay in swallow initiation with dys 2 textures 15+ seconds and dys 1 textures 15 seconds with very mild oral residue. Pt required total A for feeding. Pt demonstrated piecemeal and timely swallow with thin liquids via cup and straw, however demonstrated immediate cough on 75% of opportunities. Cough was reduced with cues for small sips, however they still occurred on 60% off opportunities. SLP provided nectar thick liquids via straw, pt demonstrated no overt s/s aspiration. SLP downgraded to nectar thick liquids till a MBS can be completed or f/u ST services note tolerance of thin liquids trial. SLP educated pt and  nursing staff pertaining to downgrade and pt agreed. Pt was left in room with call bell within reach and bed/chair alarm set. SLP recommends to continue skilled services. ?   ? ?Pain ?Pain Assessment ?Pain Scale: 0-10 ?Pain Score: 0-No pain ?Faces Pain Scale: Hurts a little bit ?Pain Type: Acute pain ?Pain Location: Neck ?Pain Intervention(s): Medication (See eMAR) ?PAINAD (Pain Assessment in Advanced Dementia) ?Breathing: normal ?Negative Vocalization: none ?Consolability: no need to console ? ?Therapy/Group: Individual Therapy ? ?Alan Rubio ?05/30/2021, 12:49 PM ?

## 2021-05-30 NOTE — Progress Notes (Signed)
Pt really never perked up today after I saw him. He is afebrile. Had small vessel stroke. UA and CBC negative. UCX pending. BUN remains elevated comparatively to 5/2. PO intake poor today. Begin IVF 1/2ns 75cc/hr. Recheck BMET in am.  ? ?Meredith Staggers, MD, FAAPMR ?[facility] ?Medical Director Rehabilitation Services ?05/30/2021  ?

## 2021-05-30 NOTE — Progress Notes (Signed)
Physical Therapy Session Note ? ?Patient Details  ?Name: Alan Rubio ?MRN: [id number] ?Date of Birth: 05/07/1931 ? ?Today's Date: 05/30/2021 ?PT Individual Time: 2671-2458 ?PT Individual Time Calculation (min): 38 min  ?Missed time: 37 minutes ? ?Short Term Goals: ?Week 1:  PT Short Term Goal 1 (Week 1): Pt will perform supine<>sit with min assist ?PT Short Term Goal 2 (Week 1): Pt will perform sit<>stands using LRAD with min assist ?PT Short Term Goal 3 (Week 1): Pt will perform bed<>chair transfers using LRAD with min assist ?PT Short Term Goal 4 (Week 1): Pt will ambulate at least 66f using LRAD with CGA ?PT Short Term Goal 5 (Week 1): Pt will participate in standardized outcome measure assessing his balance ? ?Skilled Therapeutic Interventions/Progress Updates:  ?Pt asleep in bed on arrival. Aroused briefly to acknowledge PTA and agree to PT. Then closing eyes again. BP at start of session 144/59 (84) HR 70. ? ?Exercises for strengthening of bil LE's: ankle pumps with over pressure for heel cord stretching/manual resistance to pt pushing into PF, quad sets with 5 second holds, heel slides, straight leg raises, hip abd/add and short arc quads for 10 reps each active to active assist with each.  BP after ex's 136/64 (85) HR 69. ? ?Max to total assist for transition from supine in bed to sitting edge of bed with HOB 40 degrees and pad under pt used to facilitate hip/pelvic movements toward edge of bed. Min assist initially at edge of bed progressing to min guard/supervision. BP after transfer to edge of bed 131/71 (89) HR 75 ? ?While seated edge of bed working on sitting balance/core stability- forward/backward rocking out of base of support with pt holding PTA with both hands on PTA forearms for 10 reps with min assist/facilitation. Then lateral elbow taps to bed surface<>back to short sitting at edge of bed for 5 reps each side, min to mod assist, left>right side. Pt then nodding back to sleep at edge of bed. Lateral  [facility] max/total assist. ? ?Pt returned to supine with total assist. Pt's BP once back in supine 163/73 (99), HR 75. Pt left with HOB 35 degrees, needs in reach. Pt back to sleeping. RN made aware of session and reports she has ordered some labs due to change in alertness/status.  ? ? ? ?Therapy Documentation ?Precautions:  ?Precautions ?Precautions: Fall, Other (comment) ?Precaution Comments: HOH, orthostatic ?Restrictions ?Weight Bearing Restrictions: No ?General: ?PT Amount of Missed Time (min): 37 Minutes ?PT Missed Treatment Reason: Patient fatigue ?Vital Signs: ?Therapy Vitals ?Pulse Rate: 74 ?BP: (!) 171/91 ?Patient Position (if appropriate): Sitting ?Oxygen Therapy ?SpO2: 98 % ?Pain: ?Pain Assessment ?Pain Score: 0-No pain ?Faces Pain Scale: Hurts a little bit ?PAINAD (Pain Assessment in Advanced Dementia) ?Breathing: normal ?Negative Vocalization: none ?Consolability: no need to console ? ? ? ? ?Therapy/Group: Individual Therapy ? ?KWillow Ora PTA, CLT ?[place]?05/30/21, 2:08 PM   ?

## 2021-05-30 NOTE — Progress Notes (Signed)
?                                                       PROGRESS NOTE ? ? ?Subjective/Complaints: ? ?Patient with uneventful night.  Some redness in his penis and scrotum noted.  Nystatin placed.  Patient asleep when I entered ? ?ROS: Limited due to cognitive/behavioral  ? ? ? ?Objective: ?  ?No results found. ?No results for input(s): WBC, HGB, HCT, PLT in the last 72 hours. ? ?Recent Labs  ?  05/28/21 ?1033  ?NA 139  ?K 3.9  ?CL 104  ?CO2 26  ?GLUCOSE 197*  ?BUN 30*  ?CREATININE 0.97  ?CALCIUM 10.0  ? ? ?Intake/Output Summary (Last 24 hours) at 05/30/2021 0900 ?Last data filed at 05/29/2021 1851 ?Gross per 24 hour  ?Intake 600 ml  ?Output 200 ml  ?Net 400 ml  ?  ? ?  ? ?Physical Exam: ?Vital Signs ?Blood pressure (!) 186/75, pulse 68, temperature (!) 97.4 ?F (36.3 ?C), resp. rate 18, height 6' (1.829 m), weight 75.4 kg, SpO2 99 %. ? ? ?Constitutional: No distress . Vital signs reviewed. ?HEENT: NCAT, EOMI, oral membranes moist ?Neck: supple ?Cardiovascular: RRR without murmur. No JVD    ?Respiratory/Chest: CTA Bilaterally without wheezes or rales. Normal effort    ?GI/Abdomen: BS +, non-tender, non-distended ?Ext: no clubbing, cyanosis, or edema ?Psych: pleasant and cooperative  ?Skin: Redness around scrotum ?Neurologic: Opens eyes to commands.  Inconsistent otherwise.  Was not engaging in conversation this morning.  Cranial nerves II through XII intact, motor strength is 4/5 in bilateral deltoid, bicep, tricep, grip, Left hip flexor, knee extensors, ankle dorsiflexor and plantar flexor ?3- RIgh tHF, KE, ADF, 4- Left HF, KE, ADF ?Sensory exam normal sensation to light touch  in bilateral upper and lower extremities ? ?Musculoskeletal: Full range of motion in all 4 extremities. No joint swelling ? ? ?Assessment/Plan: ?1. Functional deficits which require 3+ hours per day of interdisciplinary therapy in a comprehensive inpatient rehab setting. ?Physiatrist is providing close team supervision and 24 hour management of  active medical problems listed below. ?Physiatrist and rehab team continue to assess barriers to discharge/monitor patient progress toward functional and medical goals ? ?Care Tool: ? ?Bathing ?   ?Body parts bathed by patient: Right arm  ? Body parts bathed by helper: Right lower leg ?  ?  ?Bathing assist Assist Level: Minimal Assistance - Patient > 75% ?  ?  ?Upper Body Dressing/Undressing ?Upper body dressing   ?What is the patient wearing?: Pull over shirt ?   ?Upper body assist Assist Level: Minimal Assistance - Patient > 75% ?   ?Lower Body Dressing/Undressing ?Lower body dressing ? ? ?   ?What is the patient wearing?: Incontinence brief ? ?  ? ?Lower body assist Assist for lower body dressing: Moderate Assistance - Patient 50 - 74% ?   ? ?Toileting ?Toileting    ?Toileting assist Assist for toileting: Moderate Assistance - Patient 50 - 74% ?  ?  ?Transfers ?Chair/bed transfer ? ?Transfers assist ?   ? ?Chair/bed transfer assist level: Moderate Assistance - Patient 50 - 74% ?Chair/bed transfer assistive device: Armrests, Walker ?  ?Locomotion ?Ambulation ? ? ?Ambulation assist ? ?   ? ?Assist level: Minimal Assistance - Patient > 75% ?Assistive device: Walker-rolling ?Max distance: 39f  ? ?  Walk 10 feet activity ? ? ?Assist ? Walk 10 feet activity did not occur: Safety/medical concerns (required use of AD) ? ?  ?   ? ?Walk 50 feet activity ? ? ?Assist Walk 50 feet with 2 turns activity did not occur: Safety/medical concerns (required use of AD) ? ?  ?   ? ? ?Walk 150 feet activity ? ? ?Assist Walk 150 feet activity did not occur: Safety/medical concerns ? ?  ?  ?  ? ?Walk 10 feet on uneven surface  ?activity ? ? ?Assist Walk 10 feet on uneven surfaces activity did not occur: Safety/medical concerns ? ? ?  ?   ? ?Wheelchair ? ? ? ? ?Assist Is the patient using a wheelchair?: Yes (used for transport to/from gym) ?Type of Wheelchair: Manual ?  ? ?Wheelchair assist level: Dependent - Patient 0% ?   ? ? ?Wheelchair  50 feet with 2 turns activity ? ? ? ?Assist ? ?  ?  ? ? ?Assist Level: Dependent - Patient 0%  ? ?Wheelchair 150 feet activity  ? ? ? ?Assist ?   ? ? ?Assist Level: Dependent - Patient 0%  ? ?Blood pressure (!) 186/75, pulse 68, temperature (!) 97.4 ?F (36.3 ?C), resp. rate 18, height 6' (1.829 m), weight 75.4 kg, SpO2 99 %. ? ?1. 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            -patient may shower ?            -ELOS/Goals: 7-10 days ?            -Continue CIR therapies including PT, OT, and SLP   ?2.  Antithrombotics: ?-DVT/anticoagulation:  Pharmaceutical: Lovenox ?            -antiplatelet therapy: Aspirin 81 mg daily and Plavix 75 mg daily x3 weeks then Plavix alone ?3. Pain Management: Tylenol as needed ?4. Mood: Melatonin 1.5 mg nightly provide emotional support ?            -antipsychotic agents: N/A ?5. Neuropsych: This patient is capable of making decisions on his own behalf. ?6. Skin/Wound Care: Routine skin checks ?7. Fluids/Electrolytes/Nutrition: Routine in and outs with follow-up chemistries ?8.  Type 2 diabetes mellitus.  Hemoglobin A1c 7.7.  Jardiance 25 mg daily as well as SSI.  Actos 15 mg daily on hold resumed 5/5 ?CBG (last 3)  ?Recent Labs  ?  05/29/21 ?1710 05/29/21 ?2105 05/30/21 ?0636  ?GLUCAP 296* 244* 169*  ? 5/6 Actos resumed yesterday.  Observe for response today ? ? ? ?9.  Hypertension.  Well controlled, continue Cozaar 50 mg daily, Cardura 4 mg daily ?Vitals:  ? 05/29/21 1951 05/30/21 0512  ?BP: (!) 145/72 (!) 186/75  ?Pulse: 73 68  ?Resp: 14 18  ?Temp: 98.2 ?F (36.8 ?C) (!) 97.4 ?F (36.3 ?C)  ?SpO2: 98% 99%  ?Cardura changed to '2mg'$  Q 12 due to lability   ?5/6 remains elevated at times.  Monitor for trend ?10.  Hyperlipidemia.  Continue Lipitor 40 mg daily, Zetia 10 mg  daily ?11.  History of gout.  Continue Zyloprim 300 mg daily.  Monitor for any gout flareups ?12.  Hypothyroidism.  Continue Synthroid ?  ? ?LOS: ?5 days ?A FACE TO FACE EVALUATION WAS PERFORMED ? ?Meredith Staggers ?05/30/2021, 9:00 AM  ? ? ? ?

## 2021-05-30 NOTE — Progress Notes (Signed)
Occupational Therapy Session Note ? ?Patient Details  ?Name: Alan Rubio ?MRN: [id number] ?Date of Birth: 1931-04-22 ? ?Today's Date: 05/30/2021 ?OT Individual Time: 3903-0092 ?OT Individual Time Calculation (min): 60 min  ? ? ?Short Term Goals: ?Week 1:  OT Short Term Goal 1 (Week 1): Pt will complete sit<>stand using RW with min assist in prep for LB ADLs ?OT Short Term Goal 2 (Week 1): Pt will complete stand pivot <> BSC with mod assist in prep for toileting ?OT Short Term Goal 3 (Week 1): Pt will donn/doff pull up and pants with min assist. ?OT Short Term Goal 4 (Week 1): Pt will bathe LB at sit<>stand level using LHS and shower bench as needed with min assist. ? ?Skilled Therapeutic Interventions/Progress Updates:  ?Pt received asleep in supine needing MAX environmental cues to arouse, pt needed max encouragement to agree to OT intervention, however pt making minimal verbalizations d/t lethargy. Pt very slow to arouse and c/o neck pain, RN enter to give pain meds. Pt stating the bathroom room door was moving, checked vitals bed level.  ?161/84 ( 101) HR 75. Pt then reports urine void, pt not initiated any aspects of bed mobility therefore deferred pericare from bed level, MAX A to roll R<>L and total A for pericare and brief change.  ?Donned ABD binder and teds and assisted pt to EOB in an attempt to arouse pt more. Pt needed MAX A for all aspects fo bed mobility and initial MIN A for static sitting balance.BP from EOB- 145/88 ( 106)  ? Used stedy for transfer d/t lethargy, MODA to stand in stedy, total A transfer to recliner. BP from recliner- 171/91 ( 115), pt family arrived and pt did arouse more initially when they arrived. ?Alerted RN of BPs and asked nursing to return pt back to bed after 1 hr.  ?Pt left up in recliner with alarm belt activated and all needs within reach.     ? ? ?Therapy Documentation ?Precautions:  ?Precautions ?Precautions: Fall, Other (comment) ?Precaution Comments: HOH,  orthostatic ?Restrictions ?Weight Bearing Restrictions: No ? ? ?Therapy/Group: Individual Therapy ? ?Precious Haws ?05/30/2021, 12:07 PM ?

## 2021-05-31 LAB — BASIC METABOLIC PANEL
Anion gap: 9 (ref 5–15)
BUN: 23 mg/dL (ref 8–23)
CO2: 22 mmol/L (ref 22–32)
Calcium: 9.7 mg/dL (ref 8.9–10.3)
Chloride: 109 mmol/L (ref 98–111)
Creatinine, Ser: 0.79 mg/dL (ref 0.61–1.24)
GFR, Estimated: >60 mL/min (ref >60)
Glucose, Bld: 141 mg/dL — ABNORMAL HIGH (ref 70–99)
Potassium: 4.2 mmol/L (ref 3.5–5.1)
Sodium: 140 mmol/L (ref 135–145)

## 2021-05-31 LAB — GLUCOSE, CAPILLARY
Glucose-Capillary: 143 mg/dL — ABNORMAL HIGH (ref 70–99)
Glucose-Capillary: 180 mg/dL — ABNORMAL HIGH (ref 70–99)
Glucose-Capillary: 105 mg/dL — ABNORMAL HIGH (ref 70–99)
Glucose-Capillary: 201 mg/dL — ABNORMAL HIGH (ref 70–99)

## 2021-05-31 LAB — URINE CULTURE

## 2021-05-31 MED ORDER — DICLOFENAC SODIUM 1 % EX GEL
2.0000 g | Freq: Four times a day (QID) | CUTANEOUS | Status: DC
  Administered 2021-05-31 – 2021-06-09 (×32): 2 g via TOPICAL
  Filled 2021-05-31: qty 100

## 2021-05-31 MED ORDER — SORBITOL 70 % SOLN
30.0000 mL | Freq: Every day | Status: DC | PRN
  Administered 2021-06-01: 30 mL via ORAL
  Filled 2021-05-31 (×2): qty 30

## 2021-05-31 NOTE — Progress Notes (Signed)
?                                                       PROGRESS NOTE ? ? ?Subjective/Complaints: ?Patient seen with son at bedside.  Has hearing aids in but doesn't know how to turn on.  Also has some scabs on both cheeks, pt and son not sure about what caused the scabs. ? ?ROS: Limited due to cognitive/behavioral  ? ? ?Objective: ?  ?No results found. ?Recent Labs  ?  05/30/21 ?1432  ?WBC 6.8  ?HGB 13.6  ?HCT 40.9  ?PLT 252  ? ? ?Recent Labs  ?  05/30/21 ?1432 05/31/21 ?0505  ?NA 140 140  ?K 4.3 4.2  ?CL 108 109  ?CO2 22 22  ?GLUCOSE 217* 141*  ?BUN 28* 23  ?CREATININE 0.83 0.79  ?CALCIUM 10.1 9.7  ? ? ?Intake/Output Summary (Last 24 hours) at 05/31/2021 1331 ?Last data filed at 05/31/2021 0400 ?Gross per 24 hour  ?Intake 897.97 ml  ?Output 250 ml  ?Net 647.97 ml  ?  ? ?  ? ?Physical Exam: ?Vital Signs ?Blood pressure (!) 180/82, pulse 68, temperature 98.5 ?F (36.9 ?C), temperature source Oral, resp. rate 16, height 6' (1.829 m), weight 75.4 kg, SpO2 99 %. ? ? ?Constitutional: No distress . Vital signs reviewed. ?HEENT: NCAT, EOMI, oral membranes moist ?Neck: supple ?Cardiovascular: RRR without murmur. No JVD    ?Respiratory/Chest: CTA Bilaterally without wheezes or rales. Normal effort    ?GI/Abdomen: BS +, non-tender, non-distended ?Ext: no clubbing, cyanosis, or edema ?Skin: Scabs on bilateral cheek, crusted over. ?Psych: pleasant and cooperative  ?Skin: Redness around penile head and scrotum ?Neurologic: Opens eyes to commands. Can answer some questions, alert to self, place, time. Cranial nerves II through XII intact, motor strength is 4/5 in bilateral deltoid, bicep, tricep, grip, Left hip flexor, knee extensors, ankle dorsiflexor and plantar flexor ?3- RIgh tHF, KE, ADF, 4- Left HF, KE, ADF ?Sensory exam normal sensation to light touch  in bilateral upper and lower extremities ?Musculoskeletal: Full range of motion in all 4 extremities. No joint swelling ? ? ?Assessment/Plan: ?1. Functional deficits which  require 3+ hours per day of interdisciplinary therapy in a comprehensive inpatient rehab setting. ?Physiatrist is providing close team supervision and 24 hour management of active medical problems listed below. ?Physiatrist and rehab team continue to assess barriers to discharge/monitor patient progress toward functional and medical goals ? ?Care Tool: ? ?Bathing ?   ?Body parts bathed by patient: Right arm  ? Body parts bathed by helper: Right lower leg ?  ?  ?Bathing assist Assist Level: Minimal Assistance - Patient > 75% ?  ?  ?Upper Body Dressing/Undressing ?Upper body dressing   ?What is the patient wearing?: Pull over shirt ?   ?Upper body assist Assist Level: Minimal Assistance - Patient > 75% ?   ?Lower Body Dressing/Undressing ?Lower body dressing ? ? ?   ?What is the patient wearing?: Incontinence brief ? ?  ? ?Lower body assist Assist for lower body dressing: Moderate Assistance - Patient 50 - 74% ?   ? ?Toileting ?Toileting    ?Toileting assist Assist for toileting: Moderate Assistance - Patient 50 - 74% ?  ?  ?Transfers ?Chair/bed transfer ? ?Transfers assist ?   ? ?Chair/bed transfer assist level: Dependent - mechanical lift (  stedy) ?Chair/bed transfer assistive device: Armrests, Walker ?  ?Locomotion ?Ambulation ? ? ?Ambulation assist ? ?   ? ?Assist level: Minimal Assistance - Patient > 75% ?Assistive device: Walker-rolling ?Max distance: 65f  ? ?Walk 10 feet activity ? ? ?Assist ? Walk 10 feet activity did not occur: Safety/medical concerns (required use of AD) ? ?  ?   ? ?Walk 50 feet activity ? ? ?Assist Walk 50 feet with 2 turns activity did not occur: Safety/medical concerns (required use of AD) ? ?  ?   ? ? ?Walk 150 feet activity ? ? ?Assist Walk 150 feet activity did not occur: Safety/medical concerns ? ?  ?  ?  ? ?Walk 10 feet on uneven surface  ?activity ? ? ?Assist Walk 10 feet on uneven surfaces activity did not occur: Safety/medical concerns ? ? ?  ?   ? ?Wheelchair ? ? ? ? ?Assist Is  the patient using a wheelchair?: Yes (used for transport to/from gym) ?Type of Wheelchair: Manual ?  ? ?Wheelchair assist level: Dependent - Patient 0% ?   ? ? ?Wheelchair 50 feet with 2 turns activity ? ? ? ?Assist ? ?  ?  ? ? ?Assist Level: Dependent - Patient 0%  ? ?Wheelchair 150 feet activity  ? ? ? ?Assist ?   ? ? ?Assist Level: Dependent - Patient 0%  ? ?Blood pressure (!) 180/82, pulse 68, temperature 98.5 ?F (36.9 ?C), temperature source Oral, resp. rate 16, height 6' (1.829 m), weight 75.4 kg, SpO2 99 %. ? ?1. 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            -patient may shower ?            -ELOS/Goals: 7-10 days ?            -Continue CIR therapies including PT, OT, and SLP   ?2.  Antithrombotics: ?-DVT/anticoagulation:  Pharmaceutical: Lovenox ?            -antiplatelet therapy: Aspirin 81 mg daily and Plavix 75 mg daily x3 weeks then Plavix alone ?5/7 Continue ASA 81 mg/Plavix 75 mg daily x 3 weeks, followed by Plavix alone. ?3. Pain Management: Tylenol as needed ?4. Mood: Melatonin 1.5 mg nightly provide emotional support ?            -antipsychotic agents: N/A ?5. Neuropsych: This patient is capable of making decisions on his own behalf. ?6. Skin/Wound Care: Routine skin checks ?7. Fluids/Electrolytes/Nutrition: Routine in and outs with follow-up chemistries ?-5/7 Patient was on 75 ml/hr 0.45 NS, now changed to 50 ml/hr 0.45 NS. ?Some improvement in BUN, trending down from 28 (5/6) to 23 today. ? ?  Latest Ref Rng & Units 05/31/2021  ?  5:05 AM 05/30/2021  ?  2:32 PM 05/28/2021  ? 10:33 AM  ?BMP  ?Glucose 70 - 99 mg/dL 141   217   197    ?BUN 8 - 23 mg/dL '23   28   30    '$ ?Creatinine 0.61 - 1.24 mg/dL 0.79   0.83   0.97    ?Sodium 135 - 145 mmol/L 140   140   139    ?Potassium 3.5 - 5.1 mmol/L 4.2    4.3   3.9    ?Chloride 98 - 111 mmol/L 109  108   104    ?CO2 22 - 32 mmol/L '22   22   26    '$ ?Calcium 8.9 - 10.3 mg/dL 9.7   10.1   10.0    ?  ?8.  Type 2 diabetes mellitus.  Hemoglobin A1c 7.7.  Jardiance 25 mg daily as well as SSI.  Actos 15 mg daily on hold resumed 5/5 ?CBG (last 3)  ? ?Recent Labs  ?  05/30/21 ?2118 05/31/21 ?0617 05/31/21 ?1222  ?GLUCAP 217* 143* 180*  ? 5/6 Actos resumed yesterday.  Observe for response today ?5/7 CBG's from 143-217 today, continue current regimen Actos 15 mg daily ? ?9.  Hypertension.  Well controlled, continue Cozaar 50 mg daily, Cardura 4 mg daily ?Vitals:  ? 05/30/21 1943 05/31/21 0410  ?BP: (!) 165/79 (!) 180/82  ?Pulse: 70 68  ?Resp: 20 16  ?Temp: 98 ?F (36.7 ?C) 98.5 ?F (36.9 ?C)  ?SpO2: 99% 99%  ?Cardura changed to '2mg'$  Q 12 due to lability   ?5/6 remains elevated at times.  Monitor for trend ?5/7 Continues to remain elevated at times, but fluctuates, monitor trend. ?10.  Hyperlipidemia.  Continue Lipitor 40 mg daily, Zetia 10 mg daily ?11.  History of gout.  Continue Zyloprim 300 mg daily.  Monitor for any gout flareups ?12.  Hypothyroidism.  Continue Synthroid ?  ?LOS: ?6 days ?A FACE TO FACE EVALUATION WAS PERFORMED ? ?Luetta Nutting ?05/31/2021, 1:31 PM  ? ? ? ?

## 2021-06-01 LAB — CREATININE, SERUM
Creatinine, Ser: 0.85 mg/dL (ref 0.61–1.24)
GFR, Estimated: >60 mL/min (ref >60)

## 2021-06-01 LAB — GLUCOSE, CAPILLARY
Glucose-Capillary: 163 mg/dL — ABNORMAL HIGH (ref 70–99)
Glucose-Capillary: 166 mg/dL — ABNORMAL HIGH (ref 70–99)
Glucose-Capillary: 235 mg/dL — ABNORMAL HIGH (ref 70–99)
Glucose-Capillary: 246 mg/dL — ABNORMAL HIGH (ref 70–99)

## 2021-06-01 MED ORDER — DOCUSATE SODIUM 100 MG PO CAPS
100.0000 mg | ORAL_CAPSULE | Freq: Two times a day (BID) | ORAL | Status: DC
  Administered 2021-06-01 – 2021-06-09 (×16): 100 mg via ORAL
  Filled 2021-06-01 (×16): qty 1

## 2021-06-01 MED ORDER — POLYETHYLENE GLYCOL 3350 17 G PO PACK
17.0000 g | PACK | Freq: Every day | ORAL | Status: DC
  Administered 2021-06-01 – 2021-06-08 (×8): 17 g via ORAL
  Filled 2021-06-01 (×8): qty 1

## 2021-06-01 NOTE — Progress Notes (Signed)
Speech Language Pathology Daily Session Note ? ?Patient Details  ?Name: Alan Rubio ?MRN: [id number] ?Date of Birth: 04-Aug-1931 ? ?Today's Date: 06/01/2021 ?SLP Individual Time: 4098-1191 ?SLP Individual Time Calculation (min): 27 min ? ?Short Term Goals: ?Week 1: SLP Short Term Goal 1 (Week 1): Patient will consume current diet with minimal overt s/s of aspiration and min A cues for safe swallowing precautions/strategies ?SLP Short Term Goal 2 (Week 1): Patient will orient x4 with min A for use of external aids ?SLP Short Term Goal 3 (Week 1): Patient will complete basic-to-mildly complex problem solving tasks with mod A verbal/visual cues ?SLP Short Term Goal 4 (Week 1): Patient will utilize external memory aids to increase functional recall with mod A verbal/visual cues ?SLP Short Term Goal 5 (Week 1): Patient will implement speech intelligiblity strategies at the phrase/sentence level with mod A verbal cues to reach 75% intelligiblity ? ?Skilled Therapeutic Interventions:  ?Session 1: Skilled ST treatment focused on cognitive and swallowing goals. SLP facilitated PO trials of nectar thick liquids vs. thin liquids d/t report of increased coughing over the weekend with thin liquids, and over all what appeared to be increased fatigue resulting in downgrade to nectar thick liquids. Pt consumed nectar thick liquids by cup and straw with what appeared to be swift swallow response and no overt s/sx of aspiration; clear vocal quality post swallows. Total A was provided for denture care prior to thin liquid trials; Pt applied denture adhesive with min A; sup A for oral care using oral sponge and mouthwash. Pt consumed thin liquids by straw with immediate cough x1 and delayed cough x1 of 8 total opportunities with what appeared to be small-to-average size sips. SLP recommends continuation of current diet and further assessment of thin liquid tolerance during meal immediately following session. Pt/family in agreement. Pt was  oriented x4 with sup A cues for use of external aids. Pt required min-to-mod A verbal cues to redirect attention throughout session. SLP educated spouse and son regarding strategies to maximize attention and memory. Patient was left in bed with alarm activated and immediate needs within reach at end of session. Continue per current plan of care.   ? ?Session 2: Pt seen for diet tolerance of dys 2 textures and nectar thick liquids vs. thin. Pt performed self feeding with sup A following set-up. Of note - pt was dependent for self feeding over the weekend. Pt consumed dys 2 textures with mildly delayed yet effective mastication, mildly delayed transit, trace oral residuals post swallow, and without overt s/sx of aspiration. Efficiency and effectiveness of oral prep appeared to be improved as compared to session on 5/06 where pt was notable to oral delay. Pt consumed thin liquids by straw with immediate cough during 3/4 occasions; no overt s/sx of aspiration were observed with nectar thick liquids by cup. SLP recommend continuation of dys 2 diet and nectar thick liquids with MBS to further evaluate this week. Informed pt/family; both parties verbalized understanding and agreement with MBS - scheduling TBD. Patient was left in bed with alarm activated and immediate needs within reach at end of session. Continue per current plan of care.   ? ?Pain ? None ? ?Therapy/Group: Individual Therapy ? ?Breon Rehm T Yancy Knoble ?06/01/2021, 5:01 PM ?

## 2021-06-01 NOTE — Progress Notes (Signed)
Occupational Therapy Note ? ?Patient Details  ?Name: Alan Rubio ?MRN: [id number] ?Date of Birth: 1931/06/30 ? ?Today's Date: 06/01/2021 ?OT Missed Time: 60 Minutes ?Missed Time Reason: Patient unwilling/refused to participate without medical reason;Patient fatigue ? ?Pt received supine in bed reporting need to void bowels, offered OOB mobility to attempt to void bowels with pt declining. Offered bed pan as well with pt stating "I can't do anything." Will check back as time allows to make up for missed minutes.   ? ?Precious Haws ?06/01/2021, 3:40 PM ?

## 2021-06-01 NOTE — Progress Notes (Signed)
Physical Therapy Session Note ? ?Patient Details  ?Name: Alan Rubio ?MRN: [id number] ?Date of Birth: March 10, 1931 ? ?Today's Date: 06/01/2021 ?PT Individual Time: 1027-2536 ?PT Individual Time Calculation (min): 30 min  ? ?Short Term Goals: ?Week 1:  PT Short Term Goal 1 (Week 1): Pt will perform supine<>sit with min assist ?PT Short Term Goal 2 (Week 1): Pt will perform sit<>stands using LRAD with min assist ?PT Short Term Goal 3 (Week 1): Pt will perform bed<>chair transfers using LRAD with min assist ?PT Short Term Goal 4 (Week 1): Pt will ambulate at least 97f using LRAD with CGA ?PT Short Term Goal 5 (Week 1): Pt will participate in standardized outcome measure assessing his balance ? ?Skilled Therapeutic Interventions/Progress Updates:  ?  Pt received supine in bed, reports he is having neck pain this AM. Notified nursing who is able to provide pain medication during session. Supine BP 187/95 in LUE, 178/79 in RUE. Per nursing pt normally has elevated BP in the AM, received BP medication about one hour prior to start of therapy session. Pt agreeable to attempt sitting up to EOB. Supine to sidelying with max A. Pt unable to follow cues to assist with bringing LE to EOB, unable to lift trunk from bed due to increase in neck pain. Assisted pt with applying Volataren gel to posterior neck and shoulder region for pain management. Pt declines any further participation in session due to pain this AM. Pt returned to supine with min A. Pt left supine in bed with needs in reach, bed alarm in place. Pt missed 30 min of scheduled therapy session due to pain. ? ?Therapy Documentation ?Precautions:  ?Precautions ?Precautions: Fall, Other (comment) ?Precaution Comments: HOH, orthostatic ?Restrictions ?Weight Bearing Restrictions: No ?General: ?PT Amount of Missed Time (min): 30 Minutes ?PT Missed Treatment Reason: Pain ? ? ? ? ? ?Therapy/Group: Individual Therapy ? ?TExcell Seltzer PT, DPT, CSRS ?06/01/2021, 12:10 PM  ?

## 2021-06-01 NOTE — Progress Notes (Signed)
?                                                       PROGRESS NOTE ? ? ?Subjective/Complaints: ? ? ?ROS: Limited due to cognitive/behavioral  ? ? ?Objective: ?  ?No results found. ?Recent Labs  ?  05/30/21 ?1432  ?WBC 6.8  ?HGB 13.6  ?HCT 40.9  ?PLT 252  ? ? ? ?Recent Labs  ?  05/30/21 ?1432 05/31/21 ?0505 06/01/21 ?0174  ?NA 140 140  --   ?K 4.3 4.2  --   ?CL 108 109  --   ?CO2 22 22  --   ?GLUCOSE 217* 141*  --   ?BUN 28* 23  --   ?CREATININE 0.83 0.79 0.85  ?CALCIUM 10.1 9.7  --   ? ? ? ?Intake/Output Summary (Last 24 hours) at 06/01/2021 0726 ?Last data filed at 05/31/2021 1342 ?Gross per 24 hour  ?Intake 220 ml  ?Output --  ?Net 220 ml  ? ?  ? ?  ? ?Physical Exam: ?Vital Signs ?Blood pressure (!) 157/88, pulse 82, temperature 98.7 ?F (37.1 ?C), temperature source Oral, resp. rate 16, height 6' (1.829 m), weight 75.4 kg, SpO2 99 %. ? ?General: No acute distress ?Mood and affect are appropriate ?Heart: Regular rate and rhythm no rubs murmurs or extra sounds ?Lungs: Clear to auscultation, breathing unlabored, no rales or wheezes ?Abdomen: Positive bowel sounds, soft nontender to palpation, nondistended ?Extremities: No clubbing, cyanosis, or edema ?Skin: No evidence of breakdown, no evidence of rash ? ? ?Skin: Redness around penile head and scrotum ?Neurologic: Opens eyes to commands. Can answer some questions, alert to self, place, time. Cranial nerves II through XII intact, motor strength is 4/5 in bilateral deltoid, bicep, tricep, grip, Left hip flexor, knee extensors, ankle dorsiflexor and plantar flexor ?3- RIght HF, KE, ADF, 4- Left HF, KE, ADF ?Sensory exam normal sensation to light touch  in bilateral upper and lower extremities ?Musculoskeletal: Full range of motion in all 4 extremities. No joint swelling ? ? ?Assessment/Plan: ?1. Functional deficits which require 3+ hours per day of interdisciplinary therapy in a comprehensive inpatient rehab setting. ?Physiatrist is providing close team supervision and  24 hour management of active medical problems listed below. ?Physiatrist and rehab team continue to assess barriers to discharge/monitor patient progress toward functional and medical goals ? ?Care Tool: ? ?Bathing ?   ?Body parts bathed by patient: Right arm  ? Body parts bathed by helper: Right lower leg ?  ?  ?Bathing assist Assist Level: Minimal Assistance - Patient > 75% ?  ?  ?Upper Body Dressing/Undressing ?Upper body dressing   ?What is the patient wearing?: Pull over shirt ?   ?Upper body assist Assist Level: Minimal Assistance - Patient > 75% ?   ?Lower Body Dressing/Undressing ?Lower body dressing ? ? ?   ?What is the patient wearing?: Incontinence brief ? ?  ? ?Lower body assist Assist for lower body dressing: Moderate Assistance - Patient 50 - 74% ?   ? ?Toileting ?Toileting    ?Toileting assist Assist for toileting: Moderate Assistance - Patient 50 - 74% ?  ?  ?Transfers ?Chair/bed transfer ? ?Transfers assist ?   ? ?Chair/bed transfer assist level: Dependent - mechanical lift (stedy) ?Chair/bed transfer assistive device: Armrests, Walker ?  ?Locomotion ?Ambulation ? ? ?  Ambulation assist ? ?   ? ?Assist level: Minimal Assistance - Patient > 75% ?Assistive device: Walker-rolling ?Max distance: 7f  ? ?Walk 10 feet activity ? ? ?Assist ? Walk 10 feet activity did not occur: Safety/medical concerns (required use of AD) ? ?  ?   ? ?Walk 50 feet activity ? ? ?Assist Walk 50 feet with 2 turns activity did not occur: Safety/medical concerns (required use of AD) ? ?  ?   ? ? ?Walk 150 feet activity ? ? ?Assist Walk 150 feet activity did not occur: Safety/medical concerns ? ?  ?  ?  ? ?Walk 10 feet on uneven surface  ?activity ? ? ?Assist Walk 10 feet on uneven surfaces activity did not occur: Safety/medical concerns ? ? ?  ?   ? ?Wheelchair ? ? ? ? ?Assist Is the patient using a wheelchair?: Yes (used for transport to/from gym) ?Type of Wheelchair: Manual ?  ? ?Wheelchair assist level: Dependent - Patient  0% ?   ? ? ?Wheelchair 50 feet with 2 turns activity ? ? ? ?Assist ? ?  ?  ? ? ?Assist Level: Dependent - Patient 0%  ? ?Wheelchair 150 feet activity  ? ? ? ?Assist ?   ? ? ?Assist Level: Dependent - Patient 0%  ? ?Blood pressure (!) 157/88, pulse 82, temperature 98.7 ?F (37.1 ?C), temperature source Oral, resp. rate 16, height 6' (1.829 m), weight 75.4 kg, SpO2 99 %. ? ?1. 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            -patient may shower ?            -ELOS/Goals: 5/17 ?            -Continue CIR therapies including PT, OT, and SLP   ?2.  Antithrombotics: ?-DVT/anticoagulation:  Pharmaceutical: Lovenox ?            -antiplatelet therapy: Aspirin 81 mg daily and Plavix 75 mg daily x3 weeks then Plavix alone ?Plavix alone as of 5/19 ?3. Pain Management: Tylenol as needed ?4. Mood: Melatonin 1.5 mg nightly provide emotional support ?            -antipsychotic agents: N/A ?5. Neuropsych: This patient is capable of making decisions on his own behalf. ?6. Skin/Wound Care: Routine skin checks ?7. Fluids/Electrolytes/Nutrition: Routine in and outs with follow-up chemistries ?Recheck BMET ? ?  Latest Ref Rng & Units 06/01/2021  ?  5:35 AM 05/31/2021  ?  5:05 AM 05/30/2021  ?  2:32 PM  ?BMP  ?Glucose 70 - 99 mg/dL  141   217    ?BUN 8 - 23 mg/dL  23   28    ?Creatinine 0.61 - 1.24 mg/dL 0.85   0.79   0.83    ?Sodium 135 - 145 mmol/L  140   140    ?Potassium 3.5 - 5.1 mmol/L  4.2   4.3    ?Chloride 98 - 111 mmol/L  109   108    ?CO2 22 - 32 mmol/L  22   22    ?Calcium 8.9 - 10.3 mg/dL  9.7   10.1    ?  ?8.  Type 2 diabetes mellitus.  Hemoglobin A1c 7.7.  Jardiance 25 mg daily as well as SSI.  Actos 15 mg daily  on hold resumed 5/5 ?CBG (last 3)  ? ?Recent Labs  ?  05/31/21 ?1740 05/31/21 ?2046 06/01/21 ?0542  ?GLUCAP 105*  201* 163*  ? ? 5/8 CBG controlled Jardiance and actos  ? ?9.  Hypertension.  Well controlled, continue Cozaar 50 mg daily, Cardura 4 mg daily ?Vitals:  ? 05/31/21 1924 06/01/21 0400  ?BP: 140/75 (!) 157/88  ?Pulse: 83 82  ?Resp: 16   ?Temp: 98.3 ?F (36.8 ?C) 98.7 ?F (37.1 ?C)  ?SpO2: 99% 99%  ?Cardura changed to '2mg'$  Q 12 due to lability   ?5/6 remains elevated at times.  Monitor for trend ?5/7 Continues to remain elevated at times, but fluctuates, monitor trend. ?10.  Hyperlipidemia.  Continue Lipitor 40 mg daily, Zetia 10 mg daily ?11.  History of gout.  Continue Zyloprim 300 mg daily.  Monitor for any gout flareups ?12.  Hypothyroidism.  Continue Synthroid ?  ?LOS: ?7 days ?A FACE TO FACE EVALUATION WAS PERFORMED ? ?Alan Rubio ?06/01/2021, 7:26 AM  ? ? ? ?

## 2021-06-02 ENCOUNTER — Inpatient Hospital Stay (HOSPITAL_COMMUNITY): Payer: Medicare Other

## 2021-06-02 ENCOUNTER — Encounter (HOSPITAL_COMMUNITY): Payer: Self-pay | Admitting: Physical Medicine & Rehabilitation

## 2021-06-02 LAB — CBC WITH DIFFERENTIAL/PLATELET
Abs Immature Granulocytes: 0.03 10*3/uL (ref 0.00–0.07)
Basophils Absolute: 0.1 10*3/uL (ref 0.0–0.1)
Basophils Relative: 0 %
Eosinophils Absolute: 0.1 10*3/uL (ref 0.0–0.5)
Eosinophils Relative: 1 %
HCT: 38.8 % — ABNORMAL LOW (ref 39.0–52.0)
Hemoglobin: 13.7 g/dL (ref 13.0–17.0)
Immature Granulocytes: 0 %
Lymphocytes Relative: 9 %
Lymphs Abs: 1 10*3/uL (ref 0.7–4.0)
MCH: 32.5 pg (ref 26.0–34.0)
MCHC: 35.3 g/dL (ref 30.0–36.0)
MCV: 91.9 fL (ref 80.0–100.0)
Monocytes Absolute: 1 10*3/uL (ref 0.1–1.0)
Monocytes Relative: 9 %
Neutro Abs: 9.1 10*3/uL — ABNORMAL HIGH (ref 1.7–7.7)
Neutrophils Relative %: 81 %
Platelets: 268 10*3/uL (ref 150–400)
RBC: 4.22 MIL/uL (ref 4.22–5.81)
RDW: 13.1 % (ref 11.5–15.5)
WBC: 11.3 10*3/uL — ABNORMAL HIGH (ref 4.0–10.5)
nRBC: 0 % (ref 0.0–0.2)

## 2021-06-02 LAB — URINALYSIS, ROUTINE W REFLEX MICROSCOPIC
Bacteria, UA: NONE SEEN
Bilirubin Urine: NEGATIVE
Glucose, UA: >=500 mg/dL — AB
Hgb urine dipstick: NEGATIVE
Ketones, ur: NEGATIVE mg/dL
Nitrite: NEGATIVE
Protein, ur: NEGATIVE mg/dL
Specific Gravity, Urine: 1.029 (ref 1.005–1.030)
pH: 5 (ref 5.0–8.0)

## 2021-06-02 LAB — GLUCOSE, CAPILLARY
Glucose-Capillary: 182 mg/dL — ABNORMAL HIGH (ref 70–99)
Glucose-Capillary: 142 mg/dL — ABNORMAL HIGH (ref 70–99)
Glucose-Capillary: 258 mg/dL — ABNORMAL HIGH (ref 70–99)
Glucose-Capillary: 106 mg/dL — ABNORMAL HIGH (ref 70–99)

## 2021-06-02 LAB — BASIC METABOLIC PANEL
Anion gap: 7 (ref 5–15)
BUN: 23 mg/dL (ref 8–23)
CO2: 21 mmol/L — ABNORMAL LOW (ref 22–32)
Calcium: 9.5 mg/dL (ref 8.9–10.3)
Chloride: 109 mmol/L (ref 98–111)
Creatinine, Ser: 0.81 mg/dL (ref 0.61–1.24)
GFR, Estimated: >60 mL/min (ref >60)
Glucose, Bld: 153 mg/dL — ABNORMAL HIGH (ref 70–99)
Potassium: 4 mmol/L (ref 3.5–5.1)
Sodium: 137 mmol/L (ref 135–145)

## 2021-06-02 MED ORDER — SODIUM CHLORIDE 0.45 % IV SOLN: INTRAVENOUS | Status: DC

## 2021-06-02 MED ORDER — LOSARTAN POTASSIUM 50 MG PO TABS
75.0000 mg | ORAL_TABLET | Freq: Every day | ORAL | Status: DC
  Administered 2021-06-02 – 2021-06-09 (×8): 75 mg via ORAL
  Filled 2021-06-02 (×8): qty 1

## 2021-06-02 MED ORDER — ADULT MULTIVITAMIN W/MINERALS CH
1.0000 | ORAL_TABLET | Freq: Every day | ORAL | Status: DC
  Administered 2021-06-02 – 2021-06-09 (×8): 1 via ORAL
  Filled 2021-06-02 (×8): qty 1

## 2021-06-02 MED ORDER — PIOGLITAZONE HCL 30 MG PO TABS
30.0000 mg | ORAL_TABLET | Freq: Every day | ORAL | Status: DC
  Administered 2021-06-02 – 2021-06-09 (×8): 30 mg via ORAL
  Filled 2021-06-02 (×9): qty 1

## 2021-06-02 NOTE — Progress Notes (Signed)
?                                                       PROGRESS NOTE ? ? ?Subjective/Complaints: ? ?Poor appetite , freq urination  and BMs ? ?ROS: Limited due to cognitive/behavioral  ? ? ?Objective: ?  ?No results found. ?Recent Labs  ?  05/30/21 ?1432  ?WBC 6.8  ?HGB 13.6  ?HCT 40.9  ?PLT 252  ? ? ? ?Recent Labs  ?  05/31/21 ?0505 06/01/21 ?8546 06/02/21 ?2703  ?NA 140  --  137  ?K 4.2  --  4.0  ?CL 109  --  109  ?CO2 22  --  21*  ?GLUCOSE 141*  --  153*  ?BUN 23  --  23  ?CREATININE 0.79 0.85 0.81  ?CALCIUM 9.7  --  9.5  ? ? ? ?Intake/Output Summary (Last 24 hours) at 06/02/2021 0739 ?Last data filed at 06/01/2021 1800 ?Gross per 24 hour  ?Intake 0 ml  ?Output --  ?Net 0 ml  ? ?  ? ?  ? ?Physical Exam: ?Vital Signs ?Blood pressure (!) 154/82, pulse 77, temperature 98.6 ?F (37 ?C), resp. rate 14, height 6' (1.829 m), weight 73.2 kg, SpO2 97 %. ? ? ?General: No acute distress ?Mood and affect are appropriate ?Heart: Regular rate and rhythm no rubs murmurs or extra sounds ?Lungs: Clear to auscultation, breathing unlabored, no rales or wheezes ?Abdomen: Positive bowel sounds, soft nontender to palpation, nondistended ?Extremities: No clubbing, cyanosis, or edema ? ?Musculoskeletal: Full range of motion in all 4 extremities. No joint swelling ? ? ?Assessment/Plan: ?1. Functional deficits which require 3+ hours per day of interdisciplinary therapy in a comprehensive inpatient rehab setting. ?Physiatrist is providing close team supervision and 24 hour management of active medical problems listed below. ?Physiatrist and rehab team continue to assess barriers to discharge/monitor patient progress toward functional and medical goals ? ?Care Tool: ? ?Bathing ?   ?Body parts bathed by patient: Right arm  ? Body parts bathed by helper: Right lower leg ?  ?  ?Bathing assist Assist Level: Minimal Assistance - Patient > 75% ?  ?  ?Upper Body Dressing/Undressing ?Upper body dressing   ?What is the patient wearing?: Pull over  shirt ?   ?Upper body assist Assist Level: Minimal Assistance - Patient > 75% ?   ?Lower Body Dressing/Undressing ?Lower body dressing ? ? ?   ?What is the patient wearing?: Incontinence brief ? ?  ? ?Lower body assist Assist for lower body dressing: Moderate Assistance - Patient 50 - 74% ?   ? ?Toileting ?Toileting    ?Toileting assist Assist for toileting: Moderate Assistance - Patient 50 - 74% ?  ?  ?Transfers ?Chair/bed transfer ? ?Transfers assist ?   ? ?Chair/bed transfer assist level: Dependent - mechanical lift (stedy) ?Chair/bed transfer assistive device: Armrests, Walker ?  ?Locomotion ?Ambulation ? ? ?Ambulation assist ? ?   ? ?Assist level: Minimal Assistance - Patient > 75% ?Assistive device: Walker-rolling ?Max distance: 33f  ? ?Walk 10 feet activity ? ? ?Assist ? Walk 10 feet activity did not occur: Safety/medical concerns (required use of AD) ? ?  ?   ? ?Walk 50 feet activity ? ? ?Assist Walk 50 feet with 2 turns activity did not occur: Safety/medical concerns (required use of AD) ? ?  ?   ? ? ?  Walk 150 feet activity ? ? ?Assist Walk 150 feet activity did not occur: Safety/medical concerns ? ?  ?  ?  ? ?Walk 10 feet on uneven surface  ?activity ? ? ?Assist Walk 10 feet on uneven surfaces activity did not occur: Safety/medical concerns ? ? ?  ?   ? ?Wheelchair ? ? ? ? ?Assist Is the patient using a wheelchair?: Yes (used for transport to/from gym) ?Type of Wheelchair: Manual ?  ? ?Wheelchair assist level: Dependent - Patient 0% ?   ? ? ?Wheelchair 50 feet with 2 turns activity ? ? ? ?Assist ? ?  ?  ? ? ?Assist Level: Dependent - Patient 0%  ? ?Wheelchair 150 feet activity  ? ? ? ?Assist ?   ? ? ?Assist Level: Dependent - Patient 0%  ? ?Blood pressure (!) 154/82, pulse 77, temperature 98.6 ?F (37 ?C), resp. rate 14, height 6' (1.829 m), weight 73.2 kg, SpO2 97 %. ? ?1. Functional deficits secondary to left pontine infarct likely secondary to small vessel disease as well as history of CVA 03/2018 with  bilateral multifocal infarct including left thalamic scattered right frontal, right MCA/ACA and left MCA/PCA infarct ?            -patient may shower ?            -ELOS/Goals: 5/17 ?            -Continue CIR therapies including PT, OT, and SLP   ?2.  Antithrombotics: ?-DVT/anticoagulation:  Pharmaceutical: Lovenox ?            -antiplatelet therapy: Aspirin 81 mg daily and Plavix 75 mg daily x3 weeks then Plavix alone ?Plavix alone as of 5/19 ?3. Pain Management: Tylenol as needed ?4. Mood: Melatonin 1.5 mg nightly provide emotional support ?            -antipsychotic agents: N/A ?5. Neuropsych: This patient is capable of making decisions on his own behalf. ?6. Skin/Wound Care: Routine skin checks ?7. Fluids/Electrolytes/Nutrition: Routine in and outs with follow-up chemistries ?Recheck BMET ? ?  Latest Ref Rng & Units 06/02/2021  ?  5:49 AM 06/01/2021  ?  5:35 AM 05/31/2021  ?  5:05 AM  ?BMP  ?Glucose 70 - 99 mg/dL 153    141    ?BUN 8 - 23 mg/dL 23    23    ?Creatinine 0.61 - 1.24 mg/dL 0.81   0.85   0.79    ?Sodium 135 - 145 mmol/L 137    140    ?Potassium 3.5 - 5.1 mmol/L 4.0    4.2    ?Chloride 98 - 111 mmol/L 109    109    ?CO2 22 - 32 mmol/L 21    22    ?Calcium 8.9 - 10.3 mg/dL 9.5    9.7    ?  ?8.  Type 2 diabetes mellitus.  Hemoglobin A1c 7.7.  Jardiance 25 mg daily as well as SSI.  Actos 15 mg daily on hold resumed 5/5 ?CBG (last 3)  ? ?Recent Labs  ?  06/01/21 ?1648 06/01/21 ?2055 06/02/21 ?4098  ?GLUCAP 235* 246* 182*  ? ? 5/9 elevated , increase actos  ? ?9.  Hypertension.  Well controlled, continue Cozaar 50 mg daily, Cardura 4 mg daily ?Vitals:  ? 06/01/21 1936 06/02/21 0440  ?BP: (!) 164/82 (!) 154/82  ?Pulse: 92 77  ?Resp: 16 14  ?Temp: 98.7 ?F (37.1 ?C) 98.6 ?F (37 ?C)  ?SpO2: 98% 97%  ?  Cardura changed to '2mg'$  Q 12 due to lability   ?Increase Cozaar  ?10.  Hyperlipidemia.  Continue Lipitor 40 mg daily, Zetia 10 mg daily ?11.  History of gout.  Continue Zyloprim 300 mg daily.  Monitor for any gout  flareups ?12.  Hypothyroidism.  Continue Synthroid ? 13.  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  Freq small smears per RN, may need enema for retained stool , check KUB ?LOS: ?8 days ?A FACE TO FACE EVALUATION WAS PERFORMED ? ?Luanna Salk Janard Culp ?06/02/2021, 7:39 AM  ? ? ? ?

## 2021-06-02 NOTE — Progress Notes (Addendum)
Speech Language Pathology Weekly Progress and Session Note ? ?Patient Details  ?Name: Alan Rubio ?MRN: [id number] ?Date of Birth: Jun 02, 1931 ? ?Beginning of progress report period: May 26, 2021 ?End of progress report period: Jun 02, 2021 ? ?Today's Date: 06/02/2021 ?SLP Individual Time: 1100-1135 ?SLP Individual Time Calculation (min): 35 min and Today's Date: 06/02/2021 ?SLP Missed Time: 25 Minutes ?Missed Time Reason: Patient fatigue ? ?Short Term Goals: ?Week 1: SLP Short Term Goal 1 (Week 1): Patient will consume current diet with minimal overt s/s of aspiration and min A cues for safe swallowing precautions/strategies ?SLP Short Term Goal 1 - Progress (Week 1): Not met ?SLP Short Term Goal 2 (Week 1): Patient will orient x4 with min A for use of external aids ?SLP Short Term Goal 2 - Progress (Week 1): Met ?SLP Short Term Goal 3 (Week 1): Patient will complete basic-to-mildly complex problem solving tasks with mod A verbal/visual cues ?SLP Short Term Goal 3 - Progress (Week 1): Met ?SLP Short Term Goal 4 (Week 1): Patient will utilize external memory aids to increase functional recall with mod A verbal/visual cues ?SLP Short Term Goal 4 - Progress (Week 1): Met ?SLP Short Term Goal 5 (Week 1): Patient will implement speech intelligiblity strategies at the phrase/sentence level with mod A verbal cues to reach 75% intelligiblity ?SLP Short Term Goal 5 - Progress (Week 1): Met ? ?New Short Term Goals: ?Week 2: SLP Short Term Goal 1 (Week 2): Patient will consume current diet with minimal overt s/s of aspiration and min A cues for safe swallowing precautions/strategies ?SLP Short Term Goal 2 (Week 2): Patient will orient x4 with sup-to-min A for use of external aids ?SLP Short Term Goal 3 (Week 2): Patient will complete basic-to-mildly complex problem solving tasks with min A verbal/visual cues ?SLP Short Term Goal 4 (Week 2): Patient will utilize external memory aids to increase functional recall with min-to-mod A  verbal/visual cues ?SLP Short Term Goal 5 (Week 2): Patient will implement speech intelligiblity strategies at the phrase/sentence level with min-to-mod A verbal cues to reach 75% intelligiblity ? ?Weekly Progress Updates: Patient has made slow, yet functional gains and met 4 of 5 STGs this reporting period due in regards to orientation, functional recall with use of compensatory aids, basic problem solving, intellectual awareness; slow progress noted with speech intelligibility. However, it should be noted that as of Saturday 5/6, pt began exhibiting increased s/s of aspiration with thin liquid intake and was recommended downgrade to nectar thick liquids. Per further assessment on Monday 5/08, pt consumed thin liquids in isolation with minimal s/s of aspiration, however increased coughing was observed when consuming thin liquids in combination with food during meal. Pt is recommended to continue dysphagia 2 diet and nectar thick liquids with full supervision for all PO intake. A MBS is recommended to further evaluate and scheduled for 5/10. Pt exhibited increased lethargy/fatigue on 5/9 and was limited in participating in functional tasks to formally address cognitive/speech/swallowing goals. Concerns for decline in swallow function and lethargy were communicated to MD via secure Epic chat on morning of 5/09. Patient and family education is ongoing. Patient would benefit from continued skilled SLP intervention to maximize cognitive, speech, and swallow functioning.   ? ?Intensity: Minumum of 1-2 x/day, 30 to 90 minutes ?Frequency: 3 to 5 out of 7 days ?Duration/Length of Stay: 5/17 ?Treatment/Interventions: Cognitive remediation/compensation;Environmental controls;Internal/external aids;Speech/Language facilitation;Cueing hierarchy;Dysphagia/aspiration precaution training;Functional tasks;Therapeutic Activities;Patient/family education ? ?Daily Session ?Skilled Therapeutic Interventions: Pt received asleep in  recliner  requiring mod verbal stimulation to rouse. Pt quickly returned to sleep and required max A verbal/tactile stimulation to keep eyes open for >30 seconds throughout duration of session. Pt was accompanied by his son and spouse at bedside who reported concern for decline in status as evidenced by lethargy and limited motivation. Concerns for decline have been communicated to MD and therapy team. Pt was able to respond to basic questions regarding personal needs and comfort, however participation in functional cognitive tasks was limited. Pt agreeable to consume nectar thick grape juice in effort to increase hydration. By the time SLP returned to room with beverage pt was asleep and unable to rouse in order to safely consume PO intake. SLP educated family to withhold any food/drink from patient when unalert due to concern for aspiration/choking risk. Also educated family of MBS recommendations for tomorrow to further evaluate oropharyngeal swallow. Family in agreement. Pt missed 25 minutes of skilled ST intervention due to lethargy/fatigue. Patient was left in recliner with alarm activated and immediate needs within reach at end of session. Continue per current plan of care.      ?General  ?  ?Pain ? None ? ?Therapy/Group: Individual Therapy ? ?Graceann Boileau T Kinzi Frediani ?06/02/2021, 5:12 PM ? ? ? ? ? ? ?

## 2021-06-02 NOTE — Progress Notes (Signed)
Initial Nutrition Assessment ? ?DOCUMENTATION CODES:  ? ?Not applicable ? ?INTERVENTION:  ? ?Multivitamin w/ minerals daily ?Vital Cuisine Shake BID, each supplement provides 520 kcal and 22 grams of protein ?Magic cup TID with meals, each supplement provides 290 kcal and 9 grams of protein ?Encourage good PO intake ? ?NUTRITION DIAGNOSIS:  ? ?Inadequate oral intake related to poor appetite as evidenced by per patient/family report. ? ?GOAL:  ? ?Patient will meet greater than or equal to 90% of their needs ? ?MONITOR:  ? ?PO intake, Supplement acceptance, Weight trends, Labs ? ?REASON FOR ASSESSMENT:  ? ?Malnutrition Screening Tool ?  ? ?ASSESSMENT:  ? ?88 y.o. male admitted to CIR after a L CVA. PMH includes T2DM, HTN, and a stroke in 2020.  ? ?Unable to speak with pt, pt being cleaned up by NT. Spoke with family outside of room. ? ?Family reports that pt has not been eating well, states that he has been eating soft foods such as mashed potatoes, peaches, ice cream, and juices. Reports that he really likes the [employer] and they have been trying to order those for the pt; discussed that RD can order for pt. Discussed alternative supplements that are appropriate for pt liquid consistency, family agreeable. Reports that pt did require feeding assist today.   ?Per EMR, pt intake includes: ?5/05: Dinner 90% ?5/06: Breakfast 25%, Dinner 35% ?5/07: Breakfast 25%, Lunch 15% ?5/08: Dinner 0% ?5/09: Breakfast 20%, Lunch 60% ? ?Family unable to report pt UBW or if he has lost any weight, but states that they suspect he has lost weight. Per EMR, pt has had a 9% weight loss within a month, this is clinically significant for time frame.  ? ?RD asked the family if they knew if the pt would ever want a temporary feeding tube; family was unsure due to never talking about it before.  ? ?Medications reviewed and include: Colace, Jardiance, SSI 0-9 units, Megace, Actos, Miralax ?Labs reviewed: Hgb A1c 7.7%, 24 hr CBG  166-246 ? ?NUTRITION - FOCUSED PHYSICAL EXAM: ? ?Deferred to follow-up.  ? ?Diet Order:   ?Diet Order   ? ?       ?  DIET DYS 2 Room service appropriate? Yes; Fluid consistency: Nectar Thick  Diet effective now       ?  ? ?  ?  ? ?  ? ?EDUCATION NEEDS:  ? ?No education needs have been identified at this time ? ?Skin:  Skin Assessment: Reviewed RN Assessment ? ?Last BM:  5/9 ? ?Height:  ?Ht Readings from Last 1 Encounters:  ?05/25/21 6' (1.829 m)  ? ?Weight:  ?Wt Readings from Last 1 Encounters:  ?06/01/21 73.2 kg  ? ?Ideal Body Weight:  80.9 kg ? ?BMI:  Body mass index is 21.89 kg/m?. ? ?Estimated Nutritional Needs:  ?Kcal:  1800-2000 ?Protein:  90-105 grams ?Fluid:  >/= 1.8 L ? ? ? ?Hermina Barters RD, LDN ?Clinical Dietitian ?See [facility] for contact information.  ? ?

## 2021-06-02 NOTE — Progress Notes (Signed)
Occupational Therapy Weekly Progress Note ? ?Patient Details  ?Name: Alan Rubio ?MRN: [id number] ?Date of Birth: 10/13/1931 ? ?Beginning of progress report period: May 26, 2021 ?End of progress report period: Jun 02, 2021 ? ?Today's Date: 06/02/2021 ?OT Individual time:  0800-0855 ?55 min ?  ? ? ?Patient has met 0 of 4 short term goals.  Patient continues with lethargy, fluctuation in BP, confusion.  Patient needs increased time to process any directive toward self care status.  Patient with reported change in swallowing status - awaiting chest XRAY and MBS.  Patient's plan at this time is to return home with wife, son lives nearby.  Patient needs to show improvement in all aspects of ADL/ Functional mobility.   ? ?Patient continues to demonstrate the following deficits: muscle weakness, decreased cardiorespiratoy endurance, impaired timing and sequencing, unbalanced muscle activation, decreased coordination, and decreased motor planning, decreased midline orientation and decreased motor planning, decreased initiation, decreased attention, decreased awareness, decreased problem solving, decreased safety awareness, decreased memory, and delayed processing, and decreased sitting balance, decreased standing balance, decreased postural control, and decreased balance strategies and therefore will continue to benefit from skilled OT intervention to enhance overall performance with BADL and Reduce care partner burden. ? ?Patient progressing toward long term goals..  Continue plan of care. ? ?OT Short Term Goals ?Week 1:  OT Short Term Goal 1 (Week 1): Pt will complete sit<>stand using RW with min assist in prep for LB ADLs ?OT Short Term Goal 1 - Progress (Week 1): Progressing toward goal ?OT Short Term Goal 2 (Week 1): Pt will complete stand pivot <> BSC with mod assist in prep for toileting ?OT Short Term Goal 2 - Progress (Week 1): Not met ?OT Short Term Goal 3 (Week 1): Pt will donn/doff pull up and pants with min assist. ?OT  Short Term Goal 3 - Progress (Week 1): Not met ?OT Short Term Goal 4 (Week 1): Pt will bathe LB at sit<>stand level using LHS and shower bench as needed with min assist. ?OT Short Term Goal 4 - Progress (Week 1): Not met ?Week 2:  OT Short Term Goal 1 (Week 2): Pt will complete sit<>stand using RW with min assist in prep for LB ADLs ?OT Short Term Goal 2 (Week 2): Pt will complete stand pivot from slightly elevated surface <> BSC with mod assist in prep for toileting ?OT Short Term Goal 3 (Week 2): Pt will pull up and pants over hips while standing with min assist. ? ?Skilled Therapeutic Interventions/Progress Updates:  ?  Patient received this am sitting upright in bed - breakfast tray just delivered.  Nurse tech assisting him with dentures.  Patient very passive with eating.  Placed hearing aides to allow patient ability to interact/ follow instructions.   ?Encouraged patient to sit up in chair to eat breakfast.  Patient making no attempt to feed self, but would take bites if fed.  Patient declines need for toilet.  Assisted patient to edge of bed and patient with strong posterior lean and no attempt to self correct.  Once feet on floor, and mass shifted forward, able to stand from slightly elevated bed with mod assist - needed mod assist to maintain standing.  Once moving, Patient indicated need to void, transferred to commode.  Patient with large BM, assisted to clean with +2 help.  Patient then with incontinence of urine once standing.  Patient fatigued after toileting, and assisted back to bed.  Patient declined any further breakfast.   ?  Patient left in bed with NT present, and all needs met.   ? ? ?Therapy Documentation ?Precautions:  ?Precautions ?Precautions: Fall, Other (comment) ?Precaution Comments: HOH, orthostatic ?Restrictions ?Weight Bearing Restrictions: No ?General: ?  ?Vital Signs: ?Therapy Vitals ?Pulse Rate: 77 ?BP: 125/68 ?Patient Position (if appropriate): Sitting (for 10 minutes) ?Pain: ?Pain  Assessment ?Pain Scale: 0-10 ?Pain Score: 0-No pain ?ADL: ?ADL ?Upper Body Bathing: Minimal assistance ?Where Assessed-Upper Body Bathing: Edge of bed ?Lower Body Bathing: Moderate assistance ?Where Assessed-Lower Body Bathing: Edge of bed ?Upper Body Dressing: Minimal assistance ?Where Assessed-Upper Body Dressing: Edge of bed ?Lower Body Dressing: Moderate assistance ?Where Assessed-Lower Body Dressing: Edge of bed ?Vision ?  ?Perception  ?  ?Praxis ?  ?Exercises: ?  ?Other Treatments:   ? ? ?Therapy/Group: Individual Therapy ? ?Gellert, Kristin M ?06/02/2021, 12:40 PM  ?

## 2021-06-02 NOTE — Progress Notes (Signed)
Physical Therapy Weekly Progress Note ? ?Patient Details  ?Name: Alan Rubio ?MRN: [id number] ?Date of Birth: March 03, 1931 ? ?Beginning of progress report period: May 26, 2021 ?End of progress report period: Jun 02, 2021 ? ?Today's Date: 06/02/2021 ?PT Individual Time: 9629-5284 and 1324-4010 ?PT Individual Time Calculation (min): 57 min  and 10 min ?And ?Today's Date: 06/02/2021 ?PT Missed Time: 35 Minutes ?Missed Time Reason: Patient ill (Comment);Patient fatigue ? ?Patient has met 0 of 5 short term goals. Alan Rubio has experienced recent decline in functional mobility level in the past 3 days resulting in him not meeting his STGs. Patient is currently requiring total assist for bed mobility using bed features, mod/max assist for sit<>stand transfers using RW and then once in standing min/mod assist for pivoting to seat using RW, he has not ambulated since 05/28/21 prior to his recent decline in mobility status. Patient is presenting with labile BP requiring frequent assessments and use of thigh high TED hose and abdominal binder as needed when orthostatic drop in systolic BP occurs; however, is having episodes of significant hypertension. He is also demonstrating increased lethargy with more delayed initiation and motor planning of tasks. MD is aware of these changes and is performing additional medical tests. Pt will benefit from continued CIR level therapies and recommend extending ELOS and D/C date due to recent decline in functional mobility level as pt needs to achieve supervision level in order to safely D/C home with his wife's support and PRN support from his sons.  ? ?Patient continues to demonstrate the following deficits muscle weakness, decreased cardiorespiratoy endurance, impaired timing and sequencing, unbalanced muscle activation, and decreased motor planning, decreased midline orientation, decreased initiation, decreased attention, decreased awareness, decreased problem solving, decreased memory, and delayed  processing, and decreased sitting balance, decreased standing balance, decreased postural control, and decreased balance strategies and therefore will continue to benefit from skilled PT intervention to increase functional independence with mobility. ? ?Patient not currently progressing toward long term goals; however, will continue to monitor for need to downgrade based on medical findings indicating reason pt experiencing recent decline in mobility status. Plan of care revisions: Recommend extending ELOS and D/C date due to recent decline in mobility status. ? ?PT Short Term Goals ?Week 1:  PT Short Term Goal 1 (Week 1): Pt will perform supine<>sit with min assist ?PT Short Term Goal 1 - Progress (Week 1): Not met ?PT Short Term Goal 2 (Week 1): Pt will perform sit<>stands using LRAD with min assist ?PT Short Term Goal 2 - Progress (Week 1): Not met ?PT Short Term Goal 3 (Week 1): Pt will perform bed<>chair transfers using LRAD with min assist ?PT Short Term Goal 3 - Progress (Week 1): Not met ?PT Short Term Goal 4 (Week 1): Pt will ambulate at least 74f using LRAD with CGA ?PT Short Term Goal 4 - Progress (Week 1): Not met ?PT Short Term Goal 5 (Week 1): Pt will participate in standardized outcome measure assessing his balance ?PT Short Term Goal 5 - Progress (Week 1): Not met ?Week 2:  PT Short Term Goal 1 (Week 2): Pt will perform supine<>sit with min assist ?PT Short Term Goal 2 (Week 2): Pt will perform sit<>stands using LRAD with min assist ?PT Short Term Goal 3 (Week 2): Pt will perform bed<>chair transfers using LRAD with min assist ?PT Short Term Goal 4 (Week 2): Pt will ambulate at least 733fusing LRAD with CGA ?PT Short Term Goal 5 (Week 2): Pt will initiate  stair navigation ? ?Skilled Therapeutic Interventions/Progress Updates:  ?Ambulation/gait training;Cognitive remediation/compensation;Discharge planning;DME/adaptive equipment instruction;Functional mobility training;Pain management;Psychosocial  support;Splinting/orthotics;Therapeutic Activities;UE/LE Strength taining/ROM;Visual/perceptual remediation/compensation;Balance/vestibular training;Community reintegration;Disease management/prevention;Functional electrical stimulation;Neuromuscular re-education;Patient/family education;Stair training;Skin care/wound management;Therapeutic Exercise;UE/LE Coordination activities;Wheelchair propulsion/positioning  ? ?Session 1: Pt received sitting in recliner with B LEs elevated resting with eyes closed. With questioning pt reports he feels "fair." Upon request to participate in therapy session pt initially declined; however, with offer for a modified, limited session pt in agreement. Assessed vitals throughout session as follows.  ? ?In sitting, at beginning of session wearing B LE thigh high TED hose & abdominal binder: BP 185/78 (MAP 104), HR 78bpm  ?Removed abdominal binder and donned shirt with total assist as pt stating "I don't know how" but when therapist opened shirt and cued pt he was able to actively participate in threading arms through sleeves and lifting them up to place the shirt over his head. Left abdominal binder off remainder of session. Vitals reassessed in sitting: BP 185/87 (MAP 114), HR 86bpm  ?Notified the nurse and she was present to also monitor the patient. ? ?Sitting in recliner, threaded pants on LEs total assist - pt joking when therapist held up pants for him to thread his legs in, saying "You want me to jump in them." Despite this joke, pt remained lethargic throughout session often closing eyes to rest. Pt also with overall more delayed processing and initiation of tasks even having difficulty bringing trunk forward while sitting in recliner requiring min assist.  ? ?Sit>stand recliner>RW with heavy max assist for lifting to stand and maintaining anterior weight shift - requires max cuing to push up with hands from recliner armrests and to lean trunk forward while keeping feet back  underneath his BOS. One in standing with B UE support on RW progressed to min assist for balance. Pt suddenly reports need to use bathroom - nurse brought over [employer] ? ?R stand pivot to [facility] using RW with min assist for balance - pt takes very small steps but motor plans this task well taking small steps and keeping RW close to him. Dependent assist LB clothing management. Unable to void despite attempt on [facility]. Vitals once on BSC: BP 130/69 (MAP 88), HR 90bpm which is a significant drop compared to sitting in recliner. ? ?Sit>stand BSC>RW with mod assist at this time to lift into standing from taller seat while facilitating increased anterior trunk lean and weight shift. Progressed to min assist for standing balance with B UE support on RW. Dependent assist to pull pants up over hips. L stand pivot back to recliner using RW with min assist for balance as described above, but pt reporting lightheadedness as well as feeling "tired" and when going to sit noticed pt has L trunk lean/LOB.  ? ?Reassessed vitals while sitting in recliner wearing thigh high TED hose: BP 131/81 (MAP 95), HR 93bpm  ? ?Provided 5 minute seated rest break and then reassessed vitals: BP 148/75 (MAP 96), HR 79bpm  ? ?Pt declined participation in additional therapy at this time due to significant fatigue - pt quickly falling asleep once sitting in recliner. Therapist notified MD and nurse of pt's status. Pt's family arrived and therapist educated them on pt's CLOF with appearing decline since last week. Reassessed pt's vitals after ~12mnutes in sitting BP 125/68 (MAP 85), HR 77bpm  ? ? ?Session 2: Pt received resting, awake supine in bed with HOB elevated. Pt states "I feel very bad," but unable to describe further  how he was feeling despite questioning. Pt continues to appear lethargic with an unpleasant facial expression indicating not feeling well. Assessed vitals: BP 156/61 (MAP 89), HR 81bpm Attempted to encourage pt to sit on EOB and educated  pt on importance of increased activity even if modifications are needed to allow his participation; however, pt continues to decline OOB mobility at this time due to not feeling well. Therapist provided

## 2021-06-02 NOTE — Plan of Care (Signed)
?  Problem: Consults ?Goal: RH STROKE PATIENT EDUCATION ?Description: See Patient Education module for education specifics  ?Outcome: Progressing ?  ?Problem: RH BOWEL ELIMINATION ?Goal: RH STG MANAGE BOWEL WITH ASSISTANCE ?Description: STG Manage Bowel with mod I  Assistance. ?Outcome: Progressing ?Goal: RH STG MANAGE BOWEL W/MEDICATION W/ASSISTANCE ?Description: STG Manage Bowel with Medication with mod I Assistance. ?Outcome: Progressing ?  ?Problem: RH BLADDER ELIMINATION ?Goal: RH STG MANAGE BLADDER WITH ASSISTANCE ?Description: STG Manage Bladder With mod I /toileting Assistance ?Outcome: Progressing ?Goal: RH STG MANAGE BLADDER WITH MEDICATION WITH ASSISTANCE ?Description: STG Manage Bladder With Medication With mod I  Assistance. ?Outcome: Progressing ?  ?Problem: RH SAFETY ?Goal: RH STG ADHERE TO SAFETY PRECAUTIONS W/ASSISTANCE/DEVICE ?Description: STG Adhere to Safety Precautions With cues Assistance/Device. ?Outcome: Progressing ?  ?Problem: RH PAIN MANAGEMENT ?Goal: RH STG PAIN MANAGED AT OR BELOW PT'S PAIN GOAL ?Description: At or below level 4 w prns ?Outcome: Progressing ?  ?Problem: RH KNOWLEDGE DEFICIT ?Goal: RH STG INCREASE KNOWLEDGE OF DIABETES ?Description: Patient will be able to manage DM with medications and dietary modifications using handouts and educational resources independently ?Outcome: Progressing ?Goal: RH STG INCREASE KNOWLEDGE OF HYPERTENSION ?Description: Patient will be able to manage HTN with medications and dietary modifications using handouts and educational resources independently ?Outcome: Progressing ?Goal: RH STG INCREASE KNOWLEGDE OF HYPERLIPIDEMIA ?Description: Patient will be able to manage HLD with medications and dietary modifications using handouts and educational resources independently ?Outcome: Progressing ?Goal: RH STG INCREASE KNOWLEDGE OF STROKE PROPHYLAXIS ?Description: Patient will be able to manage secondary stroke risks with medications and dietary  modifications using handouts and educational resources independently ?Outcome: Progressing ?  ?

## 2021-06-03 ENCOUNTER — Ambulatory Visit: Payer: Medicare Other | Admitting: Student

## 2021-06-03 ENCOUNTER — Inpatient Hospital Stay (HOSPITAL_COMMUNITY): Payer: Medicare Other

## 2021-06-03 DIAGNOSIS — I951 Orthostatic hypotension: Secondary | ICD-10-CM

## 2021-06-03 DIAGNOSIS — D72829 Elevated white blood cell count, unspecified: Secondary | ICD-10-CM

## 2021-06-03 DIAGNOSIS — G9340 Encephalopathy, unspecified: Secondary | ICD-10-CM

## 2021-06-03 LAB — CBC
HCT: 46 % (ref 39.0–52.0)
Hemoglobin: 15 g/dL (ref 13.0–17.0)
MCH: 31.1 pg (ref 26.0–34.0)
MCHC: 32.6 g/dL (ref 30.0–36.0)
MCV: 95.2 fL (ref 80.0–100.0)
Platelets: 281 10*3/uL (ref 150–400)
RBC: 4.83 MIL/uL (ref 4.22–5.81)
RDW: 13.2 % (ref 11.5–15.5)
WBC: 10.7 10*3/uL — ABNORMAL HIGH (ref 4.0–10.5)
nRBC: 0 % (ref 0.0–0.2)

## 2021-06-03 LAB — COMPREHENSIVE METABOLIC PANEL
ALT: 18 U/L (ref 0–44)
AST: 22 U/L (ref 15–41)
Albumin: 3.4 g/dL — ABNORMAL LOW (ref 3.5–5.0)
Alkaline Phosphatase: 50 U/L (ref 38–126)
Anion gap: 10 (ref 5–15)
BUN: 21 mg/dL (ref 8–23)
CO2: 21 mmol/L — ABNORMAL LOW (ref 22–32)
Calcium: 10 mg/dL (ref 8.9–10.3)
Chloride: 107 mmol/L (ref 98–111)
Creatinine, Ser: 0.85 mg/dL (ref 0.61–1.24)
GFR, Estimated: >60 mL/min (ref >60)
Glucose, Bld: 169 mg/dL — ABNORMAL HIGH (ref 70–99)
Potassium: 4.1 mmol/L (ref 3.5–5.1)
Sodium: 138 mmol/L (ref 135–145)
Total Bilirubin: 0.8 mg/dL (ref 0.3–1.2)
Total Protein: 7.1 g/dL (ref 6.5–8.1)

## 2021-06-03 LAB — GLUCOSE, CAPILLARY
Glucose-Capillary: 158 mg/dL — ABNORMAL HIGH (ref 70–99)
Glucose-Capillary: 130 mg/dL — ABNORMAL HIGH (ref 70–99)
Glucose-Capillary: 200 mg/dL — ABNORMAL HIGH (ref 70–99)
Glucose-Capillary: 225 mg/dL — ABNORMAL HIGH (ref 70–99)

## 2021-06-03 LAB — URINE CULTURE: Culture: NO GROWTH

## 2021-06-03 LAB — VITAMIN B12: Vitamin B-12: 454 pg/mL (ref 180–914)

## 2021-06-03 LAB — AMMONIA: Ammonia: <10 umol/L (ref 9–35)

## 2021-06-03 MED ORDER — LACTATED RINGERS IV SOLN: INTRAVENOUS | Status: AC

## 2021-06-03 NOTE — Progress Notes (Signed)
Physical Therapy Session Note ? ?Patient Details  ?Name: Alan Rubio ?MRN: [id number] ?Date of Birth: 1931/04/06 ? ?Today's Date: 06/03/2021 ?PT Individual Time: 1540-0867 and 1410-1420 ?PT Individual Time Calculation (min): 27 min  and 10 min ?and  ?Today's Date: 06/03/2021 ?PT Missed Time: 50 Minutes ?Missed Time Reason: CT/MRI ? ?Short Term Goals: ?Week 2:  PT Short Term Goal 1 (Week 2): Pt will perform supine<>sit with min assist ?PT Short Term Goal 2 (Week 2): Pt will perform sit<>stands using LRAD with min assist ?PT Short Term Goal 3 (Week 2): Pt will perform bed<>chair transfers using LRAD with min assist ?PT Short Term Goal 4 (Week 2): Pt will ambulate at least 2f using LRAD with CGA ?PT Short Term Goal 5 (Week 2): Pt will initiate stair navigation ? ?Skilled Therapeutic Interventions/Progress Updates:  ?  Session 1: Pt received supine in bed resting with his son, DQuita Skye and wife, BHorris Latino present. With encouragement, pt agreeable to limited therapy session. Pt continues to state he doesn't feel well. Assessed vitals as below, pt NOT wearing TED hose nor abdominal binder at this time. Supine>sitting L EOB, HOB slightly elevated and using bedrail, with max/total assist for rolling onto L side and then bringing trunk upright - pt requiring increased time, cuing, and encouragement/motivation to initiate the task and motor plan sequencing - pt with poor effort towards movement likely due overwhelming feeling of fatigue. Once in sitting with B UE support on bedrails, requires max/total assist for static sitting balance due to posterior lean while also providing total assist to scoot hips towards EOB to provide feet support on ground - with increased time and facilitation for anterior trunk lean pt able to maintain static sitting with mod/max assist. Pt noted to be incontinent of bladder and bowels therefore returned to supine via reverse logroll technique with total assist multimodal cuing for sequencing and max  assist for trunk descent and B LE management onto bed. +2 dependent assist to scoot towards HOB. Pt left supine in bed with needs in reach, family present, and nursing staff notified of pt's need for hygiene assist.  ? ?Supine: BP 130/61 (MAP 80), HR 68bpm  ?Sitting EOB: BP 139/103 (MAP 115), HR 94bpm  ?Returned to supine: BP 157/70 (MAP 95), HR 79bpm  ? ?Session 2: Pt received supine in bed with his wife, BHorris Latino and son, DQuita Skye present. Per pt's family and nursing staff, transport services on their way to take patient to CT scan. Pt and family with questions regarding updated D/C recommendations with consideration of SNF given pt's recent decline in functional mobility and need for additional skilled therapy over longer duration of time to progress pt's independence with functional mobility. This therapist educating family on this option as able within scope of practice. Transport services arrived to take pt to imaging. Missed 50 minutes of skilled physical therapy. ? ?Therapy Documentation ?Precautions:  ?Precautions ?Precautions: Fall, Other (comment) ?Precaution Comments: HOH, orthostatic ?Restrictions ?Weight Bearing Restrictions: No ? ?Pain: ? Session 1: Continues to report pain in his neck when initiating coming to sit EOB. ? ?Session 2: No reports of pain throughout session. ? ? ? ?Therapy/Group: Individual Therapy ? ?CTawana Scale, PT, DPT, NCS, CSRS ?06/03/2021, 7:55 AM  ?

## 2021-06-03 NOTE — Assessment & Plan Note (Addendum)
Nurse aide in room and states recent orthostatic vitals from sitting to standing went from 194>17E systolic  ?Continue TED hose and abdominal binder ?I wonder if he has some volume depletion being changed to nectar liquids with limited intake ?-will give gentle, time limited IVF  ?-medication reviewed. He is on cardura '8mg'$ ; however, this has been decreased to '2mg'$  BID, consider holding  ?-would recommend to hold jardiance.  ?-reminders by nursing staff to drink nectar liquids ?-continue to check orthostatic vitals  ?-strict I/O  ? ?

## 2021-06-03 NOTE — Progress Notes (Signed)
Patient ID: Alan Rubio, male   DOB: 28-Aug-1931, 88 y.o.   MRN: [id number] ? ?Team Conference Report to Patient/Family ? ?Team Conference discussion was reviewed with the patient and caregiver, including goals, any changes in plan of care and target discharge date.  Patient and caregiver express understanding and are in agreement.  The patient has a target discharge date of 06/10/21. ? ?SW met with patient spouse and son and provided team conference updates. Family very tearful and upset, informed SW that they have spoken with the physician and they feel as they "expected too much progress". Sw informed the family of the SNF recommendation and the family is agreeable due to the patients LOC. Sw will provide the family with a list of skilled facilities to provide their preference. Sw will continue to follow up with the patient family. ? ?Dyanne Iha ?06/03/2021, 1:33 PM  ?

## 2021-06-03 NOTE — Assessment & Plan Note (Addendum)
88 year old male with recent history of left pontine CVA in inpatient rehab with new onset intermittent AMS ?-he is alert and oriented on my exam and following directions/conversing and per nursing has mild, intermittent confusion but ? If age appropriate.  ?-does not appear to be infectious at this time. Minimally elevated WBC, but no fever or source, f/u on urine culture ?-repeat cbc/cmp  ?-CXR clear, f/u on SLP swallow study to rule out aspiration ?-check metabolic labs including YELYHTM/B31. TSH wnl on 4/28.  ?-f/u on head CT to rule out possible new infarct  ?-asked nursing to check for pressure ulcer  ?

## 2021-06-03 NOTE — Progress Notes (Signed)
?                                                       PROGRESS NOTE ? ? ?Subjective/Complaints: ? ?Labs and xrays reviewed , unremarkable , pt  ? ?ROS: Limited due to cognitive/behavioral  ? ? ?Objective: ?  ?DG Chest 1 View ? ?Result Date: 06/02/2021 ?CLINICAL DATA:  Generalized abdominal pain, history stroke, diabetes mellitus, atrial fibrillation, hypertension EXAM: CHEST  1 VIEW COMPARISON:  Portable exam 1418 hours compared to 05/22/2021 FINDINGS: Loop recorder projects over lower LEFT chest. Normal heart size, mediastinal contours, and pulmonary vascularity. Atherosclerotic calcification aorta. Pleural calcifications bilaterally unchanged question asbestos exposure. No pulmonary infiltrate, pleural effusion, or pneumothorax. Bones demineralized. 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 No acute abnormalities. Aortic Atherosclerosis (ICD10-I70.0). Electronically Signed   By: Lavonia Dana M.D.   On: 06/02/2021 14:49  ? ?DG Abd 1 View ? ?Result Date: 06/02/2021 ?CLINICAL DATA:  Generalized abdominal pain. EXAM: ABDOMEN - 1 VIEW COMPARISON:  None Available. FINDINGS: No gaseous bowel dilatation to suggest obstruction. Small to moderate volume stool seen scattered along the length of the colon. Fiducial markers overlie the prostate bed. Degenerative changes noted lower lumbar spine. IMPRESSION: Nonobstructive bowel gas pattern. Electronically Signed   By: Misty Stanley M.D.   On: 06/02/2021 14:45   ?Recent Labs  ?  06/02/21 ?9150  ?WBC 11.3*  ?HGB 13.7  ?HCT 38.8*  ?PLT 268  ? ? ? ?Recent Labs  ?  06/01/21 ?5697 06/02/21 ?9480  ?NA  --  137  ?K  --  4.0  ?CL  --  109  ?CO2  --  21*  ?GLUCOSE  --  153*  ?BUN  --  23  ?CREATININE 0.85 0.81  ?CALCIUM  --  9.5  ? ? ? ?Intake/Output Summary (Last 24 hours) at 06/03/2021 0832 ?Last data filed at 06/03/2021 0400 ?Gross per 24 hour  ?Intake 1192.34 ml  ?Output --  ?Net 1192.34 ml  ? ?  ? ?  ? ?Physical Exam: ?Vital Signs ?Blood  pressure (!) 174/72, pulse 70, temperature 98.5 ?F (36.9 ?C), resp. rate 14, height 6' (1.829 m), weight 73.2 kg, SpO2 99 %. ? ? ?General: No acute distress ?Mood and affect are appropriate ?Heart: Regular rate and rhythm no rubs murmurs or extra sounds ?Lungs: Clear to auscultation, breathing unlabored, no rales or wheezes ?Abdomen: Positive bowel sounds, soft nontender to palpation, nondistended ?Extremities: No clubbing, cyanosis, or edema ? ?Musculoskeletal: Full range of motion in all 4 extremities. No joint swelling ? ? ?Assessment/Plan: ?1. Functional deficits which require 3+ hours per day of interdisciplinary therapy in a comprehensive inpatient rehab setting. ?Physiatrist is providing close team supervision and 24 hour management of active medical problems listed below. ?Physiatrist and rehab team continue to assess barriers to discharge/monitor patient progress toward functional and medical goals ? ?Care Tool: ? ?Bathing ?   ?Body parts bathed by patient: Right arm  ? Body parts bathed by helper: Right lower leg ?  ?  ?Bathing assist Assist Level: Minimal Assistance - Patient > 75% ?  ?  ?Upper Body Dressing/Undressing ?Upper body dressing   ?What is the patient wearing?: Pull over shirt ?   ?Upper body assist Assist Level: Minimal Assistance - Patient > 75% ?   ?Lower Body  Dressing/Undressing ?Lower body dressing ? ? ?   ?What is the patient wearing?: Incontinence brief ? ?  ? ?Lower body assist Assist for lower body dressing: Total Assistance - Patient < 25% ?   ? ?Toileting ?Toileting    ?Toileting assist Assist for toileting: Maximal Assistance - Patient 25 - 49% ?  ?  ?Transfers ?Chair/bed transfer ? ?Transfers assist ?   ? ?Chair/bed transfer assist level: Dependent - mechanical lift (stedy) ?Chair/bed transfer assistive device: Armrests, Walker ?  ?Locomotion ?Ambulation ? ? ?Ambulation assist ? ?   ? ?Assist level: Minimal Assistance - Patient > 75% ?Assistive device: Walker-rolling ?Max distance:  49f  ? ?Walk 10 feet activity ? ? ?Assist ? Walk 10 feet activity did not occur: Safety/medical concerns (required use of AD) ? ?  ?   ? ?Walk 50 feet activity ? ? ?Assist Walk 50 feet with 2 turns activity did not occur: Safety/medical concerns (required use of AD) ? ?  ?   ? ? ?Walk 150 feet activity ? ? ?Assist Walk 150 feet activity did not occur: Safety/medical concerns ? ?  ?  ?  ? ?Walk 10 feet on uneven surface  ?activity ? ? ?Assist Walk 10 feet on uneven surfaces activity did not occur: Safety/medical concerns ? ? ?  ?   ? ?Wheelchair ? ? ? ? ?Assist Is the patient using a wheelchair?: Yes (used for transport to/from gym) ?Type of Wheelchair: Manual ?  ? ?Wheelchair assist level: Dependent - Patient 0% ?   ? ? ?Wheelchair 50 feet with 2 turns activity ? ? ? ?Assist ? ?  ?  ? ? ?Assist Level: Dependent - Patient 0%  ? ?Wheelchair 150 feet activity  ? ? ? ?Assist ?   ? ? ?Assist Level: Dependent - Patient 0%  ? ?Blood pressure (!) 174/72, pulse 70, temperature 98.5 ?F (36.9 ?C), resp. rate 14, height 6' (1.829 m), weight 73.2 kg, SpO2 99 %. ? ?1. 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            -patient may shower ?            -ELOS/Goals: 5/17 ?            -Continue CIR therapies including PT, OT, and SLP   ?2.  Antithrombotics: ?-DVT/anticoagulation:  Pharmaceutical: Lovenox ?            -antiplatelet therapy: Aspirin 81 mg daily and Plavix 75 mg daily x3 weeks then Plavix alone ?Plavix alone as of 5/19 ?3. Pain Management: Tylenol as needed ?4. Mood: Melatonin 1.5 mg nightly provide emotional support ?            -antipsychotic agents: N/A ?5. Neuropsych: This patient is capable of making decisions on his own behalf. ?6. Skin/Wound Care: Routine skin checks ?7. Fluids/Electrolytes/Nutrition:  Routine in and outs with follow-up chemistries ?BMET ok, per family poor appetite at home  ? ?  Latest Ref Rng & Units 06/02/2021  ?  5:49 AM 06/01/2021  ?  5:35 AM 05/31/2021  ?  5:05 AM  ?BMP  ?Glucose 70 - 99 mg/dL 153    141    ?BUN 8 - 23 mg/dL 23    23    ?Creatinine 0.61 - 1.24 mg/dL 0.81   0.85  0.79    ?Sodium 135 - 145 mmol/L 137    140    ?Potassium 3.5 - 5.1 mmol/L 4.0    4.2    ?Chloride 98 - 111 mmol/L 109    109    ?CO2 22 - 32 mmol/L 21    22    ?Calcium 8.9 - 10.3 mg/dL 9.5    9.7    ?  ?8.  Type 2 diabetes mellitus.  Hemoglobin A1c 7.7.  Jardiance 25 mg daily as well as SSI.  Actos 15 mg daily on hold resumed 5/5 ?CBG (last 3)  ? ?Recent Labs  ?  06/02/21 ?1642 06/02/21 ?2056 06/03/21 ?0610  ?GLUCAP 258* 106* 158*  ? ? 5/9 elevated , increase actos  ? ?9.  Hypertension.  Well controlled, continue Cozaar 50 mg daily, Cardura 4 mg daily ?Vitals:  ? 06/02/21 1938 06/03/21 0450  ?BP: (!) 172/85 (!) 174/72  ?Pulse: 75 70  ?Resp: 14 14  ?Temp: 98.9 ?F (37.2 ?C) 98.5 ?F (36.9 ?C)  ?SpO2: 99% 99%  ?Cardura changed to '2mg'$  Q 12 due to lability   ?Increase Cozaar on 5/9 to '75mg'$  qam, monitor effect  ?10.  Hyperlipidemia.  Continue Lipitor 40 mg daily, Zetia 10 mg daily ?11.  History of gout.  Continue Zyloprim 300 mg daily.  Monitor for any gout flareups ?12.  Hypothyroidism.  Continue Synthroid ? 13.  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  Freq small smears per RN, may need enema for retained stool , negative KUB ?LOS: ?9 days ?A FACE TO FACE EVALUATION WAS PERFORMED ? ?Luanna Salk Shyanna Klingel ?06/03/2021, 8:32 AM  ? ? ? ?

## 2021-06-03 NOTE — Assessment & Plan Note (Signed)
Likely reactive/concentrated. No signs of infection or fever ?F/u on urine culture, IVF ?Monitor fever curve, trend cbc  ?

## 2021-06-03 NOTE — Assessment & Plan Note (Signed)
Recent A1C of 7.7 ?Recommend hold jardiance with hx of orthostatic hypotension ?Continue actos/SSI  ?

## 2021-06-03 NOTE — Assessment & Plan Note (Signed)
F/u CT head ?Per rehab physician ?

## 2021-06-03 NOTE — Assessment & Plan Note (Signed)
TSH recently checked on 4/28 and wnl ?Continue synthroid at 45mg daily  ?

## 2021-06-03 NOTE — Patient Care Conference (Signed)
Inpatient RehabilitationTeam Conference and Plan of Care Update ?Date: 06/03/2021   Time: 10:37 AM  ? ? ?Patient Name: Alan Rubio      ?Medical Record Number: [id number]  ?Date of Birth: 03/14/1931 ?Sex: Male         ?Room/Bed: 4M09C/4M09C-01 ?Payor Info: Payor: MEDICARE / Plan: MEDICARE PART A AND B / Product Type: *No Product type* /   ? ?Admit Date/Time:  05/25/2021  3:54 PM ? ?Primary Diagnosis:  Acute encephalopathy ? ?Hospital Problems: Principal Problem: ?  Acute encephalopathy ?Active Problems: ?  Type 2 diabetes mellitus ([place]) ?  Hypothyroidism ?  Left pontine cerebrovascular accident [facility]) ?  Orthostatic hypotension ?  Leukocytosis ? ? ? ?Expected Discharge Date: Expected Discharge Date: 06/10/21 ? ?Team Members Present: ?Physician leading conference: Dr. Alysia Penna ?Social Worker Present: Erlene Quan, BSW ?Nurse Present: Dorien Chihuahua, RN ?PT Present: Page Spiro, PT ?OT Present: Precious Haws, COTA;Jennifer Gorman, OT ?SLP Present: Sherren Kerns, SLP ?PPS Coordinator present : Gunnar Fusi, SLP ? ?   Current Status/Progress Goal Weekly Team Focus  ?Bowel/Bladder ? ? continent with some incontince to urine LBM 5/6         ?Swallow/Nutrition/ Hydration ? ? dys 2 diet, thin liquids, meds whole one at a time per MBS today  mod I - goal will be downgraded to sup A d/t fatigue and cognitive deficits  Tolerance of current diet with minimal s/sx of aspiration   ?ADL's ? ? total A- MAX A for UB/LB ADLS from EOB, total A for bed mobility, MOD/MAX for transfers. recent decline in function seemed to have started over the weekend, c/o neck pain, drop in BP with mobility, decreased level of arousal, slow processing  CGA - Supervision  acitivy tolerance, endurance, BADL reeducation   ?Mobility ? ? recent decline in functional mobility level 3 days ago now requiring total assist for bed mobility, mod/max assist for sit<>stand transfers using RW transitioned to min/mod assist for balance while turning with  RW - pt has not ambulated since 5/4 - pt now presents with increased lethargy and more delayed processing and initiation as well as having labile BP  supervision overall at household level in order to D/C home with his wife's support  activity tolerance, pt/family education, bed mobility retraining, transfer training, gait training, DME training   ?Communication ? ? mod A for speech intelligiblity; limited by breath support and vocal hoarseness  min A  speech intelligiblity strategies; RMT   ?Safety/Cognition/ Behavioral Observations ? mod A  min A  orientation, functional recall, sustained attention, problem solving   ?Pain ? ? denies pain  denies pain      ?Skin ? ? skin tear left forarm  wound to show signs of healing      ? ? ?Discharge Planning:  ?Discharging home with son able to provide Min A. 24/7 support from spouse and son   ?Team Discussion: ?Patient post multi infarct dementia and little reserve, and poor tolerance to change. Patient with poor appetite, and right LE weakness. Patient has had a functional decline since the weekend with neck pain and orthostasis.  ?Patient on target to meet rehab goals: ?Currently needs total assist for ADLs and mod - max assist for transfers when he was min assist on eval. Needs mod assist for speech intelligibility  ? ?*See Care Plan and progress notes for long and short-term goals.  ? ?Revisions to Treatment Plan:  ?Downgraded goals and MD adjusting meds  ?RMS training ?MBS to  eval swallowing delay ? ?Teaching Needs: ?Safety, swallowing instructions, medication administration ( 1 at a time in puree with follow up bolus of puree, liquids in cup with straws; not in cup solo. etc  ?Current Barriers to Discharge: ?Decreased caregiver support ? ?Possible Resolutions to Barriers: ?Family education ?SNF recommended or private pay assistance for wife ?DME RW ?HH follow up services ?  ? ? Medical Summary ?Current Status: multi infarct dementia with chronic anorexia, BP  uncontroled ? Barriers to Discharge: Medical stability ?  ?Possible Resolutions to [employer] Focus: MID, BP med management , reduce therapy hours ? ? ?Continued Need for Acute Rehabilitation Level of Care: The patient requires daily medical management by a physician with specialized training in physical medicine and rehabilitation for the following reasons: ?Direction of a multidisciplinary physical rehabilitation program to maximize functional independence : Yes ?Medical management of patient stability for increased activity during participation in an intensive rehabilitation regime.: Yes ?Analysis of laboratory values and/or radiology reports with any subsequent need for medication adjustment and/or medical intervention. : Yes ? ? ?I attest that I was present, lead the team conference, and concur with the assessment and plan of the team. ? ? ?Dorien Chihuahua B ?06/03/2021, 5:23 PM  ? ? ? ? ? ? ?

## 2021-06-03 NOTE — [place] FL2 (Signed)
?[place] MEDICAID FL2 LEVEL OF CARE SCREENING TOOL  ?  ? ?IDENTIFICATION  ?Patient Name: ?Alan Rubio Birthdate: 1931-02-27 Sex: male Admission Date (Current Location): ?05/25/2021  ?[place] and [place] Number: ? [place] ?  Facility and Address:  ?[facility] [place], [place] [address] [place], [place] [address] ?     Provider Number: ?   ?Attending Physician Name and Address:  ?Charlett Blake, MD ? Relative Name and Phone Number:  ?Dan Humphreys) [phone] ?   ?Current Level of Care: ?  Recommended Level of Care: ?[place] Prior Approval Number: ?  ? ?Date Approved/Denied: ?  PASRR Number: ?[id number] A ? ?Discharge Plan: ?SNF ?  ? ?Current Diagnoses: ?Patient Active Problem List  ? Diagnosis Date Noted  ? Acute encephalopathy 06/03/2021  ? Orthostatic hypotension 06/03/2021  ? Leukocytosis 06/03/2021  ? Left pontine cerebrovascular accident ([place]) 05/25/2021  ? CVA (cerebral vascular accident) ([place]) 05/22/2021  ? Encounter for loop recorder check 09/15/2018  ? 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 Hyperlipemia 06/13/2018  ? Hypothyroidism 06/13/2018  ? H/O: stroke 04/14/2018  ? Anemia 04/14/2018  ? HTN (hypertension) 04/14/2018  ? Gout 04/14/2018  ? Type 2 diabetes mellitus ([place]) 04/14/2018  ? ? ?Orientation RESPIRATION BLADDER Height & Weight   ?  ?  ? Normal Continent Weight: 161 lb 6 oz (73.2 kg) ?Height:  6' (182.9 cm)  ?BEHAVIORAL SYMPTOMS/MOOD NEUROLOGICAL BOWEL NUTRITION STATUS  ?    Continent Diet  ?AMBULATORY STATUS COMMUNICATION OF NEEDS Skin   ?Total Care Verbally Normal ?  ?  ?  ?    ?     ?     ? ? ?Personal Care Assistance Level of Assistance  ?Bathing, Dressing, Feeding Bathing Assistance: Maximum assistance ?Feeding assistance: Maximum assistance ?Dressing Assistance: Maximum assistance ?   ? ?Functional Limitations Info  ?Sight, Speech Sight Info: Impaired ?Hearing Info: Impaired ?Speech Info: Adequate  ? ? ?SPECIAL CARE FACTORS  FREQUENCY  ?PT (By licensed PT), OT (By licensed OT), Speech therapy   ?  ?PT Frequency: 5x a week ?OT Frequency: 5x a week ?  ?  ?Speech Therapy Frequency: 5x a week ?   ? ? ?Contractures    ? ? ?Additional Factors Info  ?Code Status Code Status Info: full ?  ?  ?  ?  ?   ? ?Current Medications (06/03/2021):  This is the current hospital active medication list ?Current Facility-Administered Medications  ?Medication Dose Route Frequency Provider Last Rate Last Admin  ? 0.45 % sodium chloride infusion   Intravenous Continuous Kirsteins, Luanna Salk, MD 75 mL/hr at 06/02/21 1830 Infusion Verify at 06/02/21 1830  ? acetaminophen (TYLENOL) tablet 650 mg  650 mg Oral Q4H PRN Cathlyn Parsons, PA-C   650 mg at 06/01/21 1639  ? Or  ? acetaminophen (TYLENOL) 160 MG/5ML solution 650 mg  650 mg Per Tube Q4H PRN Angiulli, Lavon Paganini, PA-C      ? Or  ? acetaminophen (TYLENOL) suppository 650 mg  650 mg Rectal Q4H PRN Angiulli, Lavon Paganini, PA-C      ? allopurinol (ZYLOPRIM) tablet 300 mg  300 mg Oral Daily Cathlyn Parsons, PA-C   300 mg at 06/03/21 7169  ? aspirin EC tablet 81 mg  81 mg Oral Daily Cathlyn Parsons, PA-C   81 mg at 06/03/21 6789  ? atorvastatin (LIPITOR) tablet 40 mg  40 mg Oral Daily Cathlyn Parsons, PA-C   40  mg at 06/03/21 0817  ? clopidogrel (PLAVIX) tablet 75 mg  75 mg Oral Daily Cathlyn Parsons, PA-C   75 mg at 06/03/21 3329  ? diclofenac Sodium (VOLTAREN) 1 % topical gel 2 g  2 g Topical QID Luetta Nutting, FNP   2 g at 06/03/21 1548  ? docusate sodium (COLACE) capsule 100 mg  100 mg Oral BID Cathlyn Parsons, PA-C   100 mg at 06/03/21 5188  ? doxazosin (CARDURA) tablet 2 mg  2 mg Oral Q12H Kirsteins, Luanna Salk, MD   2 mg at 06/03/21 4166  ? empagliflozin (JARDIANCE) tablet 25 mg  25 mg Oral Daily Cathlyn Parsons, PA-C   25 mg at 06/03/21 0630  ? enoxaparin (LOVENOX) injection 40 mg  40 mg Subcutaneous Q24H Cathlyn Parsons, PA-C   40 mg at 06/02/21 2047  ? ezetimibe (ZETIA) tablet 10 mg  10 mg Oral  Daily Cathlyn Parsons, PA-C   10 mg at 06/03/21 1601  ? insulin aspart (novoLOG) injection 0-9 Units  0-9 Units Subcutaneous TID WC AngiulliLavon Paganini, PA-C   1 Units at 06/03/21 1202  ? lactated ringers infusion   Intravenous Continuous Orma Flaming, MD 75 mL/hr at 06/03/21 1544 New Bag at 06/03/21 1544  ? levothyroxine (SYNTHROID) tablet 88 mcg  88 mcg Oral QAC breakfast Cathlyn Parsons, PA-C   88 mcg at 06/03/21 0505  ? losartan (COZAAR) tablet 75 mg  75 mg Oral Daily Charlett Blake, MD   75 mg at 06/03/21 0932  ? megestrol (MEGACE) 400 MG/10ML suspension 400 mg  400 mg Oral BID Charlett Blake, MD   400 mg at 06/03/21 3557  ? melatonin tablet 1.5 mg  1.5 mg Oral QHS AngiulliLavon Paganini, PA-C   1.5 mg at 06/02/21 2048  ? multivitamin with minerals tablet 1 tablet  1 tablet Oral Daily Charlett Blake, MD   1 tablet at 06/03/21 3220  ? nystatin (MYCOSTATIN/NYSTOP) topical powder   Topical BID Luetta Nutting, FNP   Given at 06/03/21 [id number]  ? pioglitazone (ACTOS) tablet 30 mg  30 mg Oral Daily Charlett Blake, MD   30 mg at 06/03/21 7062  ? polyethylene glycol (MIRALAX / GLYCOLAX) packet 17 g  17 g Oral Daily Cathlyn Parsons, PA-C   17 g at 06/03/21 3762  ? sertraline (ZOLOFT) tablet 25 mg  25 mg Oral Daily Charlett Blake, MD   25 mg at 06/03/21 0816  ? sorbitol 70 % solution 30 mL  30 mL Oral Daily PRN Luetta Nutting, FNP   30 mL at 06/01/21 0055  ? ? ? ?Discharge Medications: ?Please see discharge summary for a list of discharge medications. ? ?Relevant Imaging Results: ? ?Relevant Lab Results: ? ? ?Additional Information ?[id number] ? ?Dyanne Iha ? ? ? ? ?

## 2021-06-03 NOTE — Consult Note (Signed)
Initial Consultation Note ? ? ?Patient: Alan Rubio SPQ:[id number] DOB: 1931-11-25 PCP: [name], MD ?DOA: 05/25/2021 ?DOS: the patient was seen and examined on 06/03/2021 ?Primary service: Charlett Blake, MD ? ?Referring physician: Dr. Letta Pate ?Reason for consult: AMS/orthostatic hypotension  ? ?Assessment/Plan: ?Assessment and Plan: ?* Acute encephalopathy ?88 year old male with recent history of left pontine CVA in inpatient rehab with new onset intermittent AMS ?-he is alert and oriented on my exam and following directions/conversing and per nursing has mild, intermittent confusion but ? If age appropriate.  ?-does not appear to be infectious at this time. Minimally elevated WBC, but no fever or source, f/u on urine culture ?-repeat cbc/cmp  ?-CXR clear, f/u on SLP swallow study to rule out aspiration ?-check metabolic labs including [id number]. TSH wnl on 4/28.  ?-f/u on head CT to rule out possible new infarct  ?-asked nursing to check for pressure ulcer  ? ?Orthostatic hypotension ?Nurse aide in room and states recent orthostatic vitals from sitting to standing went from 389>37D systolic  ?Continue TED hose and abdominal binder ?I wonder if he has some volume depletion being changed to nectar liquids with limited intake ?-will give gentle, time limited IVF  ?-medication reviewed. He is on cardura '8mg'$ ; however, this has been decreased to '2mg'$  BID, consider holding  ?-would recommend to hold jardiance.  ?-reminders by nursing staff to drink nectar liquids ?-continue to check orthostatic vitals  ?-strict I/O  ? ? ?Hypothyroidism ?TSH recently checked on 4/28 and wnl ?Continue synthroid at 37mg daily  ? ?[facility] ?F/u CT head ?Per rehab physician ? ?[place] 2 [place] ?Recent A1C of 7.7 ?Recommend hold jardiance with hx of orthostatic hypotension ?Continue actos/SSI  ? ?Leukocytosis ?Likely reactive/concentrated. No signs of infection or fever ?F/u on urine  culture, IVF ?Monitor fever curve, trend cbc  ? ? ? ?TRH will continue to follow the patient. ? ?HPI: Alan JBennettis a 88y.o. male with past medical history of atrial fibrillation, T2DM, HTN, HLD, hypothyroidism, and hx of CVA in 03/2018 and recent left pontine infarct likely secondary to small vessel disease who is currently in inpatient rehab. TRH was called today by PMR to consult due to recent decline with ADLs and intermittent AMS of patient. He was also found to be symptomatic with orthostatic hypotension despite TED hose and abdominal binder.  Dr. KLetta Patestates that last week patient needed minimal assist in therapies/ADLs and now has had a functional decline with some confusion. He is requiring 2+ assist and having a hard time completing 3 hours of therapy. Work up has included normal KUB and CXR, UA with culture pending, CT head pending and IVF. Lab work unremarkable. ? ?He is sitting up in a chair eating. He is alert and oriented and follows all directions. He denies any fever/chills, but states "I feel weak all over." He denies any coughing, shortness of breath, chest pain or palpitations, stomach pain, N/V/D. He does state his bottom hurts from being constipated, but had a normal BM yesterday. Denies any urinary frequency/urgency or dysuria.  ? ? ? ?Review of Systems: As mentioned in the history of present illness. All other systems reviewed and are negative. ?Past Medical History:  ?Diagnosis Date  ? A-fib (Alan Rubio   ? Cancer (Alan Rubio   ? Diabetes mellitus without complication (Alan Rubio   ? Encounter for loop recorder check 09/15/2018  ? H/O: stroke 04/14/2018  ? Hyperlipemia 06/13/2018  ? Hypertension   ? Hypothyroidism  06/13/2018  ? 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 Remote loop recorder check  9.15.20: 1 false AF=SB/1st deg AVB/PACs  ? Stroke Alan Rubio)   ? Thyroid disease   ? ?Past Surgical History:  ?Procedure Laterality Date  ? BACK SURGERY    ? LOOP RECORDER IMPLANT    ? ?Social  History:  reports that he has never smoked. He has never used smokeless tobacco. He reports current alcohol use. He reports that he does not use drugs. ? ?Allergies  ?Allergen Reactions  ? Penicillins Rash  ?  Did it involve swelling of the face/tongue/throat, SOB, or low BP? Yes ?Did it involve sudden or severe rash/hives, skin peeling, or any reaction on the inside of your mouth or nose? No ?Did you need to seek medical attention at a Rubio or doctor's office? No ?When did it last happen?       ?If all above answers are "NO", may proceed with cephalosporin use. ? ? ?  ? ? ?Family History  ?Problem Relation Age of Onset  ? Stroke Mother   ? Aneurysm Father   ? Cancer Brother   ? ? ?Prior to Admission medications   ?Medication Sig Start Date End Date Taking? Authorizing Provider  ?ACCU-CHEK AVIVA PLUS test strip daily. 08/17/19   [provider]  ?allopurinol (ZYLOPRIM) 300 MG tablet Take 300 mg by mouth daily.    [provider]  ?aspirin EC 81 MG tablet Take 81 mg by mouth daily.    [provider]  ?doxazosin (CARDURA) 8 MG tablet Take 4 mg by mouth daily. 1/2 QD    [provider]  ?empagliflozin (JARDIANCE) 25 MG TABS tablet Take by mouth daily.    [provider]  ?ezetimibe (ZETIA) 10 MG tablet Take 10 mg by mouth daily.    [provider]  ?levothyroxine (SYNTHROID) 88 MCG tablet Take 88 mcg by mouth daily before breakfast.    [provider]  ?losartan (COZAAR) 50 MG tablet Take 50 mg by mouth daily.    [provider]  ?Melatonin 10 MG TABS Take 1 tablet by mouth at bedtime.    [provider]  ?pioglitazone (ACTOS) 15 MG tablet Take 15 mg by mouth daily.    [provider]  ? ? ?Physical Exam: ? ?Vitals:  ? 06/03/21 1012 06/03/21 1135 06/03/21 1145 06/03/21 1159  ?BP: 130/61 (!) 145/71 (!) 147/84 (!) 145/82  ?Pulse: 68 69 83 79  ?Resp:  '18 17 18  '$ ?Temp:      ?TempSrc:      ?SpO2:  98% 98% 96%  ?Weight:       ?Height:      ? ?Physical Exam ?Vitals reviewed.  ?Constitutional:   ?   General: He is not in acute distress. ?   Appearance: Normal appearance.  ?HENT:  ?   Head: Normocephalic and atraumatic.  ?   Ears:  ?   Comments: Hearing aides bilaterally  ?Eyes:  ?   Extraocular Movements: Extraocular movements intact.  ?   Pupils: Pupils are equal, round, and reactive to light.  ?Neck:  ?   Vascular: No carotid bruit.  ?Cardiovascular:  ?   Rate and Rhythm: Normal rate and regular rhythm.  ?   Heart sounds: Normal heart sounds. No murmur heard. ?Pulmonary:  ?   Effort: Pulmonary effort is normal.  ?   Breath sounds: Normal breath sounds.  ?Abdominal:  ?   General: Abdomen is  flat. Bowel sounds are normal.  ?   Palpations: Abdomen is soft.  ?Musculoskeletal:  ?   Cervical back: Normal range of motion and neck supple.  ?Neurological:  ?   General: No focal deficit present.  ?   Mental Status: He is alert and oriented to person, place, and time.  ?   Cranial Nerves: No cranial nerve deficit.  ?   Motor: Weakness (BLE: 2/5 BUE: 4/5) present.  ?Psychiatric:     ?   Mood and Affect: Mood normal.     ?   Behavior: Behavior normal.  ?  ?Data Reviewed:  ? ?CXR with no acute findings.   KUB: small to moderate volume stool. Non obstructive gas pattern.  ?Labs: WBC: 11.3 ? ?Family Communication: none  ?Primary team communication: Discussed with Dr. Letta Pate  ?Thank you very much for involving [place] in the care of your patient. ? ?Author: ?Orma Flaming, MD ?06/03/2021 1:36 PM ? ?For on call review [email or web address].  ?

## 2021-06-03 NOTE — Progress Notes (Addendum)
Occupational Therapy Session Note ? ?Patient Details  ?Name: Alan Rubio ?MRN: [id number] ?Date of Birth: 30-Aug-1931 ? ?Today's Date: 06/03/2021 ?OT Individual Time: 8315-1761 ?OT Individual Time Calculation (min): 53 min  ? ? ?Short Term Goals: ?Week 2:  OT Short Term Goal 1 (Week 2): Pt will complete sit<>stand using RW with min assist in prep for LB ADLs ?OT Short Term Goal 2 (Week 2): Pt will complete stand pivot from slightly elevated surface <> BSC with mod assist in prep for toileting ?OT Short Term Goal 3 (Week 2): Pt will pull up and pants over hips while standing with min assist. ? ?Skilled Therapeutic Interventions/Progress Updates:  ? Pt greeted supine in bed initially declining session but with encouragement pt eventually agreeable to OT intervention. Donned pants from supine with MOD A for rolling R<>L and total A to don pants and pull to waist line from sidelying. ?Pt needed MAX A for supine>sit to L side of bed. Pt needed MIN A for static sitting balance d/t posterior lean, pt limited by [facility] see vitals below even with thigh high TEDS and ABD binder donned: ?Supine- 145/71 ( 93) HR 69 ?EOB- 147/84 ( 100) HR 85 ?Standing- 97/59 ( 71) pt reports dizziness ?Recliner with legs elevated- 145/82 ( 101) HR 81 ? ?Pt needed MAX A +2 to stand to stedy, total A transport to recliner, pt was abel to stand from seat of stedy with CGA and +time.  ?Pt left semireclined in recliner with legs elevated and all needs within reach.                   ? ?Therapy Documentation ?Precautions:  ?Precautions ?Precautions: Fall, Other (comment) ?Precaution Comments: HOH, orthostatic ?Restrictions ?Weight Bearing Restrictions: No ? ?Pain: generalized pain with all movement, provided rest breaks as needed.  ? ?Therapy/Group: Individual Therapy ? ?Precious Haws ?06/03/2021, 12:32 PM ?

## 2021-06-03 NOTE — Progress Notes (Signed)
Speech Language Pathology Daily Session Note ? ?Patient Details  ?Name: Alan Rubio ?MRN: [id number] ?Date of Birth: [birth year] ? ?Today's Date: 06/03/2021 ?SLP Individual Time: 2952-8413 ?SLP Individual Time Calculation (min): 30 min ? ?Short Term Goals: ?Week 2: SLP Short Term Goal 1 (Week 2): Patient will consume current diet with minimal overt s/s of aspiration and min A cues for safe swallowing precautions/strategies ?SLP Short Term Goal 2 (Week 2): Patient will orient x4 with sup-to-min A for use of external aids ?SLP Short Term Goal 3 (Week 2): Patient will complete basic-to-mildly complex problem solving tasks with min A verbal/visual cues ?SLP Short Term Goal 4 (Week 2): Patient will utilize external memory aids to increase functional recall with min-to-mod A verbal/visual cues ?SLP Short Term Goal 5 (Week 2): Patient will implement speech intelligiblity strategies at the phrase/sentence level with min-to-mod A verbal cues to reach 75% intelligiblity ? ?Skilled Therapeutic Interventions: Skilled ST treatment focused on education pt/family regarding MBS results. SLP provided verbal and written education on MBS results, diet recommendations, and safe swallowing precautions/strategies. Pt is recommended a dysphagia 2 diet with thin liquids by straw only; pills whole in puree and given one at a time with additional presentations of applesauce/pudding to facilitate pharyngeal clearance. Family advised to avoid pt tilting head back with liquid consumption to minimize posterior spillage into pharynx, as well as educated on positioning from bed/chair during meals. SLP may progress diet as able/tolerated from bedside. All questions were addressed. Proceed with current POC. Patient was left in bed with alarm activated and immediate needs within reach at end of session. SW arrived at end of session for further information on SNF. ?   ?Pain ? None/denied ? ?Therapy/Group: Individual Therapy ? ?Ellisa Devivo T  Monty Spicher ?06/03/2021, 4:40 PM ?

## 2021-06-03 NOTE — Progress Notes (Addendum)
Modified Barium Swallow Progress Note ? ?Patient Details  ?Name: Jevaun Strick ?MRN: [id number] ?Date of Birth: April 09, 1931 ? ?Today's Date: 06/03/2021 ? ?Modified Barium Swallow completed.  Full report located under Chart Review in the Imaging Section. ? ?Brief recommendations include the following: ? ?Clinical Impression ? Pt presents with a mild oropharyngeal dysphagia in setting of recent CVA. Oral phase c/b generalized lingual weakness resulting in delayed oral transit and premature spillage of bolus over the base of tongue into pyriform sinuses before the swallow with thin liquids, nectar thick liquids, and minimally with pureed consistencies. This was most significant with large cup sip of nectar thick liquids when pt?s head elevated upward while taking sip. Amount of premature spillage was reduced when liquids were given by straw which also allowed pt to maintain head in neutral and slightly downward position. Pt also exhibited impaired mastication which appeared exacerbated by lack of dentures during time of study. Pharyngeal phase c/b reduced base of tongue retraction, hyolaryngeal excursion impacting epiglottic inversion, and delayed swallow initiation with head of bolus at the pyriform sinuses. Deficits resulted in mild residue in the valleculae and pyriform sinuses with all consistencies which were effectively cleared with secondary dry swallow. Pt sensed and initiated second swallows independently. There were two instances of flash penetration with consecutive straw sips of thin liquids however ejected from airway. No aspiration events occurred before/during/after the swallow under fluoro. Pt was given barium tablet with puree and exhibited delayed oral transit and retention of tablet in the valleculae. An additional tsp of puree facilitated effective clearance of tablet. Of note, there was presence of suspected cervical osteophytes (no radiologist present to confirm). This did not negatively impact  transit/clearance of bolus.    ? ?Given these instrumental findings, SLP recommends pt consume a Dysphagia 2 diet with thin liquids; liquids by straw only. Pt may take pills whole in puree when given one at a time. It is strongly advised pt receive subsequent tsp(s) of puree to aid pharyngeal clearance. Pt requires full supervision with all PO intake to facilitate and ensure adherence to safe swallowing precautions and strategies. Pt/family have been educated with findings and verbalized agreement with recommendations. MD and Nurse were also informed. ?  ?Swallow Evaluation Recommendations ? ? ? SLP Diet Recommendations: Dysphagia 2 (Fine chop) solids;Thin liquid ? ? Liquid Administration via: Straw ? ? Medication Administration: Whole meds with puree ? ? Supervision: Full supervision/cueing for compensatory strategies;Staff to assist with self feeding ? ? Compensations: Minimize environmental distractions;Slow rate;Small sips/bites ? ? Postural Changes: Seated upright at 90 degrees;Remain semi-upright after after feeds/meals (Comment) ? ? Oral Care Recommendations: Oral care BID ? ?   ? ?Damante Spragg T Vearl Aitken ?06/03/2021,5:51 PM ?

## 2021-06-03 NOTE — Progress Notes (Signed)
Patient ID: Alan Rubio, male   DOB: [birth year], 88 y.o.   MRN: [id number] ? ?List of SNF options provided to patient family ?

## 2021-06-03 NOTE — Plan of Care (Signed)
Pt's plan of care adjusted to 15/7 after speaking with care team and discussion with MD in team conference as pt unable to tolerate current therapy schedule with OT, PT, and SLP.  ?

## 2021-06-04 DIAGNOSIS — K5900 Constipation, unspecified: Secondary | ICD-10-CM

## 2021-06-04 DIAGNOSIS — E1159 Type 2 diabetes mellitus with other circulatory complications: Secondary | ICD-10-CM

## 2021-06-04 DIAGNOSIS — I951 Orthostatic hypotension: Secondary | ICD-10-CM

## 2021-06-04 DIAGNOSIS — G9340 Encephalopathy, unspecified: Secondary | ICD-10-CM

## 2021-06-04 LAB — GLUCOSE, CAPILLARY
Glucose-Capillary: 148 mg/dL — ABNORMAL HIGH (ref 70–99)
Glucose-Capillary: 228 mg/dL — ABNORMAL HIGH (ref 70–99)
Glucose-Capillary: 231 mg/dL — ABNORMAL HIGH (ref 70–99)
Glucose-Capillary: 239 mg/dL — ABNORMAL HIGH (ref 70–99)

## 2021-06-04 MED ORDER — LACTATED RINGERS IV SOLN: INTRAVENOUS | Status: AC

## 2021-06-04 NOTE — Discharge Summary (Signed)
Physician Discharge Summary  ?Patient ID: ?Alan Rubio ?MRN: [id number] ?DOB/AGE: [birth year] 86 y.o. ? ?Admit date: 05/25/2021 ?Discharge date: 06/09/21 ? ?Discharge Diagnoses:  ?Principal Problem: ?  Acute encephalopathy ?Active Problems: ?  Type 2 diabetes mellitus ([place]) ?  Hypothyroidism ?  Left pontine cerebrovascular accident [facility]) ?  Orthostatic hypotension ?  Leukocytosis ?  Constipation ?  Hyperlipidemia ?   History of gout ?   Atrial fibrillation ? ? ?Discharged Condition: Stable ? ?Significant Diagnostic Studies: ?DG Chest 1 View ? ?Result Date: 06/02/2021 ?CLINICAL DATA:  Generalized abdominal pain, history stroke, diabetes mellitus, atrial fibrillation, hypertension EXAM: CHEST  1 VIEW COMPARISON:  Portable exam 1418 hours compared to 05/22/2021 FINDINGS: Loop recorder projects over lower LEFT chest. Normal heart size, mediastinal contours, and pulmonary vascularity. Atherosclerotic calcification aorta. Pleural calcifications bilaterally unchanged question asbestos exposure. No pulmonary infiltrate, pleural effusion, or pneumothorax. Bones demineralized. 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 No acute abnormalities. Aortic Atherosclerosis (ICD10-I70.0). Electronically Signed   By: Alan Rubio M.D.   On: 06/02/2021 14:49  ? ?DG Abd 1 View ? ?Result Date: 06/02/2021 ?CLINICAL DATA:  Generalized abdominal pain. EXAM: ABDOMEN - 1 VIEW COMPARISON:  None Available. FINDINGS: No gaseous bowel dilatation to suggest obstruction. Small to moderate volume stool seen scattered along the length of the colon. Fiducial markers overlie the prostate bed. Degenerative changes noted lower lumbar spine. IMPRESSION: Nonobstructive bowel gas pattern. Electronically Signed   By: Alan Rubio M.D.   On: 06/02/2021 14:45  ? ?CT HEAD WO CONTRAST (5MM) ? ?Result Date: 06/03/2021 ?CLINICAL DATA:  Altered mental status. EXAM: CT HEAD WITHOUT CONTRAST TECHNIQUE: Contiguous axial images were  obtained from the base of the skull through the vertex without intravenous contrast. RADIATION DOSE REDUCTION: This exam was performed according to the departmental dose-optimization program which includes automated exposure control, adjustment of the mA and/or kV according to patient size and/or use of iterative reconstruction technique. COMPARISON:  MRI brain and CT head dated May 22, 2021. FINDINGS: Brain: No evidence of acute infarction, hemorrhage, hydrocephalus, extra-axial collection or mass lesion/mass effect. Ill-defined hypodensity in the left pons corresponding to known subacute infarct. Remote lacunar infarct in the left thalamus again noted. Similar small area of encephalomalacia in the right frontal lobe. Stable atrophy and chronic microvascular ischemic changes. Vascular: Calcified atherosclerosis at the skull base. No hyperdense vessel. Skull: Normal. Negative for fracture or focal lesion. Sinuses/Orbits: No acute finding. Other: None. IMPRESSION: 1. No acute intracranial abnormality. 2. Ill-defined hypodensity in the left pons corresponding to known subacute infarct. Electronically Signed   By: Alan Rubio M.D.   On: 06/03/2021 14:51  ? ?DG Swallowing Func-Speech Pathology ? ?Result Date: 06/03/2021 ?Table formatting from the original result was not included. Images from the original result were not included. 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  Per chart review patient lives with spouse.  Modified independent with occasional use of a cane.  Two-level home bed and bath upstairs patient does have a chair lift on the stairs.  Presented 05/22/2021 with acute onset of slurred speech and bilateral lower extremity weakness.  EMS arrived blood pressure 183/77.  Cranial CT scan showed no acute intracranial abnormality.  Sequela of prior hemorrhage in the right frontal lobe and chronic microvascular ischemic disease.  Patient did not receive tPA.  Chest x-ray negative.  MRI of the brain showed acute perforator infarct of the parasagittal left pons.  Chronic infarcts and chronic microvascular ischemic changes. Tolerating a regular consistency diet.  Therapy evaluations completed due to patient decreased functional mobility lower extremity weakness and dysarthric speech was admitted for a comprehensive rehab program. As per children, patient lives with wife who has some dementia.  No data recorded  Recommendations for follow up therapy are one component of a multi-disciplinary discharge planning process, led by the attending physician.  Recommendations may be updated based on patient status, additional functional criteria and insurance authorization. Assessment / Plan / Recommendation   06/03/2021   5:49 PM Clinical Impressions Clinical Impression Pt presents with a mild oropharyngeal dysphagia in setting of recent CVA. Oral phase c/b generalized lingual weakness resulting in delayed oral transit and premature spillage of bolus over the base of tongue into  pyriform sinuses before the swallow with thin liquids, nectar thick liquids, and minimally with pureed consistencies. This was most significant with large cup sip of nectar thick liquids when pt?s head elevated upward while taking sip. Amount of premature spillage was reduced when liquids were given by straw which also allowed pt to maintain head in neutral and slightly downward position. Pharyngeal phase c/b reduced base of tongue retraction, hyolaryngeal excursion impacting epiglottic inversion, and delayed swallow initiation with head of bolus at the pyriform sinuses. Deficits resulted in mild residue in the valleculae and pyriform sinuses with all consistencies which were effectively cleared with secondary dry swallow. Pt sensed and initiated second swallows independently. There were two instances of flash penetration with consecutive straw sips of thin liquids however ejected from airway. No aspiration events occurred before/during/after the swallow under fluoro. Pt was given barium tablet with puree and exhibited delayed oral transit and retention of tablet in the valleculae. An additional tsp of puree facilitated effective clearance of tablet. Of note, there was presence of suspected cervical osteophytes (no radiologist present to confirm). This did not negatively impact transit/clearance of bolus. Given these instrumental findings, SLP recommends pt consume a Dysphagia 2 diet with thin liquids; liquids by straw only. Pt may take pills whole in puree when given one at a time. It is strongly advised pt receive subsequent tsp(s) of puree to aid pharyngeal clearance. Pt requires full supervision with all PO intake to facilitate and ensure adherence to safe swallowing precautions and strategies. Pt/family have been educated with findings and verbalized agreement with recommendations. MD and Nurse were also informed.     06/03/2021   5:13 PM Treatment Recommendations Treatment Recommendations Therapy as outlined in  treatment plan below     06/03/2021   5:23 PM Prognosis Prognosis for Safe Diet Advancement Good Barriers to Reach Goals Cognitive deficits;Other (Comment)   06/03/2021   5:49 PM Diet  Recommendations Compensations

## 2021-06-04 NOTE — Progress Notes (Signed)
Physical Therapy Session Note ? ?Patient Details  ?Name: Alan Rubio ?MRN: [id number] ?Date of Birth: 1931-06-24 ? ?Today's Date: 06/04/2021 ?PT Individual Time: 8242-3536 ?PT Individual Time Calculation (min): 53 min  ? ?Short Term Goals: ?Week 2:  PT Short Term Goal 1 (Week 2): Pt will perform supine<>sit with min assist ?PT Short Term Goal 2 (Week 2): Pt will perform sit<>stands using LRAD with min assist ?PT Short Term Goal 3 (Week 2): Pt will perform bed<>chair transfers using LRAD with min assist ?PT Short Term Goal 4 (Week 2): Pt will ambulate at least 52f using LRAD with CGA ?PT Short Term Goal 5 (Week 2): Pt will initiate stair navigation ? ?Skilled Therapeutic Interventions/Progress Updates:  ?  Pt received sitting in recliner (BLEs elevated) with his son, DQuita Skye and wife, BHorris Latino present and with encouragement pt agreeable to therapy session. Pt already wearing thigh high TED hose and abdominal binder. Vitals assessed throughout session as follows. Vitals sitting in recliner at beginning with feet lowered to the floor: BP 116/60 (MAP 77), HR 88bpm. Sitting in recliner pt requires max assist to bring trunk forward using B UE support on armrests to pull himself forward in order to initiate a transfer. Sit>stand recliner>RW with heavy max assist of 1 to facilitate anterior trunk lean, lifting to stand, and maintaining balance once in standing due to strong posterior lean - pt continues to be very slow to rise with delayed trunk/hip extension to achieve upright posture as well as requiring increased time to transition hands from pushing up on armrests to placing hands on RW. In standing, pt immediately states feeling "lightheaded" rated as 8/10 - attempted to take vitals in standing, but due to symptoms pt unable to tolerate standing long enough therefore once in sitting reassessed and they were BP 131/75 (MAP 92), HR 101bpm. Pt reports need to use bathroom. Sit>stand recliner>stedy with heavy max again as  described above. [facility] - pt able to come to stand from stedy seat with only light mod assist. Vitals while on BSC: BP 114/68 (MAP 82), HR 107bpm with pt stating "I'm getting kind of dizzy." Standing in stedy with heavy mod/light max assist of 1 during dependent assist LB clothing management and peri-care - pt had been incontinent of bladder but further continent of BM on BSC. [facility] with again heavy max assist for facilitating anterior weight shift and lifting into standing. Stedy transfer to EOB - reassessed vitals: BP 127/69 (MAP 86), HR 94bpm  ? ?Sit>stand from elevated EOB>RW with max assist for lifting to stand and balance due to posterior lean - continued cuing for anterior trunk lean, bringing feet back underneath BOS, pushing up from seat with 1 hand, and then trunk/hip extension to achieve upright once in standing. Gait training ~829fusing RW with max assist for balance and maintaining upright due to strong posterior lean and crouched, unsteady posture - close +2 w/c follow - pt demonstrates crouched posture with trunk/hip/knee flexion as well as poor R/L weight shift onto stance limb with short, shuffled steps - pt reports R knee pain with WBing and appears to favor that side throughout. Vitals after gait: BP 110/62 (MAP 74), HR 98bpm  ? ?Sit>stand w/c>RW with max assist as described above. Attempted to initiate gait back towards EOB using RW with continued max assist and +2 w/c follow and pt made it ~[id number], due to increased R knee pain and fatigue pt demonstrates  gradually worsening crouched posture with posterior lean and unsafe to continue attempting gait, therefore provided w/c for seated break.  ? ?L squat pivot transfer w/c>EOB with +2 max assist for lifting/pivoting hips to EOB while maintaining anterior trunk lean. Sit>supine via reverse logroll with step-by-step multimodal cuing for sequencing with max assist for trunk descent  and B LE management onto bed. Pt left supine in bed with needs in reach, bed alarm on, family present, and nurse aware of pt's position. ? ?Therapy Documentation ?Precautions:  ?Precautions ?Precautions: Fall, Other (comment) ?Precaution Comments: HOH, orthostatic ?Restrictions ?Weight Bearing Restrictions: No ? ?Pain: ? Reports neck pain, unrated, as well as R knee pain that occurred with WBing contributing to pt's limited tolerance to gait training.  ? ? ? ?Therapy/Group: Individual Therapy ? ?[place] ?06/04/2021, 8:37 PM  ?

## 2021-06-04 NOTE — Progress Notes (Addendum)
Occupational Therapy Session Note ? ?Patient Details  ?Name: Alan Rubio ?MRN: [id number] ?Date of Birth: 03/07/1931 ? ?Today's Date: 06/04/2021 ?OT Individual Time: 7588-3254 ?OT Individual Time Calculation (min): 44 min  ? ? ?Short Term Goals: ?Week 2:  OT Short Term Goal 1 (Week 2): Pt will complete sit<>stand using RW with min assist in prep for LB ADLs ?OT Short Term Goal 2 (Week 2): Pt will complete stand pivot from slightly elevated surface <> BSC with mod assist in prep for toileting ?OT Short Term Goal 3 (Week 2): Pt will pull up and pants over hips while standing with min assist. ? ?Skilled Therapeutic Interventions/Progress Updates:  ?Pt greeted supine in bed, reporting fatigue and agreeable to OT intervention with encouragement. Session focus on BADL reeducation, functional mobility, dynamic standing balance, increasing overall activity tolerance ADL/ functional mobility participation and decreasing overall caregiver burden.     Pt needed MAX A for all aspects of bed mobilty, pt reports need to void bowels, used stedy for transfer with pt needed MOD A to stand to stedy, total A transport to stedy.   ?Orthostatic vital signs taken throughout session with thigh high TEDS ?Supine- 132/62( 82) HR 83 ?EOB- 126/82 ( 94) HR 99 ?Pt reports urgency to void bowels; unable to obtain accurate standing BP; total A transport in stedy to [facility], pt unable to void bowels reporting pain from sitting on seat of BSC, transported to recliner and was abel to obtain standing BP here- 107/69( 81) HR 108, pt does endorse dizziness and feeling like his legs were weak. ?Once pt in recliner with legs elevated BP 173/91( 114) HR 102, of note with increased L lean even in recliner and unable to correct.  ?Attempted to stand from recliner with RW however unable to stand with +1 assist.  ?pt left seated in recliner with alarm belt activated and all needs within reach.                  ? ?Therapy Documentation ?Precautions:   ?Precautions ?Precautions: Fall, Other (comment) ?Precaution Comments: HOH, orthostatic ?Restrictions ?Weight Bearing Restrictions: No ? ?Pain: unrated generalized pain reported, rest breaks provided as needed.  ? ?Therapy/Group: Individual Therapy ? ?Precious Haws ?06/04/2021, 12:24 PM ?

## 2021-06-04 NOTE — Progress Notes (Addendum)
Patient ID: Alan Rubio, male   DOB: 01/15/1932, 88 y.o.   MRN: [id number] ? ?Patient has made a selection of [facility] SNF. SW will reach out to Ad to arrange a tour for family. Patient is currently not medically stable potentially discharging next week. Sw will send an update.  ? ?VM left for AD, Kitty J. Sw will wait for follow up ?

## 2021-06-04 NOTE — Progress Notes (Signed)
?                                                       PROGRESS NOTE ? ? ?Subjective/Complaints: ? ?Working with SLP this am.  No new concerns or complaints. IM following, appreciate assistance.  Nursing reports he was improved after LR fluids yesterday.  ? ?ROS: Limited due to cognitive/behavioral  ?No fevers, chills, CP, SOB, abd pain ? ? ?Objective: ?  ?DG Chest 1 View ? ?Result Date: 06/02/2021 ?CLINICAL DATA:  Generalized abdominal pain, history stroke, diabetes mellitus, atrial fibrillation, hypertension EXAM: CHEST  1 VIEW COMPARISON:  Portable exam 1418 hours compared to 05/22/2021 FINDINGS: Loop recorder projects over lower LEFT chest. Normal heart size, mediastinal contours, and pulmonary vascularity. Atherosclerotic calcification aorta. Pleural calcifications bilaterally unchanged question asbestos exposure. No pulmonary infiltrate, pleural effusion, or pneumothorax. Bones demineralized. 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 No acute abnormalities. Aortic Atherosclerosis (ICD10-I70.0). Electronically Signed   By: Lavonia Dana M.D.   On: 06/02/2021 14:49  ? ?DG Abd 1 View ? ?Result Date: 06/02/2021 ?CLINICAL DATA:  Generalized abdominal pain. EXAM: ABDOMEN - 1 VIEW COMPARISON:  None Available. FINDINGS: No gaseous bowel dilatation to suggest obstruction. Small to moderate volume stool seen scattered along the length of the colon. Fiducial markers overlie the prostate bed. Degenerative changes noted lower lumbar spine. IMPRESSION: Nonobstructive bowel gas pattern. Electronically Signed   By: Misty Stanley M.D.   On: 06/02/2021 14:45  ? ?CT HEAD WO CONTRAST (5MM) ? ?Result Date: 06/03/2021 ?CLINICAL DATA:  Altered mental status. EXAM: CT HEAD WITHOUT CONTRAST TECHNIQUE: Contiguous axial images were obtained from the base of the skull through the vertex without intravenous contrast. RADIATION DOSE REDUCTION: This exam was performed according to the departmental  dose-optimization program which includes automated exposure control, adjustment of the mA and/or kV according to patient size and/or use of iterative reconstruction technique. COMPARISON:  MRI brain and CT head dated May 22, 2021. FINDINGS: Brain: No evidence of acute infarction, hemorrhage, hydrocephalus, extra-axial collection or mass lesion/mass effect. Ill-defined hypodensity in the left pons corresponding to known subacute infarct. Remote lacunar infarct in the left thalamus again noted. Similar small area of encephalomalacia in the right frontal lobe. Stable atrophy and chronic microvascular ischemic changes. Vascular: Calcified atherosclerosis at the skull base. No hyperdense vessel. Skull: Normal. Negative for fracture or focal lesion. Sinuses/Orbits: No acute finding. Other: None. IMPRESSION: 1. No acute intracranial abnormality. 2. Ill-defined hypodensity in the left pons corresponding to known subacute infarct. Electronically Signed   By: Titus Dubin M.D.   On: 06/03/2021 14:51  ? ?DG Swallowing Func-Speech Pathology ? ?Result Date: 06/03/2021 ?Table formatting from the original result was not included. Images from the original result were not included. 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  Per chart review patient lives with spouse.  Modified independent with occasional use of a cane.  Two-level home bed and bath upstairs patient does have a chair lift on the stairs.  Presented 05/22/2021 with acute onset of slurred speech and bilateral lower extremity weakness.  EMS arrived blood pressure 183/77.  Cranial CT scan showed no acute intracranial abnormality.  Sequela of prior hemorrhage in the right frontal lobe and chronic microvascular ischemic disease.  Patient did not receive tPA.  Chest x-ray negative.  MRI of the brain showed acute perforator infarct of the parasagittal left pons.  Chronic infarcts and chronic microvascular ischemic changes. Tolerating a regular consistency diet.  Therapy evaluations completed due to patient decreased functional mobility lower extremity weakness and dysarthric speech was admitted for a comprehensive rehab program. As per children, patient lives with wife who has some dementia.  No data recorded  Recommendations for follow up therapy are one component of a multi-disciplinary discharge planning process, led by the attending physician.  Recommendations may be updated based on patient status, additional functional criteria and insurance authorization. Assessment / Plan / Recommendation   06/03/2021   5:49 PM Clinical Impressions Clinical Impression Pt presents with a mild oropharyngeal dysphagia in setting of recent CVA. Oral phase c/b generalized lingual weakness resulting in delayed oral transit and premature spillage of bolus over the base of tongue into pyriform sinuses before the swallow with thin liquids, nectar thick liquids, and minimally with pureed consistencies. This was most significant with large cup sip of  nectar thick liquids when pt?s head elevated upward while taking sip. Amount of premature spillage was reduced when liquids were given by straw which also allowed pt to maintain head in neutral and slightly downward position. Pharyngeal phase c/b reduced base of tongue retraction, hyolaryngeal excursion impacting epiglottic inversion, and delayed swallow initiation with head of bolus at the pyriform sinuses. Deficits resulted in mild residue in the valleculae and pyriform sinuses with all consistencies which were effectively cleared with secondary dry swallow. Pt sensed and initiated second swallows independently. There were two instances of flash penetration with consecutive straw sips of thin liquids however ejected from airway. No aspiration events occurred before/during/after the swallow under fluoro. Pt was given barium tablet with puree and exhibited delayed oral transit and retention of tablet in the valleculae. An additional tsp of puree facilitated effective clearance of tablet. Of note, there was presence of suspected cervical osteophytes (no radiologist present to confirm). This did not negatively impact transit/clearance of bolus. Given these instrumental findings, SLP recommends pt consume a Dysphagia 2 diet with thin liquids; liquids by straw only. Pt may take pills whole in puree when given one at a time. It is strongly advised pt receive subsequent tsp(s) of puree to aid pharyngeal clearance. Pt requires full supervision with all PO intake to facilitate and ensure adherence to safe swallowing precautions and strategies. Pt/family have been educated with findings and verbalized agreement with recommendations. MD and Nurse were also informed.     06/03/2021   5:13 PM Treatment Recommendations Treatment Recommendations Therapy as outlined in treatment plan below     06/03/2021  5:23 PM Prognosis Prognosis for Safe Diet Advancement Good Barriers to Reach Goals Cognitive deficits;Other (Comment)    06/03/2021   5:49 PM Diet Recommendations Compensations Minimize environmental distractions;Slow rate;Small sips/bites     06/03/2021   5:13 PM Other Recommendations Oral Care Recommendations Oral care BID Follow Up Rec

## 2021-06-04 NOTE — Progress Notes (Signed)
Physical Therapy Session Note ? ?Patient Details  ?Name: Alan Rubio ?MRN: [id number] ?Date of Birth: 08-17-1931 ? ?Today's Date: 06/04/2021 ?PT Individual Time: 3532-9924 ?PT Individual Time Calculation (min): 15 min  ? ?Short Term Goals: ?Week 1:  PT Short Term Goal 1 (Week 1): Pt will perform supine<>sit with min assist ?PT Short Term Goal 1 - Progress (Week 1): Not met ?PT Short Term Goal 2 (Week 1): Pt will perform sit<>stands using LRAD with min assist ?PT Short Term Goal 2 - Progress (Week 1): Not met ?PT Short Term Goal 3 (Week 1): Pt will perform bed<>chair transfers using LRAD with min assist ?PT Short Term Goal 3 - Progress (Week 1): Not met ?PT Short Term Goal 4 (Week 1): Pt will ambulate at least 33ft using LRAD with CGA ?PT Short Term Goal 4 - Progress (Week 1): Not met ?PT Short Term Goal 5 (Week 1): Pt will participate in standardized outcome measure assessing his balance ?PT Short Term Goal 5 - Progress (Week 1): Not met ?Week 2:  PT Short Term Goal 1 (Week 2): Pt will perform supine<>sit with min assist ?PT Short Term Goal 2 (Week 2): Pt will perform sit<>stands using LRAD with min assist ?PT Short Term Goal 3 (Week 2): Pt will perform bed<>chair transfers using LRAD with min assist ?PT Short Term Goal 4 (Week 2): Pt will ambulate at least 4ft using LRAD with CGA ?PT Short Term Goal 5 (Week 2): Pt will initiate stair navigation ? ?Skilled Therapeutic Interventions/Progress Updates:  ? Pt seen for makeup time this morning. Received pt semi-reclined in bed with family present at bedside. Pt reporting being uncomfortable, stating pain in neck and in buttocks. Wife concerned with constipation but per RN, pt had BM this morning. Pt required +2 assist to scoot to [facility] and rolled with +2 assist to place pillow under buttocks for pressure relief - pt appreciative and reported instant relief, but visibly fatigued with eyes closed. BP in supine: 137/60 without ted hose donned. PA arrived to discuss CT scan  results. Pt left semi-reclined in bed with eyes closed, needs within reach, and bed alarm on.  ? ?Therapy Documentation ?Precautions:  ?Precautions ?Precautions: Fall, Other (comment) ?Precaution Comments: HOH, orthostatic ?Restrictions ?Weight Bearing Restrictions: No ? ?Therapy/Group: Individual Therapy ?Blenda Nicely ?Becky Sax PT, DPT  ?06/04/2021, 10:10 AM  ?

## 2021-06-04 NOTE — Progress Notes (Signed)
Speech Language Pathology Daily Session Note ? ?Patient Details  ?Name: Alan Rubio ?MRN: [id number] ?Date of Birth: [birth year] ? ?Today's Date: 06/04/2021 ?SLP Individual Time: 3382-5053 ?SLP Individual Time Calculation (min): 57 min ? ?Short Term Goals: ?Week 2: SLP Short Term Goal 1 (Week 2): Patient will consume current diet with minimal overt s/s of aspiration and min A cues for safe swallowing precautions/strategies ?SLP Short Term Goal 2 (Week 2): Patient will orient x4 with sup-to-min A for use of external aids ?SLP Short Term Goal 3 (Week 2): Patient will complete basic-to-mildly complex problem solving tasks with min A verbal/visual cues ?SLP Short Term Goal 4 (Week 2): Patient will utilize external memory aids to increase functional recall with min-to-mod A verbal/visual cues ?SLP Short Term Goal 5 (Week 2): Patient will implement speech intelligiblity strategies at the phrase/sentence level with min-to-mod A verbal cues to reach 75% intelligiblity ? ?Skilled Therapeutic Interventions: Skilled ST treatment focused on cognitive-communication and swallowing goals. Patient received sleeping in bed on arrival and required max stimulation and extensive attempts to rouse by elevating head of bed, turning on lights, opening blinds, etc. Pt continued to require max A verbal/tactile cues to focus attention for 30 seconds-to-1 minute intervals throughout session and kept eyes closed for majority of session. Patient was oriented to person/place only; max A verbal/visual cues required to orient to time and situation using external aids. SLP facilitated session by providing max-to-total A hand-over-hand for washing face with warm washcloth. Pt required max A and eventually leading to total A for oral/denture care. At this point patient demonstrated improved focused attention in order to consider consumption of breakfast. Pt required max hand-over-hand A fading to min A for self feeding. Pt only agreeable to consume  chocolate magic cup with mildly reduced oral prep/transit and no overt s/sx of aspiration. SLP informed NT and nurse to continue to provide encouragement for additional PO intake if alert. Patient was left in bed with alarm activated and immediate needs within reach at end of session. Continue per current plan of care.   ?   ?Pain ? None/denied ? ?Therapy/Group: Individual Therapy ? ?Juandiego Kolenovic T Daren Doswell ?06/04/2021, 8:53 AM ?

## 2021-06-04 NOTE — Progress Notes (Addendum)
? ?[facility] ?PROGRESS NOTE ? ? ?Alan Rubio OFB:[id number] DOB: [birth year] DOA: 05/25/2021 ? ?PCP: [name], MD ? ?Brief History/Interval Summary: 88 y.o. 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  Hospitalist was consulted due to recent decline in his functional status despite getting rehab.  Some concern for alteration in mental status.   ? ? ?Subjective/Interval History: ?Patient is sitting up in the bed.  He has been given his medications by the nursing staff.  He seems to be oriented to place city year month.  Denies any headaches.  No abdominal pain.  No other concerns offered. ? ? ? ?Assessment/Plan: ? ?Acute metabolic encephalopathy ?Reason for his confusion is not entirely clear.  According to nursing staff his mentation did improve after he was given IV fluids.  Patient does not have any new focal neurological deficits currently.   ?CT head did not show any new findings.  He is oriented to place year month city.   ?Noted to be afebrile.  WBC unremarkable for the most part.  Urine culture is negative.  Chest x-ray did not show any acute findings. ?Ammonia level normal.  B12 level normal.  TSH was normal recently. ?Patient could have been hypovolemic which would explain the improvement in mentation after IV fluids.  He was apparently also orthostatic yesterday.  Seems to be stable for the most part.  Continue to monitor for now.  No additional testing at this time. ?  ?Orthostatic hypotension ?Apparently has blood pressures went from 782 systolic to 90 systolic many went from a sitting to standing position.  This is not documented anywhere.  Orthostatics to be repeated.  He was given IV fluids.  His Cardura was held.  Vania Rea was also held.  Continue to monitor.  TED stockings. ?  ?Essential hypertension ?Noted to  be on losartan.  Doxazosin on hold.  Blood pressure was noted to be quite elevated earlier this morning.  Will need to be rechecked. It looks like it does run high early in the morning. ? ?Hypothyroidism ?TSH recently checked on 4/28 and wnl ?Continue synthroid at 78mg daily  ?  ?Left pontine cerebrovascular accident ([facility] ?CT head without any new findings.  Continue aspirin Plavix statin. ?  ?[place] 2 [place] ?Recent A1C of 7.7 ?Recommend hold jardiance with hx of orthostatic hypotension ?Continue actos/SSI  ?  ?Leukocytosis ?Likely reactive/concentrated. No signs of infection or fever. ? ?Looks like plan is for placement to skilled nursing facility sometime next week.  Give IV fluids for another 10 hours.  We will continue to follow while he is here in the hospital. ?  ?  ? ?Medications: Scheduled: ? allopurinol  300 mg Oral Daily  ? aspirin EC  81 mg Oral Daily  ? atorvastatin  40 mg Oral Daily  ? clopidogrel  75 mg Oral Daily  ? diclofenac Sodium  2 g Topical QID  ? docusate sodium  100 mg Oral BID  ? doxazosin  2 mg Oral Q12H  ? empagliflozin  25 mg Oral Daily  ? enoxaparin (LOVENOX) injection  40 mg Subcutaneous Q24H  ? ezetimibe  10 mg Oral Daily  ? insulin aspart  0-9 Units Subcutaneous TID WC  ? levothyroxine  88 mcg Oral QAC breakfast  ? losartan  75 mg Oral Daily  ? megestrol  400 mg  Oral BID  ? melatonin  1.5 mg Oral QHS  ? multivitamin with minerals  1 tablet Oral Daily  ? nystatin   Topical BID  ? pioglitazone  30 mg Oral Daily  ? polyethylene glycol  17 g Oral Daily  ? sertraline  25 mg Oral Daily  ? ?Continuous: ? sodium chloride 75 mL/hr at 06/02/21 1830  ? ?WSF:[id number] **OR** acetaminophen (TYLENOL) oral liquid 160 mg/5 mL **OR** acetaminophen, sorbitol ? ?Antibiotics: ?Anti-infectives (From admission, onward)  ? ? None  ? ?  ? ? ?Objective: ? ?Vital Signs ? ?Vitals:  ? 06/03/21 1159 06/03/21 1506 06/03/21 1951 06/04/21 0328  ?BP: (!) 145/82 137/73 (!) 129/56 (!) 192/79   ?Pulse: 79 72 79 66  ?Resp: '18 16 18 17  '$ ?Temp:  97.8 ?F (36.6 ?C) 98.2 ?F (36.8 ?C) 98.3 ?F (36.8 ?C)  ?TempSrc:      ?SpO2: 96% 97% 98% 100%  ?Weight:      ?Height:      ? ? ?Intake/Output Summary (Last 24 hours) at 06/04/2021 1136 ?Last data filed at 06/03/2021 2300 ?Gross per 24 hour  ?Intake 460 ml  ?Output 200 ml  ?Net 260 ml  ? ?Filed Weights  ? 05/25/21 1615 06/01/21 1523  ?Weight: 75.4 kg 73.2 kg  ? ? ?General appearance: Awake alert.  In no distress.  Slightly lethargic but answers questions appropriately. ?Resp: Clear to auscultation bilaterally.  Normal effort ?Cardio: S1-S2 is normal regular.  No S3-S4.  No rubs murmurs or bruit ?GI: Abdomen is soft.  Nontender nondistended.  Bowel sounds are present normal.  No masses organomegaly ?Extremities: No edema.   ?Oriented x2.  No obvious focal neurological deficits. ? ? ?Lab Results: ? ?Data Reviewed: I have personally reviewed following labs and reports of the imaging studies ? ?CBC: ?Recent Labs  ?Lab 05/30/21 ?1432 06/02/21 ?9449 06/03/21 ?1340  ?WBC 6.8 11.3* 10.7*  ?NEUTROABS  --  9.1*  --   ?HGB 13.6 13.7 15.0  ?HCT 40.9 38.8* 46.0  ?MCV 94.2 91.9 95.2  ?PLT 252 268 281  ? ? ?Basic Metabolic Panel: ?Recent Labs  ?Lab 05/30/21 ?1432 05/31/21 ?0505 06/01/21 ?6759 06/02/21 ?1638 06/03/21 ?1340  ?NA 140 140  --  137 138  ?K 4.3 4.2  --  4.0 4.1  ?CL 108 109  --  109 107  ?CO2 22 22  --  21* 21*  ?GLUCOSE 217* 141*  --  153* 169*  ?BUN 28* 23  --  23 21  ?CREATININE 0.83 0.79 0.85 0.81 0.85  ?CALCIUM 10.1 9.7  --  9.5 10.0  ? ? ?GFR: ?Estimated Creatinine Clearance: 62.2 mL/min (by C-G formula based on SCr of 0.85 mg/dL). ? ?Liver Function Tests: ?Recent Labs  ?Lab 06/03/21 ?1340  ?AST 22  ?ALT 18  ?ALKPHOS 50  ?BILITOT 0.8  ?PROT 7.1  ?ALBUMIN 3.4*  ? ? ?Recent Labs  ?Lab 06/03/21 ?1340  ?AMMONIA <10  ? ? ? ? ?CBG: ?Recent Labs  ?Lab 06/03/21 ?0610 06/03/21 ?1139 06/03/21 ?1637 06/03/21 ?2115 06/04/21 ?4665  ?GLUCAP 158* 130* 200* 225* 148*  ? ? ? ? ?Anemia  Panel: ?Recent Labs  ?  06/03/21 ?1340  ?[id number]  ? ? ?Recent Results (from the past 240 hour(s))  ?Urine Culture     Status: Abnormal  ? Collection Time: 05/30/21  1:35 PM  ? Specimen: Urine, Clean Catch  ?Result Value Ref Range Status  ? Specimen Description URINE, CLEAN CATCH  Final  ? Special Requests  Final  ?  NONE ?Performed at [facility], [place] [address] [place], [place] [address] ?  ? Culture MULTIPLE SPECIES PRESENT, SUGGEST RECOLLECTION (A)  Final  ? Report Status 05/31/2021 FINAL  Final  ?Urine Culture     Status: None  ? Collection Time: 06/02/21  6:12 PM  ? Specimen: In/Out Cath Urine  ?Result Value Ref Range Status  ? Specimen Description IN/OUT CATH URINE  Final  ? Special Requests NONE  Final  ? Culture   Final  ?  NO GROWTH ?Performed at [facility], [place] [address], [place], [place] [address] ?  ? Report Status 06/03/2021 FINAL  Final  ?  ? ? ?Radiology Studies: ?DG Chest 1 View ? ?Result Date: 06/02/2021 ?CLINICAL DATA:  Generalized abdominal pain, history stroke, diabetes mellitus, atrial fibrillation, hypertension EXAM: CHEST  1 VIEW COMPARISON:  Portable exam 1418 hours compared to 05/22/2021 FINDINGS: Loop recorder projects over lower LEFT chest. Normal heart size, mediastinal contours, and pulmonary vascularity. Atherosclerotic calcification aorta. Pleural calcifications bilaterally unchanged question asbestos exposure. No pulmonary infiltrate, pleural effusion, or pneumothorax. Bones demineralized. 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 No acute abnormalities. Aortic Atherosclerosis (ICD10-I70.0). Electronically Signed   By: Lavonia Dana M.D.   On: 06/02/2021 14:49  ? ?DG Abd 1 View ? ?Result Date: 06/02/2021 ?CLINICAL DATA:  Generalized abdominal pain. EXAM: ABDOMEN - 1 VIEW COMPARISON:  None Available. FINDINGS: No gaseous bowel dilatation to suggest obstruction. Small to moderate volume stool seen scattered along the length  of the colon. Fiducial markers overlie the prostate bed. Degenerative changes noted lower lumbar spine. IMPRESSION: Nonobstructive bowel gas pattern. Electronically Signed   By: Misty Stanley M.D.   On: 05/

## 2021-06-04 NOTE — Progress Notes (Signed)
Patient ID: Alan Rubio, male   DOB: 1931/03/17, 88 y.o.   MRN: [id number] ? ?Sw received new bed preferences from patient family of Virden and Dustin Flock. SW will continue to follow up with the patient family.  ?

## 2021-06-05 DIAGNOSIS — M542 Cervicalgia: Secondary | ICD-10-CM

## 2021-06-05 LAB — BASIC METABOLIC PANEL
Anion gap: 5 (ref 5–15)
BUN: 24 mg/dL — ABNORMAL HIGH (ref 8–23)
CO2: 23 mmol/L (ref 22–32)
Calcium: 9.5 mg/dL (ref 8.9–10.3)
Chloride: 109 mmol/L (ref 98–111)
Creatinine, Ser: 0.77 mg/dL (ref 0.61–1.24)
GFR, Estimated: >60 mL/min (ref >60)
Glucose, Bld: 188 mg/dL — ABNORMAL HIGH (ref 70–99)
Potassium: 3.9 mmol/L (ref 3.5–5.1)
Sodium: 137 mmol/L (ref 135–145)

## 2021-06-05 LAB — CBC
HCT: 38.4 % — ABNORMAL LOW (ref 39.0–52.0)
Hemoglobin: 13.2 g/dL (ref 13.0–17.0)
MCH: 32 pg (ref 26.0–34.0)
MCHC: 34.4 g/dL (ref 30.0–36.0)
MCV: 93.2 fL (ref 80.0–100.0)
Platelets: 277 10*3/uL (ref 150–400)
RBC: 4.12 MIL/uL — ABNORMAL LOW (ref 4.22–5.81)
RDW: 13.2 % (ref 11.5–15.5)
WBC: 7 10*3/uL (ref 4.0–10.5)
nRBC: 0 % (ref 0.0–0.2)

## 2021-06-05 LAB — GLUCOSE, CAPILLARY
Glucose-Capillary: 173 mg/dL — ABNORMAL HIGH (ref 70–99)
Glucose-Capillary: 239 mg/dL — ABNORMAL HIGH (ref 70–99)
Glucose-Capillary: 235 mg/dL — ABNORMAL HIGH (ref 70–99)
Glucose-Capillary: 250 mg/dL — ABNORMAL HIGH (ref 70–99)

## 2021-06-05 MED ORDER — DOXAZOSIN MESYLATE 2 MG PO TABS: 2.0000 mg | ORAL_TABLET | Freq: Every day | ORAL | Status: DC

## 2021-06-05 MED ORDER — DOXAZOSIN MESYLATE 4 MG PO TABS
4.0000 mg | ORAL_TABLET | Freq: Every day | ORAL | Status: DC
  Administered 2021-06-05 – 2021-06-08 (×4): 4 mg via ORAL
  Filled 2021-06-05 (×4): qty 1

## 2021-06-05 NOTE — Progress Notes (Signed)
?                                                       PROGRESS NOTE ? ? ?Subjective/Complaints: ? ?Working with therapy this AM. IM following, appreciate assistance.  Reports some continue neck pain on the right. Remembered my name today.  ? ?ROS: Limited due to cognitive/behavioral  ?No fevers, chills, CP, SOB, abd pain, N/V/D/C ? ? ?Objective: ?  ?CT HEAD WO CONTRAST (5MM) ? ?Result Date: 06/03/2021 ?CLINICAL DATA:  Altered mental status. EXAM: CT HEAD WITHOUT CONTRAST TECHNIQUE: Contiguous axial images were obtained from the base of the skull through the vertex without intravenous contrast. RADIATION DOSE REDUCTION: This exam was performed according to the departmental dose-optimization program which includes automated exposure control, adjustment of the mA and/or kV according to patient size and/or use of iterative reconstruction technique. COMPARISON:  MRI brain and CT head dated May 22, 2021. FINDINGS: Brain: No evidence of acute infarction, hemorrhage, hydrocephalus, extra-axial collection or mass lesion/mass effect. Ill-defined hypodensity in the left pons corresponding to known subacute infarct. Remote lacunar infarct in the left thalamus again noted. Similar small area of encephalomalacia in the right frontal lobe. Stable atrophy and chronic microvascular ischemic changes. Vascular: Calcified atherosclerosis at the skull base. No hyperdense vessel. Skull: Normal. Negative for fracture or focal lesion. Sinuses/Orbits: No acute finding. Other: None. IMPRESSION: 1. No acute intracranial abnormality. 2. Ill-defined hypodensity in the left pons corresponding to known subacute infarct. Electronically Signed   By: Titus Dubin M.D.   On: 06/03/2021 14:51  ? ?DG Swallowing Func-Speech Pathology ? ?Result Date: 06/03/2021 ?Table formatting from the original result was not included. Images from the original result were not included. 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  Per chart review patient lives with spouse.  Modified independent with occasional use of a cane.  Two-level home bed and bath upstairs patient does have a chair lift on the stairs.  Presented 05/22/2021 with acute onset of slurred speech and bilateral lower extremity weakness.  EMS arrived blood pressure 183/77.  Cranial CT scan showed no acute intracranial abnormality.  Sequela of prior hemorrhage in the right frontal lobe and chronic microvascular ischemic disease.  Patient did not receive tPA.  Chest x-ray negative.  MRI of the brain showed acute perforator infarct of the parasagittal left pons.  Chronic infarcts and chronic microvascular ischemic changes. Tolerating a regular consistency diet.  Therapy evaluations completed due to  patient decreased functional mobility lower extremity weakness and dysarthric speech was admitted for a comprehensive rehab program. As per children, patient lives with wife who has some dementia.  No data recorded  Recommendations for follow up therapy are one  component of a multi-disciplinary discharge planning process, led by the attending physician.  Recommendations may be updated based on patient status, additional functional criteria and insurance authorization. Assessment / Plan / Recommendation   06/03/2021   5:49 PM Clinical Impressions Clinical Impression Pt presents with a mild oropharyngeal dysphagia in setting of recent CVA. Oral phase c/b generalized lingual weakness resulting in delayed oral transit and premature spillage of bolus over the base of tongue into pyriform sinuses before the swallow with thin liquids, nectar thick liquids, and minimally with pureed consistencies. This was most significant with large cup sip of nectar thick liquids when pt?s head elevated upward while taking sip. Amount of premature spillage was reduced when liquids were given by straw which also allowed pt to maintain head in neutral and slightly downward position. Pharyngeal phase c/b reduced base of tongue retraction, hyolaryngeal excursion impacting epiglottic inversion, and delayed swallow initiation with head of bolus at the pyriform sinuses. Deficits resulted in mild residue in the valleculae and pyriform sinuses with all consistencies which were effectively cleared with secondary dry swallow. Pt sensed and initiated second swallows independently. There were two instances of flash penetration with consecutive straw sips of thin liquids however ejected from airway. No aspiration events occurred before/during/after the swallow under fluoro. Pt was given barium tablet with puree and exhibited delayed oral transit and retention of tablet in the valleculae. An additional tsp of puree facilitated effective clearance of tablet. Of note, there was presence of suspected cervical osteophytes (no radiologist present to confirm). This did not negatively impact transit/clearance of bolus. Given these instrumental findings, SLP recommends pt consume a Dysphagia 2 diet with thin liquids;  liquids by straw only. Pt may take pills whole in puree when given one at a time. It is strongly advised pt receive subsequent tsp(s) of puree to aid pharyngeal clearance. Pt requires full supervision with all PO intake to facilitate and ensure adherence to safe swallowing precautions and strategies. Pt/family have been educated with findings and verbalized agreement with recommendations. MD and Nurse were also informed.     06/03/2021   5:13 PM Treatment Recommendations Treatment Recommendations Therapy as outlined in treatment plan below     06/03/2021   5:23 PM Prognosis Prognosis for Safe Diet Advancement Good Barriers to Reach Goals Cognitive deficits;Other (Comment)   06/03/2021   5:49 PM Diet Recommendations Compensations Minimize environmental distractions;Slow rate;Small sips/bites     06/03/2021   5:13 PM Other Recommendations Oral Care Recommendations Oral care BID Follow Up Recommendations Acute inpatient rehab (3hours/day) Assistance recommended at discharge Frequent or constant Supervision/Assistance Functional Status Assessment Patient has had a recent decline in their functional status and demonstrates the ability to make significant improvements in function in a reasonable and predictable amount of time.    View : No data to display.        06/03/2021   4:45 PM Oral Phase Oral Phase Impaired Oral - Nectar Cup Premature spillage;Weak lingual manipulation Oral - Nectar Straw Premature spillage;Weak lingual manipulation;Delayed oral transit Oral - Thin Teaspoon Premature spillage;Delayed oral transit;Weak lingual manipulation Oral - Thin Cup NT Oral - Thin Straw Premature spillage;Weak lingual manipulation;Delayed oral transit Oral - Puree Delayed oral transit;Weak lingual manipulation Oral - Mech Soft Weak lingual manipulation;Impaired mastication;Delayed  oral transit Oral - Regular NT Oral - Multi-Consistency NT Oral - Pill Weak lingual manipulation;Delayed oral transit;Reduced posterior propulsion     06/03/2021   4:56 PM Pharyngeal Phase Pharyngeal Phase Impaired Pharyngeal- Nectar Teaspoon Reduced epiglottic inversion;Reduced anterior laryngeal mobility;Reduced tongue base retraction;Pharyngeal residue - valle

## 2021-06-05 NOTE — Progress Notes (Signed)
Physical Therapy Session Note ? ?Patient Details  ?Name: Alan Rubio ?MRN: [id number] ?Date of Birth: 03-24-1931 ? ?Today's Date: 06/05/2021 ?PT Individual Time: 1007-1100 ?PT Individual Time Calculation (min): 53 min  ? ?Short Term Goals: ?Week 2:  PT Short Term Goal 1 (Week 2): Pt will perform supine<>sit with min assist ?PT Short Term Goal 2 (Week 2): Pt will perform sit<>stands using LRAD with min assist ?PT Short Term Goal 3 (Week 2): Pt will perform bed<>chair transfers using LRAD with min assist ?PT Short Term Goal 4 (Week 2): Pt will ambulate at least 72f using LRAD with CGA ?PT Short Term Goal 5 (Week 2): Pt will initiate stair navigation ? ?Skilled Therapeutic Interventions/Progress Updates:  ?  Pt received supine in bed with his wife, BHorris Latino and son, DQuita Skye present and with encouragement pt agreeable to therapy session. Pt already wearing thigh high TED hose. Vitals assessed throughout session as described below.  ?Supine vitals wearing TEDs: BP 161/76 (MAP 103), HR 75bpm  ? ?Supine>sitting L EOB, HOB partially elevated and using bedrail, with hand-over-hand assist to reach with R UE across body and grab onto bedrail and max manual facilitation to bring both LEs into hooklying position followed by max assist for rolling and bringing trunk upright - multimodal cuing throughout to increase pt initiation. Sitting EOB, pt requires mod/max assist for sitting balance due to posterior lean with UE support on bedrails progressed towards close supervision by end of session.  ? ?Sitting vitals just TEDs: BP 141/68 (MAP 84), HR 80bpm  ?Donned abdomina binder and reassessed sitting vitals: BP 186/94 (MAP 120), HR 94bpm  ?Therefore therapist removed abdominal binder. ? ?Performed seated anterior trunk lean/weight shift by having pt push therapist forward to activate pt's abdominal muscles to improve anterior weight shift in preparation for transfers x 8 reps.  ?Of note: when pt going to reach for items he often reaches  out into thin air questioning whether it is due to feelings of weakness vs impaired awareness of where he is reaching towards ? ?Reassessed seated vitals: BP 155/66 (MAP 92), HR 81bpm  ? ?Sit>stand elevated EOB>RW with max assist for lifting and facilitating anterior trunk lean/weight shift due to pt having strong posterior lean in standing.  ?Pt reports feeling "light headed" and "dizzy ?Standing vitals: BP 118/61 (MAP 77), HR 90bpm  ?Performed standing marching in place to try improving symptoms, but pt continuing to report symptoms therefore returned to sitting. ? ?Reassessed vitals in sitting: BP 146/74 (MAP 96), HR 87bpm  ? ?Returned to standing from elevated EOB>RW with heavy mod assist this time as pt demonstrating improving anterior trunk lean with increased time and continued cuing; however, pt again reports feeling lightheaded while standing therefore returned to sitting. Vitals: BP 134/71 (MAP 86), HR 88bpm  ? ?Pt in agreement to transfer to recliner. L stand pivot EOB>recliner using RW with pt progressing towards light mod assist for standing balance today due to continued mild posterior lean, but demos improved upright posture and increased step lengths compared to gait yesterday.  ? ?Pt had been incontinent of bladder. Sit>stand recliner>stedy with heavy mod/max assist for lifting to stand during dependent assist LB clothing management and peri-care. Pt left seated in recliner in the care of NT to complete hygiene. ? ? ? ?Therapy Documentation ?Precautions:  ?Precautions ?Precautions: Fall, Other (comment) ?Precaution Comments: HOH, orthostatic ?Restrictions ?Weight Bearing Restrictions: No ? ? ?Pain: ? Pt continues to report having posterior neck pain that radiates up into the back of  his head - requested heating pad for pain management - pt doesn't continue to complain of this pain once he is up and moving. ? ? ? ?Therapy/Group: Individual Therapy ? ?Tawana Scale , PT, DPT, NCS, CSRS ?06/05/2021,  8:01 AM  ?

## 2021-06-05 NOTE — Consult Note (Signed)
06/05/2021 9 AM-10 AM: ? ?I attempted to meet with the patient as well as family members if at all possible and then family members were not available during his scheduled appointment with me.  I attempted to return to continue her appointment later but by the time I returned he had already started his speech therapy session.  I did spend time with the patient individually and noted significant hard of hearing status with the patient having difficulty comprehending to some degree beyond his significant hearing loss.  The patient acknowledges significant frustration with his status but did not verbalize any suicidal ideation specifically but did question how much all of these efforts that were being attempted would be able to help him.  He was aware of his significant complicated medical history and difficulties and did report a desire to improve and get better but was quite frustrated with feelings of helplessness and hopelessness.  He was not teary-eyed today having these discussions.  At that point we agreed to further our discussion later with family members there but I was not able to get that treated work out on the schedule with his other therapies.  I will attempt to meet with the patient again on Monday. ? ?

## 2021-06-05 NOTE — Progress Notes (Signed)
Speech Language Pathology Daily Session Note ? ?Patient Details  ?Name: Alan Rubio ?MRN: [id number] ?Date of Birth: 07-30-1931 ? ?Today's Date: 06/05/2021 ?SLP Individual Time: 1027-2536 ?SLP Individual Time Calculation (min): 65 min ? ?Short Term Goals: ?Week 2: SLP Short Term Goal 1 (Week 2): Patient will consume current diet with minimal overt s/s of aspiration and min A cues for safe swallowing precautions/strategies ?SLP Short Term Goal 2 (Week 2): Patient will orient x4 with sup-to-min A for use of external aids ?SLP Short Term Goal 3 (Week 2): Patient will complete basic-to-mildly complex problem solving tasks with min A verbal/visual cues ?SLP Short Term Goal 4 (Week 2): Patient will utilize external memory aids to increase functional recall with min-to-mod A verbal/visual cues ?SLP Short Term Goal 5 (Week 2): Patient will implement speech intelligiblity strategies at the phrase/sentence level with min-to-mod A verbal cues to reach 75% intelligiblity ? ?Skilled Therapeutic Interventions: Skilled ST treatment focused on cognitive and swallowing goals. Pt received alert and in recliner on arrival. Pt remained alert and participative for duration of today's session. SLP minimized distractions by turning off television during session. Son, spouse, and PA had discussion earlier with SLP re: allowing grounds pass and also inquired about bringing patient some outside food/snacks as appropriate per current diet restrictions in effort to enhance PO intake. Pt appropriate for Dys 3 trials, therefore assessed tolerance with pt preferred breakfast including a soft blueberry muffin with peach juice drizzled on top. Pt consumed with mildly prolonged yet effective mastication, timely AP transit, minimal oral residuals post swallow, and without overt s/sx of aspiration. Pt appeared highly motivated by this, performed self feeding with sup A for swallowing safety following set-up assist, and consumed 75% of muffin and a few  bites of peaches via fruit cup. Pt also consumed 8oz of thin liquid by straw without overt s/sx of aspiration. SLP recommends continuation of current diet. Plan for additional Dys 3 PO trials prior to diet advancement. SLP cleared family to provide pt with soft muffins from home as snack while fully supervised - family also cleared to provide supervision with intake. SLP reinforced education on swallowing safety, as well as strategies to maximize attention, orientation, and memory. Patient was left in recliner with alarm activated and immediate needs within reach at end of session. Continue per current plan of care.   ?   ?Pain ? None/denied ? ?Therapy/Group: Individual Therapy ? ?Cynthia Stainback T Kevion Fatheree ?06/05/2021, 4:31 PM ?

## 2021-06-05 NOTE — Progress Notes (Addendum)
? ?[facility] ?PROGRESS NOTE ? ? ?Alan Rubio WUJ:[id number] DOB: May 15, 1931 DOA: 05/25/2021 ? ?PCP: [name], MD ? ?Brief History/Interval Summary: 88 y.o. 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  Hospitalist was consulted due to recent decline in his functional status despite getting rehab.  Some concern for alteration in mental status.   ? ? ?Subjective/Interval History: ?Patient sitting up in the bed.  Denies any complaints.  No nausea vomiting.  No shortness of breath.   ? ? ? ?Assessment/Plan: ? ?Acute metabolic encephalopathy ?Reason for his confusion is not entirely clear.  According to nursing staff his mentation did improve after he was given IV fluids.  Not noted to have any neurological deficits.  He answered questions appropriately yesterday and today. ?CT head did not show any new findings. Noted to be afebrile.  WBC is normal.  Urine culture was negative.  Chest x-ray did not show any acute findings.   ?Ammonia level normal.  B12 level normal.  TSH was normal recently. ?Patient could have been hypovolemic which would explain the improvement in mentation after IV fluids.  Was given additional IV fluids.  Will discontinue IV fluids for now. ?Mentation could be back to baseline.  No additional testing at this time.   ?  ?Orthostatic hypotension ?Apparently has blood pressures went from 956 systolic to 90 systolic many went from a sitting to standing position.  This is not documented anywhere.  No orthostatics have been done in the last 48 hours. ?Vania Rea was discontinued.  Blood pressure is noted to be in the hypertensive range.  Changed doxazosin to bedtime.  TED stockings. ?  ?Essential hypertension ?Noted to be on losartan.  Change doxazosin to bedtime.  Dose of losartan was increased to yesterday.  Will  not be too aggressive in controlling his elevated blood pressures.   ? ?Hypothyroidism ?TSH recently checked on 4/28 and wnl ?Continue synthroid at 19mg daily  ?  ?Left pontine cerebrovascular accident ([facility] ?CT head without any new findings.  Continue aspirin Plavix statin. ?  ?[place] ?Recent A1C of 7.7 ?Recommend hold jardiance with hx of orthostatic hypotension ?Continue actos/SSI  ?  ?Leukocytosis ?Likely reactive/concentrated. No signs of infection or fever. ? ?Looks like plan is for placement to skilled nursing facility sometime next week.   ? ?Seems to be stable from a medical standpoint.  Continue losartan at current dose.  We will continue to follow while he is in the hospital.   ?  ?  ? ?Medications: Scheduled: ? allopurinol  300 mg Oral Daily  ? aspirin EC  81 mg Oral Daily  ? atorvastatin  40 mg Oral Daily  ? clopidogrel  75 mg Oral Daily  ? diclofenac Sodium  2 g Topical QID  ? docusate sodium  100 mg Oral BID  ? doxazosin  2 mg Oral Q12H  ? doxazosin  2 mg Oral QHS  ? empagliflozin  25 mg Oral Daily  ? enoxaparin (LOVENOX) injection  40 mg Subcutaneous Q24H  ? ezetimibe  10 mg Oral Daily  ? insulin aspart  0-9 Units Subcutaneous TID WC  ? levothyroxine  88 mcg Oral QAC breakfast  ? losartan  75 mg Oral Daily  ? megestrol  400 mg Oral BID  ? melatonin  1.5 mg Oral QHS  ? multivitamin with  minerals  1 tablet Oral Daily  ? nystatin   Topical BID  ? pioglitazone  30 mg Oral Daily  ? polyethylene glycol  17 g Oral Daily  ? sertraline  25 mg Oral Daily  ? ?Continuous: ? sodium chloride 75 mL/hr at 06/02/21 1830  ? ?YNW:[id number] **OR** acetaminophen (TYLENOL) oral liquid 160 mg/5 mL **OR** acetaminophen, sorbitol ? ?Antibiotics: ?Anti-infectives (From admission, onward)  ? ? None  ? ?  ? ? ?Objective: ? ?Vital Signs ? ?Vitals:  ? 06/04/21 1345 06/04/21 2002 06/05/21 0532 06/05/21 0533  ?BP: 120/75 (!) 157/70 (!) 161/64 (!) 168/67  ?Pulse: (!) 103 72 66 67  ?Resp: '16 18 18    '$ ?Temp: 98 ?F (36.7 ?C) 98.5 ?F (36.9 ?C) 98.2 ?F (36.8 ?C)   ?TempSrc:  Oral    ?SpO2: 99% 98% 100% 100%  ?Weight:      ?Height:      ? ? ?Intake/Output Summary (Last 24 hours) at 06/05/2021 1156 ?Last data filed at 06/05/2021 [id number] ?Gross per 24 hour  ?Intake 743.75 ml  ?Output --  ?Net 743.75 ml  ? ? ?Filed Weights  ? 05/25/21 1615 06/01/21 1523  ?Weight: 75.4 kg 73.2 kg  ? ? ?General appearance: Awake alert.  In no distress.  Lethargic but answers questions appropriately. ?Resp: Clear to auscultation bilaterally.  Normal effort ?Cardio: S1-S2 is normal regular.  No S3-S4.  No rubs murmurs or bruit ?GI: Abdomen is soft.  Nontender nondistended.  Bowel sounds are present normal.  No masses organomegaly ? ? ?Lab Results: ? ?Data Reviewed: I have personally reviewed following labs and reports of the imaging studies ? ?CBC: ?Recent Labs  ?Lab 05/30/21 ?1432 06/02/21 ?2952 06/03/21 ?1340 06/05/21 ?8413  ?WBC 6.8 11.3* 10.7* 7.0  ?NEUTROABS  --  9.1*  --   --   ?HGB 13.6 13.7 15.0 13.2  ?HCT 40.9 38.8* 46.0 38.4*  ?MCV 94.2 91.9 95.2 93.2  ?PLT 252 268 281 277  ? ? ? ?Basic Metabolic Panel: ?Recent Labs  ?Lab 05/30/21 ?1432 05/31/21 ?0505 06/01/21 ?2440 06/02/21 ?1027 06/03/21 ?1340 06/05/21 ?2536  ?NA 140 140  --  137 138 137  ?K 4.3 4.2  --  4.0 4.1 3.9  ?CL 108 109  --  109 107 109  ?CO2 22 22  --  21* 21* 23  ?GLUCOSE 217* 141*  --  153* 169* 188*  ?BUN 28* 23  --  23 21 24*  ?CREATININE 0.83 0.79 0.85 0.81 0.85 0.77  ?CALCIUM 10.1 9.7  --  9.5 10.0 9.5  ? ? ? ?GFR: ?Estimated Creatinine Clearance: 66.1 mL/min (by C-G formula based on SCr of 0.77 mg/dL). ? ?Liver Function Tests: ?Recent Labs  ?Lab 06/03/21 ?1340  ?AST 22  ?ALT 18  ?ALKPHOS 50  ?BILITOT 0.8  ?PROT 7.1  ?ALBUMIN 3.4*  ? ? ? ?Recent Labs  ?Lab 06/03/21 ?1340  ?AMMONIA <10  ? ? ? ? ? ?CBG: ?Recent Labs  ?Lab 06/04/21 ?1139 06/04/21 ?1649 06/04/21 ?2102 06/05/21 ?6440 06/05/21 ?1136  ?GLUCAP 228* 231* 239* 173* 239*  ? ? ? ? ? ?Anemia Panel: ?Recent Labs   ?  06/03/21 ?1340  ?[id number]  ? ? ? ?Recent Results (from the past 240 hour(s))  ?Urine Culture     Status: Abnormal  ? Collection Time: 05/30/21  1:35 PM  ? Specimen: Urine, Clean Catch  ?Result Value Ref Range Status  ? Specimen Description URINE, CLEAN CATCH  Final  ? Special Requests  Final  ?  NONE ?Performed at [facility], [place] [address], [place], [place] [address] ?  ? Culture MULTIPLE SPECIES PRESENT, SUGGEST RECOLLECTION (A)  Final  ? Report Status 05/31/2021 FINAL  Final  ?Urine Culture     Status: None  ? Collection Time: 06/02/21  6:12 PM  ? Specimen: In/Out Cath Urine  ?Result Value Ref Range Status  ? Specimen Description IN/OUT CATH URINE  Final  ? Special Requests NONE  Final  ? Culture   Final  ?  NO GROWTH ?Performed at [facility], [place] [address], [place], [place] [address] ?  ? Report Status 06/03/2021 FINAL  Final  ? ?  ? ? ?Radiology Studies: ?CT HEAD WO CONTRAST (5MM) ? ?Result Date: 06/03/2021 ?CLINICAL DATA:  Altered mental status. EXAM: CT HEAD WITHOUT CONTRAST TECHNIQUE: Contiguous axial images were obtained from the base of the skull through the vertex without intravenous contrast. RADIATION DOSE REDUCTION: This exam was performed according to the departmental dose-optimization program which includes automated exposure control, adjustment of the mA and/or kV according to patient size and/or use of iterative reconstruction technique. COMPARISON:  MRI brain and CT head dated May 22, 2021. FINDINGS: Brain: No evidence of acute infarction, hemorrhage, hydrocephalus, extra-axial collection or mass lesion/mass effect. Ill-defined hypodensity in the left pons corresponding to known subacute infarct. Remote lacunar infarct in the left thalamus again noted. Similar small area of encephalomalacia in the right frontal lobe. Stable atrophy and chronic microvascular ischemic changes. Vascular: Calcified atherosclerosis at the skull base. No hyperdense vessel. Skull: Normal.  Negative for fracture or focal lesion. Sinuses/Orbits: No acute finding. Other: None. IMPRESSION: 1. No acute intracranial abnormality. 2. Ill-defined hypodensity in the left pons corresponding to known subacute

## 2021-06-05 NOTE — Plan of Care (Signed)
?  Problem: Consults ?Goal: RH STROKE PATIENT EDUCATION ?Description: See Patient Education module for education specifics  ?Outcome: Progressing ?  ?Problem: RH BOWEL ELIMINATION ?Goal: RH STG MANAGE BOWEL WITH ASSISTANCE ?Description: STG Manage Bowel with mod I  Assistance. ?Outcome: Progressing ?Goal: RH STG MANAGE BOWEL W/MEDICATION W/ASSISTANCE ?Description: STG Manage Bowel with Medication with mod I Assistance. ?Outcome: Progressing ?  ?Problem: RH BLADDER ELIMINATION ?Goal: RH STG MANAGE BLADDER WITH ASSISTANCE ?Description: STG Manage Bladder With mod I /toileting Assistance ?Outcome: Progressing ?Goal: RH STG MANAGE BLADDER WITH MEDICATION WITH ASSISTANCE ?Description: STG Manage Bladder With Medication With mod I  Assistance. ?Outcome: Progressing ?  ?Problem: RH SAFETY ?Goal: RH STG ADHERE TO SAFETY PRECAUTIONS W/ASSISTANCE/DEVICE ?Description: STG Adhere to Safety Precautions With cues Assistance/Device. ?Outcome: Progressing ?  ?Problem: RH PAIN MANAGEMENT ?Goal: RH STG PAIN MANAGED AT OR BELOW PT'S PAIN GOAL ?Description: At or below level 4 w prns ?Outcome: Progressing ?  ?Problem: RH KNOWLEDGE DEFICIT ?Goal: RH STG INCREASE KNOWLEDGE OF DIABETES ?Description: Patient will be able to manage DM with medications and dietary modifications using handouts and educational resources independently ?Outcome: Progressing ?Goal: RH STG INCREASE KNOWLEDGE OF HYPERTENSION ?Description: Patient will be able to manage HTN with medications and dietary modifications using handouts and educational resources independently ?Outcome: Progressing ?Goal: RH STG INCREASE KNOWLEGDE OF HYPERLIPIDEMIA ?Description: Patient will be able to manage HLD with medications and dietary modifications using handouts and educational resources independently ?Outcome: Progressing ?Goal: RH STG INCREASE KNOWLEDGE OF STROKE PROPHYLAXIS ?Description: Patient will be able to manage secondary stroke risks with medications and dietary  modifications using handouts and educational resources independently ?Outcome: Progressing ?  ?

## 2021-06-05 NOTE — Progress Notes (Signed)
Occupational Therapy Session Note ? ?Patient Details  ?Name: Alan Rubio ?MRN: [id number] ?Date of Birth: April 07, 1931 ? ?Today's Date: 06/05/2021 ?OT Individual Time: 9562-1308 ?OT Individual Time Calculation (min): 70 min  ? ? ?Skilled Therapeutic Interventions/Progress Updates: Patient needing maximal assist and encouragement throughout OT ADL treatment. Patient stating he cannot tolerate sitting and required encouragement to turn head towards the right to be midline or to look towards the tasks throughout the ADL. ?   ? ?Therapy Documentation ?Precautions:  ?Precautions ?Precautions: Fall, Other (comment) ?Precaution Comments: HOH, orthostatic ?Restrictions ?Weight Bearing Restrictions: No ?General: ?  ?Vital Signs:bp for bed level activities 172/78 hr 68 ? ?Pain:Reports neck pain along R upper trap ?  ?ADL: Patient seen for AM ADL's and self feeding. Patient unable to tolerate sitting EOB for bathing and grooming tasks. Hand over hand assist for washing face and attempt at grooming tasks. Patient declined therapist assist to soak denture stating it had been done. Patient attempted to wash BUE's and chest, but needed therapist assist to complete task for thoroughness. Patient assisted to roll left and right with assist or rails and max assist by therapist for washing back buttocks and legs. Patient assisted to wash lower body in supine. Had incontinent episode and required dependent care to change brief and clean skin and apply barrier cream. Dressed patient's lowerbody with total assist, donned teds, brief and pants. Patient able to assist with rolling while therapist managed clothing. Bed moved to chair position. Patient able to assist at getting hands in sleeves of pull over shirt. Total assist to get sleeve past elbow and to get head into the shirt. Needed assist to pull shirt down. While in chair position patient assisted with self feeding. Patient provided hand over hand, but quickly fatigued. Motivated to eat  magic cup and drink nutrition drink, but needed cues to slow down and swallow each sip.  ? ?Vision: Patient wears glasses. Donned for self feeding ?  ?Continue with OT POC and maximal encouragement to get up and OOB.  ?  ? ? ?Therapy/Group: Individual Therapy ? ?Hermina Barters ?06/05/2021, 10:04 AM ?

## 2021-06-06 DIAGNOSIS — M542 Cervicalgia: Secondary | ICD-10-CM

## 2021-06-06 LAB — GLUCOSE, CAPILLARY
Glucose-Capillary: 215 mg/dL — ABNORMAL HIGH (ref 70–99)
Glucose-Capillary: 315 mg/dL — ABNORMAL HIGH (ref 70–99)
Glucose-Capillary: 181 mg/dL — ABNORMAL HIGH (ref 70–99)
Glucose-Capillary: 140 mg/dL — ABNORMAL HIGH (ref 70–99)

## 2021-06-06 MED ORDER — EMPAGLIFLOZIN 25 MG PO TABS
25.0000 mg | ORAL_TABLET | Freq: Every day | ORAL | Status: DC
  Filled 2021-06-06: qty 1

## 2021-06-06 MED ORDER — EMPAGLIFLOZIN 10 MG PO TABS
10.0000 mg | ORAL_TABLET | Freq: Every day | ORAL | Status: DC
  Administered 2021-06-06 – 2021-06-09 (×4): 10 mg via ORAL
  Filled 2021-06-06 (×4): qty 1

## 2021-06-06 MED ORDER — MEGESTROL ACETATE 400 MG/10ML PO SUSP
200.0000 mg | Freq: Two times a day (BID) | ORAL | Status: DC
Start: 2021-06-06 — End: 2021-06-09
  Administered 2021-06-07 – 2021-06-09 (×3): 200 mg via ORAL
  Filled 2021-06-06 (×2): qty 10
  Filled 2021-06-06: qty 5
  Filled 2021-06-06 (×2): qty 10
  Filled 2021-06-06 (×5): qty 5

## 2021-06-06 NOTE — Progress Notes (Addendum)
? ?[facility] ?PROGRESS NOTE ? ? ?Alan Rubio VPX:[id number] DOB: 11/16/1931 DOA: 05/25/2021 ? ?PCP: [name], MD ? ?Brief History/Interval Summary: 88 y.o. 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  Hospitalist was consulted due to recent decline in his functional status despite getting rehab.  Some concern for alteration in mental status.   ? ? ?Subjective/Interval History: ?Patient sitting up in the bed.  Denies any shortness of breath or chest pain specifically.  No nausea vomiting.   ? ? ?Assessment/Plan: ? ?Acute metabolic encephalopathy ?Reason for his confusion is not entirely clear.  According to nursing staff his mentation did improve after he was given IV fluids.  Not noted to have any neurological deficits.  He answered questions appropriately yesterday and today. ?CT head did not show any new findings. Noted to be afebrile.  WBC is normal.  Urine culture was negative.  Chest x-ray did not show any acute findings.   ?Ammonia level normal.  B12 level normal.  TSH was normal recently. ?Patient could have been hypovolemic which would explain the improvement in mentation after IV fluids.   ?Mentation could be back to baseline.  No additional testing at this time.   ?  ?Orthostatic hypotension ?Apparently has blood pressures went from 462 systolic to 90 systolic many went from a sitting to standing position.  This is not documented anywhere.   ?Vania Rea was discontinued.   ?Blood pressures were in the hypertensive range as discussed below.  Continue with TED stockings.   ?  ?Essential hypertension ?Prior to admission patient was on doxazosin.  And was also on losartan 50 mg daily. ?Blood pressure was noted to be elevated here.  This was complicated by the fact that he was noted to be orthostatic. ?Dose of losartan has  been increased slightly.  Doxazosin being given only at bedtime.   ?Blood pressure seems to be better.  Will not be too aggressive in this elderly patient.  Current blood pressure readings are noted.  Blood pressure of about 703-500 systolic should be tolerated.   ? ?Hypothyroidism ?TSH recently checked on 4/28 and wnl ?Continue synthroid at 23mg daily  ?  ?Left pontine cerebrovascular accident ([facility] ?CT head without any new findings.  Continue aspirin Plavix statin. ?  ?[place] 2 [place] ?Recent A1C of 7.7 ?Continue actos/SSI  ?Actos dose was increased.  CBGs remain poorly controlled.  Since orthostatic hypotension was never actually documented we will resume Jardiance '10mg'$  daily.  Monitor for hypotension. ?  ?Leukocytosis ?Likely reactive/concentrated. No signs of infection or fever. ? ?Looks like plan is for placement to skilled nursing facility sometime next week.   ? ?Seems to be stable from medical standpoint.  Continue losartan and doxazosin at current doses.   ? ?Discussed with the rehab attending.  We will reinitiate the Jardiance since his blood pressures have been stable and there is no real documentation of an orthostatic drop in blood pressure.  We will reevaluate him tomorrow and then sign off at that time if he remains stable. ?  ?  ? ?Medications: Scheduled: ? allopurinol  300 mg Oral Daily  ? aspirin EC  81 mg Oral Daily  ? atorvastatin  40 mg Oral Daily  ? clopidogrel  75 mg Oral Daily  ? diclofenac Sodium  2 g Topical QID  ? docusate sodium  100 mg Oral BID  ? doxazosin  4 mg Oral QHS  ? enoxaparin (LOVENOX) injection  40 mg Subcutaneous Q24H  ? ezetimibe  10 mg Oral Daily  ? insulin aspart  0-9 Units Subcutaneous TID WC  ? levothyroxine  88 mcg Oral QAC breakfast  ? losartan  75 mg Oral Daily  ? megestrol  400 mg Oral BID  ? melatonin  1.5 mg Oral QHS  ? multivitamin with minerals  1 tablet Oral Daily  ? nystatin   Topical BID  ? pioglitazone  30 mg Oral Daily  ? polyethylene glycol   17 g Oral Daily  ? sertraline  25 mg Oral Daily  ? ?Continuous: ? ? ?EHM:[id number] **OR** acetaminophen (TYLENOL) oral liquid 160 mg/5 mL **OR** acetaminophen, sorbitol ? ?Antibiotics: ?Anti-infectives (From admission, onward)  ? ? None  ? ?  ? ? ?Objective: ? ?Vital Signs ? ?Vitals:  ? 06/05/21 0533 06/05/21 1407 06/05/21 2048 06/06/21 0536  ?BP: (!) 168/67 (!) 144/65 (!) 143/62 (!) 142/64  ?Pulse: 67 73 73 73  ?Resp:  '16 16 18  '$ ?Temp:  98.4 ?F (36.9 ?C) 98.1 ?F (36.7 ?C) 98.2 ?F (36.8 ?C)  ?TempSrc:   Oral   ?SpO2: 100% 97% 99% 98%  ?Weight:      ?Height:      ? ? ?Intake/Output Summary (Last 24 hours) at 06/06/2021 1043 ?Last data filed at 06/06/2021 [id number] ?Gross per 24 hour  ?Intake 600 ml  ?Output --  ?Net 600 ml  ? ? ?Filed Weights  ? 05/25/21 1615 06/01/21 1523  ?Weight: 75.4 kg 73.2 kg  ? ? ?General appearance: Awake alert.  In no distress ?Resp: Clear to auscultation bilaterally.  Normal effort ?Cardio: S1-S2 is normal regular.  No S3-S4.  No rubs murmurs or bruit ?GI: Abdomen is soft.  Nontender nondistended.  Bowel sounds are present normal.  No masses organomegaly ? ?Lab Results: ? ?Data Reviewed: I have personally reviewed following labs and reports of the imaging studies ? ?CBC: ?Recent Labs  ?Lab 05/30/21 ?1432 06/02/21 ?5465 06/03/21 ?1340 06/05/21 ?0354  ?WBC 6.8 11.3* 10.7* 7.0  ?NEUTROABS  --  9.1*  --   --   ?HGB 13.6 13.7 15.0 13.2  ?HCT 40.9 38.8* 46.0 38.4*  ?MCV 94.2 91.9 95.2 93.2  ?PLT 252 268 281 277  ? ? ? ?Basic Metabolic Panel: ?Recent Labs  ?Lab 05/30/21 ?1432 05/31/21 ?0505 06/01/21 ?6568 06/02/21 ?1275 06/03/21 ?1340 06/05/21 ?1700  ?NA 140 140  --  137 138 137  ?K 4.3 4.2  --  4.0 4.1 3.9  ?CL 108 109  --  109 107 109  ?CO2 22 22  --  21* 21* 23  ?GLUCOSE 217* 141*  --  153* 169* 188*  ?BUN 28* 23  --  23 21 24*  ?CREATININE 0.83 0.79 0.85 0.81 0.85 0.77  ?CALCIUM 10.1 9.7  --  9.5 10.0 9.5  ? ? ? ?GFR: ?Estimated Creatinine Clearance: 66.1 mL/min (by C-G formula based on SCr of  0.77 mg/dL). ? ?Liver Function Tests: ?Recent Labs  ?Lab 06/03/21 ?1340  ?AST 22  ?ALT 18  ?ALKPHOS 50  ?BILITOT 0.8  ?PROT 7.1  ?ALBUMIN 3.4*  ? ? ? ?Recent Labs  ?Lab 06/03/21 ?1340  ?AMMONIA <10  ? ? ? ? ? ?CBG: ?Recent Labs  ?Lab 06/05/21 ?1749 06/05/21 ?1136 06/05/21 ?1632 06/05/21 ?2101 06/06/21 ?4496  ?GLUCAP 173* 239* 235* 250* 215*  ? ? ? ? ? ?Anemia Panel: ?Recent Labs  ?  06/03/21 ?1340  ?[id number]  ? ? ? ?Recent Results (from the past 240 hour(s))  ?Urine Culture     Status: Abnormal  ? Collection Time: 05/30/21  1:35 PM  ? Specimen: Urine, Clean Catch  ?Result Value Ref Range Status  ? Specimen Description URINE, CLEAN CATCH  Final  ? Special Requests   Final  ?  NONE ?Performed at [facility], [place] [address], [place], [place] [address] ?  ? Culture MULTIPLE SPECIES PRESENT, SUGGEST RECOLLECTION (A)  Final  ? Report Status 05/31/2021 FINAL  Final  ?Urine Culture     Status: None  ? Collection Time: 06/02/21  6:12 PM  ? Specimen: In/Out Cath Urine  ?Result Value Ref Range Status  ? Specimen Description IN/OUT CATH URINE  Final  ? Special Requests NONE  Final  ? Culture   Final  ?  NO GROWTH ?Performed at [facility], [place] [address] [place], [place] [address] ?  ? Report Status 06/03/2021 FINAL  Final  ? ?  ? ? ?Radiology Studies: ?No results found. ? ? ? ? LOS: 12 days  ? ?Bonnielee Haff ? ?[facility] ?Pager on [email or web address] ? ?06/06/2021, 10:43 AM ? ? ?

## 2021-06-06 NOTE — Progress Notes (Addendum)
Physical Therapy Session Note ? ?Patient Details  ?Name: Alan Rubio ?MRN: [id number] ?Date of Birth: [birth year] ? ?Today's Date: 06/06/2021 ?PT Individual Time: 1415-1500 ?PT Individual Time Calculation (min): 45 min  ? ?Short Term Goals: ?Week 1:  PT Short Term Goal 1 (Week 1): Pt will perform supine<>sit with min assist ?PT Short Term Goal 1 - Progress (Week 1): Not met ?PT Short Term Goal 2 (Week 1): Pt will perform sit<>stands using LRAD with min assist ?PT Short Term Goal 2 - Progress (Week 1): Not met ?PT Short Term Goal 3 (Week 1): Pt will perform bed<>chair transfers using LRAD with min assist ?PT Short Term Goal 3 - Progress (Week 1): Not met ?PT Short Term Goal 4 (Week 1): Pt will ambulate at least 69ft using LRAD with CGA ?PT Short Term Goal 4 - Progress (Week 1): Not met ?PT Short Term Goal 5 (Week 1): Pt will participate in standardized outcome measure assessing his balance ?PT Short Term Goal 5 - Progress (Week 1): Not met ?Week 2:  PT Short Term Goal 1 (Week 2): Pt will perform supine<>sit with min assist ?PT Short Term Goal 2 (Week 2): Pt will perform sit<>stands using LRAD with min assist ?PT Short Term Goal 3 (Week 2): Pt will perform bed<>chair transfers using LRAD with min assist ?PT Short Term Goal 4 (Week 2): Pt will ambulate at least 32ft using LRAD with CGA ?PT Short Term Goal 5 (Week 2): Pt will initiate stair navigation ?Week 3:    ? ?Skilled Therapeutic Interventions/Progress Updates:  ?Pain - pt c/o R foot "hurting"  10/10 R foot, treatment to tolerance, nursing aware ?Pt initially in thigh high teds and refusing session, therapist able to encourage pt to sit at edge of bed. ? ?  Supine BP 144/52, hr 69 ?Supine to side to sit w/max assist, max cues for sequencing, encouragment to participate in task. ? ?Sitting BP 144/66  hr 87 ?134/63 ? ?Pt w/post lean, mod assist initially fading to cga and cues for ant wt shift. ?Sit to stand in stedy w/bed elevated and max assist from bedlevel. ?Once  perched in stedy, pt able to stand 5X from stedy seat, remain standing up to 2 min at a time, c/o R foolt pain limiting standing tolerance.  Engaged in standing during brief change, sitting in stedy for gown change, nursing in to assist w/linen change due to urinary incontinence. ? ?Stand in stedy to sit on bed w/min assist. ?Sit to side to supne w/mod assist, max cues for sequencing, max encouragment to participate/not rely on therapist. ?Ted hose removed. ?Pt left supine w/rails up x 3, alarm set, bed in lowest position, and needs in reach. ? ? ?Therapy Documentation ?Precautions:  ?Precautions ?Precautions: Fall, Other (comment) ?Precaution Comments: HOH, orthostatic ?Restrictions ?Weight Bearing Restrictions: No ? ? ? ?Therapy/Group: Individual Therapy ?Callie Fielding, PT ? ? ?Jerrilyn Cairo ?06/06/2021, 2:57 PM  ?

## 2021-06-06 NOTE — Progress Notes (Signed)
?                                                       PROGRESS NOTE ? ? ?Subjective/Complaints: ? ?Working with SLP this AM. No new concerns. Says "that's good" when eating.  ? ?ROS: Limited due to cognitive/behavioral  ?No fevers, chills, CP, SOB, abd pain, N/V/D/C, no HA ? ? ?Objective: ?  ?No results found. ?Recent Labs  ?  06/03/21 ?1340 06/05/21 ?8119  ?WBC 10.7* 7.0  ?HGB 15.0 13.2  ?HCT 46.0 38.4*  ?PLT 281 277  ? ? ? ?Recent Labs  ?  06/03/21 ?1340 06/05/21 ?1478  ?NA 138 137  ?K 4.1 3.9  ?CL 107 109  ?CO2 21* 23  ?GLUCOSE 169* 188*  ?BUN 21 24*  ?CREATININE 0.85 0.77  ?CALCIUM 10.0 9.5  ? ? ? ?Intake/Output Summary (Last 24 hours) at 06/06/2021 0913 ?Last data filed at 06/06/2021 [phone] ?Gross per 24 hour  ?Intake 600 ml  ?Output --  ?Net 600 ml  ? ?  ? ?  ? ?Physical Exam: ?Vital Signs ?Blood pressure (!) 142/64, pulse 73, temperature 98.2 ?F (36.8 ?C), resp. rate 18, height 6' (1.829 m), weight 73.2 kg, SpO2 98 %. ? ? ?General: No acute distress,Hard of hearing ?Mood and affect are appropriate ?Heart: Regular rate and rhythm no rubs murmurs or extra sounds ?Lungs: Clear to auscultation, breathing unlabored, no rales or wheezes ?Abdomen: Positive bowel sounds, soft nontender to palpation, nondistended ?Extremities: No clubbing, cyanosis, or edema ?Awake and alert ?Musculoskeletal: Full range of motion in all 4 extremities. No joint swelling. Decreased tenderness R trapezius. ? ? ?Assessment/Plan: ?1. Functional deficits which require 3+ hours per day of interdisciplinary therapy in a comprehensive inpatient rehab setting. ?Physiatrist is providing close team supervision and 24 hour management of active medical problems listed below. ?Physiatrist and rehab team continue to assess barriers to discharge/monitor patient progress toward functional and medical goals ? ?Care Tool: ? ?Bathing ?   ?Body parts bathed by patient: Right arm, Left arm  ? Body parts bathed by helper: Right arm, Right lower leg, Left  lower leg, Left arm, Chest, Face, Abdomen, Front perineal area, Buttocks, Right upper leg, Left upper leg ?  ?  ?Bathing assist Assist Level: Total Assistance - Patient < 25% ?  ?  ?Upper Body Dressing/Undressing ?Upper body dressing   ?What is the patient wearing?: Pull over shirt ?   ?Upper body assist Assist Level: Total Assistance - Patient < 25% ?   ?Lower Body Dressing/Undressing ?Lower body dressing ? ? ?   ?What is the patient wearing?: Pants ? ?  ? ?Lower body assist Assist for lower body dressing: Dependent - Patient 0% ?   ? ?Toileting ?Toileting    ?Toileting assist Assist for toileting: Total Assistance - Patient < 25% ?  ?  ?Transfers ?Chair/bed transfer ? ?Transfers assist ?   ? ?Chair/bed transfer assist level: Dependent - mechanical lift ?Chair/bed transfer assistive device: Armrests, Walker ?  ?Locomotion ?Ambulation ? ? ?Ambulation assist ? ?   ? ?Assist level: 2 helpers ?Assistive device: Walker-rolling ?Max distance: 83f  ? ?Walk 10 feet activity ? ? ?Assist ? Walk 10 feet activity did not occur: Safety/medical concerns (required use of AD) ? ?  ?   ? ?Walk 50 feet activity ? ? ?  Assist Walk 50 feet with 2 turns activity did not occur: Safety/medical concerns (required use of AD) ? ?  ?   ? ? ?Walk 150 feet activity ? ? ?Assist Walk 150 feet activity did not occur: Safety/medical concerns ? ?  ?  ?  ? ?Walk 10 feet on uneven surface  ?activity ? ? ?Assist Walk 10 feet on uneven surfaces activity did not occur: Safety/medical concerns ? ? ?  ?   ? ?Wheelchair ? ? ? ? ?Assist Is the patient using a wheelchair?: Yes (used for transport to/from gym) ?Type of Wheelchair: Manual ?  ? ?Wheelchair assist level: Dependent - Patient 0% ?   ? ? ?Wheelchair 50 feet with 2 turns activity ? ? ? ?Assist ? ?  ?  ? ? ?Assist Level: Dependent - Patient 0%  ? ?Wheelchair 150 feet activity  ? ? ? ?Assist ?   ? ? ?Assist Level: Dependent - Patient 0%  ? ?Blood pressure (!) 142/64, pulse 73, temperature 98.2 ?F (36.8  ?C), resp. rate 18, height 6' (1.829 m), weight 73.2 kg, SpO2 98 %. ? ?1. 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            -patient may shower ?            -ELOS/Goals: 5/17 ?            -Continue CIR therapies including PT, OT, and SLP   ? -Seen by Dr Sima Matas yesterday, appreciate assistance ? -CT head 5/10 with no new changes ?2.  Antithrombotics: ?-DVT/anticoagulation:  Pharmaceutical: Lovenox ?            -antiplatelet therapy: Aspirin 81 mg daily and Plavix 75 mg daily x3 weeks then Plavix alone ?Plavix alone as of 5/19 ?3. Pain Management: Tylenol as needed ?4. Mood: Melatonin 1.5 mg nightly provide emotional support ?            -antipsychotic agents: N/A ?5. Neuropsych: This patient is capable of making decisions on his own behalf. ?6. Skin/Wound Care: Routine skin checks ?7. Fluids/Electrolytes/Nutrition: Routine in and outs with follow-up chemistries ?BMET ok, per family poor appetite at home  ? ?  Latest Ref Rng & Units 06/05/2021  ?  5:48 AM 06/03/2021  ?  1:40 PM 06/02/2021  ?  5:49 AM  ?BMP  ?Glucose 70 - 99 mg/dL 188   169   153    ?BUN 8 - 23 mg/dL '24   21   23    '$ ?Creatinine 0.61 - 1.24 mg/dL 0.77   0.85   0.81    ?Sodium 135 - 145 mmol/L 137   138   137    ?Potassium 3.5 - 5.1 mmol/L 3.9   4.1   4.0    ?Chloride 98 - 111 mmol/L 109   107   109    ?CO2 22 - 32 mmol/L '23   21   21    '$ ?Calcium 8.9 - 10.3 mg/dL 9.5   10.0   9.5    ?  ?8.  Type 2 diabetes mellitus.  Hemoglobin A1c 7.7.  Jardiance 25 mg daily as well as SSI.  Actos 15 mg daily on hold resumed 5/5 ?CBG (last 3)  ? ?Recent Labs  ?  06/05/21 ?1632 06/05/21 ?2101 06/06/21 ?0634  ?GLUCAP 235*  250* 215*  ? ? 5/9 elevated , increase actos  ? 5/13 CBG elevated, IM following and restarted jardiance,  Megestrol dose decreased ?9.   Hypertension.  Well controlled, continue Cozaar 50 mg daily, Cardura 4 mg daily ?Vitals:  ? 06/05/21 2048 06/06/21 0536  ?BP: (!) 143/62 (!) 142/64  ?Pulse: 73 73  ?Resp: 16 18  ?Temp: 98.1 ?F (36.7 ?C) 98.2 ?F (36.8 ?C)  ?SpO2: 99% 98%  ?Cardura changed to '2mg'$  Q 12 due to lability   ?Increase Cozaar on 5/9 to '75mg'$  qam, monitor effect  ?-5/13 Cardura held by IM team, now restarted at '4mg'$  at bedtime, appreciate assistance ?10.  Hyperlipidemia.  Continue Lipitor 40 mg daily, Zetia 10 mg daily ?11.  History of gout.  Continue Zyloprim 300 mg daily.  Monitor for any gout flareups ?12.  Hypothyroidism.  Continue Synthroid ? 13.  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  Freq small smears per RN, may need enema for retained stool , negative KUB ? -Large BM noted this AM, continue to monitor ?15. Orthostatic hypotension ? --IM following appreciate assitance ? -Cardura and Jardiance held by IM, IV fluids were given with likely  ?benefit, continue to monitor.   ?-5/13 cardura and jardiance restarted by IM ?16. Acute metabolic encephalopathy ? -IM following appreciate assistance ? -Workup with Head CT without changes. B12, Urine culture, TSH unremarkable ? -Mentation appears improved after IV fluids ?17. R neck pain, suspect related to positioning with preference for left side, discussed positioning, continue Voltaren gel, tylenol PRN ? -improved ? ?LOS: ?12 days ?A FACE TO FACE EVALUATION WAS PERFORMED ? ?Jennye Boroughs ?06/06/2021, 9:13 AM  ? ? ? ?

## 2021-06-06 NOTE — Plan of Care (Signed)
?  Problem: Consults ?Goal: RH STROKE PATIENT EDUCATION ?Description: See Patient Education module for education specifics  ?Outcome: Progressing ?  ?Problem: RH BOWEL ELIMINATION ?Goal: RH STG MANAGE BOWEL WITH ASSISTANCE ?Description: STG Manage Bowel with mod I  Assistance. ?Outcome: Progressing ?Goal: RH STG MANAGE BOWEL W/MEDICATION W/ASSISTANCE ?Description: STG Manage Bowel with Medication with mod I Assistance. ?Outcome: Progressing ?  ?Problem: RH BLADDER ELIMINATION ?Goal: RH STG MANAGE BLADDER WITH ASSISTANCE ?Description: STG Manage Bladder With mod I /toileting Assistance ?Outcome: Progressing ?Goal: RH STG MANAGE BLADDER WITH MEDICATION WITH ASSISTANCE ?Description: STG Manage Bladder With Medication With mod I  Assistance. ?Outcome: Progressing ?  ?Problem: RH SAFETY ?Goal: RH STG ADHERE TO SAFETY PRECAUTIONS W/ASSISTANCE/DEVICE ?Description: STG Adhere to Safety Precautions With cues Assistance/Device. ?Outcome: Progressing ?  ?Problem: RH KNOWLEDGE DEFICIT ?Goal: RH STG INCREASE KNOWLEDGE OF DIABETES ?Description: Patient will be able to manage DM with medications and dietary modifications using handouts and educational resources independently ?Outcome: Progressing ?Goal: RH STG INCREASE KNOWLEDGE OF HYPERTENSION ?Description: Patient will be able to manage HTN with medications and dietary modifications using handouts and educational resources independently ?Outcome: Progressing ?Goal: RH STG INCREASE KNOWLEGDE OF HYPERLIPIDEMIA ?Description: Patient will be able to manage HLD with medications and dietary modifications using handouts and educational resources independently ?Outcome: Progressing ?Goal: RH STG INCREASE KNOWLEDGE OF STROKE PROPHYLAXIS ?Description: Patient will be able to manage secondary stroke risks with medications and dietary modifications using handouts and educational resources independently ?Outcome: Progressing ?  ?

## 2021-06-06 NOTE — Progress Notes (Signed)
Occupational Therapy Session Note ? ?Patient Details  ?Name: Alan Rubio ?MRN: [id number] ?Date of Birth: 09/07/1931 ? ?Today's Date: 06/06/2021 ?OT Individual Time: 1194-1740;   [id number] ?OT Individual Time Calculation (min): 20 min, 26 min  ? ? ?Short Term Goals: ?Week 2:  OT Short Term Goal 1 (Week 2): Pt will complete sit<>stand using RW with min assist in prep for LB ADLs ?OT Short Term Goal 2 (Week 2): Pt will complete stand pivot from slightly elevated surface <> BSC with mod assist in prep for toileting ?OT Short Term Goal 3 (Week 2): Pt will pull up and pants over hips while standing with min assist. ? ?Skilled Therapeutic Interventions/Progress Updates:  ?  AM Session:  Patient received supine in bed minimally alert.  Wife and family in room upon arrival.  Family took opportunity to leave during therapy session.  Patient in reclined supine position with several pillows supporting head position.  Patient reporting neck pain.  Patient had recently been given pain medication-Tylenol.  Patient agreeable to moist heat on neck.  Placed warm compress while attempting to wake patient up for therapy.  Patient initially reported heat felt good on neck.  Patient unable to maintain arousal, and after several attempts to encourage patient out of bed - ended session early. Heat removed and patient allowed to sleep - nursing aware.   ? ?PM Session:  Patient received supine in bed.  Eyes open, television on without sound.  Patient indicating neck felt slightly better.  Patient declined multiple times and very clearly that he did not wish to get out of bed.  Patient indicated thirst - sat patient up and in midline alignment and supervised him drinking a soda.  Patient indicated surprise that he was able to read crawler on TV.  Reading only right aspect of sentence.  When cued to scan all the way left he could make out words.  Did several attempts to refocus eyes.  Patient had newspaper in room - with glasses on patient able to  read headlines - and part of "funny papers."  Patient more alert and responding verbally this session, as long as physical status not challenged.  PT entering at end of this session.  ? ?Therapy Documentation ?Precautions:  ?Precautions ?Precautions: Fall, Other (comment) ?Precaution Comments: HOH, orthostatic ?Restrictions ?Weight Bearing Restrictions: No ?General: ?General ?OT Amount of Missed Time: 40 Minutes ?Vital Signs: ?Therapy Vitals ?Temp: 98.4 ?F (36.9 ?C) ?Pulse Rate: 78 ?Resp: 16 ?BP: 138/64 ?Patient Position (if appropriate): Lying ?Oxygen Therapy ?SpO2: 96 % ?O2 Device: Room Air ?Pain: ?Pain Assessment ?Pain Scale: 0-10 ?Pain Score: 5/10 ?Pain Type: Acute pain ?Pain Location: Neck ?Pain Orientation: Posterior ?Pain Descriptors / Indicators: Aching ?Pain Frequency: Intermittent ?Pain Onset: Gradual ?Patients Stated Pain Goal: 2 ?Pain Intervention(s): Medication (See eMAR) ?Multiple Pain Sites: No ? ? ? ? ?Therapy/Group: Individual Therapy ? ?Mariah Milling ?06/06/2021, 2:39 PM ?

## 2021-06-06 NOTE — Progress Notes (Signed)
Speech Language Pathology Daily Session Note ? ?Patient Details  ?Name: Alan Rubio ?MRN: [id number] ?Date of Birth: 01-11-1932 ? ?Today's Date: 06/06/2021 ?SLP Individual Time: 7902-4097 ?SLP Individual Time Calculation (min): 48 min ? ?Short Term Goals: ?Week 2: SLP Short Term Goal 1 (Week 2): Patient will consume current diet with minimal overt s/s of aspiration and min A cues for safe swallowing precautions/strategies ?SLP Short Term Goal 2 (Week 2): Patient will orient x4 with sup-to-min A for use of external aids ?SLP Short Term Goal 3 (Week 2): Patient will complete basic-to-mildly complex problem solving tasks with min A verbal/visual cues ?SLP Short Term Goal 4 (Week 2): Patient will utilize external memory aids to increase functional recall with min-to-mod A verbal/visual cues ?SLP Short Term Goal 5 (Week 2): Patient will implement speech intelligiblity strategies at the phrase/sentence level with min-to-mod A verbal cues to reach 75% intelligiblity ? ?Skilled Therapeutic Interventions: Skilled treatment session focused on dysphagia and speech intelligibility goals. Upon arrival, patient was asleep in bed but easily awakened. SLP repositioned patient to maximize arousal and overall safety with PO intake. Patient declined his breakfast of Dys. 2 textures, therefore, SLP provided trials of Dys. 3 textures (muffin and cup of fruit). Patient demonstrated mildly prolonged but efficient mastication with mld oral residue. Patient with intermittent coughing when utilizing liquid washes to clear residue, suspect due to take sequential sips via straw despite Max verbal and tactile cues. Recommend patient remain on current diet with ongoing trials of Dys. 3 textures WITH thin liquids to assess safety and maximize utilization of small sips. Throughout session, patient asking the same question multiple times. Patient also engaged in basic social exchange at the phrase and sentence level. Patient was ~90% intelligible and  was able to self-correct errors when cued by SLP. Patient left upright in bed with alarm on and all needs within reach. Continue with current plan of care.  ?   ? ?Pain ?No/Denies Pain  ? ?Therapy/Group: Individual Therapy ? ?Maydelin Deming ?06/06/2021, 3:21 PM ?

## 2021-06-07 LAB — GLUCOSE, CAPILLARY
Glucose-Capillary: 143 mg/dL — ABNORMAL HIGH (ref 70–99)
Glucose-Capillary: 199 mg/dL — ABNORMAL HIGH (ref 70–99)
Glucose-Capillary: 205 mg/dL — ABNORMAL HIGH (ref 70–99)
Glucose-Capillary: 263 mg/dL — ABNORMAL HIGH (ref 70–99)

## 2021-06-07 NOTE — Progress Notes (Signed)
?                                                       PROGRESS NOTE ? ? ?Subjective/Complaints: ? ?Laying in bed this AM. No new concerns.  ? ?ROS: Limited due to cognitive/behavioral  ?Denies Fever, chills, Nausea, dizziness ? ? ?Objective: ?  ?No results found. ?Recent Labs  ?  06/05/21 ?1610  ?WBC 7.0  ?HGB 13.2  ?HCT 38.4*  ?PLT 277  ? ? ? ?Recent Labs  ?  06/05/21 ?9604  ?NA 137  ?K 3.9  ?CL 109  ?CO2 23  ?GLUCOSE 188*  ?BUN 24*  ?CREATININE 0.77  ?CALCIUM 9.5  ? ? ? ?Intake/Output Summary (Last 24 hours) at 06/07/2021 0740 ?Last data filed at 06/06/2021 1843 ?Gross per 24 hour  ?Intake 240 ml  ?Output --  ?Net 240 ml  ? ?  ? ?  ? ?Physical Exam: ?Vital Signs ?Blood pressure (!) 157/73, pulse 66, temperature 97.8 ?F (36.6 ?C), resp. rate 18, height 6' (1.829 m), weight 73.2 kg, SpO2 100 %. ? ? ?General: No acute distress,Hard of hearing, in bed ?Mood and affect are appropriate ?Heart: Regular rate and rhythm no rubs murmurs or extra sounds ?Lungs: Clear to auscultation, breathing unlabored, no rales or wheezes ?Abdomen: Positive bowel sounds, soft nontender to palpation, nondistended ?Extremities: No clubbing, cyanosis, or edema ?Awake and alert ?Musculoskeletal: Full range of motion in all 4 extremities. No joint swelling. MIld tenderness R trapezius. Better posture today ? ? ?Assessment/Plan: ?1. Functional deficits which require 3+ hours per day of interdisciplinary therapy in a comprehensive inpatient rehab setting. ?Physiatrist is providing close team supervision and 24 hour management of active medical problems listed below. ?Physiatrist and rehab team continue to assess barriers to discharge/monitor patient progress toward functional and medical goals ? ?Care Tool: ? ?Bathing ?   ?Body parts bathed by patient: Right arm, Left arm  ? Body parts bathed by helper: Right arm, Right lower leg, Left lower leg, Left arm, Chest, Face, Abdomen, Front perineal area, Buttocks, Right upper leg, Left upper leg ?  ?   ?Bathing assist Assist Level: Total Assistance - Patient < 25% ?  ?  ?Upper Body Dressing/Undressing ?Upper body dressing   ?What is the patient wearing?: Pull over shirt ?   ?Upper body assist Assist Level: Total Assistance - Patient < 25% ?   ?Lower Body Dressing/Undressing ?Lower body dressing ? ? ?   ?What is the patient wearing?: Pants ? ?  ? ?Lower body assist Assist for lower body dressing: Dependent - Patient 0% ?   ? ?Toileting ?Toileting    ?Toileting assist Assist for toileting: Total Assistance - Patient < 25% ?  ?  ?Transfers ?Chair/bed transfer ? ?Transfers assist ?   ? ?Chair/bed transfer assist level: Dependent - mechanical lift ?Chair/bed transfer assistive device: Armrests, Walker ?  ?Locomotion ?Ambulation ? ? ?Ambulation assist ? ?   ? ?Assist level: 2 helpers ?Assistive device: Walker-rolling ?Max distance: 58f  ? ?Walk 10 feet activity ? ? ?Assist ? Walk 10 feet activity did not occur: Safety/medical concerns (required use of AD) ? ?  ?   ? ?Walk 50 feet activity ? ? ?Assist Walk 50 feet with 2 turns activity did not occur: Safety/medical concerns (required use of AD) ? ?  ?   ? ? ?  Walk 150 feet activity ? ? ?Assist Walk 150 feet activity did not occur: Safety/medical concerns ? ?  ?  ?  ? ?Walk 10 feet on uneven surface  ?activity ? ? ?Assist Walk 10 feet on uneven surfaces activity did not occur: Safety/medical concerns ? ? ?  ?   ? ?Wheelchair ? ? ? ? ?Assist Is the patient using a wheelchair?: Yes (used for transport to/from gym) ?Type of Wheelchair: Manual ?  ? ?Wheelchair assist level: Dependent - Patient 0% ?   ? ? ?Wheelchair 50 feet with 2 turns activity ? ? ? ?Assist ? ?  ?  ? ? ?Assist Level: Dependent - Patient 0%  ? ?Wheelchair 150 feet activity  ? ? ? ?Assist ?   ? ? ?Assist Level: Dependent - Patient 0%  ? ?Blood pressure (!) 157/73, pulse 66, temperature 97.8 ?F (36.6 ?C), resp. rate 18, height 6' (1.829 m), weight 73.2 kg, SpO2 100 %. ? ?1. Functional deficits secondary to  left pontine infarct likely secondary to small vessel disease as well as history of CVA 03/2018 with bilateral multifocal infarct including left thalamic scattered right frontal, right MCA/ACA and left MCA/PCA infarct ?            -patient may shower ?            -ELOS/Goals: 5/17 ?            -Continue CIR therapies including PT, OT, and SLP   ? -Seen by Dr Sima Matas yesterday, appreciate assistance ? -CT head 5/10 with no new changes ?2.  Antithrombotics: ?-DVT/anticoagulation:  Pharmaceutical: Lovenox ?            -antiplatelet therapy: Aspirin 81 mg daily and Plavix 75 mg daily x3 weeks then Plavix alone ?Plavix alone as of 5/19 ?3. Pain Management: Tylenol as needed ?4. Mood: Melatonin 1.5 mg nightly provide emotional support ?            -antipsychotic agents: N/A ?5. Neuropsych: This patient is capable of making decisions on his own behalf. ?6. Skin/Wound Care: Routine skin checks ?7. Fluids/Electrolytes/Nutrition: Routine in and outs with follow-up chemistries ?BMET ok, per family poor appetite at home  ? ?  Latest Ref Rng & Units 06/05/2021  ?  5:48 AM 06/03/2021  ?  1:40 PM 06/02/2021  ?  5:49 AM  ?BMP  ?Glucose 70 - 99 mg/dL 188   169   153    ?BUN 8 - 23 mg/dL '24   21   23    '$ ?Creatinine 0.61 - 1.24 mg/dL 0.77   0.85   0.81    ?Sodium 135 - 145 mmol/L 137   138   137    ?Potassium 3.5 - 5.1 mmol/L 3.9   4.1   4.0    ?Chloride 98 - 111 mmol/L 109   107   109    ?CO2 22 - 32 mmol/L '23   21   21    '$ ?Calcium 8.9 - 10.3 mg/dL 9.5   10.0   9.5    ?  ?8.  Type 2 diabetes mellitus.  Hemoglobin A1c 7.7.  Jardiance 25 mg daily as well as SSI.  Actos 15 mg daily on hold resumed 5/5 ?CBG (last 3)  ? ?Recent Labs  ?  06/06/21 ?1654 06/06/21 ?2059 06/07/21 ?8756  ?GLUCAP 181* 140* 143*  ? ? 5/9 elevated , increase actos  ? 5/13 CBG elevated, IM following and restarted jardiance,  Megestrol dose decreased ?  5/14 CBGs overall improved, continue current treatment and monitor ?9.  Hypertension.  Well controlled, continue  Cozaar 50 mg daily, Cardura 4 mg daily ?Vitals:  ? 06/07/21 0452 06/07/21 1348  ?BP: (!) 157/73 (!) 144/67  ?Pulse: 66 67  ?Resp: 18 16  ?Temp: 97.8 ?F (36.6 ?C) 97.7 ?F (36.5 ?C)  ?SpO2: 100% 98%  ?Cardura changed to '2mg'$  Q 12 due to lability   ?Increase Cozaar on 5/9 to '75mg'$  qam, monitor effect  ?-5/13 Cardura was held by IM team, now restarted at '4mg'$  at bedtime, appreciate assistance ?-BP a little high but overall stable, continue current medications ?10.  Hyperlipidemia.  Continue Lipitor 40 mg daily, Zetia 10 mg daily ?11.  History of gout.  Continue Zyloprim 300 mg daily.  Monitor for any gout flareups ?12.  Hypothyroidism.  Continue Synthroid ? 13.  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  Freq small smears per RN, may need enema for retained stool , negative KUB ? -2 Large BM noted on 5/11, denies constipation, continue to follow ?15. Orthostatic hypotension ? -Cardura and Jardiance held by IM, IV fluids were given with likely  ?benefit, continue to monitor.   ?-5/13 cardura and jardiance restarted by IM, pt appears to be improved, Pt appears to tolerate therapy better now ?16. Acute metabolic encephalopathy ? -Workup with Head CT without changes. B12, Urine culture, TSH unremarkable ? -Mentation appears improved after IV fluids ? -IM signed off today, appreciate assistance ?17. R neck pain, suspect related to positioning with preference for left side, discussed positioning, continue Voltaren gel, tylenol PRN ? -improved ? ?LOS: ?13 days ?A FACE TO FACE EVALUATION WAS PERFORMED ? ?Alan Rubio ?06/07/2021, 7:40 AM  ? ? ? ?

## 2021-06-07 NOTE — Progress Notes (Signed)
Occupational Therapy Session Note ? ?Patient Details  ?Name: Ojani Luallen ?MRN: [id number] ?Date of Birth: 01/12/1932 ? ?Today's Date: 06/07/2021 ?OT Individual Time: 0905-1005 ?OT Individual Time Calculation (min): 60 min  ? ? ?Short Term Goals: ?Week 1:  OT Short Term Goal 1 (Week 1): Pt will complete sit<>stand using RW with min assist in prep for LB ADLs ?OT Short Term Goal 1 - Progress (Week 1): Progressing toward goal ?OT Short Term Goal 2 (Week 1): Pt will complete stand pivot <> BSC with mod assist in prep for toileting ?OT Short Term Goal 2 - Progress (Week 1): Not met ?OT Short Term Goal 3 (Week 1): Pt will donn/doff pull up and pants with min assist. ?OT Short Term Goal 3 - Progress (Week 1): Not met ?OT Short Term Goal 4 (Week 1): Pt will bathe LB at sit<>stand level using LHS and shower bench as needed with min assist. ?OT Short Term Goal 4 - Progress (Week 1): Not met ?Week 2:  OT Short Term Goal 1 (Week 2): Pt will complete sit<>stand using RW with min assist in prep for LB ADLs ?OT Short Term Goal 2 (Week 2): Pt will complete stand pivot from slightly elevated surface <> BSC with mod assist in prep for toileting ?OT Short Term Goal 3 (Week 2): Pt will pull up and pants over hips while standing with min assist. ? ?Skilled Therapeutic Interventions/Progress Updates:  ?  Patient in bed upon arrival with c/o neck pain, pt indicated that his pain response was a 8 on a 0-10 scale, the pt indicated that he had notified nursing of his situation.  The pt went on to complete BADL at bed LOF for bathing his UB face, chest, arms, and stomach requiring heavy cues and ModA for UB and MaxA for LB task completion. Inclusive of his  perineal and bottom. The pt was MaxA for UB/LB dressing inclusive of ted hose.  The pt presented with challenges for transferring from supine to sidelying on the left and right secondary to strength and activity tolerance requiring MaxA. , further, he was unable to incorporate the bed rails to  assist in shifting his body position.  The pt was MaxA for grooming task of washing his dentures as well. The pt remained in bed with his bedside table and call light within reach and his alarm activated. All additional needs were addressed prior to exiting the room.  ? ?Therapy Documentation ?Precautions:  ?Precautions ?Precautions: Fall, Other (comment) ?Precaution Comments: HOH, orthostatic ?Restrictions ?Weight Bearing Restrictions: No ? ? ?Therapy/Group: Individual Therapy ? ?Lorenda D Jackson ?06/07/2021, 6:10 PM ?

## 2021-06-07 NOTE — Plan of Care (Signed)
?  Problem: Consults ?Goal: RH STROKE PATIENT EDUCATION ?Description: See Patient Education module for education specifics  ?Outcome: Progressing ?  ?Problem: RH BOWEL ELIMINATION ?Goal: RH STG MANAGE BOWEL WITH ASSISTANCE ?Description: STG Manage Bowel with mod I  Assistance. ?Outcome: Progressing ?Goal: RH STG MANAGE BOWEL W/MEDICATION W/ASSISTANCE ?Description: STG Manage Bowel with Medication with mod I Assistance. ?Outcome: Progressing ?  ?Problem: RH BLADDER ELIMINATION ?Goal: RH STG MANAGE BLADDER WITH ASSISTANCE ?Description: STG Manage Bladder With mod I /toileting Assistance ?Outcome: Progressing ?Goal: RH STG MANAGE BLADDER WITH MEDICATION WITH ASSISTANCE ?Description: STG Manage Bladder With Medication With mod I  Assistance. ?Outcome: Progressing ?  ?Problem: RH SAFETY ?Goal: RH STG ADHERE TO SAFETY PRECAUTIONS W/ASSISTANCE/DEVICE ?Description: STG Adhere to Safety Precautions With cues Assistance/Device. ?Outcome: Progressing ?  ?Problem: RH PAIN MANAGEMENT ?Goal: RH STG PAIN MANAGED AT OR BELOW PT'S PAIN GOAL ?Description: At or below level 4 w prns ?Outcome: Progressing ?  ?Problem: RH KNOWLEDGE DEFICIT ?Goal: RH STG INCREASE KNOWLEDGE OF DIABETES ?Description: Patient will be able to manage DM with medications and dietary modifications using handouts and educational resources independently ?Outcome: Progressing ?Goal: RH STG INCREASE KNOWLEDGE OF HYPERTENSION ?Description: Patient will be able to manage HTN with medications and dietary modifications using handouts and educational resources independently ?Outcome: Progressing ?Goal: RH STG INCREASE KNOWLEGDE OF HYPERLIPIDEMIA ?Description: Patient will be able to manage HLD with medications and dietary modifications using handouts and educational resources independently ?Outcome: Progressing ?Goal: RH STG INCREASE KNOWLEDGE OF STROKE PROPHYLAXIS ?Description: Patient will be able to manage secondary stroke risks with medications and dietary  modifications using handouts and educational resources independently ?Outcome: Progressing ?  ?

## 2021-06-07 NOTE — Progress Notes (Addendum)
? ?[facility] ?PROGRESS NOTE ? ? ?Alan Rubio GXQ:[id number] DOB: [birth year] DOA: 05/25/2021 ? ?PCP: [name], MD ? ?Brief History/Interval Summary: 88 y.o. 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  Hospitalist was consulted due to recent decline in his functional status despite getting rehab.  Some concern for alteration in mental status.   ? ? ?Subjective/Interval History: ?Patient sitting up in the bed.  Denies any shortness of breath or chest pain.  No nausea vomiting.   ? ? ?Assessment/Plan: ? ?Acute metabolic encephalopathy ?Reason for his confusion is not entirely clear.  According to nursing staff his mentation did improve after he was given IV fluids.  Not noted to have any neurological deficits.  Seems to be back to baseline now. ?CT head did not show any new findings. Noted to be afebrile.  WBC is normal.  Urine culture was negative.  Chest x-ray did not show any acute findings.   ?Ammonia level normal.  B12 level normal.  TSH was normal recently. ?Patient could have been hypovolemic which would explain the improvement in mentation after IV fluids.   ?No additional testing at this time.   ?  ?Orthostatic hypotension ?Apparently has blood pressures went from 144 systolic to 90 systolic when he went from a sitting to standing position.  This is not documented anywhere.   ?TED stockings were prescribed.  No further orthostatics have been performed.  Patient seems to be stable.  Tolerating reintroduction of Jardiance doxazosin and ARB. ?  ?Essential hypertension ?Prior to admission patient was on doxazosin.  And was also on losartan 50 mg daily. ?Blood pressure was noted to be elevated here.  This was complicated by the fact that he was noted to be orthostatic. ?Dose of losartan has been increased slightly.  Doxazosin  being given only at bedtime.   ?Blood pressure seems to be better.  Will not be too aggressive in this elderly patient.  Blood pressure stable. ? ?Hypothyroidism ?TSH recently checked on 4/28 and wnl ?Continue synthroid at 61mg daily  ?  ?Left pontine cerebrovascular accident ([facility] ?CT head without any new findings.  Continue aspirin Plavix statin. ?  ?[place] ?Recent A1C of 7.7 ?Elevated CBGs likely due to megestrol.  Dose was decreased by primary team. ?CBGs seem to be better today.  Continue current dose of Actos and Jardiance.  Was resumed yesterday.  Seems to be tolerating it well. ?  ?Leukocytosis ?Likely reactive/concentrated. No signs of infection or fever. ? ?Looks like plan is for placement to skilled nursing facility sometime next week.   ? ?Patient remains medically stable.  Continue losartan and doxazosin and Jardiance at current dose.  CBGs are better.  We will go ahead and sign off at this time. Discussed with attending, Dr. SCurlene Dolphin ?  ?  ? ?Medications: Scheduled: ? allopurinol  300 mg Oral Daily  ? aspirin EC  81 mg Oral Daily  ? atorvastatin  40 mg Oral Daily  ? clopidogrel  75 mg Oral Daily  ? diclofenac Sodium  2 g Topical QID  ? docusate sodium  100 mg Oral BID  ? doxazosin  4 mg Oral QHS  ? empagliflozin  10 mg Oral Daily  ? enoxaparin (LOVENOX) injection  40 mg Subcutaneous Q24H  ? ezetimibe  10 mg Oral Daily  ? insulin aspart  0-9 Units Subcutaneous TID WC  ? levothyroxine  88 mcg Oral QAC breakfast  ? losartan  75 mg Oral Daily  ? megestrol  200 mg Oral BID  ? melatonin  1.5 mg Oral QHS  ? multivitamin with minerals  1 tablet Oral Daily  ? nystatin   Topical BID  ? pioglitazone  30 mg Oral Daily  ? polyethylene glycol  17 g Oral Daily  ? sertraline  25 mg Oral Daily  ? ?Continuous: ? ? ?DPO:[id number] **OR** acetaminophen (TYLENOL) oral liquid 160 mg/5 mL **OR** acetaminophen, sorbitol ? ?Antibiotics: ?Anti-infectives (From admission, onward)  ? ? None  ? ?   ? ? ?Objective: ? ?Vital Signs ? ?Vitals:  ? 06/06/21 1311 06/06/21 1934 06/06/21 2100 06/07/21 0452  ?BP: 138/64  (!) 151/69 (!) 157/73  ?Pulse: 78 80 70 66  ?Resp: '16 19  18  '$ ?Temp: 98.4 ?F (36.9 ?C) 97.9 ?F (36.6 ?C)  97.8 ?F (36.6 ?C)  ?TempSrc:  Oral    ?SpO2: 96% 100%  100%  ?Weight:      ?Height:      ? ? ?Intake/Output Summary (Last 24 hours) at 06/07/2021 1047 ?Last data filed at 06/07/2021 0753 ?Gross per 24 hour  ?Intake 117 ml  ?Output --  ?Net 117 ml  ? ? ?Filed Weights  ? 05/25/21 1615 06/01/21 1523  ?Weight: 75.4 kg 73.2 kg  ? ? ?General appearance: Awake alert.  In no distress ?Resp: Clear to auscultation bilaterally.  Normal effort ?Cardio: S1-S2 is normal regular.  No S3-S4.  No rubs murmurs or bruit ?GI: Abdomen is soft.  Nontender nondistended.  Bowel sounds are present normal.  No masses organomegaly ? ? ?Lab Results: ? ?Data Reviewed: I have personally reviewed following labs and reports of the imaging studies ? ?CBC: ?Recent Labs  ?Lab 06/02/21 ?4008 06/03/21 ?1340 06/05/21 ?6761  ?WBC 11.3* 10.7* 7.0  ?NEUTROABS 9.1*  --   --   ?HGB 13.7 15.0 13.2  ?HCT 38.8* 46.0 38.4*  ?MCV 91.9 95.2 93.2  ?PLT 268 281 277  ? ? ? ?Basic Metabolic Panel: ?Recent Labs  ?Lab 06/01/21 ?9509 06/02/21 ?3267 06/03/21 ?1340 06/05/21 ?1245  ?NA  --  137 138 137  ?K  --  4.0 4.1 3.9  ?CL  --  109 107 109  ?CO2  --  21* 21* 23  ?GLUCOSE  --  153* 169* 188*  ?BUN  --  23 21 24*  ?CREATININE 0.85 0.81 0.85 0.77  ?CALCIUM  --  9.5 10.0 9.5  ? ? ? ?GFR: ?Estimated Creatinine Clearance: 66.1 mL/min (by C-G formula based on SCr of 0.77 mg/dL). ? ?Liver Function Tests: ?Recent Labs  ?Lab 06/03/21 ?1340  ?AST 22  ?ALT 18  ?ALKPHOS 50  ?BILITOT 0.8  ?PROT 7.1  ?ALBUMIN 3.4*  ? ? ? ?Recent Labs  ?Lab 06/03/21 ?1340  ?AMMONIA <10  ? ? ? ? ? ?CBG: ?Recent Labs  ?Lab 06/06/21 ?0634 06/06/21 ?1134 06/06/21 ?1654 06/06/21 ?2059 06/07/21 ?8099  ?GLUCAP 215* 315* 181* 140* 143*  ? ? ? ?Recent Results (from the past 240 hour(s))  ?Urine  Culture     Status: Abnormal  ? Collection Time: 05/30/21  1:35 PM  ? Specimen: Urine, Clean Catch  ?Result Value Ref Range Status  ? Specimen Description URINE, CLEAN CATCH  Final  ? Special Requests   Final  ?  NONE ?Performed at [facility], [place] [address] [place], [place] [address] ?  ? Culture  MULTIPLE SPECIES PRESENT, SUGGEST RECOLLECTION (A)  Final  ? Report Status 05/31/2021 FINAL  Final  ?Urine Culture     Status: None  ? Collection Time: 06/02/21  6:12 PM  ? Specimen: In/Out Cath Urine  ?Result Value Ref Range Status  ? Specimen Description IN/OUT CATH URINE  Final  ? Special Requests NONE  Final  ? Culture   Final  ?  NO GROWTH ?Performed at [facility], [place] [address] [place], [place] [address] ?  ? Report Status 06/03/2021 FINAL  Final  ? ?  ? ? ?Radiology Studies: ?No results found. ? ? ? ? LOS: 13 days  ? ?Bonnielee Haff ? ?[facility] ?Pager on [email or web address] ? ?06/07/2021, 10:47 AM ? ? ?

## 2021-06-07 NOTE — Progress Notes (Signed)
Physical Therapy Session Note ? ?Patient Details  ?Name: Alan Rubio ?MRN: [id number] ?Date of Birth: [birth year] ? ?Today's Date: 06/07/2021 ?PT Individual Time: 7341-9379 and 0240-9735 ?PT Individual Time Calculation (min): 59 min and 30 min. ? ?Short Term Goals: ?Week 2:  PT Short Term Goal 1 (Week 2): Pt will perform supine<>sit with min assist ?PT Short Term Goal 2 (Week 2): Pt will perform sit<>stands using LRAD with min assist ?PT Short Term Goal 3 (Week 2): Pt will perform bed<>chair transfers using LRAD with min assist ?PT Short Term Goal 4 (Week 2): Pt will ambulate at least 37f using LRAD with CGA ?PT Short Term Goal 5 (Week 2): Pt will initiate stair navigation ? ?Skilled Therapeutic Interventions/Progress Updates:  ? ?First session:  Pt presents supine in bed and arousable for therapy.  Spouse and son present in room.  BP monitored during session w/ initial supine BP of 148/69 (MAP 93 and HR of 69 w/ THT donned.  Pt performed rolling to L w/ max A for hand over hand to reach R hand to opposite side rail.  Pt requires mod A for hooklying position.  Pt requires max A for sidelying to sitting EOB and able to maintain upright sitting w/ verbal cues and occasional min A.  BP monitored at 160/79 (MAP 102) and HR of 78.  Pt agreeable to standing w/ use of Stedy.  Pt able to lift LEs onto [facility] w/ only minimal A and verbal cues for posterior bias.  Pt required mod A to correctly place hands on front bar, pt seemed to be grasping at bar, but unsure of position.  Pt required max to Total A from slightly elevated bed height.  Pt able to stand using front bar for BP monitor of 122/57 (MAP 77) and HR 92 w/ pt commenting feels better.  Pt sat on perch and able to transfer sit to stand from this position w/ min to mod A.  Pt stood multiple trials of 2-3' w/ min A and verbal cues for firing of gluts for upright stance.  Pt agreed to transfer to recliner, but pt incontinent of urine on floor and returned to bed for  care.  Pt required max A for sit to sidelying using B hands on rail.  Pt rolled multiple trials w/ same assist especially for LE positioning for new bedding as well as clean brief.  Pt remained in bed w/ bed alarm on and all needs in reach.  Son present during entire session. ? ?Second session:  Pt presents supine in bed and agreeable to therapy.  Pt wishes to remain in bed and agrees to participate in bed level there ex.  Pt performed AP 3 x 15, abd/add w/ AAROM 3 x 5-10, HS and SAQ all w/ rest breaks between sets.  Pt remained in bed w/ bed alarm on and all needs in reach.  Pt requesting ginger ale but none in room.  PT offered water.  Spoke w/ NT and will check w/ appropriateness of request. ?   ? ?Therapy Documentation ?Precautions:  ?Precautions ?Precautions: Fall, Other (comment) ?Precaution Comments: HOH, orthostatic ?Restrictions ?Weight Bearing Restrictions: No ?General: ?  ?Vital Signs: ?Therapy Vitals ?Temp: 97.7 ?F (36.5 ?C) ?Pulse Rate: 67 ?Resp: 16 ?BP: (!) 144/67 ?Patient Position (if appropriate): Lying ?Oxygen Therapy ?SpO2: 98 % ?O2 Device: Room Air ?Pain: c/o neck pain but unable to quantify. ?  ? ? ? ?Therapy/Group: Individual Therapy ? ?Alan Rubio?06/07/2021, 3:07 PM  ?

## 2021-06-08 LAB — CREATININE, SERUM
Creatinine, Ser: 1.01 mg/dL (ref 0.61–1.24)
GFR, Estimated: >60 mL/min (ref >60)

## 2021-06-08 LAB — GLUCOSE, CAPILLARY
Glucose-Capillary: 225 mg/dL — ABNORMAL HIGH (ref 70–99)
Glucose-Capillary: 265 mg/dL — ABNORMAL HIGH (ref 70–99)
Glucose-Capillary: 258 mg/dL — ABNORMAL HIGH (ref 70–99)
Glucose-Capillary: 276 mg/dL — ABNORMAL HIGH (ref 70–99)

## 2021-06-08 MED ORDER — POLYETHYLENE GLYCOL 3350 17 G PO PACK
17.0000 g | PACK | Freq: Two times a day (BID) | ORAL | Status: DC
  Administered 2021-06-08 – 2021-06-09 (×2): 17 g via ORAL
  Filled 2021-06-08 (×2): qty 1

## 2021-06-08 MED ORDER — EMPAGLIFLOZIN 10 MG PO TABS: 10.0000 mg | ORAL_TABLET | Freq: Every day | ORAL | Status: AC

## 2021-06-08 MED ORDER — MEGESTROL ACETATE 400 MG/10ML PO SUSP: 200.0000 mg | Freq: Two times a day (BID) | ORAL | 0 refills | Status: AC

## 2021-06-08 MED ORDER — MELATONIN 300 MCG PO TABS: 1.5000 mg | ORAL_TABLET | Freq: Every day | ORAL | 0 refills | Status: AC

## 2021-06-08 MED ORDER — LOSARTAN POTASSIUM 25 MG PO TABS: 75.0000 mg | ORAL_TABLET | Freq: Every day | ORAL | Status: DC

## 2021-06-08 MED ORDER — PIOGLITAZONE HCL 30 MG PO TABS
30.0000 mg | ORAL_TABLET | Freq: Every day | ORAL | Status: AC
Start: 2021-06-09 — End: ?

## 2021-06-08 MED ORDER — FLEET ENEMA 7-19 GM/118ML RE ENEM
1.0000 | ENEMA | Freq: Once | RECTAL | Status: DC
Start: 2021-06-08 — End: 2021-06-09

## 2021-06-08 MED ORDER — HYDROCORTISONE (PERIANAL) 2.5 % EX CREA: TOPICAL_CREAM | Freq: Three times a day (TID) | CUTANEOUS | 0 refills | Status: DC

## 2021-06-08 MED ORDER — METHYLPHENIDATE HCL 5 MG PO TABS: 5.0000 mg | ORAL_TABLET | Freq: Two times a day (BID) | ORAL | 0 refills | Status: AC

## 2021-06-08 MED ORDER — DICLOFENAC SODIUM 1 % EX GEL: 2.0000 g | Freq: Four times a day (QID) | CUTANEOUS | Status: AC

## 2021-06-08 MED ORDER — CLOPIDOGREL BISULFATE 75 MG PO TABS: 75.0000 mg | ORAL_TABLET | Freq: Every day | ORAL | Status: AC

## 2021-06-08 MED ORDER — ADULT MULTIVITAMIN W/MINERALS CH: 1.0000 | ORAL_TABLET | Freq: Every day | ORAL | Status: AC

## 2021-06-08 MED ORDER — HYDROCORTISONE (PERIANAL) 2.5 % EX CREA
TOPICAL_CREAM | Freq: Three times a day (TID) | CUTANEOUS | Status: DC
  Filled 2021-06-08: qty 28.35

## 2021-06-08 MED ORDER — SERTRALINE HCL 25 MG PO TABS: 25.0000 mg | ORAL_TABLET | Freq: Every day | ORAL | Status: AC

## 2021-06-08 MED ORDER — DOXAZOSIN MESYLATE 4 MG PO TABS: 4.0000 mg | ORAL_TABLET | Freq: Every day | ORAL | Status: AC

## 2021-06-08 MED ORDER — POLYETHYLENE GLYCOL 3350 17 G PO PACK: 17.0000 g | PACK | Freq: Two times a day (BID) | ORAL | 0 refills | Status: AC

## 2021-06-08 MED ORDER — ATORVASTATIN CALCIUM 40 MG PO TABS: 40.0000 mg | ORAL_TABLET | Freq: Every day | ORAL | Status: AC

## 2021-06-08 MED ORDER — SORBITOL 70 % SOLN
60.0000 mL | Status: AC
  Administered 2021-06-08: 60 mL via ORAL
  Filled 2021-06-08: qty 60

## 2021-06-08 MED ORDER — METHYLPHENIDATE HCL 5 MG PO TABS
5.0000 mg | ORAL_TABLET | Freq: Two times a day (BID) | ORAL | Status: DC
  Administered 2021-06-08 – 2021-06-09 (×2): 5 mg via ORAL
  Filled 2021-06-08 (×2): qty 1

## 2021-06-08 MED ORDER — NYSTATIN 100000 UNIT/GM EX POWD
Freq: Two times a day (BID) | CUTANEOUS | 0 refills | Status: AC
Start: 2021-06-08 — End: ?

## 2021-06-08 NOTE — Progress Notes (Addendum)
?                                                       PROGRESS NOTE ? ? ?Subjective/Complaints: ? ?No issues overnite , still with poor initiation followed by perseveration  ?Off amantadine  ? ?ROS: Limited due to cognitive/behavioral  ?Denies Fever, chills, Nausea, dizziness ? ? ?Objective: ?  ?No results found. ?No results for input(s): WBC, HGB, HCT, PLT in the last 72 hours. ? ? ?Recent Labs  ?  06/08/21 ?0622  ?CREATININE 1.01  ? ? ? ?Intake/Output Summary (Last 24 hours) at 06/08/2021 0845 ?Last data filed at 06/07/2021 2015 ?Gross per 24 hour  ?Intake 294 ml  ?Output --  ?Net 294 ml  ? ?  ? ?  ? ?Physical Exam: ?Vital Signs ?Blood pressure 135/71, pulse 73, temperature 98.5 ?F (36.9 ?C), temperature source Oral, resp. rate 18, height 6' (1.829 m), weight 73.2 kg, SpO2 98 %. ? ? ?General: No acute distress,Hard of hearing, in bed ?Mood and affect are appropriate ?Heart: Regular rate and rhythm no rubs murmurs or extra sounds ?Lungs: Clear to auscultation, breathing unlabored, no rales or wheezes ?Abdomen: Positive bowel sounds, soft nontender to palpation, nondistended ?Extremities: No clubbing, cyanosis, or edema ?Awake and alert ?Musculoskeletal: Full range of motion in all 4 extremities. No joint swelling. MIld tenderness R trapezius. Better posture today ? ? ?Assessment/Plan: ?1. Functional deficits which require 3+ hours per day of interdisciplinary therapy in a comprehensive inpatient rehab setting. ?Physiatrist is providing close team supervision and 24 hour management of active medical problems listed below. ?Physiatrist and rehab team continue to assess barriers to discharge/monitor patient progress toward functional and medical goals ? ?Care Tool: ? ?Bathing ?   ?Body parts bathed by patient: Right arm, Left arm  ? Body parts bathed by helper: Right arm, Right lower leg, Left lower leg, Left arm, Chest, Face, Abdomen, Front perineal area, Buttocks, Right upper leg, Left upper leg ?  ?  ?Bathing  assist Assist Level: Total Assistance - Patient < 25% ?  ?  ?Upper Body Dressing/Undressing ?Upper body dressing   ?What is the patient wearing?: Pull over shirt ?   ?Upper body assist Assist Level: Total Assistance - Patient < 25% ?   ?Lower Body Dressing/Undressing ?Lower body dressing ? ? ?   ?What is the patient wearing?: Pants ? ?  ? ?Lower body assist Assist for lower body dressing: Dependent - Patient 0% ?   ? ?Toileting ?Toileting    ?Toileting assist Assist for toileting: Total Assistance - Patient < 25% ?  ?  ?Transfers ?Chair/bed transfer ? ?Transfers assist ?   ? ?Chair/bed transfer assist level: Dependent - mechanical lift ?Chair/bed transfer assistive device: Armrests, Walker ?  ?Locomotion ?Ambulation ? ? ?Ambulation assist ? ?   ? ?Assist level: 2 helpers ?Assistive device: Walker-rolling ?Max distance: 28f  ? ?Walk 10 feet activity ? ? ?Assist ? Walk 10 feet activity did not occur: Safety/medical concerns (required use of AD) ? ?  ?   ? ?Walk 50 feet activity ? ? ?Assist Walk 50 feet with 2 turns activity did not occur: Safety/medical concerns (required use of AD) ? ?  ?   ? ? ?Walk 150 feet activity ? ? ?Assist Walk 150 feet activity did not occur: Safety/medical concerns ? ?  ?  ?  ? ?  Walk 10 feet on uneven surface  ?activity ? ? ?Assist Walk 10 feet on uneven surfaces activity did not occur: Safety/medical concerns ? ? ?  ?   ? ?Wheelchair ? ? ? ? ?Assist Is the patient using a wheelchair?: Yes (used for transport to/from gym) ?Type of Wheelchair: Manual ?  ? ?Wheelchair assist level: Dependent - Patient 0% ?   ? ? ?Wheelchair 50 feet with 2 turns activity ? ? ? ?Assist ? ?  ?  ? ? ?Assist Level: Dependent - Patient 0%  ? ?Wheelchair 150 feet activity  ? ? ? ?Assist ?   ? ? ?Assist Level: Dependent - Patient 0%  ? ?Blood pressure 135/71, pulse 73, temperature 98.5 ?F (36.9 ?C), temperature source Oral, resp. rate 18, height 6' (1.829 m), weight 73.2 kg, SpO2 98 %. ? ?1. Functional deficits  secondary to left pontine infarct likely secondary to small vessel disease as well as history of CVA 03/2018 with bilateral multifocal infarct including left thalamic scattered right frontal, right MCA/ACA and left MCA/PCA infarct ?            -patient may shower ?            -ELOS/Goals: 5/17 ?            -Continue CIR therapies including PT, OT, and SLP   ? -Seen by Dr Sima Matas yesterday, appreciate assistance ? -CT head 5/10 with no new changes ?Trial methylphenidate low dose  ?2.  Antithrombotics: ?-DVT/anticoagulation:  Pharmaceutical: Lovenox ?            -antiplatelet therapy: Aspirin 81 mg daily and Plavix 75 mg daily x3 weeks then Plavix alone ?Plavix alone as of 5/19 ?3. Pain Management: Tylenol as needed ?4. Mood: Melatonin 1.5 mg nightly provide emotional support ?            -antipsychotic agents: N/A ?5. Neuropsych: This patient is capable of making decisions on his own behalf. ?6. Skin/Wound Care: Routine skin checks ?7. Fluids/Electrolytes/Nutrition: Routine in and outs with follow-up chemistries ?BMET ok, per family poor appetite at home  ? ?  Latest Ref Rng & Units 06/08/2021  ?  6:22 AM 06/05/2021  ?  5:48 AM 06/03/2021  ?  1:40 PM  ?BMP  ?Glucose 70 - 99 mg/dL  188   169    ?BUN 8 - 23 mg/dL  24   21    ?Creatinine 0.61 - 1.24 mg/dL 1.01   0.77   0.85    ?Sodium 135 - 145 mmol/L  137   138    ?Potassium 3.5 - 5.1 mmol/L  3.9   4.1    ?Chloride 98 - 111 mmol/L  109   107    ?CO2 22 - 32 mmol/L  23   21    ?Calcium 8.9 - 10.3 mg/dL  9.5   10.0    ?  ?8.  Type 2 diabetes mellitus.  Hemoglobin A1c 7.7.  Jardiance 25 mg daily as well as SSI.  Actos 15 mg daily on hold resumed 5/5 ?CBG (last 3)  ? ?Recent Labs  ?  06/07/21 ?1650 06/07/21 ?2115 06/08/21 ?9024  ?GLUCAP 205* 263* 225*  ? ? 5/9 elevated , increase actos  ? 5/13 CBG elevated, IM following and restarted jardiance,  Megestrol dose decreased ? 5/14 CBGs up intake still poor  ?9.  Hypertension.  Well controlled, continue Cozaar 50 mg daily,  Cardura 4 mg daily ?Vitals:  ? 06/07/21 1921 06/08/21 0517  ?  BP: (!) 157/70 135/71  ?Pulse: 89 73  ?Resp: 16 18  ?Temp: 98.3 ?F (36.8 ?C) 98.5 ?F (36.9 ?C)  ?SpO2:  98%  ?Cardura changed to '2mg'$  Q 12 due to lability   ?Increase Cozaar on 5/9 to '75mg'$  qam, monitor effect  ?-5/13 Cardura was held by IM team, now restarted at '4mg'$  at bedtime, appreciate assistance some lability  ?10.  Hyperlipidemia.  Continue Lipitor 40 mg daily, Zetia 10 mg daily ?11.  History of gout.  Continue Zyloprim 300 mg daily.  Monitor for any gout flareups ?12.  Hypothyroidism.  Continue Synthroid ? 13.  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  Freq small smears per RN, may need enema for retained stool , negative KUB ? -2 Large BM noted on 5/11, denies constipation, continue to follow ?15. Orthostatic hypotension ? -Cardura and Jardiance held by IM, IV fluids were given with likely  ?benefit, continue to monitor.   ?-5/13 cardura and jardiance restarted by IM, pt appears to be improved, Pt appears to tolerate therapy better now ?16. Acute metabolic encephalopathy ? -Workup with Head CT without changes. B12, Urine culture, TSH unremarkable ? -Mentation appears improved after IV fluids ? -IM signed off appreciate assistance ?17. R neck pain, suspect related to positioning with preference for left side, discussed positioning, continue Voltaren gel, tylenol PRN ? -improved ? ?LOS: ?14 days ?A FACE TO FACE EVALUATION WAS PERFORMED ? ?Alan Rubio ?06/08/2021, 8:45 AM  ? ? ? ?

## 2021-06-08 NOTE — Progress Notes (Signed)
Occupational Therapy Session Note ? ?Patient Details  ?Name: Alan Rubio ?MRN: [id number] ?Date of Birth: 07/18/1931 ? ?Today's Date: 06/08/2021 ?OT Individual Time: 0815-0845 ?OT Individual Time Calculation (min): 30 min  ? ? ?Short Term Goals: ?Week 2:  OT Short Term Goal 1 (Week 2): Pt will complete sit<>stand using RW with min assist in prep for LB ADLs ?OT Short Term Goal 2 (Week 2): Pt will complete stand pivot from slightly elevated surface <> BSC with mod assist in prep for toileting ?OT Short Term Goal 3 (Week 2): Pt will pull up and pants over hips while standing with min assist. ? ?Skilled Therapeutic Interventions/Progress Updates:  ?  Pt received supine with no c/o pain, agreeable to OT session. Pt alert and oriented, agreeable to eat breakfast. His bed was placed into the chair position with the HOB at 66 degrees. He had a L lean that was corrected with a pillow wedged beside him to ensure proper positioning to reduce aspiration risk- required repositioning several times. He required full supervision with frequent intervention to manage liquid consumption via straw- frequent coughing with large sips, requiring OT to pull straw away after small sip. He was able to self feed breakfast with occasional assist for loading spoon. Frequent prolonged mastication with oral wash helpful. He requested assist with oral care and removed his dentures for OT to clean and apply new polygrip. He was able to put them back in with min A. Pt was left supine with all needs met, bed alarm set, and call bell within reach.  ? ? ? ?Therapy Documentation ?Precautions:  ?Precautions ?Precautions: Fall, Other (comment) ?Precaution Comments: HOH, orthostatic ?Restrictions ?Weight Bearing Restrictions: No ? ?Therapy/Group: Individual Therapy ? ?Sandra H Davis ?06/08/2021, 6:22 AM ?

## 2021-06-08 NOTE — Progress Notes (Signed)
Patient reporting "pain in his butt". Formed stool noted at rectal opening. Administered Sorbitol 60 per orders. Initially passed one hard stool with therapy. Upon further assessment patient passed medium formed stool with notable small amounts of blood. Provider notified.  ?

## 2021-06-08 NOTE — Progress Notes (Incomplete Revision)
Patient ID: Alan Rubio, male   DOB: Aug 13, 1931, 88 y.o.   MRN: [id number] ? ? ?[facility] can no longer offer the patient a bed due to bed availability. Sw will follow up with patient son and spouse on other bed offers. Family will [facility] today at 2:30PM.  ?

## 2021-06-08 NOTE — Progress Notes (Signed)
COVID swab completed and sent to lab.  ?

## 2021-06-08 NOTE — Progress Notes (Signed)
Physical Therapy Session Note ? ?Patient Details  ?Name: Alan Rubio ?MRN: [id number] ?Date of Birth: 1931/09/11 ? ?Today's Date: 06/08/2021 ?PT Individual Time: 1000-1020 ?PT Individual Time Calculation (min): 20 min  and Today's Date: 06/08/2021 ?PT Missed Time: 25 Minutes ?Missed Time Reason: Patient fatigue;Pain ? ?Short Term Goals: ?Week 2:  PT Short Term Goal 1 (Week 2): Pt will perform supine<>sit with min assist ?PT Short Term Goal 2 (Week 2): Pt will perform sit<>stands using LRAD with min assist ?PT Short Term Goal 3 (Week 2): Pt will perform bed<>chair transfers using LRAD with min assist ?PT Short Term Goal 4 (Week 2): Pt will ambulate at least 44f using LRAD with CGA ?PT Short Term Goal 5 (Week 2): Pt will initiate stair navigation ? ?Skilled Therapeutic Interventions/Progress Updates:  ?  Patient received supine in bed, family at bedside. He reports significant amount of pain in his bottom and R posterolateral neck. When asked to rate the pain, patient said "substantial." PT alerting RN to patients pain levels. Patient declining any OOB activity or in bed activity due to this pain, but was agreeable to PT assist for repositioning. Patient rolling onto his L side with MaxA and verbal cues for reaching for bedrail. Positioned in semi-sidelying with pillow under his bottom and between his knees. PT applying heat pack with layers to neck to assist with pain management. Patient remaining in bed, bed alarm on, call light within reach, family at bedside.  ? ?Therapy Documentation ?Precautions:  ?Precautions ?Precautions: Fall, Other (comment) ?Precaution Comments: HOH, orthostatic ?Restrictions ?Weight Bearing Restrictions: No ? ? ? ? ?Therapy/Group: Individual Therapy ? ?JDebbora Dus?JDebbora Dus PT, DPT, CBIS ? ?06/08/2021, 7:53 AM  ?

## 2021-06-08 NOTE — Progress Notes (Signed)
Offered fleets enema to facilitate bowel emptying, patient declined. States that he is feeling better since passing stool x2.  ?

## 2021-06-08 NOTE — Progress Notes (Signed)
Patient ID: Alan Rubio, male   DOB: 03-01-1931, 88 y.o.   MRN: [id number] ? ?Patient will discharge to [facility] tomorrow. PTAR scheduled for 11AM. ?

## 2021-06-08 NOTE — Progress Notes (Signed)
Speech Language Pathology Daily Session Note ? ?Patient Details  ?Name: Alan Rubio ?MRN: [id number] ?Date of Birth: [birth year] ? ?Today's Date: 06/08/2021 ?SLP Individual Time: 1300-1400 ?SLP Individual Time Calculation (min): 60 min ? ?Short Term Goals: ?Week 2: SLP Short Term Goal 1 (Week 2): Patient will consume current diet with minimal overt s/s of aspiration and min A cues for safe swallowing precautions/strategies ?SLP Short Term Goal 2 (Week 2): Patient will orient x4 with sup-to-min A for use of external aids ?SLP Short Term Goal 3 (Week 2): Patient will complete basic-to-mildly complex problem solving tasks with min A verbal/visual cues ?SLP Short Term Goal 4 (Week 2): Patient will utilize external memory aids to increase functional recall with min-to-mod A verbal/visual cues ?SLP Short Term Goal 5 (Week 2): Patient will implement speech intelligiblity strategies at the phrase/sentence level with min-to-mod A verbal cues to reach 75% intelligiblity ? ?Skilled Therapeutic Interventions: Skilled ST treatment focused on cognitive goals. Pt was awake and in bed and expressed "help, my butt hurts" upon arrival. This was accompanied by periods of rather quick breathing and holding pt's breath. SLP provided verbal cues and modeled deep, sustained breaths in which pt responded well and breathing returned to normal. Per further assessment, SLP discovered pt was incontinent of urine, as well as small amount of bowel incontinence. SLP provided peri care with max A and donned new brief. Pt complained of rectum discomfort throughout session, commenting "it feels like something is stuck up my rear end." SLP with attempts to reposition throughout session which were inconsistently effective. Nurse made aware and currently addressing. Functional participation limited secondary to distraction from discomfort. Pt denied consideration of dys 3 trials. Pt agreeable to word generating task using stimuli from "Scattergories" game to  target sustained attention and speech intelligibility. Pt generated up to 5 words within categories with sup A verbal cues. Pt was perceived as 90% intelligible at the sentence and conversation level with intermittent sup A verbal cues to correct communication breakdown. Patient was left in bed with alarm activated and immediate needs within reach at end of session. Continue per current plan of care.   ?   ?Pain ?Pain Assessment ?Pain Scale: 0-10 ?Pain Score: 4  ?Pain Type: Acute pain ?Pain Location: Rectum ?Pain Descriptors / Indicators: Discomfort ?Pain Onset: Other (Comment) (intermittent) ?Pain Intervention(s): Repositioned;RN made aware;Distraction;Emotional support ?Multiple Pain Sites: No ? ?Therapy/Group: Individual Therapy ? ?Alan Rubio T Alan Rubio ?06/08/2021, 2:01 PM ?

## 2021-06-08 NOTE — Progress Notes (Addendum)
Patient ID: Alan Rubio, male   DOB: Mar 21, 1931, 88 y.o.   MRN: [id number] ? ? ?[facility] can no longer offer the patient a bed due to bed availability. Sw will follow up with patient son and spouse on other bed offers. Family will [facility] today at 2:30PM.  ?

## 2021-06-09 DIAGNOSIS — M109 Gout, unspecified: Secondary | ICD-10-CM | POA: Diagnosis not present

## 2021-06-09 DIAGNOSIS — Z20822 Contact with and (suspected) exposure to covid-19: Secondary | ICD-10-CM | POA: Diagnosis not present

## 2021-06-09 DIAGNOSIS — E46 Unspecified protein-calorie malnutrition: Secondary | ICD-10-CM | POA: Diagnosis not present

## 2021-06-09 DIAGNOSIS — R7989 Other specified abnormal findings of blood chemistry: Secondary | ICD-10-CM | POA: Diagnosis not present

## 2021-06-09 DIAGNOSIS — K920 Hematemesis: Secondary | ICD-10-CM | POA: Diagnosis not present

## 2021-06-09 DIAGNOSIS — I2602 Saddle embolus of pulmonary artery with acute cor pulmonale: Secondary | ICD-10-CM | POA: Diagnosis not present

## 2021-06-09 DIAGNOSIS — Z9189 Other specified personal risk factors, not elsewhere classified: Secondary | ICD-10-CM | POA: Diagnosis not present

## 2021-06-09 DIAGNOSIS — L89612 Pressure ulcer of right heel, stage 2: Secondary | ICD-10-CM | POA: Diagnosis not present

## 2021-06-09 DIAGNOSIS — I3139 Other pericardial effusion (noninflammatory): Secondary | ICD-10-CM | POA: Diagnosis not present

## 2021-06-09 DIAGNOSIS — M542 Cervicalgia: Secondary | ICD-10-CM | POA: Diagnosis not present

## 2021-06-09 DIAGNOSIS — I951 Orthostatic hypotension: Secondary | ICD-10-CM | POA: Diagnosis not present

## 2021-06-09 DIAGNOSIS — Z794 Long term (current) use of insulin: Secondary | ICD-10-CM | POA: Diagnosis not present

## 2021-06-09 DIAGNOSIS — K5900 Constipation, unspecified: Secondary | ICD-10-CM | POA: Diagnosis not present

## 2021-06-09 DIAGNOSIS — K5901 Slow transit constipation: Secondary | ICD-10-CM | POA: Diagnosis not present

## 2021-06-09 DIAGNOSIS — Z8673 Personal history of transient ischemic attack (TIA), and cerebral infarction without residual deficits: Secondary | ICD-10-CM | POA: Diagnosis not present

## 2021-06-09 DIAGNOSIS — D72829 Elevated white blood cell count, unspecified: Secondary | ICD-10-CM | POA: Diagnosis not present

## 2021-06-09 DIAGNOSIS — I468 Cardiac arrest due to other underlying condition: Secondary | ICD-10-CM | POA: Diagnosis not present

## 2021-06-09 DIAGNOSIS — M6281 Muscle weakness (generalized): Secondary | ICD-10-CM | POA: Diagnosis not present

## 2021-06-09 DIAGNOSIS — I2699 Other pulmonary embolism without acute cor pulmonale: Secondary | ICD-10-CM | POA: Diagnosis not present

## 2021-06-09 DIAGNOSIS — Z7401 Bed confinement status: Secondary | ICD-10-CM | POA: Diagnosis not present

## 2021-06-09 DIAGNOSIS — R092 Respiratory arrest: Secondary | ICD-10-CM | POA: Diagnosis not present

## 2021-06-09 DIAGNOSIS — K921 Melena: Secondary | ICD-10-CM | POA: Diagnosis not present

## 2021-06-09 DIAGNOSIS — R269 Unspecified abnormalities of gait and mobility: Secondary | ICD-10-CM | POA: Diagnosis not present

## 2021-06-09 DIAGNOSIS — L309 Dermatitis, unspecified: Secondary | ICD-10-CM | POA: Diagnosis not present

## 2021-06-09 DIAGNOSIS — Z880 Allergy status to penicillin: Secondary | ICD-10-CM | POA: Diagnosis not present

## 2021-06-09 DIAGNOSIS — Z823 Family history of stroke: Secondary | ICD-10-CM | POA: Diagnosis not present

## 2021-06-09 DIAGNOSIS — M6250 Muscle wasting and atrophy, not elsewhere classified, unspecified site: Secondary | ICD-10-CM | POA: Diagnosis not present

## 2021-06-09 DIAGNOSIS — D485 Neoplasm of uncertain behavior of skin: Secondary | ICD-10-CM | POA: Diagnosis not present

## 2021-06-09 DIAGNOSIS — I69398 Other sequelae of cerebral infarction: Secondary | ICD-10-CM | POA: Diagnosis not present

## 2021-06-09 DIAGNOSIS — R41841 Cognitive communication deficit: Secondary | ICD-10-CM | POA: Diagnosis not present

## 2021-06-09 DIAGNOSIS — R578 Other shock: Secondary | ICD-10-CM | POA: Diagnosis not present

## 2021-06-09 DIAGNOSIS — I10 Essential (primary) hypertension: Secondary | ICD-10-CM | POA: Diagnosis not present

## 2021-06-09 DIAGNOSIS — E039 Hypothyroidism, unspecified: Secondary | ICD-10-CM | POA: Diagnosis not present

## 2021-06-09 DIAGNOSIS — R000 Tachycardia, unspecified: Secondary | ICD-10-CM | POA: Diagnosis not present

## 2021-06-09 DIAGNOSIS — Z77090 Contact with and (suspected) exposure to asbestos: Secondary | ICD-10-CM | POA: Diagnosis not present

## 2021-06-09 DIAGNOSIS — Z66 Do not resuscitate: Secondary | ICD-10-CM | POA: Diagnosis not present

## 2021-06-09 DIAGNOSIS — Z809 Family history of malignant neoplasm, unspecified: Secondary | ICD-10-CM | POA: Diagnosis not present

## 2021-06-09 DIAGNOSIS — I2721 Secondary pulmonary arterial hypertension: Secondary | ICD-10-CM | POA: Diagnosis not present

## 2021-06-09 DIAGNOSIS — J9601 Acute respiratory failure with hypoxia: Secondary | ICD-10-CM | POA: Diagnosis not present

## 2021-06-09 DIAGNOSIS — R2681 Unsteadiness on feet: Secondary | ICD-10-CM | POA: Diagnosis not present

## 2021-06-09 DIAGNOSIS — I69998 Other sequelae following unspecified cerebrovascular disease: Secondary | ICD-10-CM | POA: Diagnosis not present

## 2021-06-09 DIAGNOSIS — I4891 Unspecified atrial fibrillation: Secondary | ICD-10-CM | POA: Diagnosis not present

## 2021-06-09 DIAGNOSIS — E871 Hypo-osmolality and hyponatremia: Secondary | ICD-10-CM | POA: Diagnosis not present

## 2021-06-09 DIAGNOSIS — R279 Unspecified lack of coordination: Secondary | ICD-10-CM | POA: Diagnosis not present

## 2021-06-09 DIAGNOSIS — K7200 Acute and subacute hepatic failure without coma: Secondary | ICD-10-CM | POA: Diagnosis not present

## 2021-06-09 DIAGNOSIS — F32A Depression, unspecified: Secondary | ICD-10-CM | POA: Diagnosis not present

## 2021-06-09 DIAGNOSIS — I639 Cerebral infarction, unspecified: Secondary | ICD-10-CM | POA: Diagnosis not present

## 2021-06-09 DIAGNOSIS — I469 Cardiac arrest, cause unspecified: Secondary | ICD-10-CM | POA: Diagnosis not present

## 2021-06-09 DIAGNOSIS — E872 Acidosis, unspecified: Secondary | ICD-10-CM | POA: Diagnosis not present

## 2021-06-09 DIAGNOSIS — R079 Chest pain, unspecified: Secondary | ICD-10-CM | POA: Diagnosis not present

## 2021-06-09 DIAGNOSIS — I21A1 Myocardial infarction type 2: Secondary | ICD-10-CM | POA: Diagnosis not present

## 2021-06-09 DIAGNOSIS — E1159 Type 2 diabetes mellitus with other circulatory complications: Secondary | ICD-10-CM | POA: Diagnosis not present

## 2021-06-09 DIAGNOSIS — Z09 Encounter for follow-up examination after completed treatment for conditions other than malignant neoplasm: Secondary | ICD-10-CM | POA: Diagnosis not present

## 2021-06-09 DIAGNOSIS — G9340 Encephalopathy, unspecified: Secondary | ICD-10-CM | POA: Diagnosis not present

## 2021-06-09 DIAGNOSIS — R1312 Dysphagia, oropharyngeal phase: Secondary | ICD-10-CM | POA: Diagnosis not present

## 2021-06-09 DIAGNOSIS — R531 Weakness: Secondary | ICD-10-CM | POA: Diagnosis not present

## 2021-06-09 DIAGNOSIS — G4720 Circadian rhythm sleep disorder, unspecified type: Secondary | ICD-10-CM | POA: Diagnosis not present

## 2021-06-09 DIAGNOSIS — I69321 Dysphasia following cerebral infarction: Secondary | ICD-10-CM | POA: Diagnosis not present

## 2021-06-09 DIAGNOSIS — R1310 Dysphagia, unspecified: Secondary | ICD-10-CM | POA: Diagnosis not present

## 2021-06-09 DIAGNOSIS — I2609 Other pulmonary embolism with acute cor pulmonale: Secondary | ICD-10-CM | POA: Diagnosis not present

## 2021-06-09 DIAGNOSIS — E119 Type 2 diabetes mellitus without complications: Secondary | ICD-10-CM | POA: Diagnosis not present

## 2021-06-09 DIAGNOSIS — I152 Hypertension secondary to endocrine disorders: Secondary | ICD-10-CM | POA: Diagnosis not present

## 2021-06-09 DIAGNOSIS — Z515 Encounter for palliative care: Secondary | ICD-10-CM | POA: Diagnosis not present

## 2021-06-09 DIAGNOSIS — R040 Epistaxis: Secondary | ICD-10-CM | POA: Diagnosis not present

## 2021-06-09 DIAGNOSIS — Z7902 Long term (current) use of antithrombotics/antiplatelets: Secondary | ICD-10-CM | POA: Diagnosis not present

## 2021-06-09 DIAGNOSIS — R112 Nausea with vomiting, unspecified: Secondary | ICD-10-CM | POA: Diagnosis not present

## 2021-06-09 DIAGNOSIS — E785 Hyperlipidemia, unspecified: Secondary | ICD-10-CM | POA: Diagnosis not present

## 2021-06-09 LAB — SARS CORONAVIRUS 2 (TAT 6-24 HRS): SARS Coronavirus 2: NEGATIVE

## 2021-06-09 LAB — GLUCOSE, CAPILLARY: Glucose-Capillary: 196 mg/dL — ABNORMAL HIGH (ref 70–99)

## 2021-06-09 NOTE — Progress Notes (Signed)
Inpatient Rehabilitation Care Coordinator ?Discharge Note  ? ?Patient Details  ?Name: Alan Rubio ?MRN: [id number] ?Date of Birth: 1931-11-19 ? ? ?Discharge location: SNF [facility]) ? ?Length of Stay: 15 Days ? ?Discharge activity level: MOD/MAX ? ?Home/community participation: Son and spouse ? ?Patient response IE:PPIRJJ Literacy - How often do you need to have someone help you when you read instructions, pamphlets, or other written material from your doctor or pharmacy?: Always ? ?Patient response OA:CZYSAY Isolation - How often do you feel lonely or isolated from those around you?: Rarely ? ?Services provided included: SW, Neuropsych, Pharmacy, TR, CM, RN, SLP, OT, PT, RD, MD ? ?Financial Services:  ?Charity fundraiser Utilized: Medicare ?  ? ?Choices offered to/list presented to: Patient son and spouse ? ?Follow-up services arranged:  ?  ?   ?  ?  ?  ? ?Patient response to transportation need: ?Is the patient able to respond to transportation needs?: Yes ?In the past 12 months, has lack of transportation kept you from medical appointments or from getting medications?: No ?In the past 12 months, has lack of transportation kept you from meetings, work, or from getting things needed for daily living?: No ? ? ? ?Comments (or additional information): ? ?Patient/Family verbalized understanding of follow-up arrangements:  Yes ? ?Individual responsible for coordination of the follow-up plan: Jeneen Rinks [phone] ? ?Confirmed correct DME delivered: Dyanne Iha 06/09/2021   ? ?Dyanne Iha ?

## 2021-06-09 NOTE — Progress Notes (Signed)
Inpatient Rehabilitation Discharge Medication Review by a Pharmacist ? ?A complete drug regimen review was completed for this patient to identify any potential clinically significant medication issues. ? ?High Risk Drug Classes Is patient taking? Indication by Medication  ?Antipsychotic No   ?Anticoagulant No   ?Antibiotic No   ?Opioid No   ?Antiplatelet Yes Aspirin, clopidogrel-CVA prophylaxis   ?Hypoglycemics/insulin Yes Empagliflozin, actos- T2DM  ?Vasoactive Medication Yes Doxazosin, losartan- hypertension  ?Chemotherapy No   ?Other Yes Atorvastatin-hyperlipidemia ?ezetimibe-hyperlipidemia ?Levothyroxine-hypothyroidism ?Allopurinol-gout ?Megace- weight loss ?Ritalin- alertness  ?Zoloft- MDD  ? ? ? ?Type of Medication Issue Identified Description of Issue Recommendation(s)  ?Drug Interaction(s) (clinically significant) ?    ?Duplicate Therapy ?    ?Allergy ?    ?No Medication Administration End Date ?    ?Incorrect Dose ?    ?Additional Drug Therapy Needed ?    ?Significant med changes from prior encounter (inform family/care partners about these prior to discharge).    ?Other ?    ? ? ?Clinically significant medication issues were identified that warrant physician communication and completion of prescribed/recommended actions by midnight of the next day:  No ? ? ?Time spent performing this drug regimen review (minutes):  30 ? ? ?Fong Mccarry BS, PharmD, BCPS ?Clinical Pharmacist ?06/09/2021 8:15 AM ? ?Contact: [phone] after 3 PM ? ?"Be curious, not judgmental..." -Jamal Maes ?

## 2021-06-09 NOTE — Progress Notes (Signed)
?                                                       PROGRESS NOTE ? ? ?Subjective/Complaints: ? ?Good BM x 2  ?Covid test negative  ? ?ROS: Limited due to cognitive/behavioral  ?Denies Fever, chills, Nausea, dizziness ? ? ?Objective: ?  ?No results found. ?No results for input(s): WBC, HGB, HCT, PLT in the last 72 hours. ? ? ?Recent Labs  ?  06/08/21 ?0622  ?CREATININE 1.01  ? ? ? ?Intake/Output Summary (Last 24 hours) at 06/09/2021 0737 ?Last data filed at 06/08/2021 2200 ?Gross per 24 hour  ?Intake 1115 ml  ?Output 0 ml  ?Net 1115 ml  ? ?  ? ?  ? ?Physical Exam: ?Vital Signs ?Blood pressure (!) 151/71, pulse 83, temperature 97.7 ?F (36.5 ?C), resp. rate 18, height 6' (1.829 m), weight 72.3 kg, SpO2 99 %. ? ? ?General: No acute distress,Hard of hearing, in bed ?Mood and affect are appropriate ?Heart: Regular rate and rhythm no rubs murmurs or extra sounds ?Lungs: Clear to auscultation, breathing unlabored, no rales or wheezes ?Abdomen: Positive bowel sounds, soft nontender to palpation, nondistended ?Extremities: No clubbing, cyanosis, or edema ?Awake and alert ?Musculoskeletal: Full range of motion in all 4 extremities. No joint swelling. MIld tenderness R trapezius. Better posture today ?4/5 in BUE and LLE, 3/5 RLE ? ?Assessment/Plan: ?1. Functional deficits due to Left pontine infarct  ?Stable for D/C today to SNF ?F/u PCP in 3-4 weeks ?F/u PM&R 2 weeks ?See D/C summary ?See D/C instructions  ?Care Tool: ? ?Bathing ?   ?Body parts bathed by patient: Right arm, Left arm  ? Body parts bathed by helper: Right arm, Right lower leg, Left lower leg, Left arm, Chest, Face, Abdomen, Front perineal area, Buttocks, Right upper leg, Left upper leg ?  ?  ?Bathing assist Assist Level: Total Assistance - Patient < 25% ?  ?  ?Upper Body Dressing/Undressing ?Upper body dressing   ?What is the patient wearing?: Pull over shirt ?   ?Upper body assist Assist Level: Total Assistance - Patient < 25% ?   ?Lower Body  Dressing/Undressing ?Lower body dressing ? ? ?   ?What is the patient wearing?: Pants ? ?  ? ?Lower body assist Assist for lower body dressing: Dependent - Patient 0% ?   ? ?Toileting ?Toileting    ?Toileting assist Assist for toileting: Total Assistance - Patient < 25% ?  ?  ?Transfers ?Chair/bed transfer ? ?Transfers assist ?   ? ?Chair/bed transfer assist level: Dependent - mechanical lift ?Chair/bed transfer assistive device: Armrests, Walker ?  ?Locomotion ?Ambulation ? ? ?Ambulation assist ? ?   ? ?Assist level: 2 helpers ?Assistive device: Walker-rolling ?Max distance: 4f  ? ?Walk 10 feet activity ? ? ?Assist ? Walk 10 feet activity did not occur: Safety/medical concerns (required use of AD) ? ?  ?   ? ?Walk 50 feet activity ? ? ?Assist Walk 50 feet with 2 turns activity did not occur: Safety/medical concerns (required use of AD) ? ?  ?   ? ? ?Walk 150 feet activity ? ? ?Assist Walk 150 feet activity did not occur: Safety/medical concerns ? ?  ?  ?  ? ?Walk 10 feet on uneven surface  ?activity ? ? ?Assist Walk 10 feet on  uneven surfaces activity did not occur: Safety/medical concerns ? ? ?  ?   ? ?Wheelchair ? ? ? ? ?Assist Is the patient using a wheelchair?: Yes (used for transport to/from gym) ?Type of Wheelchair: Manual ?  ? ?Wheelchair assist level: Dependent - Patient 0% ?   ? ? ?Wheelchair 50 feet with 2 turns activity ? ? ? ?Assist ? ?  ?  ? ? ?Assist Level: Dependent - Patient 0%  ? ?Wheelchair 150 feet activity  ? ? ? ?Assist ?   ? ? ?Assist Level: Dependent - Patient 0%  ? ?Blood pressure (!) 151/71, pulse 83, temperature 97.7 ?F (36.5 ?C), resp. rate 18, height 6' (1.829 m), weight 72.3 kg, SpO2 99 %. ? ?1. 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        D/C today  ?Trial methylphenidate  low dose  ?2.  Antithrombotics: ?-DVT/anticoagulation:  Pharmaceutical: Lovenox ?            -antiplatelet therapy: Aspirin 81 mg daily and Plavix 75 mg daily x3 weeks then Plavix alone ?Plavix alone as of 5/19 ?3. Pain Management: Tylenol as needed ?4. Mood: Melatonin 1.5 mg nightly provide emotional support ?            -antipsychotic agents: N/A ?5. Neuropsych: This patient is capable of making decisions on his own behalf. ?6. Skin/Wound Care: Routine skin checks ?7. Fluids/Electrolytes/Nutrition: Routine in and outs with follow-up chemistries ?BMET ok, per family poor appetite at home  ? ?  Latest Ref Rng & Units 06/08/2021  ?  6:22 AM 06/05/2021  ?  5:48 AM 06/03/2021  ?  1:40 PM  ?BMP  ?Glucose 70 - 99 mg/dL  188   169    ?BUN 8 - 23 mg/dL  24   21    ?Creatinine 0.61 - 1.24 mg/dL 1.01   0.77   0.85    ?Sodium 135 - 145 mmol/L  137   138    ?Potassium 3.5 - 5.1 mmol/L  3.9   4.1    ?Chloride 98 - 111 mmol/L  109   107    ?CO2 22 - 32 mmol/L  23   21    ?Calcium 8.9 - 10.3 mg/dL  9.5   10.0    ?  ?8.  Type 2 diabetes mellitus.  Hemoglobin A1c 7.7.  Jardiance 25 mg daily as well as SSI.  Actos 15 mg daily on hold resumed 5/5 ?CBG (last 3)  ? ?Recent Labs  ?  06/08/21 ?1654 06/08/21 ?2113 06/09/21 ?0640  ?GLUCAP 258* 276* 196*  ? ? 5/9 elevated , increase actos  ? 5/13 CBG elevated, IM following and restarted jardiance,  Megestrol dose decreased ? 5/14 CBGs up intake still poor  ?9.  Hypertension.  Well controlled, continue Cozaar 50 mg daily, Cardura 4 mg daily ?Vitals:  ? 06/08/21 2007 06/09/21 0542  ?BP: (!) 153/78 (!) 151/71  ?Pulse: 100 83  ?Resp: 18 18  ?Temp: 97.6 ?F (36.4 ?C) 97.7 ?F (36.5 ?C)  ?SpO2: 100% 99%  ?Cardura changed to '2mg'$  Q 12 due to lability   ?Increase Cozaar on 5/9 to '75mg'$  qam, monitor effect  ?-5/13 Cardura was held by IM team, now restarted at '4mg'$  at bedtime, appreciate assistance some lability  ?10.  Hyperlipidemia.  Continue Lipitor 40  mg daily, Zetia 10 mg daily ?11.  History of gout.   Continue Zyloprim 300 mg daily.  Monitor for any gout flareups ?12.  Hypothyroidism.  Continue Synthroid ? 13.  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  Freq small smears per RN, may need enema for retained stool , negative KUB ? -2 Large BM noted on 5/11, denies constipation, continue to follow ?15. Orthostatic hypotension ? Improved  ?16. Acute metabolic encephalopathy ? -Workup with Head CT without changes. B12, Urine culture, TSH unremarkable ? -Mentation appears improved after IV fluids ? -IM signed off appreciate assistance ?17. R neck pain, suspect related to positioning with preference for left side, discussed positioning, continue Voltaren gel, tylenol PRN ? -improved ? ?LOS: ?15 days ?A FACE TO FACE EVALUATION WAS PERFORMED ? ?Alan Rubio ?06/09/2021, 7:37 AM  ? ? ? ?

## 2021-06-09 NOTE — Progress Notes (Signed)
Physical Therapy Discharge Summary ? ?Patient Details  ?Name: Alan Rubio ?MRN: [id number] ?Date of Birth: 06-13-1931 ? ? ?Patient has met 0 of 9 long term goals due to overall decline in functional mobility since initial evaluation.  Patient to discharge at a wheelchair level requiring Max Assist for transfers using stedy and dependent wheelchair mobility. Patient's care partner  is unable  to provide the necessary physical and cognitive assistance at discharge; therefore, pt D/Cing to SNF.  ? ?Reasons goals not met: Pt with overall decline in functional mobility since initial evaluation requiring increased assistance for transfers and decline in ability to participate in gait training.  ? ?Recommendation:  ?Patient will benefit from ongoing skilled PT services in skilled nursing facility setting to continue to advance safe functional mobility, address ongoing impairments in activity tolerance, B LE strength, functional transfer training, gait training, and minimize fall risk. ? ?Equipment: ?No equipment provided - all necessary DME provided by next level of care ? ?Reasons for discharge: lack of progress toward goals and pt requiring longer duration of skilled therapy and nursing services ? ?Patient/family agrees with progress made and goals achieved: Yes ? ?PT Discharge ?Precautions/Restrictions ?Precautions ?Precautions: Fall;Other (comment) ?Precaution Comments: HOH, orthostatic wearing thigh high TED hose (monitor BP closely) ?Restrictions ?Weight Bearing Restrictions: No ?Pain ?  Unable to determine prior to D/C due to sooner than anticipated D/C to SNF with no additional scheduled therapy, please refer to nursing notes - recently pt had started reporting pain in R knee and foot during therapy sessions ?Pain Interference ?Pain Interference ?Pain Effect on Sleep: 8. Unable to answer (due to sooner than expected D/C to SNF and no additional scheduled therapies unable to ask pt these questions) ?Pain Interference  with Therapy Activities: 8. Unable to answer ?Pain Interference with Day-to-Day Activities: 8. Unable to answer ?Vision/Perception  ?Vision - History ?Ability to See in Adequate Light: 1 Impaired ?Perception ?Perception: Within Functional Limits ?Praxis ?Praxis: Impaired ?Praxis Impairment Details: Initiation;Motor planning ?Praxis-Other Comments: pt with poor initiation of movement benefiting from hand-over-hand faciltiation as well as impaired motor planning compounded by fear of falling  ?Cognition ?Overall Cognitive Status: Impaired/Different from baseline ?Arousal/Alertness: Lethargic ?Orientation Level: Oriented to person;Oriented to place ?Attention: Focused;Sustained ?Focused Attention: Impaired ?Sustained Attention: Impaired ?Memory: Impaired ?Awareness: Impaired ?Initiating: Impaired ?Safety/Judgment: Impaired ?Sensation ?Sensation ?Light Touch: Appears Intact ?Hot/Cold: Not tested ?Proprioception: Appears Intact ?Stereognosis: Not tested ?Coordination ?Gross Motor Movements are Fluid and Coordinated: No ?Coordination and Movement Description: impaired due to generalized weakness and deconditioning along with impaired balance with posterior lean bias ?Motor  ?Motor ?Motor: Other (comment);Abnormal postural alignment and control ?Motor - Discharge Observations: significant generalized weakness and deconditioning with impaired balance having posterior lean bias  ?Mobility ?Bed Mobility ?Bed Mobility: Sit to Supine;Supine to Sit;Rolling Left ?Rolling Right: Maximal Assistance - Patient 25-49% ?Supine to Sit: Maximal Assistance - Patient - Patient 25-49% ?Sit to Supine: Maximal Assistance - Patient 25-49% ?Transfers ?Transfers: Sit to Stand;Stand to [employer] ?Sit to Stand: Maximal Assistance - Patient 25-49%;Moderate Assistance - Patient 50-74% ?Stand to Sit: Moderate Assistance - Patient 50-74%;Maximal Assistance - Patient 25-49% ?Stand Pivot Transfers: Moderate  Assistance - Patient 50 - 74%;Maximal Assistance - Patient 25 - 49% ?Stand Pivot Transfer Details: Tactile cues for sequencing;Tactile cues for initiation;Tactile cues for weight shifting;Tactile cues for posture;Manual facilitation for weight shifting;Manual facilitation for placement;Verbal cues for safe use of DME/AE;Verbal cues for precautions/safety;Verbal cues for sequencing;Verbal cues for technique;Verbal cues for gait pattern;Visual cues for  safe use of DME/AE ?Transfer (Assistive device): Rolling walker (stand pivot using RW vs stedy transfers depending on fatigue) ?Transfer via Lift Equipment: Stedy ?Locomotion  ?Gait ?Ambulation: Yes ?Gait Assistance: 2 Helpers (max assist of 1 and +2 w/c follow) ?Gait Distance (Feet): 5 Feet ?Assistive device: Rolling walker ?Gait Assistance Details: Manual facilitation for placement;Manual facilitation for weight shifting;Verbal cues for gait pattern;Verbal cues for technique;Verbal cues for precautions/safety;Verbal cues for safe use of DME/AE;Tactile cues for weight beaing;Tactile cues for weight shifting;Tactile cues for posture;Tactile cues for sequencing;Tactile cues for initiation ?Gait ?Gait: Yes ?Gait Pattern: Impaired ?Gait Pattern: Poor foot clearance - left;Poor foot clearance - right;Narrow base of support;Trunk flexed;Decreased stride length;Decreased step length - right;Step-to pattern;Decreased step length - left (strong posterior lean with crouched posture) ?Gait velocity: decreased ?Stairs / Additional Locomotion ?Stairs: No ?Wheelchair Mobility ?Wheelchair Mobility: No  ?Trunk/Postural Assessment  ?Cervical Assessment ?Cervical Assessment: Exceptions to [facility] (forward and down head posture) ?Thoracic Assessment ?Thoracic Assessment: Exceptions to [facility] (rounded shoulders with mild kyphosis with L lateral trunk lean) ?Lumbar Assessment ?Lumbar Assessment: Exceptions to [facility] (significant posterior pelvic tilt in sitting) ?Postural Control ?Postural Control:  Deficits on evaluation ?Trunk Control: impaired with pt now presenting with L lateral trunk lean; requries varying from max assist to CGA to maintain static sitting balance  ?Balance ?Balance ?Balance Assessed: Yes ?Static Sitting Balance ?Static Sitting - Balance Support: No upper extremity supported;Feet supported ?Static Sitting - Level of Assistance: 2: Max assist;4: Min assist;3: Mod assist (varying levels with fatigue - pt with strong posterior lean bias) ?Dynamic Sitting Balance ?Dynamic Sitting - Balance Support: No upper extremity supported;During functional activity;Feet supported ?Dynamic Sitting - Level of Assistance: 2: Max assist ?Static Standing Balance ?Static Standing - Balance Support: During functional activity;Bilateral upper extremity supported ?Static Standing - Level of Assistance: 2: Max assist;3: Mod assist ?Dynamic Standing Balance ?Dynamic Standing - Balance Support: Bilateral upper extremity supported;During functional activity ?Dynamic Standing - Level of Assistance: 1: +2 Total assist ?Extremity Assessment  ?  ?  ?RLE Assessment ?RLE Assessment: Exceptions to [facility] ?General Strength Comments: unable to formally assess again prior to D/C due to sooner than anticipated D/C to SNF and no additional scheduled therapy - generally, no obvious signs of significant strength changes in R LE ?LLE Assessment ?LLE Assessment: Exceptions to [facility] ?General Strength Comments: unable to formally assess again prior to D/C due to sooner than anticipated D/C to SNF and no additional scheduled therapy - generally, no obvious signs of significant strength changes in LLE ? ? ? ?Tawana Scale , PT, DPT, NCS, CSRS ?06/09/2021, 7:58 AM ?

## 2021-06-09 NOTE — Progress Notes (Incomplete)
Patient refused Fleets enema x2, was able to get this patient to take his Miralax po and other schedule medications. Verbalizes rectal area hurt, provided.meds.. Able to consume approximally 200 cc liquids. ? ?2230 ?Verbalized rectal area is feeling better after receiving , medication, Anusol cream to area and repositioned ,   ? ? ? ?06/09/2021 ?0438 ?Per SW notation this patient is schedule for discharge today to [facility].Marland KitchenPTAR schedule for pick up at 11am , will remind patient ?

## 2021-06-10 NOTE — Progress Notes (Signed)
Speech Language Pathology Discharge Summary ? ?Patient Details  ?Name: Alan Rubio ?MRN: [id number] ?Date of Birth: January 18, 1932 ? ?Patient has met 2 of 5 long term goals.  Patient to discharge at overall mod-to-max A level.  ? ?Reasons goals not met: Pt with overall slow progress, frequent lethargy, and fluctuating level of assistance ? ?Clinical Impression/Discharge Summary: Pt has met 2 of 5 long term goals due to slow/limited progress since initial evaluation with fluctuating level of assistance. Pt often limited by fatigue. Patient is currently requiring mod-to-max A for completion of basic level cognitive tasks in regards to focused ad sustained attention, orientation, basic problem solving/reasoning, and functional recall; min A for speech intelligibility strategies at the sentence level. Patient is currently consuming a dysphagia 2 diet and thin liquids via straw at supervision level for implementation of safe swallowing precautions and strategies. Patient's care partner is unable to provide the necessary physical and cognitive assistance at discharge; therefore, pt discharging to SNF level of care. ? ?Recommendation:  ?Patient will benefit from ongoing skilled SLP services in skilled nursing facility setting to continue to maximize cognitive function and independence; maximize functional communication; maximize swallowing safety; reduce caregiver burden. ? ?Equipment: None recommended or provided by SLP ? ?Reasons for discharge: lack of progress toward goals and pt requiring longer duration of skilled therapy and nursing services ? ? ?Dorean Daniello T Mckenzye Cutright ?06/10/2021, 12:34 PM ? ?

## 2021-06-10 NOTE — Plan of Care (Signed)
?  Problem: RH Swallowing ?Goal: LTG Patient will consume least restrictive diet using compensatory strategies with assistance (SLP) ?Description: LTG:  Patient will consume least restrictive diet using compensatory strategies with assistance (SLP) ?06/10/2021 0853 by Charna Elizabeth T, CCC-SLP ?Outcome: Not Met (add Reason) ?Note: Pt at supervision level for implementation of safe swallowing precautions and to monitor alertness ?06/10/2021 0853 by Charna Elizabeth T, CCC-SLP ?Reactivated ? ?Problem: RH Swallowing ?Goal: LTG Pt will demonstrate functional change in swallow as evidenced by bedside/clinical objective assessment (SLP) ?Description: LTG: Patient will demonstrate functional change in swallow as evidenced by bedside/clinical objective assessment (SLP) ?06/10/2021 0853 by Charna Elizabeth T, CCC-SLP ?Outcome: Completed/Met ?06/10/2021 0852 by Charna Elizabeth T, CCC-SLP ?Outcome: Completed/Met ?Note: Improved mastication ?  ?Problem: RH Expression Communication ?Goal: LTG Patient will increase speech intelligibility (SLP) ?Description: LTG: Patient will increase speech intelligibility at word/phrase/conversation level with cues, % of the time (SLP) ?Outcome: Completed/Met ?  ?Problem: RH Cognition - SLP ?Goal: RH LTG Patient will demonstrate orientation with cues ?Description:  LTG:  Patient will demonstrate orientation to person/place/time/situation with cues (SLP)   ?06/10/2021 0853 by Charna Elizabeth T, CCC-SLP ?Outcome: Not Met (add Reason) ?Note: Performance and participation fluctuated with arousal ?06/10/2021 0852 by Charna Elizabeth T, CCC-SLP ?Outcome: Not Met (add Reason) ?Note: Performance fluctuated with arousal ?  ?Problem: RH Problem Solving ?Goal: LTG Patient will demonstrate problem solving for (SLP) ?Description: LTG:  Patient will demonstrate problem solving for basic/complex daily situations with cues  (SLP) ?Outcome: Not Met (add Reason) ?Note: Performance fluctuated with  arousal ?  ?Problem: RH Memory ?Goal: LTG Patient will use memory compensatory aids to (SLP) ?Description: LTG:  Patient will use memory compensatory aids to recall biographical/new, daily complex information with cues (SLP) ?Outcome: Not Met (add Reason) ?Note: Performance fluctuated with arousal ?  ?

## 2021-06-11 DIAGNOSIS — I69998 Other sequelae following unspecified cerebrovascular disease: Secondary | ICD-10-CM | POA: Diagnosis not present

## 2021-06-11 DIAGNOSIS — R1310 Dysphagia, unspecified: Secondary | ICD-10-CM | POA: Diagnosis not present

## 2021-06-11 DIAGNOSIS — R531 Weakness: Secondary | ICD-10-CM | POA: Diagnosis not present

## 2021-06-11 DIAGNOSIS — M542 Cervicalgia: Secondary | ICD-10-CM | POA: Diagnosis not present

## 2021-06-11 DIAGNOSIS — I4891 Unspecified atrial fibrillation: Secondary | ICD-10-CM | POA: Diagnosis not present

## 2021-06-11 DIAGNOSIS — G4720 Circadian rhythm sleep disorder, unspecified type: Secondary | ICD-10-CM | POA: Diagnosis not present

## 2021-06-11 DIAGNOSIS — K5900 Constipation, unspecified: Secondary | ICD-10-CM | POA: Diagnosis not present

## 2021-06-11 DIAGNOSIS — E039 Hypothyroidism, unspecified: Secondary | ICD-10-CM | POA: Diagnosis not present

## 2021-06-11 DIAGNOSIS — M6250 Muscle wasting and atrophy, not elsewhere classified, unspecified site: Secondary | ICD-10-CM | POA: Diagnosis not present

## 2021-06-11 DIAGNOSIS — Z8673 Personal history of transient ischemic attack (TIA), and cerebral infarction without residual deficits: Secondary | ICD-10-CM | POA: Diagnosis not present

## 2021-06-11 DIAGNOSIS — M109 Gout, unspecified: Secondary | ICD-10-CM | POA: Diagnosis not present

## 2021-06-11 DIAGNOSIS — I951 Orthostatic hypotension: Secondary | ICD-10-CM | POA: Diagnosis not present

## 2021-06-11 DIAGNOSIS — I69321 Dysphasia following cerebral infarction: Secondary | ICD-10-CM | POA: Diagnosis not present

## 2021-06-11 DIAGNOSIS — G9340 Encephalopathy, unspecified: Secondary | ICD-10-CM | POA: Diagnosis not present

## 2021-06-11 DIAGNOSIS — I152 Hypertension secondary to endocrine disorders: Secondary | ICD-10-CM | POA: Diagnosis not present

## 2021-06-11 DIAGNOSIS — E1159 Type 2 diabetes mellitus with other circulatory complications: Secondary | ICD-10-CM | POA: Diagnosis not present

## 2021-06-11 DIAGNOSIS — K5901 Slow transit constipation: Secondary | ICD-10-CM | POA: Diagnosis not present

## 2021-06-11 DIAGNOSIS — R2681 Unsteadiness on feet: Secondary | ICD-10-CM | POA: Diagnosis not present

## 2021-06-11 DIAGNOSIS — Z9189 Other specified personal risk factors, not elsewhere classified: Secondary | ICD-10-CM | POA: Diagnosis not present

## 2021-06-11 DIAGNOSIS — I639 Cerebral infarction, unspecified: Secondary | ICD-10-CM | POA: Diagnosis not present

## 2021-06-11 NOTE — Plan of Care (Signed)
  Problem: RH Bathing Goal: LTG Patient will bathe all body parts with assist levels (OT) Description: LTG: Patient will bathe all body parts with assist levels (OT) Outcome: Not Met (add Reason)   Problem: RH Dressing Goal: LTG Patient will perform upper body dressing (OT) Description: LTG Patient will perform upper body dressing with assist, with/without cues (OT). Outcome: Not Met (add Reason) Goal: LTG Patient will perform lower body dressing w/assist (OT) Description: LTG: Patient will perform lower body dressing with assist, with/without cues in positioning using equipment (OT) Outcome: Not Met (add Reason)   Problem: RH Toileting Goal: LTG Patient will perform toileting task (3/3 steps) with assistance level (OT) Description: LTG: Patient will perform toileting task (3/3 steps) with assistance level (OT)  Outcome: Not Met (add Reason)   Problem: RH Toilet Transfers Goal: LTG Patient will perform toilet transfers w/assist (OT) Description: LTG: Patient will perform toilet transfers with assist, with/without cues using equipment (OT) Outcome: Not Met (add Reason)   Problem: RH Tub/Shower Transfers Goal: LTG Patient will perform tub/shower transfers w/assist (OT) Description: LTG: Patient will perform tub/shower transfers with assist, with/without cues using equipment (OT) Outcome: Not Met (add Reason)

## 2021-06-11 NOTE — Progress Notes (Signed)
Occupational Therapy Discharge Summary  Patient Details  Name: Alan Rubio MRN: [id number] Date of Birth: [birth year]    Patient has met 0 of 6 long term goals   Patient to discharge at overall  mod to max A   level for functional mobility and for basic ADLs at bed level. Pt didn't meet goals in our program due to overall decline in functional mobility since initial evaluation.  Patient Max Assist for transfers using stedy . Patient's care partner  is unable  to provide the necessary physical and cognitive assistance at discharge; therefore, pt D/Cing to SNF.   Patient's care partner  unable   to provide the necessary physical and cognitive assistance at discharge.    Reasons goals not met: see above   Recommendation:  Patient will benefit from ongoing skilled OT services in skilled nursing facility setting to continue to advance functional skills in the area of BADL and Reduce care partner burden.  Equipment: No equipment provided  Reasons for discharge: change in medical status and discharge from hospital  Patient/family agrees with progress made and goals achieved: Yes  OT Discharge    ADL See care tool for ADL performance Vision  AFL Perception  Perception: Within Functional Limits Praxis Praxis: Impaired Praxis Impairment Details: Initiation;Motor planning Praxis-Other Comments: Pt with poor initiation of movement benefiting from hand- over- hand factilitation as well as impaired motor planning compounded by fear of falling Cognition Cognition Overall Cognitive Status: Impaired/Different from baseline Arousal/Alertness: Lethargic Orientation Level: Person;Place;Situation Attention: Focused;Sustained Focused Attention: Impaired Focused Attention Impairment: Functional basic Sustained Attention: Impaired Sustained Attention Impairment: Functional basic;Functional complex Awareness: Impaired Awareness Impairment: Emergent impairment;Intellectual impairment Problem  Solving: Impaired Problem Solving Impairment: Functional basic;Verbal basic Behaviors: Lability Safety/Judgment: Impaired Brief Interview for Mental Status (BIMS) Repetition of Three Words (First Attempt): 3 Temporal Orientation: Year: No answer Temporal Orientation: Month: No answer Temporal Orientation: Day: Incorrect Recall: "Sock": No, could not recall Recall: "Blue": No, could not recall Recall: "Bed": No, could not recall BIMS Summary Score: 3 Sensation Sensation Light Touch: Appears Intact Proprioception: Appears Intact Coordination Gross Motor Movements are Fluid and Coordinated: No Fine Motor Movements are Fluid and Coordinated: No Coordination and Movement Description: impaired due to generalized weakness and deconditioning along with impaired balance with posterior lean bias Motor  Motor Motor: Other (comment);Abnormal postural alignment and control Motor - Discharge Observations: significant generalized weakness and deconditioning with impaired balance having posterior lean bias Mobility  Bed Mobility Supine to Sit: Maximal Assistance - Patient - Patient 25-49% Sit to Supine: Maximal Assistance - Patient 25-49% Transfers Sit to Stand: Maximal Assistance - Patient 25-49%;Moderate Assistance - Patient 50-74% Stand to Sit: Moderate Assistance - Patient 50-74%;Maximal Assistance - Patient 25-49%  Trunk/Postural Assessment  Cervical Assessment Cervical Assessment: Exceptions to [facility] Thoracic Assessment Thoracic Assessment: Exceptions to [facility] Lumbar Assessment Lumbar Assessment: Exceptions to [facility] Postural Control Postural Control: Deficits on evaluation Trunk Control: impaired with pt now presenting with L lateral trunk lean; requries varying from max assist to CGA to maintain static sitting balance Righting Reactions: delayed and inadequate Postural Limitations: decreased with reliance on B UE support  Balance Balance Balance Assessed: Yes Static Sitting  Balance Static Sitting - Level of Assistance: 2: Max assist;4: Min assist;3: Mod assist Dynamic Sitting Balance Dynamic Sitting - Balance Support: No upper extremity supported;During functional activity;Feet supported Dynamic Sitting - Level of Assistance: 2: Max assist Static Standing Balance Static Standing - Level of Assistance: 2: Max assist;3: Mod assist Extremity/Trunk Assessment RUE Assessment  RUE Assessment: Within Functional Limits LUE Assessment LUE Assessment: Within Functional Limits   Willeen Cass [facility] 06/11/2021, 3:44 PM

## 2021-06-12 DIAGNOSIS — L309 Dermatitis, unspecified: Secondary | ICD-10-CM | POA: Diagnosis not present

## 2021-06-12 DIAGNOSIS — R7989 Other specified abnormal findings of blood chemistry: Secondary | ICD-10-CM | POA: Diagnosis not present

## 2021-06-15 DIAGNOSIS — I69398 Other sequelae of cerebral infarction: Secondary | ICD-10-CM | POA: Diagnosis not present

## 2021-06-15 DIAGNOSIS — K5900 Constipation, unspecified: Secondary | ICD-10-CM | POA: Diagnosis not present

## 2021-06-15 DIAGNOSIS — G9340 Encephalopathy, unspecified: Secondary | ICD-10-CM | POA: Diagnosis not present

## 2021-06-15 DIAGNOSIS — M6250 Muscle wasting and atrophy, not elsewhere classified, unspecified site: Secondary | ICD-10-CM | POA: Diagnosis not present

## 2021-06-15 DIAGNOSIS — I639 Cerebral infarction, unspecified: Secondary | ICD-10-CM | POA: Diagnosis not present

## 2021-06-15 DIAGNOSIS — Z8673 Personal history of transient ischemic attack (TIA), and cerebral infarction without residual deficits: Secondary | ICD-10-CM | POA: Diagnosis not present

## 2021-06-15 DIAGNOSIS — F32A Depression, unspecified: Secondary | ICD-10-CM | POA: Diagnosis not present

## 2021-06-15 DIAGNOSIS — E1159 Type 2 diabetes mellitus with other circulatory complications: Secondary | ICD-10-CM | POA: Diagnosis not present

## 2021-06-15 DIAGNOSIS — M542 Cervicalgia: Secondary | ICD-10-CM | POA: Diagnosis not present

## 2021-06-15 DIAGNOSIS — R2681 Unsteadiness on feet: Secondary | ICD-10-CM | POA: Diagnosis not present

## 2021-06-15 DIAGNOSIS — I152 Hypertension secondary to endocrine disorders: Secondary | ICD-10-CM | POA: Diagnosis not present

## 2021-06-15 DIAGNOSIS — I951 Orthostatic hypotension: Secondary | ICD-10-CM | POA: Diagnosis not present

## 2021-06-15 DIAGNOSIS — Z9189 Other specified personal risk factors, not elsewhere classified: Secondary | ICD-10-CM | POA: Diagnosis not present

## 2021-06-15 DIAGNOSIS — E871 Hypo-osmolality and hyponatremia: Secondary | ICD-10-CM | POA: Diagnosis not present

## 2021-06-15 DIAGNOSIS — I69321 Dysphasia following cerebral infarction: Secondary | ICD-10-CM | POA: Diagnosis not present

## 2021-06-16 DIAGNOSIS — D485 Neoplasm of uncertain behavior of skin: Secondary | ICD-10-CM | POA: Diagnosis not present

## 2021-06-17 ENCOUNTER — Ambulatory Visit: Payer: Medicare Other | Admitting: Podiatry

## 2021-06-17 DIAGNOSIS — G9340 Encephalopathy, unspecified: Secondary | ICD-10-CM | POA: Diagnosis not present

## 2021-06-17 DIAGNOSIS — I639 Cerebral infarction, unspecified: Secondary | ICD-10-CM | POA: Diagnosis not present

## 2021-06-17 DIAGNOSIS — M542 Cervicalgia: Secondary | ICD-10-CM | POA: Diagnosis not present

## 2021-06-17 DIAGNOSIS — I951 Orthostatic hypotension: Secondary | ICD-10-CM | POA: Diagnosis not present

## 2021-06-17 DIAGNOSIS — K5900 Constipation, unspecified: Secondary | ICD-10-CM | POA: Diagnosis not present

## 2021-06-17 DIAGNOSIS — M6250 Muscle wasting and atrophy, not elsewhere classified, unspecified site: Secondary | ICD-10-CM | POA: Diagnosis not present

## 2021-06-17 DIAGNOSIS — R2681 Unsteadiness on feet: Secondary | ICD-10-CM | POA: Diagnosis not present

## 2021-06-17 DIAGNOSIS — Z8673 Personal history of transient ischemic attack (TIA), and cerebral infarction without residual deficits: Secondary | ICD-10-CM | POA: Diagnosis not present

## 2021-06-18 ENCOUNTER — Other Ambulatory Visit: Payer: Self-pay | Admitting: *Deleted

## 2021-06-18 DIAGNOSIS — M6250 Muscle wasting and atrophy, not elsewhere classified, unspecified site: Secondary | ICD-10-CM | POA: Diagnosis not present

## 2021-06-18 DIAGNOSIS — Z8673 Personal history of transient ischemic attack (TIA), and cerebral infarction without residual deficits: Secondary | ICD-10-CM | POA: Diagnosis not present

## 2021-06-18 DIAGNOSIS — M542 Cervicalgia: Secondary | ICD-10-CM | POA: Diagnosis not present

## 2021-06-18 DIAGNOSIS — I951 Orthostatic hypotension: Secondary | ICD-10-CM | POA: Diagnosis not present

## 2021-06-18 DIAGNOSIS — I639 Cerebral infarction, unspecified: Secondary | ICD-10-CM | POA: Diagnosis not present

## 2021-06-18 DIAGNOSIS — R2681 Unsteadiness on feet: Secondary | ICD-10-CM | POA: Diagnosis not present

## 2021-06-18 DIAGNOSIS — G9340 Encephalopathy, unspecified: Secondary | ICD-10-CM | POA: Diagnosis not present

## 2021-06-18 DIAGNOSIS — K5900 Constipation, unspecified: Secondary | ICD-10-CM | POA: Diagnosis not present

## 2021-06-18 NOTE — Patient Outreach (Signed)
[facility] Post- Acute Care Coordinator follow up. Per [place] eligible member currently resides in [facility].  Screened for potential post SNF care coordination needs.   Member admitted to [facility] on 06/09/21 post hospitalization.  Member's PCP at [facility] has [facility]stream care management services available.   Update received from [place], SNF SW indicating transition plan is likely LTC.   Marthenia Rolling, MSN, RN,BSN [place] Acute Care Coordinator [phone] [facility]) [phone]  (Toll free office)

## 2021-06-23 DIAGNOSIS — K5900 Constipation, unspecified: Secondary | ICD-10-CM | POA: Diagnosis not present

## 2021-06-23 DIAGNOSIS — M542 Cervicalgia: Secondary | ICD-10-CM | POA: Diagnosis not present

## 2021-06-23 DIAGNOSIS — Z8673 Personal history of transient ischemic attack (TIA), and cerebral infarction without residual deficits: Secondary | ICD-10-CM | POA: Diagnosis not present

## 2021-06-23 DIAGNOSIS — G9340 Encephalopathy, unspecified: Secondary | ICD-10-CM | POA: Diagnosis not present

## 2021-06-23 DIAGNOSIS — I639 Cerebral infarction, unspecified: Secondary | ICD-10-CM | POA: Diagnosis not present

## 2021-06-23 DIAGNOSIS — I951 Orthostatic hypotension: Secondary | ICD-10-CM | POA: Diagnosis not present

## 2021-06-23 DIAGNOSIS — D485 Neoplasm of uncertain behavior of skin: Secondary | ICD-10-CM | POA: Diagnosis not present

## 2021-06-23 DIAGNOSIS — M6250 Muscle wasting and atrophy, not elsewhere classified, unspecified site: Secondary | ICD-10-CM | POA: Diagnosis not present

## 2021-06-23 DIAGNOSIS — L89612 Pressure ulcer of right heel, stage 2: Secondary | ICD-10-CM | POA: Diagnosis not present

## 2021-06-23 DIAGNOSIS — R2681 Unsteadiness on feet: Secondary | ICD-10-CM | POA: Diagnosis not present

## 2021-06-25 DIAGNOSIS — K5900 Constipation, unspecified: Secondary | ICD-10-CM | POA: Diagnosis not present

## 2021-06-25 DIAGNOSIS — Z8673 Personal history of transient ischemic attack (TIA), and cerebral infarction without residual deficits: Secondary | ICD-10-CM | POA: Diagnosis not present

## 2021-06-25 DIAGNOSIS — R2681 Unsteadiness on feet: Secondary | ICD-10-CM | POA: Diagnosis not present

## 2021-06-25 DIAGNOSIS — M542 Cervicalgia: Secondary | ICD-10-CM | POA: Diagnosis not present

## 2021-06-25 DIAGNOSIS — M6250 Muscle wasting and atrophy, not elsewhere classified, unspecified site: Secondary | ICD-10-CM | POA: Diagnosis not present

## 2021-06-25 DIAGNOSIS — I639 Cerebral infarction, unspecified: Secondary | ICD-10-CM | POA: Diagnosis not present

## 2021-06-25 DIAGNOSIS — I951 Orthostatic hypotension: Secondary | ICD-10-CM | POA: Diagnosis not present

## 2021-06-25 DIAGNOSIS — G9340 Encephalopathy, unspecified: Secondary | ICD-10-CM | POA: Diagnosis not present

## 2021-06-26 DIAGNOSIS — I951 Orthostatic hypotension: Secondary | ICD-10-CM | POA: Diagnosis not present

## 2021-06-26 DIAGNOSIS — I639 Cerebral infarction, unspecified: Secondary | ICD-10-CM | POA: Diagnosis not present

## 2021-06-26 DIAGNOSIS — G9340 Encephalopathy, unspecified: Secondary | ICD-10-CM | POA: Diagnosis not present

## 2021-06-26 DIAGNOSIS — K5900 Constipation, unspecified: Secondary | ICD-10-CM | POA: Diagnosis not present

## 2021-06-26 DIAGNOSIS — Z8673 Personal history of transient ischemic attack (TIA), and cerebral infarction without residual deficits: Secondary | ICD-10-CM | POA: Diagnosis not present

## 2021-06-26 DIAGNOSIS — M542 Cervicalgia: Secondary | ICD-10-CM | POA: Diagnosis not present

## 2021-06-26 DIAGNOSIS — M6250 Muscle wasting and atrophy, not elsewhere classified, unspecified site: Secondary | ICD-10-CM | POA: Diagnosis not present

## 2021-06-26 DIAGNOSIS — R2681 Unsteadiness on feet: Secondary | ICD-10-CM | POA: Diagnosis not present

## 2021-06-29 DIAGNOSIS — I69398 Other sequelae of cerebral infarction: Secondary | ICD-10-CM | POA: Diagnosis not present

## 2021-06-29 DIAGNOSIS — I152 Hypertension secondary to endocrine disorders: Secondary | ICD-10-CM | POA: Diagnosis not present

## 2021-06-29 DIAGNOSIS — R040 Epistaxis: Secondary | ICD-10-CM | POA: Diagnosis not present

## 2021-06-29 DIAGNOSIS — F32A Depression, unspecified: Secondary | ICD-10-CM | POA: Diagnosis not present

## 2021-06-29 DIAGNOSIS — I69321 Dysphasia following cerebral infarction: Secondary | ICD-10-CM | POA: Diagnosis not present

## 2021-06-30 DIAGNOSIS — R040 Epistaxis: Secondary | ICD-10-CM | POA: Diagnosis not present

## 2021-06-30 DIAGNOSIS — I639 Cerebral infarction, unspecified: Secondary | ICD-10-CM | POA: Diagnosis not present

## 2021-06-30 DIAGNOSIS — D485 Neoplasm of uncertain behavior of skin: Secondary | ICD-10-CM | POA: Diagnosis not present

## 2021-06-30 DIAGNOSIS — L89612 Pressure ulcer of right heel, stage 2: Secondary | ICD-10-CM | POA: Diagnosis not present

## 2021-06-30 DIAGNOSIS — M542 Cervicalgia: Secondary | ICD-10-CM | POA: Diagnosis not present

## 2021-06-30 DIAGNOSIS — R2681 Unsteadiness on feet: Secondary | ICD-10-CM | POA: Diagnosis not present

## 2021-06-30 DIAGNOSIS — Z8673 Personal history of transient ischemic attack (TIA), and cerebral infarction without residual deficits: Secondary | ICD-10-CM | POA: Diagnosis not present

## 2021-06-30 DIAGNOSIS — M6250 Muscle wasting and atrophy, not elsewhere classified, unspecified site: Secondary | ICD-10-CM | POA: Diagnosis not present

## 2021-06-30 DIAGNOSIS — K5900 Constipation, unspecified: Secondary | ICD-10-CM | POA: Diagnosis not present

## 2021-06-30 DIAGNOSIS — G9340 Encephalopathy, unspecified: Secondary | ICD-10-CM | POA: Diagnosis not present

## 2021-06-30 DIAGNOSIS — I951 Orthostatic hypotension: Secondary | ICD-10-CM | POA: Diagnosis not present

## 2021-07-04 DIAGNOSIS — R040 Epistaxis: Secondary | ICD-10-CM | POA: Diagnosis not present

## 2021-07-04 DIAGNOSIS — Z8673 Personal history of transient ischemic attack (TIA), and cerebral infarction without residual deficits: Secondary | ICD-10-CM | POA: Diagnosis not present

## 2021-07-04 DIAGNOSIS — R2681 Unsteadiness on feet: Secondary | ICD-10-CM | POA: Diagnosis not present

## 2021-07-04 DIAGNOSIS — K5900 Constipation, unspecified: Secondary | ICD-10-CM | POA: Diagnosis not present

## 2021-07-04 DIAGNOSIS — G9340 Encephalopathy, unspecified: Secondary | ICD-10-CM | POA: Diagnosis not present

## 2021-07-04 DIAGNOSIS — I951 Orthostatic hypotension: Secondary | ICD-10-CM | POA: Diagnosis not present

## 2021-07-04 DIAGNOSIS — M6250 Muscle wasting and atrophy, not elsewhere classified, unspecified site: Secondary | ICD-10-CM | POA: Diagnosis not present

## 2021-07-04 DIAGNOSIS — M542 Cervicalgia: Secondary | ICD-10-CM | POA: Diagnosis not present

## 2021-07-04 DIAGNOSIS — I639 Cerebral infarction, unspecified: Secondary | ICD-10-CM | POA: Diagnosis not present

## 2021-07-06 DIAGNOSIS — K5900 Constipation, unspecified: Secondary | ICD-10-CM | POA: Diagnosis not present

## 2021-07-06 DIAGNOSIS — R2681 Unsteadiness on feet: Secondary | ICD-10-CM | POA: Diagnosis not present

## 2021-07-06 DIAGNOSIS — I639 Cerebral infarction, unspecified: Secondary | ICD-10-CM | POA: Diagnosis not present

## 2021-07-06 DIAGNOSIS — M542 Cervicalgia: Secondary | ICD-10-CM | POA: Diagnosis not present

## 2021-07-06 DIAGNOSIS — G9340 Encephalopathy, unspecified: Secondary | ICD-10-CM | POA: Diagnosis not present

## 2021-07-06 DIAGNOSIS — E46 Unspecified protein-calorie malnutrition: Secondary | ICD-10-CM | POA: Diagnosis not present

## 2021-07-06 DIAGNOSIS — R040 Epistaxis: Secondary | ICD-10-CM | POA: Diagnosis not present

## 2021-07-06 DIAGNOSIS — I951 Orthostatic hypotension: Secondary | ICD-10-CM | POA: Diagnosis not present

## 2021-07-06 DIAGNOSIS — Z8673 Personal history of transient ischemic attack (TIA), and cerebral infarction without residual deficits: Secondary | ICD-10-CM | POA: Diagnosis not present

## 2021-07-06 DIAGNOSIS — M6250 Muscle wasting and atrophy, not elsewhere classified, unspecified site: Secondary | ICD-10-CM | POA: Diagnosis not present

## 2021-07-07 DIAGNOSIS — R2681 Unsteadiness on feet: Secondary | ICD-10-CM | POA: Diagnosis not present

## 2021-07-07 DIAGNOSIS — I639 Cerebral infarction, unspecified: Secondary | ICD-10-CM | POA: Diagnosis not present

## 2021-07-07 DIAGNOSIS — R040 Epistaxis: Secondary | ICD-10-CM | POA: Diagnosis not present

## 2021-07-07 DIAGNOSIS — L89612 Pressure ulcer of right heel, stage 2: Secondary | ICD-10-CM | POA: Diagnosis not present

## 2021-07-07 DIAGNOSIS — K5900 Constipation, unspecified: Secondary | ICD-10-CM | POA: Diagnosis not present

## 2021-07-07 DIAGNOSIS — M542 Cervicalgia: Secondary | ICD-10-CM | POA: Diagnosis not present

## 2021-07-07 DIAGNOSIS — I951 Orthostatic hypotension: Secondary | ICD-10-CM | POA: Diagnosis not present

## 2021-07-07 DIAGNOSIS — E46 Unspecified protein-calorie malnutrition: Secondary | ICD-10-CM | POA: Diagnosis not present

## 2021-07-07 DIAGNOSIS — M6250 Muscle wasting and atrophy, not elsewhere classified, unspecified site: Secondary | ICD-10-CM | POA: Diagnosis not present

## 2021-07-07 DIAGNOSIS — Z8673 Personal history of transient ischemic attack (TIA), and cerebral infarction without residual deficits: Secondary | ICD-10-CM | POA: Diagnosis not present

## 2021-07-07 DIAGNOSIS — G9340 Encephalopathy, unspecified: Secondary | ICD-10-CM | POA: Diagnosis not present

## 2021-07-07 DIAGNOSIS — D485 Neoplasm of uncertain behavior of skin: Secondary | ICD-10-CM | POA: Diagnosis not present

## 2021-07-10 DIAGNOSIS — I951 Orthostatic hypotension: Secondary | ICD-10-CM | POA: Diagnosis not present

## 2021-07-10 DIAGNOSIS — M6250 Muscle wasting and atrophy, not elsewhere classified, unspecified site: Secondary | ICD-10-CM | POA: Diagnosis not present

## 2021-07-10 DIAGNOSIS — R040 Epistaxis: Secondary | ICD-10-CM | POA: Diagnosis not present

## 2021-07-10 DIAGNOSIS — E46 Unspecified protein-calorie malnutrition: Secondary | ICD-10-CM | POA: Diagnosis not present

## 2021-07-10 DIAGNOSIS — K5900 Constipation, unspecified: Secondary | ICD-10-CM | POA: Diagnosis not present

## 2021-07-10 DIAGNOSIS — G9340 Encephalopathy, unspecified: Secondary | ICD-10-CM | POA: Diagnosis not present

## 2021-07-10 DIAGNOSIS — I639 Cerebral infarction, unspecified: Secondary | ICD-10-CM | POA: Diagnosis not present

## 2021-07-10 DIAGNOSIS — M542 Cervicalgia: Secondary | ICD-10-CM | POA: Diagnosis not present

## 2021-07-10 DIAGNOSIS — R2681 Unsteadiness on feet: Secondary | ICD-10-CM | POA: Diagnosis not present

## 2021-07-10 DIAGNOSIS — Z8673 Personal history of transient ischemic attack (TIA), and cerebral infarction without residual deficits: Secondary | ICD-10-CM | POA: Diagnosis not present

## 2021-07-14 DIAGNOSIS — D485 Neoplasm of uncertain behavior of skin: Secondary | ICD-10-CM | POA: Diagnosis not present

## 2021-07-14 DIAGNOSIS — L89612 Pressure ulcer of right heel, stage 2: Secondary | ICD-10-CM | POA: Diagnosis not present

## 2021-07-14 NOTE — Progress Notes (Unsigned)
[facility] [address] [place]. [place] [address] [phone]       HOSPITAL FOLLOW UP NOTE  [name] Date of Birth:  08/21/1931 Medical Record Number:  [id number]   Reason for Referral:  hospital stroke follow up    SUBJECTIVE:   CHIEF COMPLAINT:  No chief complaint on file.   HPI:   [name] is a 88 y.o. male with history of HTN, HLD and stroke in 2020 who presented on 05/14/2021 with slurred speech and lower extremity weakness.  Personally reviewed hospitalization pertinent progress notes, lab work and imaging.  Evaluated by Dr. Erlinda Hong.  Stroke work-up revealed left pontine infarct likely secondary to small vessel disease.  Interrogation of his loop recorder reveals no atrial fibrillation.  His family states that he has had multiple transient episodes of weakness which typically resolved before he could seek medical attention.  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  Carotid Doppler unremarkable.  EF 60 to 65%.  LDL 106.  A1c 7.7.  On aspirin PTA, recommended DAPT for 3 weeks and Plavix alone as well as initiating atorvastatin 40 mg daily.  History of prior stroke 03/2018 with multifocal bilateral infarcts secondary to unknown source, was seen at [facility] 06/06/2018, s/p ILR placement 06/16/2018, did not follow up in office after that time.  Per therapy recommendations, discharged to CIR on 5/1 for ongoing therapy needs.        PERTINENT IMAGING  Per hospitalization 05/22/2021 Code Stroke CT head No acute abnormality. Small vessel disease ASPECTS 10.    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 06/16/2018   Remote loop recorder check  9.15.20: 1 false AF=SB/1st deg AVB/PACs   Stroke (HCC)    Thyroid disease     PSH:  Past Surgical History:  Procedure Laterality Date   BACK SURGERY     LOOP RECORDER IMPLANT      Social History:  Social History   Socioeconomic History   Marital status: Married    Spouse name: Not on file   Number of children: 1   Years of education: Not on file   Highest education level: Not on file  Occupational History   Not on file  Tobacco Use   Smoking status: Never   Smokeless tobacco: Never  Vaping Use   Vaping Use: Never used  Substance and Sexual Activity   Alcohol use: Yes    Comment: Once in a while   Drug use: Never   Sexual activity: Not on file  Other Topics Concern   Not on file  Social History Narrative   Not on file   Social Determinants of Health   Financial Resource Strain: Not on file  Food Insecurity: Not on file  Transportation Needs: Not on file  Physical Activity: Not on file  Stress: Not on file  Social Connections: Not  on file  Intimate Partner Violence: Not on file    Family History:  Family History  Problem Relation Age of Onset   Stroke Mother    Aneurysm Father    Cancer Brother     Medications:   Current Outpatient Medications on File Prior to Visit  Medication Sig Dispense Refill   allopurinol (ZYLOPRIM) 300 MG tablet Take 300 mg by mouth daily.     aspirin EC 81 MG tablet Take 81 mg by mouth daily.     atorvastatin (LIPITOR) 40 MG tablet Take 1 tablet (40 mg total) by mouth daily.     clopidogrel (PLAVIX) 75 MG tablet Take 1 tablet (75 mg total) by mouth daily.     diclofenac Sodium (VOLTAREN) 1 % GEL Apply 2 g topically 4 (four) times daily.     doxazosin (CARDURA) 4 MG tablet  Take 1 tablet (4 mg total) by mouth at bedtime.     empagliflozin (JARDIANCE) 10 MG TABS tablet Take 1 tablet (10 mg total) by mouth daily. 30 tablet    ezetimibe (ZETIA) 10 MG tablet Take 10 mg by mouth daily.     hydrocortisone (ANUSOL-HC) 2.5 % rectal cream Place rectally 3 (three) times daily. 30 g 0   levothyroxine (SYNTHROID) 88 MCG tablet Take 88 mcg by mouth daily before breakfast.     losartan (COZAAR) 25 MG tablet Take 3 tablets (75 mg total) by mouth daily.     megestrol (MEGACE) 400 MG/10ML suspension Take 5 mLs (200 mg total) by mouth 2 (two) times daily. 240 mL 0   melatonin 300 MCG TABS Take 5 tablets (1,500 mcg total) by mouth at bedtime.  0   methylphenidate (RITALIN) 5 MG tablet Take 1 tablet (5 mg total) by mouth 2 (two) times daily with breakfast and lunch. 10 tablet 0   Multiple Vitamin (MULTIVITAMIN WITH MINERALS) TABS tablet Take 1 tablet by mouth daily.     nystatin (MYCOSTATIN/NYSTOP) powder Apply topically 2 (two) times daily. 15 g 0   pioglitazone (ACTOS) 30 MG tablet Take 1 tablet (30 mg total) by mouth daily.     polyethylene glycol (MIRALAX / GLYCOLAX) 17 g packet Take 17 g by mouth 2 (two) times daily. 14 each 0   sertraline (ZOLOFT) 25 MG tablet Take 1 tablet (25 mg total) by mouth daily.     No current facility-administered medications on file prior to visit.    Allergies:   Allergies  Allergen Reactions   Penicillins Rash    Did it involve swelling of the face/tongue/throat, SOB, or low BP? Yes Did it involve sudden or severe rash/hives, skin peeling, or any reaction on the inside of your mouth or nose? No Did you need to seek medical attention at a hospital or doctor's office? No When did it last happen?       If all above answers are "NO", may proceed with cephalosporin use.         OBJECTIVE:  Physical Exam  There were no vitals filed for this visit. There is no height or weight on file to calculate BMI. No results found.      No data to  display           General: well developed, well nourished, seated, in no evident distress Head: head normocephalic and atraumatic.   Neck: supple with no carotid or supraclavicular bruits Cardiovascular: regular rate and rhythm, no murmurs Musculoskeletal: no deformity Skin:  no rash/petichiae Vascular:  Normal pulses all extremities  Neurologic Exam Mental Status: Awake and fully alert. Oriented to place and time. Recent and remote memory intact. Attention span, concentration and fund of knowledge appropriate. Mood and affect appropriate.  Cranial Nerves: Fundoscopic exam reveals sharp disc margins. Pupils equal, briskly reactive to light. Extraocular movements full without nystagmus. Visual fields full to confrontation. Hearing intact. Facial sensation intact. Face, tongue, palate moves normally and symmetrically.  Motor: Normal bulk and tone. Normal strength in all tested extremity muscles Sensory.: intact to touch , pinprick , position and vibratory sensation.  Coordination: Rapid alternating movements normal in all extremities. Finger-to-nose and heel-to-shin performed accurately bilaterally. Gait and Station: Arises from chair without difficulty. Stance is normal. Gait demonstrates normal stride length and balance with ***. Tandem walk and heel toe ***.  Reflexes: 1+ and symmetric. Toes downgoing.     NIHSS  *** Modified Rankin  ***      ASSESSMENT: Alan Rubio is a 88 y.o. year old male with left pontine infarct on 05/22/2021 likely secondary to small vessel disease. Vascular risk factors include prior multifocal bilateral stroke 03/2018 secondary to unknown source s/p ILR 05/2018, HTN, HLD, DM, intracranial stenosis and advanced age.      PLAN:  Left pontine stroke:  Hx of cryptogenic stroke  residual deficit: ***.  Continue clopidogrel 75 mg daily  and atorvastatin 40 mg daily for secondary stroke prevention.   Loop recorder has not shown atrial fibrillation thus far -  continued to be monitored by cardiology Stroke labs 04/2021: LDL 84, A1c 7.9 Discussed secondary stroke prevention measures and importance of close PCP follow up for aggressive stroke risk factor management including BP goal<130/90, HLD with LDL goal<70 and DM with A1c.<7 .  I have gone over the pathophysiology of stroke, warning signs and symptoms, risk factors and their management in some detail with instructions to go to the closest emergency room for symptoms of concern.     Follow up in *** or call earlier if needed   CC:  [facility] provider: Dr. Leonie Man PCP: [name], MD    I spent *** minutes of face-to-face and non-face-to-face time with patient.  This included previsit chart review including review of recent hospitalization, lab review, study review, order entry, electronic health record documentation, patient education regarding recent stroke including etiology, secondary stroke prevention measures and importance of managing stroke risk factors, residual deficits and typical recovery time and answered all other questions to patient satisfaction   Frann Rider, AGNP-BC  [facility] [address] [place], [place] [address]  Phone [phone] Fax [phone] Note: This document was prepared with digital dictation and possible smart phrase technology. Any transcriptional errors that result from this process are unintentional.

## 2021-07-15 ENCOUNTER — Ambulatory Visit (INDEPENDENT_AMBULATORY_CARE_PROVIDER_SITE_OTHER): Payer: Medicare Other | Admitting: Adult Health

## 2021-07-15 ENCOUNTER — Encounter: Payer: Self-pay | Admitting: Adult Health

## 2021-07-15 VITALS — BP 91/58 | HR 97 | Ht 72.0 in | Wt 163.4 lb

## 2021-07-15 DIAGNOSIS — Z09 Encounter for follow-up examination after completed treatment for conditions other than malignant neoplasm: Secondary | ICD-10-CM

## 2021-07-15 DIAGNOSIS — R531 Weakness: Secondary | ICD-10-CM

## 2021-07-15 DIAGNOSIS — I639 Cerebral infarction, unspecified: Secondary | ICD-10-CM | POA: Diagnosis not present

## 2021-07-15 DIAGNOSIS — Z8673 Personal history of transient ischemic attack (TIA), and cerebral infarction without residual deficits: Secondary | ICD-10-CM

## 2021-07-15 NOTE — Patient Instructions (Addendum)
Please monitor food intake and ensure adequate protein which will further help with recover  Continue working with therapies at SNF for hopefully ongoing recovery  Continue clopidogrel 75 mg daily  and atorvastatin  for secondary stroke prevention Remain off aspirin - no indication for prolonged DAPT (aspirin and plavix together)  Continue to follow up with PCP regarding cholesterol, blood pressure and diabetes management  Maintain strict control of hypertension with blood pressure goal below 130/90 with avoidance of hypotension, diabetes with hemoglobin A1c goal below 7.0 % and cholesterol with LDL cholesterol (bad cholesterol) goal below 70 mg/dL.   Signs of a Stroke? Follow the BEFAST method:  Balance Watch for a sudden loss of balance, trouble with coordination or vertigo Eyes Is there a sudden loss of vision in one or both eyes? Or double vision?  Face: Ask the person to smile. Does one side of the face droop or is it numb?  Arms: Ask the person to raise both arms. Does one arm drift downward? Is there weakness or numbness of a leg? Speech: Ask the person to repeat a simple phrase. Does the speech sound slurred/strange? Is the person confused ? Time: If you observe any of these signs, call 911.      Followup in the future with me in 4 months or call earlier if needed       Thank you for coming to see [place] at [facility]. I hope we have been able to provide you high quality care today.  You may receive a patient satisfaction survey over the next few weeks. We would appreciate your feedback and comments so that we may continue to improve ourselves and the health of our patients.

## 2021-07-20 DIAGNOSIS — K5900 Constipation, unspecified: Secondary | ICD-10-CM | POA: Diagnosis not present

## 2021-07-20 DIAGNOSIS — M6250 Muscle wasting and atrophy, not elsewhere classified, unspecified site: Secondary | ICD-10-CM | POA: Diagnosis not present

## 2021-07-20 DIAGNOSIS — I951 Orthostatic hypotension: Secondary | ICD-10-CM | POA: Diagnosis not present

## 2021-07-20 DIAGNOSIS — I639 Cerebral infarction, unspecified: Secondary | ICD-10-CM | POA: Diagnosis not present

## 2021-07-20 DIAGNOSIS — E46 Unspecified protein-calorie malnutrition: Secondary | ICD-10-CM | POA: Diagnosis not present

## 2021-07-20 DIAGNOSIS — R2681 Unsteadiness on feet: Secondary | ICD-10-CM | POA: Diagnosis not present

## 2021-07-20 DIAGNOSIS — Z8673 Personal history of transient ischemic attack (TIA), and cerebral infarction without residual deficits: Secondary | ICD-10-CM | POA: Diagnosis not present

## 2021-07-20 DIAGNOSIS — G9340 Encephalopathy, unspecified: Secondary | ICD-10-CM | POA: Diagnosis not present

## 2021-07-20 DIAGNOSIS — R040 Epistaxis: Secondary | ICD-10-CM | POA: Diagnosis not present

## 2021-07-20 DIAGNOSIS — M542 Cervicalgia: Secondary | ICD-10-CM | POA: Diagnosis not present

## 2021-07-21 DIAGNOSIS — I152 Hypertension secondary to endocrine disorders: Secondary | ICD-10-CM | POA: Diagnosis not present

## 2021-07-21 DIAGNOSIS — D485 Neoplasm of uncertain behavior of skin: Secondary | ICD-10-CM | POA: Diagnosis not present

## 2021-07-21 DIAGNOSIS — E871 Hypo-osmolality and hyponatremia: Secondary | ICD-10-CM | POA: Diagnosis not present

## 2021-07-21 DIAGNOSIS — E1159 Type 2 diabetes mellitus with other circulatory complications: Secondary | ICD-10-CM | POA: Diagnosis not present

## 2021-07-21 DIAGNOSIS — I69321 Dysphasia following cerebral infarction: Secondary | ICD-10-CM | POA: Diagnosis not present

## 2021-07-21 DIAGNOSIS — L89612 Pressure ulcer of right heel, stage 2: Secondary | ICD-10-CM | POA: Diagnosis not present

## 2021-07-21 DIAGNOSIS — E039 Hypothyroidism, unspecified: Secondary | ICD-10-CM | POA: Diagnosis not present

## 2021-07-22 DIAGNOSIS — R112 Nausea with vomiting, unspecified: Secondary | ICD-10-CM | POA: Diagnosis not present

## 2021-07-22 DIAGNOSIS — M6250 Muscle wasting and atrophy, not elsewhere classified, unspecified site: Secondary | ICD-10-CM | POA: Diagnosis not present

## 2021-07-22 DIAGNOSIS — I951 Orthostatic hypotension: Secondary | ICD-10-CM | POA: Diagnosis not present

## 2021-07-22 DIAGNOSIS — R2681 Unsteadiness on feet: Secondary | ICD-10-CM | POA: Diagnosis not present

## 2021-07-22 DIAGNOSIS — K920 Hematemesis: Secondary | ICD-10-CM | POA: Diagnosis not present

## 2021-07-22 DIAGNOSIS — Z8673 Personal history of transient ischemic attack (TIA), and cerebral infarction without residual deficits: Secondary | ICD-10-CM | POA: Diagnosis not present

## 2021-07-22 DIAGNOSIS — R040 Epistaxis: Secondary | ICD-10-CM | POA: Diagnosis not present

## 2021-07-22 DIAGNOSIS — Z7902 Long term (current) use of antithrombotics/antiplatelets: Secondary | ICD-10-CM | POA: Diagnosis not present

## 2021-07-22 DIAGNOSIS — G9340 Encephalopathy, unspecified: Secondary | ICD-10-CM | POA: Diagnosis not present

## 2021-07-22 DIAGNOSIS — E46 Unspecified protein-calorie malnutrition: Secondary | ICD-10-CM | POA: Diagnosis not present

## 2021-07-22 DIAGNOSIS — K5900 Constipation, unspecified: Secondary | ICD-10-CM | POA: Diagnosis not present

## 2021-07-22 DIAGNOSIS — M542 Cervicalgia: Secondary | ICD-10-CM | POA: Diagnosis not present

## 2021-07-22 DIAGNOSIS — I639 Cerebral infarction, unspecified: Secondary | ICD-10-CM | POA: Diagnosis not present

## 2021-07-22 DIAGNOSIS — E871 Hypo-osmolality and hyponatremia: Secondary | ICD-10-CM | POA: Diagnosis not present

## 2021-07-23 ENCOUNTER — Encounter: Payer: Self-pay | Admitting: Physical Medicine & Rehabilitation

## 2021-07-23 ENCOUNTER — Encounter: Payer: Medicare Other | Attending: Physical Medicine & Rehabilitation | Admitting: Physical Medicine & Rehabilitation

## 2021-07-23 VITALS — BP 94/60 | HR 90 | Ht 72.0 in

## 2021-07-23 DIAGNOSIS — R269 Unspecified abnormalities of gait and mobility: Secondary | ICD-10-CM | POA: Insufficient documentation

## 2021-07-23 DIAGNOSIS — K920 Hematemesis: Secondary | ICD-10-CM | POA: Diagnosis not present

## 2021-07-23 DIAGNOSIS — Z8673 Personal history of transient ischemic attack (TIA), and cerebral infarction without residual deficits: Secondary | ICD-10-CM | POA: Diagnosis not present

## 2021-07-23 DIAGNOSIS — K5900 Constipation, unspecified: Secondary | ICD-10-CM | POA: Diagnosis not present

## 2021-07-23 DIAGNOSIS — I951 Orthostatic hypotension: Secondary | ICD-10-CM | POA: Diagnosis not present

## 2021-07-23 DIAGNOSIS — I639 Cerebral infarction, unspecified: Secondary | ICD-10-CM | POA: Diagnosis not present

## 2021-07-23 DIAGNOSIS — I69398 Other sequelae of cerebral infarction: Secondary | ICD-10-CM | POA: Diagnosis not present

## 2021-07-23 DIAGNOSIS — M542 Cervicalgia: Secondary | ICD-10-CM | POA: Diagnosis not present

## 2021-07-23 DIAGNOSIS — R2681 Unsteadiness on feet: Secondary | ICD-10-CM | POA: Diagnosis not present

## 2021-07-23 DIAGNOSIS — G9340 Encephalopathy, unspecified: Secondary | ICD-10-CM | POA: Diagnosis not present

## 2021-07-23 DIAGNOSIS — E46 Unspecified protein-calorie malnutrition: Secondary | ICD-10-CM | POA: Diagnosis not present

## 2021-07-23 DIAGNOSIS — M6250 Muscle wasting and atrophy, not elsewhere classified, unspecified site: Secondary | ICD-10-CM | POA: Diagnosis not present

## 2021-07-23 DIAGNOSIS — R040 Epistaxis: Secondary | ICD-10-CM | POA: Diagnosis not present

## 2021-07-23 NOTE — Progress Notes (Signed)
Subjective:    Patient ID: Alan Rubio, male    DOB: 11-09-1931, 88 y.o.   MRN: [id number] 88 y.o. right-handed male with history of hypertension hyperlipidemia atrial fibrillation, CVA 2020 status post loop recorder maintained on low-dose aspirin, hypothyroidism, type 2 diabetes mellitus.  Per chart review lives with spouse.  Modified independent with the use of occasional cane.  Presented 05/22/2021 with acute onset of slurred speech bilateral lower extremity weakness.  EMS arrived blood pressure 183/77.  Cranial CT scan showed no acute intracranial abnormality.  Sequela of prior hemorrhage in the right frontal lobe and chronic microvascular ischemic disease.  Patient did not receive tPA.  Chest x-ray negative.  MRI of the brain showed acute perforator infarct of the parasagittal left pons.  Chronic infarcts and chronic microvascular ischemic changes.  MRA showed no proximal intracranial vessel occlusion.  Carotid Dopplers with no ICA stenosis.  Admission chemistries unremarkable except glucose 151 hemoglobin A1c 7.7.  Echocardiogram with ejection fraction of 60 to 65% no wall motion abnormalities grade 1 diastolic dysfunction.  Neurology follow-up maintained on aspirin 81 mg daily and Plavix 75 mg daily for CVA prophylaxis x3 weeks then Plavix alone.   Admit date: 05/25/2021 Discharge date: 06/09/21    HPI Patient has been at the [facility] facility since discharge from inpatient rehab at [facility].  Has been receiving PT and OT.  Discouraged about his progress.  His swallowing is doing okay he is on thin liquids and a soft diet.  He has had some limitations in terms of ambulation due to some skin areas on his feet that have required wound care. No new falls reported. Upper extremity strength is doing okay. Pain Inventory Average Pain 0 Pain Right Now 0 My pain is  no pain  LOCATION OF PAIN  no pain  BOWEL Number of stools per week:  unknown   BLADDER Normal    Mobility use a wheelchair needs help with transfers  Function retired  Neuro/Psych No problems in this area  Prior Studies Any changes since last visit?  no  Physicians involved in your care Any changes since last visit?  no   Family History  Problem Relation Age of Onset   Stroke Mother    Aneurysm Father    Cancer Brother    Social History   Socioeconomic History   Marital status: Married    Spouse name: Not on file   Number of children: 1   Years of education: Not on file   Highest education level: Not on file  Occupational History   Not on file  Tobacco Use   Smoking status: Never   Smokeless tobacco: Never  Vaping Use   Vaping Use: Never used  Substance and Sexual Activity   Alcohol use: Yes    Comment: Once in a while   Drug use: Never   Sexual activity: Not on file  Other Topics Concern   Not on file  Social History Narrative   Not on file   Social Determinants of Health   Financial Resource Strain: Not on file  Food Insecurity: Not on file  Transportation Needs: Not on file  Physical Activity: Not on file  Stress: Not on file  Social Connections: Not on file   Past Surgical History:  Procedure Laterality Date   BACK SURGERY     LOOP RECORDER IMPLANT     Past Medical History:  Diagnosis Date   A-fib ([place])    Cancer ([place])  Diabetes mellitus without complication ([place])    Encounter for loop recorder check 09/15/2018   H/O: stroke 04/14/2018   Hyperlipemia 06/13/2018   Hypertension    Hypothyroidism 06/13/2018   Loop recorder Biotronik in situ for cryptogenic stroke 06/16/2018   Remote loop recorder check  9.15.20: 1 false AF=SB/1st deg AVB/PACs   Stroke [facility])    Thyroid disease    BP 94/60   Pulse 90   Ht 6' (1.829 m)   SpO2 90%   BMI 22.16 kg/m   Opioid Risk Score:   Fall Risk Score:  `1  Depression screen [facility] 2/9     07/23/2021   12:49 PM  Depression screen PHQ 2/9  Decreased Interest 3   Down, Depressed, Hopeless 1  PHQ - 2 Score 4  Altered sleeping 3  Tired, decreased energy 3  Change in appetite 3  Feeling bad or failure about yourself  1  Trouble concentrating 3  Moving slowly or fidgety/restless 3  Suicidal thoughts 0  PHQ-9 Score 20     Review of Systems  Constitutional:  Positive for chills.  HENT: Negative.    Eyes: Negative.   Respiratory: Negative.    Cardiovascular: Negative.   Gastrointestinal: Negative.   Endocrine: Negative.   Genitourinary: Negative.   Musculoskeletal: Negative.   Skin: Negative.   Allergic/Immunologic: Negative.   Neurological: Negative.   Hematological: Negative.   Psychiatric/Behavioral: Negative.        Objective:   Physical Exam Vitals and nursing note reviewed.  Constitutional:      Appearance: He is normal weight.  HENT:     Head: Normocephalic and atraumatic.  Eyes:     Extraocular Movements: Extraocular movements intact.     Conjunctiva/sclera: Conjunctivae normal.     Pupils: Pupils are equal, round, and reactive to light.  Musculoskeletal:     Comments: No pain with hip or knee range of motion bilaterally. Upper extremity range of motion is within normal limits for age  Skin:    General: Skin is warm and dry.  Neurological:     Mental Status: He is alert and oriented to person, place, and time.     Comments: Motor strength is 5/5 bilateral deltoid bicep tricep grip 3 - at the hip flexors knee extensors ankle dorsiflexors.  Lower extremity sensation not tested due to lower extremity bandaging  Psychiatric:        Mood and Affect: Mood normal.        Behavior: Behavior normal.           Assessment & Plan:   1.  History of recent left pontine infarct with no residual hemiparesis he does have lower extremity weakness mainly due to deconditioning.  He will continue skilled nursing home rehabilitation. No evidence of spasticity We will follow-up with neurology.  Has a signed physician at nursing  home. No need for physical medicine rehab follow-up.  Discussed with patient's brother-in-law who accompanies him today.

## 2021-07-23 NOTE — Patient Instructions (Signed)
Please discontinue Aspirin, just take clopidigrel for stroke prevention   Follow up with Neurology

## 2021-07-27 DIAGNOSIS — K5900 Constipation, unspecified: Secondary | ICD-10-CM | POA: Diagnosis not present

## 2021-07-27 DIAGNOSIS — I639 Cerebral infarction, unspecified: Secondary | ICD-10-CM | POA: Diagnosis not present

## 2021-07-27 DIAGNOSIS — I951 Orthostatic hypotension: Secondary | ICD-10-CM | POA: Diagnosis not present

## 2021-07-27 DIAGNOSIS — R040 Epistaxis: Secondary | ICD-10-CM | POA: Diagnosis not present

## 2021-07-27 DIAGNOSIS — Z8673 Personal history of transient ischemic attack (TIA), and cerebral infarction without residual deficits: Secondary | ICD-10-CM | POA: Diagnosis not present

## 2021-07-27 DIAGNOSIS — G9340 Encephalopathy, unspecified: Secondary | ICD-10-CM | POA: Diagnosis not present

## 2021-07-27 DIAGNOSIS — R2681 Unsteadiness on feet: Secondary | ICD-10-CM | POA: Diagnosis not present

## 2021-07-27 DIAGNOSIS — M542 Cervicalgia: Secondary | ICD-10-CM | POA: Diagnosis not present

## 2021-07-27 DIAGNOSIS — E46 Unspecified protein-calorie malnutrition: Secondary | ICD-10-CM | POA: Diagnosis not present

## 2021-07-27 DIAGNOSIS — M6250 Muscle wasting and atrophy, not elsewhere classified, unspecified site: Secondary | ICD-10-CM | POA: Diagnosis not present

## 2021-07-27 DIAGNOSIS — K920 Hematemesis: Secondary | ICD-10-CM | POA: Diagnosis not present

## 2021-07-29 ENCOUNTER — Inpatient Hospital Stay (HOSPITAL_COMMUNITY): Payer: Medicare Other

## 2021-07-29 ENCOUNTER — Encounter (HOSPITAL_COMMUNITY): Payer: Self-pay | Admitting: Pharmacy Technician

## 2021-07-29 ENCOUNTER — Emergency Department (HOSPITAL_COMMUNITY): Payer: Medicare Other

## 2021-07-29 ENCOUNTER — Inpatient Hospital Stay (HOSPITAL_COMMUNITY)
Admission: EM | Admit: 2021-07-29 | Discharge: 2021-08-25 | DRG: 208 | Disposition: E | Payer: Medicare Other | Source: Skilled Nursing Facility | Attending: Pulmonary Disease | Admitting: Pulmonary Disease

## 2021-07-29 DIAGNOSIS — Z809 Family history of malignant neoplasm, unspecified: Secondary | ICD-10-CM | POA: Diagnosis not present

## 2021-07-29 DIAGNOSIS — K921 Melena: Secondary | ICD-10-CM | POA: Diagnosis not present

## 2021-07-29 DIAGNOSIS — Z77090 Contact with and (suspected) exposure to asbestos: Secondary | ICD-10-CM | POA: Diagnosis present

## 2021-07-29 DIAGNOSIS — R079 Chest pain, unspecified: Secondary | ICD-10-CM | POA: Diagnosis not present

## 2021-07-29 DIAGNOSIS — E785 Hyperlipidemia, unspecified: Secondary | ICD-10-CM | POA: Diagnosis present

## 2021-07-29 DIAGNOSIS — Z20822 Contact with and (suspected) exposure to covid-19: Secondary | ICD-10-CM | POA: Diagnosis present

## 2021-07-29 DIAGNOSIS — I5189 Other ill-defined heart diseases: Secondary | ICD-10-CM | POA: Diagnosis present

## 2021-07-29 DIAGNOSIS — I152 Hypertension secondary to endocrine disorders: Secondary | ICD-10-CM | POA: Diagnosis not present

## 2021-07-29 DIAGNOSIS — Z823 Family history of stroke: Secondary | ICD-10-CM

## 2021-07-29 DIAGNOSIS — J9601 Acute respiratory failure with hypoxia: Secondary | ICD-10-CM | POA: Diagnosis present

## 2021-07-29 DIAGNOSIS — Z7902 Long term (current) use of antithrombotics/antiplatelets: Secondary | ICD-10-CM

## 2021-07-29 DIAGNOSIS — Z66 Do not resuscitate: Secondary | ICD-10-CM | POA: Diagnosis not present

## 2021-07-29 DIAGNOSIS — Z515 Encounter for palliative care: Secondary | ICD-10-CM | POA: Diagnosis not present

## 2021-07-29 DIAGNOSIS — R0789 Other chest pain: Secondary | ICD-10-CM | POA: Diagnosis present

## 2021-07-29 DIAGNOSIS — I2609 Other pulmonary embolism with acute cor pulmonale: Principal | ICD-10-CM | POA: Diagnosis present

## 2021-07-29 DIAGNOSIS — Z8673 Personal history of transient ischemic attack (TIA), and cerebral infarction without residual deficits: Secondary | ICD-10-CM | POA: Diagnosis not present

## 2021-07-29 DIAGNOSIS — R000 Tachycardia, unspecified: Secondary | ICD-10-CM | POA: Diagnosis not present

## 2021-07-29 DIAGNOSIS — R092 Respiratory arrest: Secondary | ICD-10-CM | POA: Diagnosis not present

## 2021-07-29 DIAGNOSIS — Z79899 Other long term (current) drug therapy: Secondary | ICD-10-CM

## 2021-07-29 DIAGNOSIS — E119 Type 2 diabetes mellitus without complications: Secondary | ICD-10-CM | POA: Diagnosis present

## 2021-07-29 DIAGNOSIS — I10 Essential (primary) hypertension: Secondary | ICD-10-CM | POA: Diagnosis present

## 2021-07-29 DIAGNOSIS — I4891 Unspecified atrial fibrillation: Secondary | ICD-10-CM | POA: Diagnosis present

## 2021-07-29 DIAGNOSIS — D72829 Elevated white blood cell count, unspecified: Secondary | ICD-10-CM | POA: Diagnosis present

## 2021-07-29 DIAGNOSIS — I21A1 Myocardial infarction type 2: Secondary | ICD-10-CM | POA: Diagnosis present

## 2021-07-29 DIAGNOSIS — K7200 Acute and subacute hepatic failure without coma: Secondary | ICD-10-CM | POA: Diagnosis present

## 2021-07-29 DIAGNOSIS — E872 Acidosis, unspecified: Secondary | ICD-10-CM | POA: Diagnosis present

## 2021-07-29 DIAGNOSIS — Z79890 Hormone replacement therapy (postmenopausal): Secondary | ICD-10-CM

## 2021-07-29 DIAGNOSIS — I469 Cardiac arrest, cause unspecified: Secondary | ICD-10-CM | POA: Diagnosis not present

## 2021-07-29 DIAGNOSIS — Z794 Long term (current) use of insulin: Secondary | ICD-10-CM | POA: Diagnosis not present

## 2021-07-29 DIAGNOSIS — L8990 Pressure ulcer of unspecified site, unspecified stage: Secondary | ICD-10-CM | POA: Insufficient documentation

## 2021-07-29 DIAGNOSIS — I3139 Other pericardial effusion (noninflammatory): Secondary | ICD-10-CM | POA: Diagnosis not present

## 2021-07-29 DIAGNOSIS — I2602 Saddle embolus of pulmonary artery with acute cor pulmonale: Secondary | ICD-10-CM

## 2021-07-29 DIAGNOSIS — R578 Other shock: Secondary | ICD-10-CM | POA: Diagnosis not present

## 2021-07-29 DIAGNOSIS — I4901 Ventricular fibrillation: Secondary | ICD-10-CM | POA: Diagnosis not present

## 2021-07-29 DIAGNOSIS — E039 Hypothyroidism, unspecified: Secondary | ICD-10-CM | POA: Diagnosis present

## 2021-07-29 DIAGNOSIS — Z880 Allergy status to penicillin: Secondary | ICD-10-CM

## 2021-07-29 DIAGNOSIS — I468 Cardiac arrest due to other underlying condition: Secondary | ICD-10-CM | POA: Diagnosis present

## 2021-07-29 DIAGNOSIS — I2699 Other pulmonary embolism without acute cor pulmonale: Secondary | ICD-10-CM | POA: Diagnosis not present

## 2021-07-29 DIAGNOSIS — I2721 Secondary pulmonary arterial hypertension: Secondary | ICD-10-CM | POA: Diagnosis present

## 2021-07-29 HISTORY — PX: IR ANGIOGRAM PULMONARY NONSELECTIVE CATHETER OR VENOUS INJECTION: IMG665

## 2021-07-29 HISTORY — PX: IR INFUSION THROMBOL ARTERIAL INITIAL (MS): IMG5376

## 2021-07-29 HISTORY — PX: IR US GUIDE VASC ACCESS RIGHT: IMG2390

## 2021-07-29 HISTORY — PX: IR ANGIOGRAM SELECTIVE EACH ADDITIONAL VESSEL: IMG667

## 2021-07-29 LAB — PROTIME-INR
INR: 1.2 (ref 0.8–1.2)
Prothrombin Time: 15.1 seconds (ref 11.4–15.2)

## 2021-07-29 LAB — I-STAT ARTERIAL BLOOD GAS, ED
Acid-base deficit: 5 mmol/L — ABNORMAL HIGH (ref 0.0–2.0)
Bicarbonate: 18.4 mmol/L — ABNORMAL LOW (ref 20.0–28.0)
Calcium, Ion: 1.31 mmol/L (ref 1.15–1.40)
HCT: 34 % — ABNORMAL LOW (ref 39.0–52.0)
Hemoglobin: 11.6 g/dL — ABNORMAL LOW (ref 13.0–17.0)
O2 Saturation: 99 %
Patient temperature: 96.6
Potassium: 3.3 mmol/L — ABNORMAL LOW (ref 3.5–5.1)
Sodium: 136 mmol/L (ref 135–145)
TCO2: 19 mmol/L — ABNORMAL LOW (ref 22–32)
pCO2 arterial: 26.1 mmHg — ABNORMAL LOW (ref 32–48)
pH, Arterial: 7.451 — ABNORMAL HIGH (ref 7.35–7.45)
pO2, Arterial: 111 mmHg — ABNORMAL HIGH (ref 83–108)

## 2021-07-29 LAB — CBC
HCT: 36.4 % — ABNORMAL LOW (ref 39.0–52.0)
Hemoglobin: 12.3 g/dL — ABNORMAL LOW (ref 13.0–17.0)
MCH: 32.8 pg (ref 26.0–34.0)
MCHC: 33.8 g/dL (ref 30.0–36.0)
MCV: 97.1 fL (ref 80.0–100.0)
Platelets: 294 10*3/uL (ref 150–400)
RBC: 3.75 MIL/uL — ABNORMAL LOW (ref 4.22–5.81)
RDW: 15.2 % (ref 11.5–15.5)
WBC: 12.3 10*3/uL — ABNORMAL HIGH (ref 4.0–10.5)
nRBC: 0 % (ref 0.0–0.2)

## 2021-07-29 LAB — COMPREHENSIVE METABOLIC PANEL
ALT: 88 U/L — ABNORMAL HIGH (ref 0–44)
AST: 100 U/L — ABNORMAL HIGH (ref 15–41)
Albumin: 3.1 g/dL — ABNORMAL LOW (ref 3.5–5.0)
Alkaline Phosphatase: 49 U/L (ref 38–126)
Anion gap: 12 (ref 5–15)
BUN: 25 mg/dL — ABNORMAL HIGH (ref 8–23)
CO2: 18 mmol/L — ABNORMAL LOW (ref 22–32)
Calcium: 9.1 mg/dL (ref 8.9–10.3)
Chloride: 106 mmol/L (ref 98–111)
Creatinine, Ser: 1.12 mg/dL (ref 0.61–1.24)
GFR, Estimated: >60 mL/min (ref >60)
Glucose, Bld: 187 mg/dL — ABNORMAL HIGH (ref 70–99)
Potassium: 3.6 mmol/L (ref 3.5–5.1)
Sodium: 136 mmol/L (ref 135–145)
Total Bilirubin: 0.7 mg/dL (ref 0.3–1.2)
Total Protein: 5.9 g/dL — ABNORMAL LOW (ref 6.5–8.1)

## 2021-07-29 LAB — MAGNESIUM: Magnesium: 2.2 mg/dL (ref 1.7–2.4)

## 2021-07-29 LAB — D-DIMER, QUANTITATIVE: D-Dimer, Quant: >20 ug/mL-FEU — ABNORMAL HIGH (ref 0.00–0.50)

## 2021-07-29 LAB — LACTIC ACID, PLASMA: Lactic Acid, Venous: 3.2 mmol/L (ref 0.5–1.9)

## 2021-07-29 LAB — GLUCOSE, CAPILLARY: Glucose-Capillary: 123 mg/dL — ABNORMAL HIGH (ref 70–99)

## 2021-07-29 LAB — MRSA NEXT GEN BY PCR, NASAL: MRSA by PCR Next Gen: NOT DETECTED

## 2021-07-29 LAB — BRAIN NATRIURETIC PEPTIDE: B Natriuretic Peptide: 63.9 pg/mL (ref 0.0–100.0)

## 2021-07-29 LAB — RESP PANEL BY RT-PCR (FLU A&B, COVID) ARPGX2
Influenza A by PCR: NEGATIVE
Influenza B by PCR: NEGATIVE
SARS Coronavirus 2 by RT PCR: NEGATIVE

## 2021-07-29 LAB — TROPONIN I (HIGH SENSITIVITY): Troponin I (High Sensitivity): 126 ng/L (ref <18)

## 2021-07-29 MED ORDER — CHLORHEXIDINE GLUCONATE CLOTH 2 % EX PADS
6.0000 | MEDICATED_PAD | Freq: Every day | CUTANEOUS | Status: DC
  Administered 2021-07-29: 6 via TOPICAL

## 2021-07-29 MED ORDER — MIDAZOLAM HCL 2 MG/2ML IJ SOLN
INTRAMUSCULAR | Status: AC | PRN
  Administered 2021-07-29 (×2): .5 mg via INTRAVENOUS

## 2021-07-29 MED ORDER — FENTANYL CITRATE (PF) 100 MCG/2ML IJ SOLN
INTRAMUSCULAR | Status: AC | PRN
  Administered 2021-07-29 (×2): 12.5 ug via INTRAVENOUS

## 2021-07-29 MED ORDER — LIDOCAINE 5 % EX PTCH
1.0000 | MEDICATED_PATCH | CUTANEOUS | Status: DC
  Administered 2021-07-29: 1 via TRANSDERMAL
  Filled 2021-07-29 (×2): qty 1

## 2021-07-29 MED ORDER — HEPARIN (PORCINE) 25000 UT/250ML-% IV SOLN
900.0000 [IU]/h | INTRAVENOUS | Status: DC
  Administered 2021-07-29: 1300 [IU]/h via INTRAVENOUS
  Filled 2021-07-29: qty 250

## 2021-07-29 MED ORDER — LEVOTHYROXINE SODIUM 88 MCG PO TABS
88.0000 ug | ORAL_TABLET | Freq: Every day | ORAL | Status: DC
  Filled 2021-07-29: qty 1

## 2021-07-29 MED ORDER — IOHEXOL 350 MG/ML SOLN
100.0000 mL | Freq: Once | INTRAVENOUS | Status: AC | PRN
  Administered 2021-07-29: 100 mL via INTRAVENOUS

## 2021-07-29 MED ORDER — PANTOPRAZOLE SODIUM 40 MG PO TBEC: 40.0000 mg | DELAYED_RELEASE_TABLET | Freq: Every day | ORAL | Status: DC

## 2021-07-29 MED ORDER — SODIUM CHLORIDE 0.9 % IV SOLN
12.0000 mg | Freq: Once | INTRAVENOUS | Status: AC
  Administered 2021-07-29: 12 mg via INTRAVENOUS
  Filled 2021-07-29: qty 12

## 2021-07-29 MED ORDER — ORAL CARE MOUTH RINSE: 15.0000 mL | OROMUCOSAL | Status: DC | PRN

## 2021-07-29 MED ORDER — LIDOCAINE HCL (PF) 1 % IJ SOLN
INTRAMUSCULAR | Status: AC | PRN
  Administered 2021-07-29: 20 mL

## 2021-07-29 MED ORDER — LIDOCAINE HCL 1 % IJ SOLN
INTRAMUSCULAR | Status: AC
  Filled 2021-07-29: qty 20

## 2021-07-29 MED ORDER — MIDAZOLAM HCL 2 MG/2ML IJ SOLN
INTRAMUSCULAR | Status: AC
  Filled 2021-07-29: qty 2

## 2021-07-29 MED ORDER — IOHEXOL 300 MG/ML  SOLN
100.0000 mL | Freq: Once | INTRAMUSCULAR | Status: AC | PRN
  Administered 2021-07-29: 25 mL

## 2021-07-29 MED ORDER — SODIUM CHLORIDE 0.9 % IV SOLN: INTRAVENOUS | Status: DC

## 2021-07-29 MED ORDER — SODIUM CHLORIDE 0.9% FLUSH: 3.0000 mL | INTRAVENOUS | Status: DC | PRN

## 2021-07-29 MED ORDER — SODIUM CHLORIDE 0.9 % IV SOLN: 250.0000 mL | INTRAVENOUS | Status: DC | PRN

## 2021-07-29 MED ORDER — HEPARIN BOLUS VIA INFUSION
5000.0000 [IU] | Freq: Once | INTRAVENOUS | Status: AC
  Administered 2021-07-29: 5000 [IU] via INTRAVENOUS
  Filled 2021-07-29: qty 5000

## 2021-07-29 MED ORDER — DOCUSATE SODIUM 100 MG PO CAPS: 100.0000 mg | ORAL_CAPSULE | Freq: Two times a day (BID) | ORAL | Status: DC | PRN

## 2021-07-29 MED ORDER — SODIUM CHLORIDE 0.9% FLUSH
3.0000 mL | Freq: Two times a day (BID) | INTRAVENOUS | Status: DC
  Administered 2021-07-29: 3 mL via INTRAVENOUS

## 2021-07-29 MED ORDER — FENTANYL CITRATE (PF) 100 MCG/2ML IJ SOLN
INTRAMUSCULAR | Status: AC
  Filled 2021-07-29: qty 2

## 2021-07-29 MED ORDER — POLYETHYLENE GLYCOL 3350 17 G PO PACK: 17.0000 g | PACK | Freq: Every day | ORAL | Status: DC | PRN

## 2021-07-29 NOTE — Sedation Documentation (Signed)
Main PA pressure 46/9 (22)

## 2021-07-29 NOTE — Consult Note (Addendum)
Received call from ED provider regarding acute PE of Right main pulmonary artery and lobar level.  Per CTA there is evidence of right heart strain.  Request is for mechanical thrombectomy.  Tentatively planning for  possible procedure pending multidisciplinary discussion with PCCM, and ultimately conversation with family alongside patient evaluation.  Full consult note to follow   Electronically Signed: Pasty Spillers, PA-C 08/23/2021, 3:18 PM

## 2021-07-29 NOTE — Consult Note (Signed)
Chief Complaint: Patient was seen in consultation today for pulmonary embolism at the request of PCCM.  Referring Physician(s): Dr. Vallarie Mare  Supervising Physician: Michaelle Birks  Patient Status: Alan Rubio - ED  History of Present Illness: Alan Rubio is a 88 y.o. male with history of hypertension, hyperlipidemia, atrial fibrillation, CVA (most recent 05/22/2021), hypothyroidism, and type 2 diabetes mellitus.  He is a current resident of [name] and rehab. He was brought to the hospital secondary to [facility] during PT.  He had a cardiac arrest in the ED and was ROSC after 10 minutes of CPR and 1 shock from AED.  Imaging significant for Right main pulmonary artery thrombus with right heart strain.  Labs show elevated Troponin at 126, lactic acid at 3.2, D-dimer >20, WBC 12.3, HgB 11.6, and normal platelets.  Respirations are currently elevated at 24 and he has SpO2 of 100% on non-rebreather at 15LPM.  Temp is 96.79F.   Past Medical History:  Diagnosis Date   A-fib ([place])    Cancer ([place])    Diabetes mellitus without complication ([place])    Encounter for loop recorder check 09/15/2018   H/O: stroke 04/14/2018   Hyperlipemia 06/13/2018   Hypertension    Hypothyroidism 06/13/2018   Loop recorder Biotronik in situ for cryptogenic stroke 06/16/2018   Remote loop recorder check  9.15.20: 1 false AF=SB/1st deg AVB/PACs   Stroke ([place])    Thyroid disease     Past Surgical History:  Procedure Laterality Date   BACK SURGERY     LOOP RECORDER IMPLANT      Allergies: Penicillins  Medications: Prior to Admission medications   Medication Sig Start Date End Date Taking? Authorizing Provider  allopurinol (ZYLOPRIM) 300 MG tablet Take 300 mg by mouth daily.   Yes [provider]  atorvastatin (LIPITOR) 40 MG tablet Take 1 tablet (40 mg total) by mouth daily. 06/09/21  Yes Love, Ivan Anchors, PA-C  clopidogrel (PLAVIX) 75 MG tablet Take 1 tablet (75 mg total) by mouth daily. 06/09/21  Yes Love, Ivan Anchors, PA-C  diclofenac Sodium (VOLTAREN) 1 % GEL Apply 2 g topically 4 (four) times daily. 06/08/21  Yes Love, Ivan Anchors, PA-C  docusate sodium (COLACE) 100 MG capsule Take 100 mg by mouth daily.   Yes [provider]  doxazosin (CARDURA) 4 MG tablet Take 1 tablet (4 mg total) by mouth at bedtime. 06/08/21  Yes Love, Ivan Anchors, PA-C  empagliflozin (JARDIANCE) 10 MG TABS tablet Take 1 tablet (10 mg total) by mouth daily. 06/09/21  Yes Love, Ivan Anchors, PA-C  ezetimibe (ZETIA) 10 MG tablet Take 10 mg by mouth daily.   Yes [provider]  insulin lispro (HUMALOG) 100 UNIT/ML injection Inject 100 Units into the skin in the morning and at bedtime.   Yes [provider]  levothyroxine (SYNTHROID) 88 MCG tablet Take 88 mcg by mouth daily before breakfast.   Yes [provider]  losartan (COZAAR) 50 MG tablet Take 75 mg by mouth daily. 07/06/21  Yes [provider]  megestrol (MEGACE) 400 MG/10ML suspension Take 5 mLs (200 mg total) by mouth 2 (two) times daily. 06/08/21  Yes Love, Ivan Anchors, PA-C  melatonin 5 MG TABS Take 5 mg by mouth at bedtime.   Yes [provider]  Multiple Vitamin (MULTIVITAMIN WITH MINERALS) TABS tablet Take 1 tablet by mouth daily. 06/09/21  Yes Love, Ivan Anchors, PA-C  nutrition supplement, JUVEN, (JUVEN) PACK Take 1 packet by mouth once.   Yes  [provider]  nystatin (MYCOSTATIN/NYSTOP) powder Apply topically 2 (two) times daily. 06/08/21  Yes Love, Ivan Anchors, PA-C  ondansetron (ZOFRAN-ODT) 4 MG disintegrating tablet Take 4 mg by mouth every 8 (eight) hours as needed for nausea or vomiting.   Yes [provider]  pioglitazone (ACTOS) 30 MG tablet Take 1 tablet (30 mg total) by mouth daily. 06/09/21  Yes Love, Ivan Anchors, PA-C  polyethylene glycol (MIRALAX / GLYCOLAX) 17 g packet Take 17 g by mouth 2 (two) times daily. 06/08/21  Yes Love, Ivan Anchors, PA-C  sertraline (ZOLOFT) 25 MG tablet Take 1 tablet (25 mg total) by mouth daily.  06/09/21  Yes Love, Ivan Anchors, PA-C  sodium chloride (OCEAN) 0.65 % SOLN nasal spray Place 1 spray into both nostrils 2 (two) times daily.   Yes [provider]  melatonin 300 MCG TABS Take 5 tablets (1,500 mcg total) by mouth at bedtime. Patient not taking: Reported on 08/16/2021 06/08/21   Love, Ivan Anchors, PA-C  methylphenidate (RITALIN) 5 MG tablet Take 1 tablet (5 mg total) by mouth 2 (two) times daily with breakfast and lunch. Patient not taking: Reported on 07/26/2021 06/09/21   Love, Ivan Anchors, PA-C     Family History  Problem Relation Age of Onset   Stroke Mother    Aneurysm Father    Cancer Brother     Social History   Socioeconomic History   Marital status: Married    Spouse name: Not on file   Number of children: 1   Years of education: Not on file   Highest education level: Not on file  Occupational History   Not on file  Tobacco Use   Smoking status: Never   Smokeless tobacco: Never  Vaping Use   Vaping Use: Never used  Substance and Sexual Activity   Alcohol use: Yes    Comment: Once in a while   Drug use: Never   Sexual activity: Not on file  Other Topics Concern   Not on file  Social History Narrative   Not on file   Social Determinants of Health   Financial Resource Strain: Not on file  Food Insecurity: Not on file  Transportation Needs: Not on file  Physical Activity: Not on file  Stress: Not on file  Social Connections: Not on file     Review of Systems: deferred  Vital Signs: BP 124/72   Pulse 96   Temp (!) 96.6 F (35.9 C)   Resp (!) 28   SpO2 100%   Physical exam: General: Acute on chronically ill-appearing male, lying on the bed, very hard of hearing HEENT: moist mucus membranes Neuro: awake, following commands Chest: Tender to touch, coarse breath sounds, no wheezes or rhonchi Heart: Regular rate Abdomen: Soft, nontender, nondistended, bowel sounds present Skin: No rash  Imaging: CT Angio Chest PE W and/or Wo  Contrast  Result Date: 08/23/2021 CLINICAL DATA:  A male at age 76 presents for evaluation of pulmonary embolism. * Tracking Code: BO * EXAM: CT ANGIOGRAPHY CHEST WITH CONTRAST TECHNIQUE: Multidetector CT imaging of the chest was performed using the standard protocol during bolus administration of intravenous contrast. Multiplanar CT image reconstructions and MIPs were obtained to evaluate the vascular anatomy. RADIATION DOSE REDUCTION: This exam was performed according to the departmental dose-optimization program which includes automated exposure control, adjustment of the mA and/or kV according to patient size and/or use of iterative reconstruction technique. CONTRAST:  154m OMNIPAQUE IOHEXOL 350 MG/ML SOLN COMPARISON:  October 16, 2020. FINDINGS: Cardiovascular: The study is positive for pulmonary embolism in the main pulmonary artery on the RIGHT which tracks into RIGHT upper lobe pulmonary artery with occlusive features and RIGHT lower lobe and middle lobe pulmonary artery with partially occlusive features in the lower lobe. A thin band of thrombus joins areas of thrombi in the LEFT upper lobe which are nonocclusive. There are occlusive emboli in LEFT lower lobe segmental branches. There are signs of RIGHT heart strain with an RV to LV ratio proximally 1.75. There signs of LVH as well. Aortic atherosclerosis both calcified and noncalcified atheromatous plaque within a nonaneurysmal thoracic aorta. Heart size is normal with small pericardial effusion. Subtle nodularity along the pericardium (image 95/6) This measures approximately 6 mm and was previously 2-3 mm. Mediastinum/Nodes: Esophagus is unremarkable. No signs of adenopathy in the chest. Lungs/Pleura: Extensive pleural thickening and pleural calcification with increased thickness of soft tissue associated with pleural calcification particularly in the LEFT anterior upper lobe (image 238/7 and 241/7, soft tissue measuring up to 9 mm greatest thickness  previously approximately 3 mm and extending along approximately 21 mm of the pleural surface. Other areas of soft tissue nodularity along previously described asbestos related pleural disease with similar increasing conspicuity though obscured partially by atelectatic changes in the LEFT lung base. Similar appearance of asbestos related pleural disease with some areas of soft tissue nodularity in the RIGHT chest. No pleural effusion. No pneumothorax. No dense consolidative process. Airways are patent. Upper Abdomen: Incidental imaging of upper abdominal contents with out acute findings to the extent evaluated. Adrenal glands are normal to the extent visualized. Musculoskeletal: Osteopenia and spinal degenerative changes. Degenerative changes in the bilateral glenohumeral joints. No acute or destructive bone process. Review of the MIP images confirms the above findings. IMPRESSION: 1. Positive for acute PE in the RIGHT main pulmonary artery and lobar level in the RIGHT chest and at the multifocal segmental level in the LEFT chest with CT evidence of right heart strain (RV/LV Ratio = 1.75) consistent with at least submassive (intermediate risk) PE. The presence of right heart strain has been associated with an increased risk of morbidity and mortality. Please refer to the "Code PE Focused" order set in EPIC. 2. Worsening of nodularity associated with ex best is related pleural disease and nodularity along the LEFT heart border raising the question of early changes of malignant mesothelioma. 3. Diffuse heterogeneity of marrow spaces in the spine is of uncertain significance. This could represent pattern of osteopenia or marrow reconversion. If the patient does have a history of neoplasm the possibility of metastatic disease is not excluded. Aortic Atherosclerosis (ICD10-I70.0). Critical Value/emergent results were called by telephone at the time of interpretation on 08/13/2021 at 1:50 to provider Godfrey Pick , who  verbally acknowledged these results. Electronically Signed   By: Zetta Bills M.D.   On: 08/04/2021 14:03   CT HEAD WO CONTRAST  Result Date: 07/26/2021 CLINICAL DATA:  Altered mental status EXAM: CT HEAD WITHOUT CONTRAST TECHNIQUE: Contiguous axial images were obtained from the base of the skull through the vertex without intravenous contrast. RADIATION DOSE REDUCTION: This exam was performed according to the departmental dose-optimization program which includes automated exposure control, adjustment of the mA and/or kV according to patient size and/or use of iterative reconstruction technique. COMPARISON:  CT head 06/03/2021 FINDINGS: Brain: No acute intracranial hemorrhage, mass effect, or herniation. No extra-axial fluid collections. No evidence of acute territorial infarct. No hydrocephalus. Mild-to-moderate cortical volume loss. Small area of encephalomalacia  in the right frontal lobe, likely secondary to old infarct. Old lacunar infarct in the left pons. Patchy hypodensities throughout the periventricular and subcortical white matter, likely secondary to chronic microvascular ischemic changes. Vascular: Extensive calcified plaques in the carotid siphons. Skull: Normal. Negative for fracture or focal lesion. Sinuses/Orbits: No acute finding. Other: None. IMPRESSION: Chronic changes as described with no acute intracranial process identified. Electronically Signed   By: Ofilia Neas M.D.   On: 08/08/2021 13:47   DG Chest Portable 1 View  Result Date: 08/01/2021 CLINICAL DATA:  Chest pain. EXAM: PORTABLE CHEST 1 VIEW COMPARISON:  06/02/2021 FINDINGS: Loop recorder projects over the left heart border. Stable cardiomediastinal contours. Aortic atherosclerotic calcifications. Bilateral pleural calcifications identified. No pleural effusion, airspace consolidation or signs of pneumothorax. The visualized osseous structures are unremarkable. IMPRESSION: No acute abnormality. 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 1.  Aortic Atherosclerosis (ICD10-I70.0). Electronically Signed   By: Kerby Moors M.D.   On: 08/01/2021 11:59    Labs:  CBC: Recent Labs    06/02/21 0549 06/03/21 1340 06/05/21 0548 08/21/2021 1154 08/12/2021 1204  WBC 11.3* 10.7* 7.0 12.3*  --   HGB 13.7 15.0 13.2 12.3* 11.6*  HCT 38.8* 46.0 38.4* 36.4* 34.0*  PLT 268 281 277 294  --     COAGS: Recent Labs    05/22/21 1339 08/06/2021 1154  INR 1.0 1.2  APTT 27  --     BMP: Recent Labs    06/02/21 0549 06/03/21 1340 06/05/21 0548 06/08/21 0622 08/20/2021 1154 08/16/2021 1204  NA 137 138 137  --  136 136  K 4.0 4.1 3.9  --  3.6 3.3*  CL 109 107 109  --  106  --   CO2 21* 21* 23  --  18*  --   GLUCOSE 153* 169* 188*  --  187*  --   BUN 23 21 24*  --  25*  --   CALCIUM 9.5 10.0 9.5  --  9.1  --   CREATININE 0.81 0.85 0.77 1.01 1.12  --   GFRNONAA >60 >60 >60 >60 >60  --     LIVER FUNCTION TESTS: Recent Labs    05/22/21 1339 05/26/21 0521 06/03/21 1340 08/04/2021 1154  BILITOT 0.8 0.7 0.8 0.7  AST '24 25 22 '$ 100*  ALT '14 15 18 '$ 88*  ALKPHOS 46 47 50 49  PROT 6.5 6.4* 7.1 5.9*  ALBUMIN 3.7 3.3* 3.4* 3.1*    Assessment and Plan:  Pulmonary embolism Dr. Maryelizabeth Kaufmann thoroughly discussed with wife and stepson treatment options and risks associated.  Together, decision was made to proceed with catheter directed lytic therapy.  Sheaths and catheters are to be placed this evening and lytic treatment is expected to last 12 hours.  Risks and benefits discussed with the patient including, but not limited to bleeding, possible life threatening bleeding and need for blood product transfusion, vascular injury, stroke, contrast induced renal failure, limb loss and infection.  All of the patient's questions were answered, patient is agreeable to proceed. Consent signed and in chart.   Thank you for this interesting consult.  I greatly enjoyed meeting Patrik  Klepper and look forward to participating in their care.  A copy of this report was sent to the requesting provider on this date.  Electronically Signed: Pasty Spillers, PA 08/15/2021, 3:35 PM   I spent a total of 40 Minutes in face to face in clinical consultation, greater than 50% of which  was counseling/coordinating care for pulmonary embolism.

## 2021-07-29 NOTE — Progress Notes (Signed)
eLink Physician-Brief Progress Note Patient Name: Alan Rubio DOB: [birth year] MRN: [id number]   Date of Service  08/16/2021  HPI/Events of Note  Patient c/o chest soreness d/t CPR. Nursing request for Lidocaine patch.   eICU Interventions  Plan: Lidocaine Patch 5% 1 patch to chest wall now and Q 24 hours.      Intervention Category Major Interventions: Other:  Lysle Dingwall 07/26/2021, 8:35 PM

## 2021-07-29 NOTE — H&P (Signed)
NAMETramaine Rubio, MRN:  [id number], DOB:  August 27, 1931, LOS: 0 ADMISSION DATE:  08/03/2021, CONSULTATION DATE:  07/28/2021 REFERRING MD:  Doren Custard, CHIEF COMPLAINT:  out of hospital cardiac arrest  History of Present Illness:  Alan Rubio, is a 88 y.o. male, who presented to the [facility] ED with a chief complaint of out of hospital cardiac arrest  They have a pertinent past medical history of A-fib, DM 2, HLD, HTN, hypothyroidism, stroke (05/22/2021)  Prior to arrival, patient became short of breath on 7/5 while working with PT at rehab.  Shortly after patient found pulseless and apneic by staff.  AED defibrillated x1.  CPR 10 minutes.  99% nonrebreather  ED course was notable for CT chest concerning for acute PE in the right main pulmonary artery and lobar level in the right chest and multifocal segment level of left chest with RV strain: RV/LV ratio 1.75.  The patient was deemed not to be a candidate for tPA due to recent stroke.  IR was consulted.  PCCM was consulted for admission  Pertinent  Medical History  A-fib, DM 2, HLD, HTN, hypothyroidism, stroke (05/22/2021)  Significant Hospital Events: Including procedures, antibiotic start and stop dates in addition to other pertinent events   7/5 admit with, IR consult  Interim History / Subjective:  See above  Subjective: Complains of chest pain secondary to CPR  Objective   Blood pressure 110/65, pulse 95, temperature (!) 96.6 F (35.9 C), resp. rate 19, SpO2 100 %.       No intake or output data in the 24 hours ending 07/31/2021 1452 There were no vitals filed for this visit.  Examination: General: In bed, NAD, appears comfortable HEENT: MM pink/moist, anicteric, atraumatic Neuro: RASS 0, PERRL 66m, GCS 15 CV: S1S2, NSR, no m/r/g appreciated PULM:  clear in the upper lobes, clear in the lower lobes, trachea midline, chest expansion symmetric GI: soft, bsx4 active, non-tender   Extremities: warm/dry, no pretibial edema, capillary refill less  than 3 seconds  Skin: Wound to right foot bandaged and wrapped, left foot wound, no rashes or lesions noted  Labs/imaging Glucose 27 CO2 18 BUN 25 Albumin 3.1 AST 100, ALT 88 WBC 12.3 Hemoglobin 12.3 INR 1.2 Troponin 126, BNP within normal limits Lactic acid 3.2 D-dimer greater than 20 ABG 7.45/26/111/18 0.4 CTA head: No acute abnormalities CTA chest: Acute PE in the right main pulmonary artery and lobar level in the right chest and multifocal segment level of left chest with RV strain: RV/LV ratio 1.75. Worsening of nodularity associated with a is related pleural disease and nodularity along the LEFT heart border.   Resolved Hospital Problem list     Assessment & Plan:  Submassive pulmonary embolism Out of hospital cardiac arrest, secondary to PE Acute respiratory failure with hypoxia. Acute PE in the right main pulmonary artery and lobar level in the right chest and multifocal segment level of left chest with RV strain: RV/LV ratio 1.75.  Patient presented status post cardiac arrest with 1 shock.  History of cancer.  History of asbestos exposure. On 100% NRB. Patient returned to neurologic baseline. CT head with no acute changes. -Patient not a candidate for systemic tPA due to recent stroke.  -IR consult for catheter directed lytics -Goal MAP 65 or greater. Levophed ordered. Titrate medication to goal -Trend lactate -Obtain ECHO -Follow up CBC, CMP, Coags -Admit to ICU with continuous telemetry monitoring and SPO2 monitoring -Goal SPO2 92-98 percent. Titrate O2 to goal. -Defer normothermia protocol  due to patient returning to neurologic baseline  HX CVA/left pontine infarct (05/22/2021) -Resume aspirin/statin/plavix when able  Lactic acidosis Troponin elevation Secondary to PE -Trend  HTN HLD In NSR on exam -Hold home antihypertensives  DM2 -Blood Glucose goal 140-180. -SSI  Hypothyroidism -resume home synthroid  Transaminitis Suspect secondary to  arrest -trend  Leukocytosis Suspect reactive -trend  BL foot wounds -WOC consult  Pleural disease and nodularity along the LEFT heart border Patient with hx of asbestos exposure. Concerning for asbestos related disease vs mesothelioma  -Needs outpatient pulmonary follow up.  Best Practice (right click and "Reselect all SmartList Selections" daily)   Diet/type: NPO w/ oral meds DVT prophylaxis: SCD GI prophylaxis: PPI Lines: N/A Foley:  N/A Code Status:  full code Last date of multidisciplinary goals of care discussion [pending]  Labs   CBC: Recent Labs  Lab 08/24/2021 1154 08/08/2021 1204  WBC 12.3*  --   HGB 12.3* 11.6*  HCT 36.4* 34.0*  MCV 97.1  --   PLT 294  --     Basic Metabolic Panel: Recent Labs  Lab 08/10/2021 1154 08/20/2021 1204  NA 136 136  K 3.6 3.3*  CL 106  --   CO2 18*  --   GLUCOSE 187*  --   BUN 25*  --   CREATININE 1.12  --   CALCIUM 9.1  --   MG 2.2  --    GFR: CrCl cannot be calculated (Unknown ideal weight.). Recent Labs  Lab 08/02/2021 1154  WBC 12.3*  LATICACIDVEN 3.2*    Liver Function Tests: Recent Labs  Lab 08/12/2021 1154  AST 100*  ALT 88*  ALKPHOS 49  BILITOT 0.7  PROT 5.9*  ALBUMIN 3.1*   No results for input(s): "LIPASE", "AMYLASE" in the last 168 hours. No results for input(s): "AMMONIA" in the last 168 hours.  ABG    Component Value Date/Time   PHART 7.451 (H) 08/04/2021 1204   PCO2ART 26.1 (L) 08/07/2021 1204   PO2ART 111 (H) 08/17/2021 1204   HCO3 18.4 (L) 08/22/2021 1204   TCO2 19 (L) 08/13/2021 1204   ACIDBASEDEF 5.0 (H) 08/07/2021 1204   O2SAT 99 08/23/2021 1204     Coagulation Profile: Recent Labs  Lab 08/17/2021 1154  INR 1.2    Cardiac Enzymes: No results for input(s): "CKTOTAL", "CKMB", "CKMBINDEX", "TROPONINI" in the last 168 hours.  HbA1C: Hgb A1c MFr Bld  Date/Time Value Ref Range Status  05/22/2021 04:20 PM 7.7 (H) 4.8 - 5.6 % Final    Comment:    (NOTE) Pre diabetes:           5.7%-6.4%  Diabetes:              >6.4%  Glycemic control for   <7.0% adults with diabetes   04/14/2018 09:55 AM 7.9 (H) 4.8 - 5.6 % Final    Comment:    (NOTE) Pre diabetes:          5.7%-6.4% Diabetes:              >6.4% Glycemic control for   <7.0% adults with diabetes     CBG: No results for input(s): "GLUCAP" in the last 168 hours.  Review of Systems:   Positives in bold  Gen: fever, chills, weight change, fatigue, night sweats HEENT:  blurred vision, double vision, hearing loss, tinnitus, sinus congestion, rhinorrhea, sore throat, neck stiffness, dysphagia PULM:  shortness of breath, cough, sputum production, hemoptysis, wheezing CV: chest pain, edema, orthopnea, paroxysmal nocturnal dyspnea,  palpitations GI:  abdominal pain, nausea, vomiting, diarrhea, hematochezia, melena, constipation, change in bowel habits GU: dysuria, hematuria, polyuria, oliguria, urethral discharge Endocrine: hot or cold intolerance, polyuria, polyphagia or appetite change Derm: rash, dry skin, scaling or peeling skin change Heme: easy bruising, bleeding, bleeding gums Neuro: headache, numbness, weakness, slurred speech, loss of memory or consciousness   Past Medical History:  He,  has a past medical history of A-fib ([place]), Cancer ([place]), Diabetes mellitus without complication ([place]), Encounter for loop recorder check (09/15/2018), H/O: stroke (04/14/2018), Hyperlipemia (06/13/2018), Hypertension, Hypothyroidism (06/13/2018), 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   Surgical History:   Past Surgical History:  Procedure Laterality Date   BACK SURGERY     LOOP RECORDER IMPLANT       Social History:   reports that he has never smoked. He has never used smokeless tobacco. He reports current alcohol use. He reports that he does not use drugs.   Family History:  His family history includes Aneurysm in his father; Cancer in his brother;  Stroke in his mother.   Allergies Allergies  Allergen Reactions   Penicillins Rash    Did it involve swelling of the face/tongue/throat, SOB, or low BP? Yes Did it involve sudden or severe rash/hives, skin peeling, or any reaction on the inside of your mouth or nose? No Did you need to seek medical attention at a hospital or doctor's office? No When did it last happen?       If all above answers are "NO", may proceed with cephalosporin use.        Home Medications  Prior to Admission medications   Medication Sig Start Date End Date Taking? Authorizing Provider  allopurinol (ZYLOPRIM) 300 MG tablet Take 300 mg by mouth daily.   Yes [provider]  atorvastatin (LIPITOR) 40 MG tablet Take 1 tablet (40 mg total) by mouth daily. 06/09/21  Yes Love, Ivan Anchors, PA-C  clopidogrel (PLAVIX) 75 MG tablet Take 1 tablet (75 mg total) by mouth daily. 06/09/21  Yes Love, Ivan Anchors, PA-C  diclofenac Sodium (VOLTAREN) 1 % GEL Apply 2 g topically 4 (four) times daily. 06/08/21  Yes Love, Ivan Anchors, PA-C  docusate sodium (COLACE) 100 MG capsule Take 100 mg by mouth daily.   Yes [provider]  doxazosin (CARDURA) 4 MG tablet Take 1 tablet (4 mg total) by mouth at bedtime. 06/08/21  Yes Love, Ivan Anchors, PA-C  empagliflozin (JARDIANCE) 10 MG TABS tablet Take 1 tablet (10 mg total) by mouth daily. 06/09/21  Yes Love, Ivan Anchors, PA-C  ezetimibe (ZETIA) 10 MG tablet Take 10 mg by mouth daily.   Yes [provider]  insulin lispro (HUMALOG) 100 UNIT/ML injection Inject 100 Units into the skin in the morning and at bedtime.   Yes [provider]  levothyroxine (SYNTHROID) 88 MCG tablet Take 88 mcg by mouth daily before breakfast.   Yes [provider]  losartan (COZAAR) 50 MG tablet Take 75 mg by mouth daily. 07/06/21  Yes [provider]  megestrol (MEGACE) 400 MG/10ML suspension Take 5 mLs (200 mg total) by mouth 2 (two) times daily. 06/08/21  Yes Love, Ivan Anchors,  PA-C  melatonin 5 MG TABS Take 5 mg by mouth at bedtime.   Yes [provider]  Multiple Vitamin (MULTIVITAMIN WITH MINERALS) TABS tablet Take 1 tablet by mouth daily. 06/09/21  Yes Love, Ivan Anchors, PA-C  nutrition supplement, JUVEN, (JUVEN) PACK Take  1 packet by mouth once.   Yes [provider]  nystatin (MYCOSTATIN/NYSTOP) powder Apply topically 2 (two) times daily. 06/08/21  Yes Love, Ivan Anchors, PA-C  ondansetron (ZOFRAN-ODT) 4 MG disintegrating tablet Take 4 mg by mouth every 8 (eight) hours as needed for nausea or vomiting.   Yes [provider]  pioglitazone (ACTOS) 30 MG tablet Take 1 tablet (30 mg total) by mouth daily. 06/09/21  Yes Love, Ivan Anchors, PA-C  polyethylene glycol (MIRALAX / GLYCOLAX) 17 g packet Take 17 g by mouth 2 (two) times daily. 06/08/21  Yes Love, Ivan Anchors, PA-C  sertraline (ZOLOFT) 25 MG tablet Take 1 tablet (25 mg total) by mouth daily. 06/09/21  Yes Love, Ivan Anchors, PA-C  sodium chloride (OCEAN) 0.65 % SOLN nasal spray Place 1 spray into both nostrils 2 (two) times daily.   Yes [provider]  melatonin 300 MCG TABS Take 5 tablets (1,500 mcg total) by mouth at bedtime. Patient not taking: Reported on 08/12/2021 06/08/21   Love, Ivan Anchors, PA-C  methylphenidate (RITALIN) 5 MG tablet Take 1 tablet (5 mg total) by mouth 2 (two) times daily with breakfast and lunch. Patient not taking: Reported on 08/04/2021 06/09/21   Bary Leriche, PA-C     Critical care time: [place] Jerrine Urschel, Jr., MSN, APRN, AGACNP-BC [facility]  08/05/2021 , 4:04 PM  Please see [email or web address] for pager details  If no response, please call [phone] After hours, please call Elink at [phone]

## 2021-07-29 NOTE — ED Notes (Signed)
MD notified of troponin. 

## 2021-07-29 NOTE — Progress Notes (Signed)
ANTICOAGULATION CONSULT NOTE - Follow Up Consult  Pharmacy Consult for heparin Indication: pulmonary embolus  Allergies  Allergen Reactions   Penicillins Rash    Did it involve swelling of the face/tongue/throat, SOB, or low BP? Yes Did it involve sudden or severe rash/hives, skin peeling, or any reaction on the inside of your mouth or nose? No Did you need to seek medical attention at a hospital or doctor's office? No When did it last happen?       If all above answers are "NO", may proceed with cephalosporin use.       Patient Measurements: Weight: 73.4 kg (161 lb 13.1 oz) Heparin Dosing Weight: TBW  Vital Signs: Temp: 97.4 F (36.3 C) (07/05 2004) Temp Source: Oral (07/05 2004) BP: 135/74 (07/05 2100) Pulse Rate: 81 (07/05 2100)  Labs: Recent Labs    08/04/2021 1154 08/13/2021 1204  HGB 12.3* 11.6*  HCT 36.4* 34.0*  PLT 294  --   LABPROT 15.1  --   INR 1.2  --   CREATININE 1.12  --   TROPONINIHS 126*  --      Estimated Creatinine Clearance: 47.3 mL/min (by C-G formula based on SCr of 1.12 mg/dL).   Medical History: Past Medical History:  Diagnosis Date   A-fib (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 06/16/2018   Remote loop recorder check  9.15.20: 1 false AF=SB/1st deg AVB/PACs   Stroke ([employer])    Thyroid disease     Assessment: 25 YOM presenting post-arrest with SOB just prior, CT angio chest with PE, he is not on anticoagulation PTA Heparin drip started prior to patient going to IR for Catheter directed thrombolysis  Difficulty drawing labs Will empirically decrease heparin from 17 to 12 uts/kg/hr while patient on tpa drip   Goal of Therapy:  Heparin level 0.3-0.7 units/ml Monitor platelets by anticoagulation protocol: Yes    Plan:  Decrease heparin drip 900 uts/hr Follow up am labs   [place] Pharm.D. CPP, BCPS Clinical Pharmacist [phone] 08/04/2021 10:13 PM

## 2021-07-29 NOTE — ED Provider Notes (Signed)
[facility] EMERGENCY DEPARTMENT Provider Note   CSN: [id number] Arrival date & time: 08/22/2021  1139     History  No chief complaint on file.   Alan Rubio is a 88 y.o. male.  HPI Patient presents for cardiac arrest.  Currently, he is living in a rehab facility following a stroke.  At baseline, he is reportedly alert and oriented x1.  Prior to arrival, patient was undergoing work with physical therapy.  He became short of breath and a put him in his bed to rest.  When they went to recheck on him, he was reportedly apneic and pulseless.  They initiated CPR.  They placed him on a ZOLL monitor and patient reportedly had an initial shockable rhythm.  He received 1 defibrillatory shock.  They continued CPR for a total of approximately 10 minutes.  On EMS arrival, CPR was ongoing.  When EMS checked his pulses, he did have pulses present.  He also had regain of consciousness and spontaneous respirations.  He was found to have a normal heart rate and normal blood pressure.  He was placed on a nonrebreather.  He was transported to the ED with continued stable vital signs.  On arrival, patient endorses pain in his chest.  He denies any other areas of discomfort.  History per family: Family confirms that patient is alert and oriented x1 at baseline.  They state that he is currently at his mental baseline.    Home Medications Prior to Admission medications   Medication Sig Start Date End Date Taking? Authorizing Provider  allopurinol (ZYLOPRIM) 300 MG tablet Take 300 mg by mouth daily.   Yes [provider]  atorvastatin (LIPITOR) 40 MG tablet Take 1 tablet (40 mg total) by mouth daily. 06/09/21  Yes Love, Ivan Anchors, PA-C  clopidogrel (PLAVIX) 75 MG tablet Take 1 tablet (75 mg total) by mouth daily. 06/09/21  Yes Love, Ivan Anchors, PA-C  diclofenac Sodium (VOLTAREN) 1 % GEL Apply 2 g topically 4 (four) times daily. 06/08/21  Yes Love, Ivan Anchors, PA-C  docusate sodium (COLACE) 100 MG  capsule Take 100 mg by mouth daily.   Yes [provider]  doxazosin (CARDURA) 4 MG tablet Take 1 tablet (4 mg total) by mouth at bedtime. 06/08/21  Yes Love, Ivan Anchors, PA-C  empagliflozin (JARDIANCE) 10 MG TABS tablet Take 1 tablet (10 mg total) by mouth daily. 06/09/21  Yes Love, Ivan Anchors, PA-C  ezetimibe (ZETIA) 10 MG tablet Take 10 mg by mouth daily.   Yes [provider]  insulin lispro (HUMALOG) 100 UNIT/ML injection Inject 100 Units into the skin in the morning and at bedtime.   Yes [provider]  levothyroxine (SYNTHROID) 88 MCG tablet Take 88 mcg by mouth daily before breakfast.   Yes [provider]  losartan (COZAAR) 50 MG tablet Take 75 mg by mouth daily. 07/06/21  Yes [provider]  megestrol (MEGACE) 400 MG/10ML suspension Take 5 mLs (200 mg total) by mouth 2 (two) times daily. 06/08/21  Yes Love, Ivan Anchors, PA-C  melatonin 5 MG TABS Take 5 mg by mouth at bedtime.   Yes [provider]  Multiple Vitamin (MULTIVITAMIN WITH MINERALS) TABS tablet Take 1 tablet by mouth daily. 06/09/21  Yes Love, Ivan Anchors, PA-C  nutrition supplement, JUVEN, (JUVEN) PACK Take 1 packet by mouth once.   Yes [provider]  nystatin (MYCOSTATIN/NYSTOP) powder Apply topically 2 (two) times daily. 06/08/21  Yes Love, Ivan Anchors, PA-C  ondansetron (ZOFRAN-ODT) 4 MG disintegrating tablet Take 4 mg by mouth every 8 (eight) hours as needed for nausea or vomiting.   Yes [provider]  pioglitazone (ACTOS) 30 MG tablet Take 1 tablet (30 mg total) by mouth daily. 06/09/21  Yes Love, Ivan Anchors, PA-C  polyethylene glycol (MIRALAX / GLYCOLAX) 17 g packet Take 17 g by mouth 2 (two) times daily. 06/08/21  Yes Love, Ivan Anchors, PA-C  sertraline (ZOLOFT) 25 MG tablet Take 1 tablet (25 mg total) by mouth daily. 06/09/21  Yes Love, Ivan Anchors, PA-C  sodium chloride (OCEAN) 0.65 % SOLN nasal spray Place 1 spray into both nostrils 2 (two) times daily.   Yes [provider]  melatonin 300 MCG TABS Take 5 tablets (1,500 mcg total) by mouth at bedtime. Patient not taking: Reported on 08/13/2021 06/08/21   Love, Ivan Anchors, PA-C  methylphenidate (RITALIN) 5 MG tablet Take 1 tablet (5 mg total) by mouth 2 (two) times daily with breakfast and lunch. Patient not taking: Reported on 08/21/2021 06/09/21   Bary Leriche, PA-C      Allergies    Penicillins    Review of Systems   Review of Systems  Unable to perform ROS: Dementia  Cardiovascular:  Positive for chest pain.    Physical Exam Updated Vital Signs BP 124/72   Pulse 96   Temp (!) 96.6 F (35.9 C)   Resp (!) 28   SpO2 100%  Physical Exam Vitals and nursing note reviewed.  Constitutional:      General: He is not in acute distress.    Appearance: He is well-developed and normal weight. He is ill-appearing. He is not toxic-appearing or diaphoretic.  HENT:     Head: Normocephalic and atraumatic.     Right Ear: External ear normal.     Left Ear: External ear normal.     Nose: Nose normal.     Mouth/Throat:     Mouth: Mucous membranes are moist.     Pharynx: Oropharynx is clear.  Eyes:     Extraocular Movements: Extraocular movements intact.     Conjunctiva/sclera: Conjunctivae normal.  Cardiovascular:     Rate and Rhythm: Regular rhythm. Tachycardia present.     Heart sounds: No murmur heard. Pulmonary:     Effort: Pulmonary effort is normal. Tachypnea present. No respiratory distress.     Breath sounds: Normal breath sounds. No wheezing or rales.  Chest:     Chest wall: Tenderness present.  Abdominal:     General: Abdomen is flat. There is no distension.     Palpations: Abdomen is soft.     Tenderness: There is no abdominal tenderness.  Musculoskeletal:        General: No swelling. Normal range of motion.     Cervical back: Normal range of motion and neck supple.     Right lower leg: No edema.     Left lower leg: No edema.  Skin:    General: Skin is warm and dry.      Coloration: Skin is not jaundiced.     Findings: No bruising.  Neurological:     General: No focal deficit present.     Mental Status: He is alert. Mental status is at baseline.     GCS: GCS eye subscore is 4. GCS verbal subscore is 5. GCS motor subscore is 6.     Comments: Moving all extremities  Psychiatric:        Mood and Affect: Mood normal.  Behavior: Behavior normal.     ED Results / Procedures / Treatments   Labs (all labs ordered are listed, but only abnormal results are displayed) Labs Reviewed  COMPREHENSIVE METABOLIC PANEL - Abnormal; Notable for the following components:      Result Value   CO2 18 (*)    Glucose, Bld 187 (*)    BUN 25 (*)    Total Protein 5.9 (*)    Albumin 3.1 (*)    AST 100 (*)    ALT 88 (*)    All other components within normal limits  CBC - Abnormal; Notable for the following components:   WBC 12.3 (*)    RBC 3.75 (*)    Hemoglobin 12.3 (*)    HCT 36.4 (*)    All other components within normal limits  LACTIC ACID, PLASMA - Abnormal; Notable for the following components:   Lactic Acid, Venous 3.2 (*)    All other components within normal limits  D-DIMER, QUANTITATIVE - Abnormal; Notable for the following components:   D-Dimer, Quant >20.00 (*)    All other components within normal limits  I-STAT ARTERIAL BLOOD GAS, ED - Abnormal; Notable for the following components:   pH, Arterial 7.451 (*)    pCO2 arterial 26.1 (*)    pO2, Arterial 111 (*)    Bicarbonate 18.4 (*)    TCO2 19 (*)    Acid-base deficit 5.0 (*)    Potassium 3.3 (*)    HCT 34.0 (*)    Hemoglobin 11.6 (*)    All other components within normal limits  TROPONIN I (HIGH SENSITIVITY) - Abnormal; Notable for the following components:   Troponin I (High Sensitivity) 126 (*)    All other components within normal limits  RESP PANEL BY RT-PCR (FLU A&B, COVID) ARPGX2  PROTIME-INR  MAGNESIUM  BRAIN NATRIURETIC PEPTIDE  BLOOD GAS, ARTERIAL  LACTIC ACID, PLASMA  HEPARIN  LEVEL (UNFRACTIONATED)  TROPONIN I (HIGH SENSITIVITY)    EKG EKG Interpretation  Date/Time:  Wednesday July 29 2021 11:50:12 EDT Ventricular Rate:  110 PR Interval:  185 QRS Duration: 90 QT Interval:  295 QTC Calculation: 399 R Axis:   -15 Text Interpretation: Sinus tachycardia Borderline left axis deviation Abnormal T, consider ischemia, lateral leads Confirmed by Godfrey Pick ([id number]) on 08/23/2021 12:13:33 PM  Radiology CT Angio Chest PE W and/or Wo Contrast  Result Date: 08/20/2021 CLINICAL DATA:  A male at age 10 presents for evaluation of pulmonary embolism. * Tracking Code: BO * EXAM: CT ANGIOGRAPHY CHEST WITH CONTRAST TECHNIQUE: Multidetector CT imaging of the chest was performed using the standard protocol during bolus administration of intravenous contrast. Multiplanar CT image reconstructions and MIPs were obtained to evaluate the vascular anatomy. RADIATION DOSE REDUCTION: This exam was performed according to the departmental dose-optimization program which includes automated exposure control, adjustment of the mA and/or kV according to patient size and/or use of iterative reconstruction technique. CONTRAST:  11m OMNIPAQUE IOHEXOL 350 MG/ML SOLN COMPARISON:  October 16, 2020. FINDINGS: Cardiovascular: The study is positive for pulmonary embolism in the main pulmonary artery on the RIGHT which tracks into RIGHT upper lobe pulmonary artery with occlusive features and RIGHT lower lobe and middle lobe pulmonary artery with partially occlusive features in the lower lobe. A thin band of thrombus joins areas of thrombi in the LEFT upper lobe which are nonocclusive. There are occlusive emboli in LEFT lower lobe segmental branches. There are signs of RIGHT heart strain with an RV to LV ratio proximally  1.75. There signs of LVH as well. Aortic atherosclerosis both calcified and noncalcified atheromatous plaque within a nonaneurysmal thoracic aorta. Heart size is normal with small pericardial  effusion. Subtle nodularity along the pericardium (image 95/6) This measures approximately 6 mm and was previously 2-3 mm. Mediastinum/Nodes: Esophagus is unremarkable. No signs of adenopathy in the chest. Lungs/Pleura: Extensive pleural thickening and pleural calcification with increased thickness of soft tissue associated with pleural calcification particularly in the LEFT anterior upper lobe (image 238/7 and 241/7, soft tissue measuring up to 9 mm greatest thickness previously approximately 3 mm and extending along approximately 21 mm of the pleural surface. Other areas of soft tissue nodularity along previously described asbestos related pleural disease with similar increasing conspicuity though obscured partially by atelectatic changes in the LEFT lung base. Similar appearance of asbestos related pleural disease with some areas of soft tissue nodularity in the RIGHT chest. No pleural effusion. No pneumothorax. No dense consolidative process. Airways are patent. Upper Abdomen: Incidental imaging of upper abdominal contents with out acute findings to the extent evaluated. Adrenal glands are normal to the extent visualized. Musculoskeletal: Osteopenia and spinal degenerative changes. Degenerative changes in the bilateral glenohumeral joints. No acute or destructive bone process. Review of the MIP images confirms the above findings. IMPRESSION: 1. Positive for acute PE in the RIGHT main pulmonary artery and lobar level in the RIGHT chest and at the multifocal segmental level in the LEFT chest with CT evidence of right heart strain (RV/LV Ratio = 1.75) consistent with at least submassive (intermediate risk) PE. The presence of right heart strain has been associated with an increased risk of morbidity and mortality. Please refer to the "Code PE Focused" order set in EPIC. 2. Worsening of nodularity associated with ex best is related pleural disease and nodularity along the LEFT heart border raising the question of  early changes of malignant mesothelioma. 3. Diffuse heterogeneity of marrow spaces in the spine is of uncertain significance. This could represent pattern of osteopenia or marrow reconversion. If the patient does have a history of neoplasm the possibility of metastatic disease is not excluded. Aortic Atherosclerosis (ICD10-I70.0). Critical Value/emergent results were called by telephone at the time of interpretation on 08/13/2021 at 1:50 to provider Godfrey Pick , who verbally acknowledged these results. Electronically Signed   By: Zetta Bills M.D.   On: 08/12/2021 14:03   CT HEAD WO CONTRAST  Result Date: 08/17/2021 CLINICAL DATA:  Altered mental status EXAM: CT HEAD WITHOUT CONTRAST TECHNIQUE: Contiguous axial images were obtained from the base of the skull through the vertex without intravenous contrast. RADIATION DOSE REDUCTION: This exam was performed according to the departmental dose-optimization program which includes automated exposure control, adjustment of the mA and/or kV according to patient size and/or use of iterative reconstruction technique. COMPARISON:  CT head 06/03/2021 FINDINGS: Brain: No acute intracranial hemorrhage, mass effect, or herniation. No extra-axial fluid collections. No evidence of acute territorial infarct. No hydrocephalus. Mild-to-moderate cortical volume loss. Small area of encephalomalacia in the right frontal lobe, likely secondary to old infarct. Old lacunar infarct in the left pons. Patchy hypodensities throughout the periventricular and subcortical white matter, likely secondary to chronic microvascular ischemic changes. Vascular: Extensive calcified plaques in the carotid siphons. Skull: Normal. Negative for fracture or focal lesion. Sinuses/Orbits: No acute finding. Other: None. IMPRESSION: Chronic changes as described with no acute intracranial process identified. Electronically Signed   By: Ofilia Neas M.D.   On: 08/07/2021 13:47   DG Chest Portable 1  View  Result Date: 08/21/2021 CLINICAL DATA:  Chest pain. EXAM: PORTABLE CHEST 1 VIEW COMPARISON:  06/02/2021 FINDINGS: Loop recorder projects over the left heart border. Stable cardiomediastinal contours. Aortic atherosclerotic calcifications. Bilateral pleural calcifications identified. No pleural effusion, airspace consolidation or signs of pneumothorax. The visualized osseous structures are unremarkable. IMPRESSION: No acute abnormality. 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 1.  Aortic Atherosclerosis (ICD10-I70.0). Electronically Signed   By: Kerby Moors M.D.   On: 08/15/2021 11:59    Procedures Procedures    Medications Ordered in ED Medications  heparin ADULT infusion 100 units/mL (25000 units/240m) (1,300 Units/hr Intravenous New Bag/Given 08/02/2021 1405)  docusate sodium (COLACE) capsule 100 mg (has no administration in time range)  polyethylene glycol (MIRALAX / GLYCOLAX) packet 17 g (has no administration in time range)  pantoprazole (PROTONIX) EC tablet 40 mg (has no administration in time range)  levothyroxine (SYNTHROID) tablet 88 mcg (has no administration in time range)  iohexol (OMNIPAQUE) 350 MG/ML injection 100 mL (100 mLs Intravenous Contrast Given 07/28/2021 1332)  heparin bolus via infusion 5,000 Units (5,000 Units Intravenous Bolus from Bag 08/19/2021 1405)    ED Course/ Medical Decision Making/ A&P                           Medical Decision Making Amount and/or Complexity of Data Reviewed Labs: ordered. Radiology: ordered.  Risk Prescription drug management. Decision regarding hospitalization.   This patient presents to the ED for concern of cardiac arrest, this involves an extensive number of treatment options, and is a complaint that carries with it a high risk of complications and morbidity.  The differential diagnosis includes arrhythmia, ACS, PE, respiratory arrest, polypharmacy, hypoglycemia   Co  morbidities that complicate the patient evaluation  CVA, anemia, HTN, T2DM, gout, HLD, hypothyroidism   Additional history obtained:  Additional history obtained from EMS, patient's family External records from outside source obtained and reviewed including EMR   Lab Tests:  I Ordered, and personally interpreted labs.  The pertinent results include: Lactic acidosis and elevated troponin are present.  Patient has markedly elevated D-dimer.  Blood gases consistent with respiratory alkalosis.  A leukocytosis is present and patient has had a slight drop in hemoglobin from 1 month ago.   Imaging Studies ordered:  I ordered imaging studies including chest x-ray, CT head, CTA chest I independently visualized and interpreted imaging which showed chronic pleural calcifications are seen on x-ray; no acute findings on CT head; CTA of chest shows acute PE and right main pulmonary artery and multifocal segmental PEs in the left chest.  There is evidence of right heart strain. I agree with the radiologist interpretation   Cardiac Monitoring: / EKG:  The patient was maintained on a cardiac monitor.  I personally viewed and interpreted the cardiac monitored which showed an underlying rhythm of: Sinus rhythm   Consultations Obtained:  I requested consultation with the interventional radiologist and intensivist,  and discussed lab and imaging findings as well as pertinent plan - they recommend: Admission to ICU and decision for lytics to be determined   Problem List / ED Course / Critical interventions / Medication management  Patient is a 88year old male presenting from a rehab facility, where he resides following a recent stroke, for cardiac arrest.  Initial history is provided by EMS.  They report that patient received approximately 10 minutes of CPR prior to their arrival.  On their arrival, patient had  regained pulses, regain of consciousness, and spontaneous breathing.  He was placed on 15 L  of supplemental oxygen by nonrebreather.  No other interventions were provided prior to arrival.  On arrival, patient is a GCS of 15.  He is mildly tachycardic but normotensive.  He endorses pain in his chest, which I suspect is secondary to the CPR.  Tenderness is present.  He is able to tell me his name, which is reportedly his mental baseline.  He is able to move all extremities.  Laboratory work-up was initiated.  On chest x-ray, no acute findings identified.  Patient does have a small increase in troponin.  Notably on lab work, patient has markedly elevated D-dimer.  This did prompt a CTA of his chest.  CTA of chest showed acute PEs in right main pulmonary artery and multifocal segmental areas of left lung.  There is evidence of heart strain.  Given this large PE burden, I do suspect that patient did have an actual cardiac arrest.  Given his stable vital signs while in the ED, decision for lytics was deferred to critical care and interventional radiologist, who are both consulted.  Patient's family, who joined him at bedside, were updated.  Patient was admitted to ICU. I ordered medication including heparin for pulmonary emboli Reevaluation of the patient after these medicines showed that the patient stayed the same I have reviewed the patients home medicines and have made adjustments as needed   Social Determinants of Health:  Currently residing in rehab facility for recent stroke  CRITICAL CARE Performed by: Godfrey Pick   Total critical care time: 35 minutes  Critical care time was exclusive of separately billable procedures and treating other patients.  Critical care was necessary to treat or prevent imminent or life-threatening deterioration.  Critical care was time spent personally by me on the following activities: development of treatment plan with patient and/or surrogate as well as nursing, discussions with consultants, evaluation of patient's response to treatment, examination of  patient, obtaining history from patient or surrogate, ordering and performing treatments and interventions, ordering and review of laboratory studies, ordering and review of radiographic studies, pulse oximetry and re-evaluation of patient's condition.         Final Clinical Impression(s) / ED Diagnoses Final diagnoses:  Cardiac arrest ([place])  Acute pulmonary embolism with acute cor pulmonale, unspecified pulmonary embolism type [facility])    Rx / DC Orders ED Discharge Orders     None         Godfrey Pick, MD 08/15/2021 1624

## 2021-07-29 NOTE — Progress Notes (Signed)
ABG results given to Dr. Doren Custard. No new orders received at this time. RT will continue to monitor and be available as needed.

## 2021-07-29 NOTE — ED Notes (Signed)
MD notified of Lactic

## 2021-07-29 NOTE — Progress Notes (Signed)
RT responded to post CPR. Upon pt arrival pt in no respiratory distress, Spo2 99% on NRB and clear/diminished bilateral breath sounds. No further needs at this time and RT released at this time by ED MD

## 2021-07-29 NOTE — Procedures (Signed)
Vascular and Interventional Radiology Procedure Note  Patient: Alan Rubio DOB: [birth year] Medical Record Number: [id number] Note Date/Time: 08/10/2021 7:44 PM   Performing Physician: Michaelle Birks, MD Assistant(s): Miachel Roux, MD  Diagnosis: Cardiac arrest 2/2 PE. Pulmonary HTN w R heart dysfunction  Procedure:   PULMONARY ARTERIOGRAPHY THROMBOLYSIS, Catheter-directed   Anesthesia: Conscious Sedation Complications: None Estimated Blood Loss: Minimal Specimens: None  Findings:  - access via the RIGHT femoral vein. - Pulmonary arterial pressures, 46/9 [22 mean PAP] - PE burden greatest at RUL, with successful lytic catheter placement(s).  - RIGHT Uni-Fuse; 90 cm infusion and 30 cm total length catheter - LEFT Uni-Fuse; 90 cm infusion and 5 cm total length catheter   Plan: - each infusion catheter will receive 1 mg/hr tPA, x 12 hours, for a total of 24 mg tPA over 12 hours - q6h H/h, fibrinogen, and heparin levels - the sheaths will receive each 20 mL/hr of NS gtt for KVO - NPO vs Clear liquid diet recommended - Pharmacy [facility] team) consult for heparin level management.  - Please page PCCM and VIR MD on call if fibrinogen <150, severe HA, AMS, or new / acute GIB - Bedrest with RLE straight x12hrs, while the catheters are in place.  - VIR service to follow and will assess catheter function for removal on rounds in AM.  Final report to follow once all images are reviewed and compared with previous studies.  See detailed dictation with images in PACS. The patient tolerated the procedure well without incident or complication and was returned to ICU in stable condition.    Michaelle Birks, MD [facility]   Pager. [place]

## 2021-07-29 NOTE — Progress Notes (Signed)
ANTICOAGULATION CONSULT NOTE - Initial Consult  Pharmacy Consult for heparin Indication: pulmonary embolus  Allergies  Allergen Reactions   Penicillins Rash    Did it involve swelling of the face/tongue/throat, SOB, or low BP? Yes Did it involve sudden or severe rash/hives, skin peeling, or any reaction on the inside of your mouth or nose? No Did you need to seek medical attention at a hospital or doctor's office? No When did it last happen?       If all above answers are "NO", may proceed with cephalosporin use.       Patient Measurements:   Heparin Dosing Weight: TBW  Vital Signs: Temp: 96.6 F (35.9 C) (07/05 1142) BP: 105/61 (07/05 1230) Pulse Rate: 103 (07/05 1230)  Labs: Recent Labs    07/26/2021 1154 08/19/2021 1204  HGB 12.3* 11.6*  HCT 36.4* 34.0*  PLT 294  --   LABPROT 15.1  --   INR 1.2  --   CREATININE 1.12  --   TROPONINIHS 126*  --     CrCl cannot be calculated (Unknown ideal weight.).   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

## 2021-07-29 NOTE — ED Triage Notes (Signed)
Pt bib ems from [place] with reports of becoming [facility] during PT. Pt placed back in his bed. PT went to notify provider and when they came back pt was pulseless apneic and staff started CPR, AED advised 1 defib. CPR X approx 10 minutes. 97% on NRB. Lungs CTAB.  120/64 HR 110 ST 12 Lead unremarkable, posterior 12 lead unremarkable

## 2021-07-29 NOTE — Sedation Documentation (Signed)
Patient transported to 2 M ICU with TPA infusing via lysis catheters x 2. Met by bedside RN. Report given. Notified RN of possible oozing from groin site. Call IR if pads become saturated. NO drainage noted at this time however. Infusion to continue for 12 hours and IR team will come back in morning and measure PA pressures.

## 2021-07-29 NOTE — Progress Notes (Addendum)
Patient with multiple labs due for post-procedure from IR. Patient with tPA actively being infused via R femoral sheath and received from IR at ~ 1940. IR reported that the plan is to assess sheath in the morning. Neither PIV will draw back blood to send for labs. Patient unable to be stuck for labs per orders. [facility] MD and main pharmacy notified.    Antonieta Pert, RN  08/21/2021

## 2021-07-30 ENCOUNTER — Inpatient Hospital Stay (HOSPITAL_COMMUNITY): Payer: Medicare Other

## 2021-07-30 DIAGNOSIS — L8990 Pressure ulcer of unspecified site, unspecified stage: Secondary | ICD-10-CM | POA: Insufficient documentation

## 2021-07-30 DIAGNOSIS — I2602 Saddle embolus of pulmonary artery with acute cor pulmonale: Secondary | ICD-10-CM | POA: Diagnosis not present

## 2021-07-30 DIAGNOSIS — I2609 Other pulmonary embolism with acute cor pulmonale: Secondary | ICD-10-CM | POA: Diagnosis not present

## 2021-07-30 DIAGNOSIS — I469 Cardiac arrest, cause unspecified: Secondary | ICD-10-CM | POA: Diagnosis not present

## 2021-07-30 HISTORY — PX: IR THROMB F/U EVAL ART/VEN FINAL DAY (MS): IMG5379

## 2021-07-30 LAB — BASIC METABOLIC PANEL
Anion gap: 14 (ref 5–15)
BUN: 24 mg/dL — ABNORMAL HIGH (ref 8–23)
CO2: 13 mmol/L — ABNORMAL LOW (ref 22–32)
Calcium: 8.7 mg/dL — ABNORMAL LOW (ref 8.9–10.3)
Chloride: 112 mmol/L — ABNORMAL HIGH (ref 98–111)
Creatinine, Ser: 1 mg/dL (ref 0.61–1.24)
GFR, Estimated: >60 mL/min (ref >60)
Glucose, Bld: 172 mg/dL — ABNORMAL HIGH (ref 70–99)
Potassium: 4.2 mmol/L (ref 3.5–5.1)
Sodium: 139 mmol/L (ref 135–145)

## 2021-07-30 LAB — COMPREHENSIVE METABOLIC PANEL
ALT: 738 U/L — ABNORMAL HIGH (ref 0–44)
AST: 695 U/L — ABNORMAL HIGH (ref 15–41)
Albumin: 1.5 g/dL — ABNORMAL LOW (ref 3.5–5.0)
Alkaline Phosphatase: 33 U/L — ABNORMAL LOW (ref 38–126)
Anion gap: 17 — ABNORMAL HIGH (ref 5–15)
BUN: 23 mg/dL (ref 8–23)
CO2: 18 mmol/L — ABNORMAL LOW (ref 22–32)
Calcium: 9.3 mg/dL (ref 8.9–10.3)
Chloride: 114 mmol/L — ABNORMAL HIGH (ref 98–111)
Creatinine, Ser: 1.13 mg/dL (ref 0.61–1.24)
GFR, Estimated: >60 mL/min (ref >60)
Glucose, Bld: 153 mg/dL — ABNORMAL HIGH (ref 70–99)
Potassium: 4.5 mmol/L (ref 3.5–5.1)
Sodium: 149 mmol/L — ABNORMAL HIGH (ref 135–145)
Total Bilirubin: 0.8 mg/dL (ref 0.3–1.2)
Total Protein: <3 g/dL — ABNORMAL LOW (ref 6.5–8.1)

## 2021-07-30 LAB — FIBRINOGEN: Fibrinogen: 252 mg/dL (ref 210–475)

## 2021-07-30 LAB — CBC
HCT: 21.5 % — ABNORMAL LOW (ref 39.0–52.0)
Hemoglobin: 6.8 g/dL — CL (ref 13.0–17.0)
MCH: 31.9 pg (ref 26.0–34.0)
MCHC: 31.6 g/dL (ref 30.0–36.0)
MCV: 100.9 fL — ABNORMAL HIGH (ref 80.0–100.0)
Platelets: 185 10*3/uL (ref 150–400)
RBC: 2.13 MIL/uL — ABNORMAL LOW (ref 4.22–5.81)
RDW: 15.4 % (ref 11.5–15.5)
WBC: 13.1 10*3/uL — ABNORMAL HIGH (ref 4.0–10.5)
nRBC: 0.6 % — ABNORMAL HIGH (ref 0.0–0.2)

## 2021-07-30 LAB — PHOSPHORUS: Phosphorus: 3.8 mg/dL (ref 2.5–4.6)

## 2021-07-30 LAB — TROPONIN I (HIGH SENSITIVITY): Troponin I (High Sensitivity): 1481 ng/L (ref <18)

## 2021-07-30 LAB — PREPARE RBC (CROSSMATCH)

## 2021-07-30 LAB — GLUCOSE, CAPILLARY
Glucose-Capillary: 164 mg/dL — ABNORMAL HIGH (ref 70–99)
Glucose-Capillary: 142 mg/dL — ABNORMAL HIGH (ref 70–99)

## 2021-07-30 LAB — LACTIC ACID, PLASMA: Lactic Acid, Venous: >9 mmol/L (ref 0.5–1.9)

## 2021-07-30 LAB — ABO/RH: ABO/RH(D): A POS

## 2021-07-30 LAB — PROTIME-INR
INR: 1.8 — ABNORMAL HIGH (ref 0.8–1.2)
Prothrombin Time: 21 seconds — ABNORMAL HIGH (ref 11.4–15.2)

## 2021-07-30 LAB — HEPARIN LEVEL (UNFRACTIONATED): Heparin Unfractionated: 0.34 IU/mL (ref 0.30–0.70)

## 2021-07-30 LAB — MAGNESIUM: Magnesium: 2.2 mg/dL (ref 1.7–2.4)

## 2021-07-30 MED ORDER — NOREPINEPHRINE 4 MG/250ML-% IV SOLN: 0.0000 ug/min | INTRAVENOUS | Status: DC

## 2021-07-30 MED ORDER — PHENYLEPHRINE HCL-NACL 20-0.9 MG/250ML-% IV SOLN
INTRAVENOUS | Status: AC
  Filled 2021-07-30: qty 250

## 2021-07-30 MED ORDER — SODIUM CHLORIDE 0.9% IV SOLUTION
Freq: Once | INTRAVENOUS | Status: AC
Start: 2021-07-30 — End: 2021-07-30
  Administered 2021-07-30: 100 mL via INTRAVENOUS

## 2021-07-30 MED ORDER — VASOPRESSIN 20 UNITS/100 ML INFUSION FOR SHOCK
0.0000 [IU]/min | INTRAVENOUS | Status: DC
  Administered 2021-07-30: 0.03 [IU]/min via INTRAVENOUS
  Filled 2021-07-30: qty 100

## 2021-07-30 MED ORDER — AMIODARONE HCL IN DEXTROSE 360-4.14 MG/200ML-% IV SOLN: 30.0000 mg/h | INTRAVENOUS | Status: DC

## 2021-07-30 MED ORDER — SUCCINYLCHOLINE CHLORIDE 200 MG/10ML IV SOSY
PREFILLED_SYRINGE | INTRAVENOUS | Status: AC
  Filled 2021-07-30: qty 10

## 2021-07-30 MED ORDER — SODIUM CHLORIDE 0.9% IV SOLUTION
Freq: Once | INTRAVENOUS | Status: AC
  Administered 2021-07-30: 100 mL via INTRAVENOUS

## 2021-07-30 MED ORDER — EPINEPHRINE HCL 5 MG/250ML IV SOLN IN NS
0.5000 ug/min | INTRAVENOUS | Status: DC
  Filled 2021-07-30: qty 250

## 2021-07-30 MED ORDER — PHENYLEPHRINE CONCENTRATED 100MG/250ML (0.4 MG/ML) INFUSION SIMPLE
0.0000 ug/min | INTRAVENOUS | Status: DC
  Administered 2021-07-30: 300 ug/min via INTRAVENOUS
  Filled 2021-07-30 (×2): qty 250

## 2021-07-30 MED ORDER — ETOMIDATE 2 MG/ML IV SOLN
INTRAVENOUS | Status: AC
  Filled 2021-07-30: qty 20

## 2021-07-30 MED ORDER — KETOROLAC TROMETHAMINE 15 MG/ML IJ SOLN
15.0000 mg | Freq: Once | INTRAMUSCULAR | Status: AC
  Administered 2021-07-30: 15 mg via INTRAVENOUS
  Filled 2021-07-30: qty 1

## 2021-07-30 MED ORDER — NOREPINEPHRINE 16 MG/250ML-% IV SOLN
0.0000 ug/min | INTRAVENOUS | Status: DC
  Administered 2021-07-30: 40 ug/min via INTRAVENOUS
  Filled 2021-07-30: qty 250

## 2021-07-30 MED ORDER — SODIUM CHLORIDE 0.9 % IV BOLUS
500.0000 mL | Freq: Once | INTRAVENOUS | Status: AC
  Administered 2021-07-30: 500 mL via INTRAVENOUS

## 2021-07-30 MED ORDER — FENTANYL CITRATE (PF) 100 MCG/2ML IJ SOLN
25.0000 ug | Freq: Once | INTRAMUSCULAR | Status: AC
  Administered 2021-07-30: 25 ug via INTRAVENOUS
  Filled 2021-07-30: qty 2

## 2021-07-30 MED ORDER — ROCURONIUM BROMIDE 10 MG/ML (PF) SYRINGE
PREFILLED_SYRINGE | INTRAVENOUS | Status: AC
  Filled 2021-07-30: qty 10

## 2021-07-30 MED ORDER — SODIUM BICARBONATE 8.4 % IV SOLN: 100.0000 meq | INTRAVENOUS | Status: AC

## 2021-07-30 MED ORDER — SODIUM CHLORIDE 0.9 % IV BOLUS: 1000.0000 mL | INTRAVENOUS | Status: DC

## 2021-07-30 MED ORDER — FENTANYL CITRATE (PF) 100 MCG/2ML IJ SOLN
INTRAMUSCULAR | Status: AC
  Filled 2021-07-30: qty 2

## 2021-07-30 MED ORDER — MORPHINE 100MG IN NS 100ML (1MG/ML) PREMIX INFUSION
0.0000 mg/h | INTRAVENOUS | Status: DC
  Administered 2021-07-30: 1 mg/h via INTRAVENOUS
  Filled 2021-07-30: qty 100

## 2021-07-30 MED ORDER — KETAMINE HCL 50 MG/5ML IJ SOSY
PREFILLED_SYRINGE | INTRAMUSCULAR | Status: AC
  Filled 2021-07-30: qty 5

## 2021-07-30 MED ORDER — MIDAZOLAM HCL 2 MG/2ML IJ SOLN
INTRAMUSCULAR | Status: AC
  Filled 2021-07-30: qty 2

## 2021-07-30 MED ORDER — SODIUM BICARBONATE 8.4 % IV SOLN
INTRAVENOUS | Status: AC
  Administered 2021-07-30: 100 meq via INTRAVENOUS
  Filled 2021-07-30: qty 100

## 2021-07-30 MED ORDER — SODIUM CHLORIDE 0.9 % IV SOLN
0.5000 ug/min | INTRAVENOUS | Status: DC
  Administered 2021-07-30: 20 ug/min via INTRAVENOUS
  Filled 2021-07-30: qty 10

## 2021-07-30 MED ORDER — PHENYLEPHRINE HCL-NACL 20-0.9 MG/250ML-% IV SOLN
25.0000 ug/min | INTRAVENOUS | Status: DC
  Administered 2021-07-30: 25 ug/min via INTRAVENOUS
  Filled 2021-07-30: qty 250

## 2021-07-30 MED ORDER — PANTOPRAZOLE SODIUM 40 MG IV SOLR: 40.0000 mg | Freq: Two times a day (BID) | INTRAVENOUS | Status: DC

## 2021-07-30 MED ORDER — LIP MEDEX EX OINT
TOPICAL_OINTMENT | CUTANEOUS | Status: DC | PRN
Start: 2021-07-30 — End: 2021-07-30

## 2021-07-30 MED ORDER — LEVOTHYROXINE SODIUM 88 MCG PO TABS: 88.0000 ug | ORAL_TABLET | Freq: Every day | ORAL | Status: DC

## 2021-07-30 MED ORDER — EPINEPHRINE HCL 5 MG/250ML IV SOLN IN NS: 0.5000 ug/min | INTRAVENOUS | Status: DC

## 2021-07-30 MED ORDER — NOREPINEPHRINE 4 MG/250ML-% IV SOLN
INTRAVENOUS | Status: AC
  Administered 2021-07-30: 25 ug/min via INTRAVENOUS
  Filled 2021-07-30: qty 250

## 2021-07-30 MED ORDER — AMIODARONE HCL IN DEXTROSE 360-4.14 MG/200ML-% IV SOLN: 60.0000 mg/h | INTRAVENOUS | Status: DC

## 2021-07-30 MED ORDER — LACTATED RINGERS IV BOLUS
1000.0000 mL | Freq: Once | INTRAVENOUS | Status: AC
Start: 2021-07-30 — End: 2021-07-30
  Administered 2021-07-30: 1000 mL via INTRAVENOUS

## 2021-07-30 MED ORDER — SODIUM CHLORIDE 0.9 % IV SOLN
250.0000 mL | INTRAVENOUS | Status: DC
  Administered 2021-07-30: 250 mL via INTRAVENOUS

## 2021-07-30 MED ORDER — NALOXONE HCL 0.4 MG/ML IJ SOLN
INTRAMUSCULAR | Status: AC
  Filled 2021-07-30: qty 1

## 2021-07-31 LAB — POCT I-STAT 7, (LYTES, BLD GAS, ICA,H+H)
Acid-base deficit: 7 mmol/L — ABNORMAL HIGH (ref 0.0–2.0)
Bicarbonate: 17.6 mmol/L — ABNORMAL LOW (ref 20.0–28.0)
Calcium, Ion: 1.2 mmol/L (ref 1.15–1.40)
HCT: 15 % — ABNORMAL LOW (ref 39.0–52.0)
Hemoglobin: 5.1 g/dL — CL (ref 13.0–17.0)
O2 Saturation: 100 %
Patient temperature: 96
Potassium: 3.4 mmol/L — ABNORMAL LOW (ref 3.5–5.1)
Sodium: 150 mmol/L — ABNORMAL HIGH (ref 135–145)
TCO2: 18 mmol/L — ABNORMAL LOW (ref 22–32)
pCO2 arterial: 26.5 mmHg — ABNORMAL LOW (ref 32–48)
pH, Arterial: 7.426 (ref 7.35–7.45)
pO2, Arterial: 335 mmHg — ABNORMAL HIGH (ref 83–108)

## 2021-08-01 LAB — BPAM RBC
Blood Product Expiration Date: 202307062359
Blood Product Expiration Date: 202307062359
Blood Product Expiration Date: 202307242359
Blood Product Expiration Date: 202307242359
ISSUE DATE / TIME: 202307060728
ISSUE DATE / TIME: 202307060753
Unit Type and Rh: 6200
Unit Type and Rh: 6200
Unit Type and Rh: 9500
Unit Type and Rh: 9500

## 2021-08-01 LAB — TYPE AND SCREEN
ABO/RH(D): A POS
Antibody Screen: NEGATIVE
Unit division: 0
Unit division: 0
Unit division: 0
Unit division: 0

## 2021-08-24 MED FILL — Medication: Qty: 1 | Status: AC

## 2021-08-25 NOTE — Death Summary Note (Signed)
DEATH SUMMARY   Patient Details  Name: Alan Rubio MRN: [id number] DOB: 10-01-1931  Admission/Discharge Information   Admit Date:  2021-08-11  Date of Death: Date of Death: 08/12/2021  Time of Death: Time of Death: 1001  Length of Stay: 1  Referring Physician: [name], MD   Reason(s) for Hospitalization  Cardiac Arrest Pulmonary Embolism  Diagnoses  Preliminary cause of death:  Pulmonary Embolism Hemorrhagic Shock  Secondary Diagnoses (including complications and co-morbidities):  Principal Problem:   Pulmonary embolism (HCC) Active Problems:   Pressure injury of skin Atrial Fibrillation Diabetes Mellitus Type II Hypertension Hyperlipidemia Stroke  Brief Hospital Course (including significant findings, care, treatment, and services provided and events leading to death)  Alan Rubio is a 88 y.o. year old male with history of atrial fibrillation, hypertension, DMII and CVA 05/22/21 who presented to [facility] 08-11-21 after suffering cardiac arrest at rehab. CPR was performed with AED shock given and ROSC obtained after 10 minutes. He was brought to [facility] ER where CTA chest discovered pulmonary emboli involving the right main pulmonary artery and a left segmental emboli. He had right heart strain on CT. IR was consulted given the massive PE and cathter directed TPA protocol was initiated. Patient was admitted to the ICU. The morning of July 19, 86 24 he developed hypotension and loss of pulse. CPR was performed with ROSC obtained after 10 minutes. He was started on amiodarone for concern for ventricular fibrillation. His hemoglobin was noted to drop from 12 to 6g/dL. There was concern for acute blood loss in setting of TPA which was stopped. He was given multiple blood transfusions along with IV fluid resuscitation without significant improvement in his blood pressure. He was maxed out on multiple vasopressors including levophed, epinephrine, vasopressin and neosynephrine. The family was updated at  bedside, his code status was changed to DNR. He continued to decline and morphine was started for comfort measures. Patient died at 10:01am with family at bedside.     Pertinent Labs and Studies  Significant Diagnostic Studies IR Angiogram Pulmonary Nonselective Catheter Or VenoUS Injection  Result Date: 2021/08/12 INDICATION: Cardiac arrest 2/2 PE. Pulmonary HTN w R heart dysfunction EXAM: 1. ULTRASOUND GUIDANCE FOR VENOUS ACCESS X2 2. PULMONARY ARTERIOGRAPHY 3. FLUOROSCOPIC GUIDED PLACEMENT OF BILATERAL PULMONARY ARTERIAL LYTIC INFUSION CATHETERS COMPARISON:  None Available. MEDICATIONS: None ANESTHESIA/SEDATION: Moderate (conscious) sedation was employed during this procedure. A total of Versed 1 mg and Fentanyl 25 mcg was administered intravenously. Moderate Sedation Time: 79 minutes. The patient's level of consciousness and vital signs were monitored continuously by radiology nursing throughout the procedure under my direct supervision. CONTRAST:  24m OMNIPAQUE IOHEXOL 300 MG/ML  SOLN FLUOROSCOPY TIME:  Fluoroscopic dose; 1373mGy COMPLICATIONS: None immediate. TECHNIQUE: Informed written consent was obtained from the the patient and/or patient's representative after a discussion of the risks, benefits and alternatives to treatment. Questions regarding the procedure were encouraged and answered. A timeout was performed prior to the initiation of the procedure. Ultrasound scanning was performed of the RIGHT groin and demonstrated wide patency of the right common femoral vein. As such, the right common femoral vein was selected venous access. The right groin was prepped and draped in the usual sterile fashion, and a sterile drape was applied covering the operative field. Maximum barrier sterile technique with sterile gowns and gloves were used for the procedure. A timeout was performed prior to the initiation of the procedure. Local anesthesia was provided with 1% lidocaine. Under direct ultrasound  guidance, the right common femoral vein was  accessed with a micro puncture kit ultimately allowing placement of a 6 Fr 35 cm vascular sheath. Slightly cranial to this initial access, the right common femoral was again accessed with an additional micropuncture kit ultimately allowing placement of an additional 6 Fr 35 cm vascular sheath. Ultrasound images were saved for procedural documentation purposes. With the use of a Bentson wire, an angled pulmonary catheter was advanced into the main pulmonary artery and a limited central pulmonary arteriogram was performed. Pressure measurements were then obtained from the main pulmonary artery. A vertebral catheter was advanced into the distal branch of the left lower lobe pulmonary artery. Limited contrast injection confirmed appropriate positioning. Over an exchange length stiff Amplatz, the catheter was exchanged for a 90/5 cm multi side-hole infusion catheter. The procedure was then repeated on the patient's RIGHT pulmonary artery, and with the use of a stiff Amplatz, a vertebral catheter was advanced into a distal branch of the RIGHT lower lobe pulmonary artery. Limited contrast injection confirmed appropriate positioning. Over an exchange length stiff Amplatz, the catheter was exchanged for a 90/30 cm multi side-hole infusion catheter. A postprocedural fluoroscopic image was obtained to document final catheter positioning. The external catheter tubing was secured at the right thigh and the lytic therapy was initiated. The patient tolerated the procedure well without immediate postprocedural complication. FINDINGS: Central pulmonary arteriogram demonstrates no central pulmonary embolus. Known bilateral pulmonary emboli with greatest burden in the RIGHT upper lobe. Acquired pressure measurements: Main  pulmonary artery-46/9; mean-22 (normal: < 25/10) Following the procedure, both ultrasound assisted infusion catheter tips terminate within the distal aspects of the  bilateral lower lobe sub segmental pulmonary arteries. IMPRESSION: 1. Successful initiation of bilateral catheter directed pulmonary arterial lysis for submassive pulmonary embolism and right-sided heart strain. 2. Elevated pressure measurements within the main pulmonary artery compatible with critical pulmonary arterial hypertension. PLAN: Lytic catheter infusion rates as described per order set. Vascular Interventional Radiology will continue to follow the patient with catheter removal in 12 hours. Michaelle Birks, MD [facility] Radiology Electronically Signed   By: Michaelle Birks M.D.   On: 08-23-2021 17:21   IR INFUSION THROMBOL ARTERIAL INITIAL (MS)  Result Date: August 23, 2021 INDICATION: Cardiac arrest 2/2 PE. Pulmonary HTN w R heart dysfunction EXAM: 1. ULTRASOUND GUIDANCE FOR VENOUS ACCESS X2 2. PULMONARY ARTERIOGRAPHY 3. FLUOROSCOPIC GUIDED PLACEMENT OF BILATERAL PULMONARY ARTERIAL LYTIC INFUSION CATHETERS COMPARISON:  None Available. MEDICATIONS: None ANESTHESIA/SEDATION: Moderate (conscious) sedation was employed during this procedure. A total of Versed 1 mg and Fentanyl 25 mcg was administered intravenously. Moderate Sedation Time: 79 minutes. The patient's level of consciousness and vital signs were monitored continuously by radiology nursing throughout the procedure under my direct supervision. CONTRAST:  22m OMNIPAQUE IOHEXOL 300 MG/ML  SOLN FLUOROSCOPY TIME:  Fluoroscopic dose; 1923mGy COMPLICATIONS: None immediate. TECHNIQUE: Informed written consent was obtained from the the patient and/or patient's representative after a discussion of the risks, benefits and alternatives to treatment. Questions regarding the procedure were encouraged and answered. A timeout was performed prior to the initiation of the procedure. Ultrasound scanning was performed of the RIGHT groin and demonstrated wide patency of the right common femoral vein. As such, the right common  femoral vein was selected venous access. The right groin was prepped and draped in the usual sterile fashion, and a sterile drape was applied covering the operative field. Maximum barrier sterile technique with sterile gowns and gloves were used for the procedure. A timeout was performed prior to the  initiation of the procedure. Local anesthesia was provided with 1% lidocaine. Under direct ultrasound guidance, the right common femoral vein was accessed with a micro puncture kit ultimately allowing placement of a 6 Fr 35 cm vascular sheath. Slightly cranial to this initial access, the right common femoral was again accessed with an additional micropuncture kit ultimately allowing placement of an additional 6 Fr 35 cm vascular sheath. Ultrasound images were saved for procedural documentation purposes. With the use of a Bentson wire, an angled pulmonary catheter was advanced into the main pulmonary artery and a limited central pulmonary arteriogram was performed. Pressure measurements were then obtained from the main pulmonary artery. A vertebral catheter was advanced into the distal branch of the left lower lobe pulmonary artery. Limited contrast injection confirmed appropriate positioning. Over an exchange length stiff Amplatz, the catheter was exchanged for a 90/5 cm multi side-hole infusion catheter. The procedure was then repeated on the patient's RIGHT pulmonary artery, and with the use of a stiff Amplatz, a vertebral catheter was advanced into a distal branch of the RIGHT lower lobe pulmonary artery. Limited contrast injection confirmed appropriate positioning. Over an exchange length stiff Amplatz, the catheter was exchanged for a 90/30 cm multi side-hole infusion catheter. A postprocedural fluoroscopic image was obtained to document final catheter positioning. The external catheter tubing was secured at the right thigh and the lytic therapy was initiated. The patient tolerated the procedure well without  immediate postprocedural complication. FINDINGS: Central pulmonary arteriogram demonstrates no central pulmonary embolus. Known bilateral pulmonary emboli with greatest burden in the RIGHT upper lobe. Acquired pressure measurements: Main  pulmonary artery-46/9; mean-22 (normal: < 25/10) Following the procedure, both ultrasound assisted infusion catheter tips terminate within the distal aspects of the bilateral lower lobe sub segmental pulmonary arteries. IMPRESSION: 1. Successful initiation of bilateral catheter directed pulmonary arterial lysis for submassive pulmonary embolism and right-sided heart strain. 2. Elevated pressure measurements within the main pulmonary artery compatible with critical pulmonary arterial hypertension. PLAN: Lytic catheter infusion rates as described per order set. Vascular Interventional Radiology will continue to follow the patient with catheter removal in 12 hours. Michaelle Birks, MD [facility] Radiology Electronically Signed   By: Michaelle Birks M.D.   On: 2021-08-28 17:21   IR INFUSION THROMBOL ARTERIAL INITIAL (MS)  Result Date: Aug 28, 2021 INDICATION: Cardiac arrest 2/2 PE. Pulmonary HTN w R heart dysfunction EXAM: 1. ULTRASOUND GUIDANCE FOR VENOUS ACCESS X2 2. PULMONARY ARTERIOGRAPHY 3. FLUOROSCOPIC GUIDED PLACEMENT OF BILATERAL PULMONARY ARTERIAL LYTIC INFUSION CATHETERS COMPARISON:  None Available. MEDICATIONS: None ANESTHESIA/SEDATION: Moderate (conscious) sedation was employed during this procedure. A total of Versed 1 mg and Fentanyl 25 mcg was administered intravenously. Moderate Sedation Time: 79 minutes. The patient's level of consciousness and vital signs were monitored continuously by radiology nursing throughout the procedure under my direct supervision. CONTRAST:  65m OMNIPAQUE IOHEXOL 300 MG/ML  SOLN FLUOROSCOPY TIME:  Fluoroscopic dose; 1662mGy COMPLICATIONS: None immediate. TECHNIQUE: Informed written consent was  obtained from the the patient and/or patient's representative after a discussion of the risks, benefits and alternatives to treatment. Questions regarding the procedure were encouraged and answered. A timeout was performed prior to the initiation of the procedure. Ultrasound scanning was performed of the RIGHT groin and demonstrated wide patency of the right common femoral vein. As such, the right common femoral vein was selected venous access. The right groin was prepped and draped in the usual sterile fashion, and a sterile drape was applied covering the operative field.  Maximum barrier sterile technique with sterile gowns and gloves were used for the procedure. A timeout was performed prior to the initiation of the procedure. Local anesthesia was provided with 1% lidocaine. Under direct ultrasound guidance, the right common femoral vein was accessed with a micro puncture kit ultimately allowing placement of a 6 Fr 35 cm vascular sheath. Slightly cranial to this initial access, the right common femoral was again accessed with an additional micropuncture kit ultimately allowing placement of an additional 6 Fr 35 cm vascular sheath. Ultrasound images were saved for procedural documentation purposes. With the use of a Bentson wire, an angled pulmonary catheter was advanced into the main pulmonary artery and a limited central pulmonary arteriogram was performed. Pressure measurements were then obtained from the main pulmonary artery. A vertebral catheter was advanced into the distal branch of the left lower lobe pulmonary artery. Limited contrast injection confirmed appropriate positioning. Over an exchange length stiff Amplatz, the catheter was exchanged for a 90/5 cm multi side-hole infusion catheter. The procedure was then repeated on the patient's RIGHT pulmonary artery, and with the use of a stiff Amplatz, a vertebral catheter was advanced into a distal branch of the RIGHT lower lobe pulmonary artery. Limited  contrast injection confirmed appropriate positioning. Over an exchange length stiff Amplatz, the catheter was exchanged for a 90/30 cm multi side-hole infusion catheter. A postprocedural fluoroscopic image was obtained to document final catheter positioning. The external catheter tubing was secured at the right thigh and the lytic therapy was initiated. The patient tolerated the procedure well without immediate postprocedural complication. FINDINGS: Central pulmonary arteriogram demonstrates no central pulmonary embolus. Known bilateral pulmonary emboli with greatest burden in the RIGHT upper lobe. Acquired pressure measurements: Main  pulmonary artery-46/9; mean-22 (normal: < 25/10) Following the procedure, both ultrasound assisted infusion catheter tips terminate within the distal aspects of the bilateral lower lobe sub segmental pulmonary arteries. IMPRESSION: 1. Successful initiation of bilateral catheter directed pulmonary arterial lysis for submassive pulmonary embolism and right-sided heart strain. 2. Elevated pressure measurements within the main pulmonary artery compatible with critical pulmonary arterial hypertension. PLAN: Lytic catheter infusion rates as described per order set. Vascular Interventional Radiology will continue to follow the patient with catheter removal in 12 hours. Michaelle Birks, MD [facility] Radiology Electronically Signed   By: Michaelle Birks M.D.   On: 08-15-21 17:21   IR US Guide Vasc Access Right  Result Date: 2021-08-15 INDICATION: Cardiac arrest 2/2 PE. Pulmonary HTN w R heart dysfunction EXAM: 1. ULTRASOUND GUIDANCE FOR VENOUS ACCESS X2 2. PULMONARY ARTERIOGRAPHY 3. FLUOROSCOPIC GUIDED PLACEMENT OF BILATERAL PULMONARY ARTERIAL LYTIC INFUSION CATHETERS COMPARISON:  None Available. MEDICATIONS: None ANESTHESIA/SEDATION: Moderate (conscious) sedation was employed during this procedure. A total of Versed 1 mg and Fentanyl 25 mcg was  administered intravenously. Moderate Sedation Time: 79 minutes. The patient's level of consciousness and vital signs were monitored continuously by radiology nursing throughout the procedure under my direct supervision. CONTRAST:  73m OMNIPAQUE IOHEXOL 300 MG/ML  SOLN FLUOROSCOPY TIME:  Fluoroscopic dose; 1956mGy COMPLICATIONS: None immediate. TECHNIQUE: Informed written consent was obtained from the the patient and/or patient's representative after a discussion of the risks, benefits and alternatives to treatment. Questions regarding the procedure were encouraged and answered. A timeout was performed prior to the initiation of the procedure. Ultrasound scanning was performed of the RIGHT groin and demonstrated wide patency of the right common femoral vein. As such, the right common femoral vein was selected venous access. The  right groin was prepped and draped in the usual sterile fashion, and a sterile drape was applied covering the operative field. Maximum barrier sterile technique with sterile gowns and gloves were used for the procedure. A timeout was performed prior to the initiation of the procedure. Local anesthesia was provided with 1% lidocaine. Under direct ultrasound guidance, the right common femoral vein was accessed with a micro puncture kit ultimately allowing placement of a 6 Fr 35 cm vascular sheath. Slightly cranial to this initial access, the right common femoral was again accessed with an additional micropuncture kit ultimately allowing placement of an additional 6 Fr 35 cm vascular sheath. Ultrasound images were saved for procedural documentation purposes. With the use of a Bentson wire, an angled pulmonary catheter was advanced into the main pulmonary artery and a limited central pulmonary arteriogram was performed. Pressure measurements were then obtained from the main pulmonary artery. A vertebral catheter was advanced into the distal branch of the left lower lobe pulmonary artery. Limited  contrast injection confirmed appropriate positioning. Over an exchange length stiff Amplatz, the catheter was exchanged for a 90/5 cm multi side-hole infusion catheter. The procedure was then repeated on the patient's RIGHT pulmonary artery, and with the use of a stiff Amplatz, a vertebral catheter was advanced into a distal branch of the RIGHT lower lobe pulmonary artery. Limited contrast injection confirmed appropriate positioning. Over an exchange length stiff Amplatz, the catheter was exchanged for a 90/30 cm multi side-hole infusion catheter. A postprocedural fluoroscopic image was obtained to document final catheter positioning. The external catheter tubing was secured at the right thigh and the lytic therapy was initiated. The patient tolerated the procedure well without immediate postprocedural complication. FINDINGS: Central pulmonary arteriogram demonstrates no central pulmonary embolus. Known bilateral pulmonary emboli with greatest burden in the RIGHT upper lobe. Acquired pressure measurements: Main  pulmonary artery-46/9; mean-22 (normal: < 25/10) Following the procedure, both ultrasound assisted infusion catheter tips terminate within the distal aspects of the bilateral lower lobe sub segmental pulmonary arteries. IMPRESSION: 1. Successful initiation of bilateral catheter directed pulmonary arterial lysis for submassive pulmonary embolism and right-sided heart strain. 2. Elevated pressure measurements within the main pulmonary artery compatible with critical pulmonary arterial hypertension. PLAN: Lytic catheter infusion rates as described per order set. Vascular Interventional Radiology will continue to follow the patient with catheter removal in 12 hours. Michaelle Birks, MD [facility] Inter[facility]iology [facility] Radiology Electronically Signed   By: Michaelle Birks M.D.   On: 08-28-2021 17:21   IR US Guide Vasc Access Right  Result Date: 08-28-21 INDICATION: Cardiac arrest 2/2 PE.  Pulmonary HTN w R heart dysfunction EXAM: 1. ULTRASOUND GUIDANCE FOR VENOUS ACCESS X2 2. PULMONARY ARTERIOGRAPHY 3. FLUOROSCOPIC GUIDED PLACEMENT OF BILATERAL PULMONARY ARTERIAL LYTIC INFUSION CATHETERS COMPARISON:  None Available. MEDICATIONS: None ANESTHESIA/SEDATION: Moderate (conscious) sedation was employed during this procedure. A total of Versed 1 mg and Fentanyl 25 mcg was administered intravenously. Moderate Sedation Time: 79 minutes. The patient's level of consciousness and vital signs were monitored continuously by radiology nursing throughout the procedure under my direct supervision. CONTRAST:  2m OMNIPAQUE IOHEXOL 300 MG/ML  SOLN FLUOROSCOPY TIME:  Fluoroscopic dose; 1400mGy COMPLICATIONS: None immediate. TECHNIQUE: Informed written consent was obtained from the the patient and/or patient's representative after a discussion of the risks, benefits and alternatives to treatment. Questions regarding the procedure were encouraged and answered. A timeout was performed prior to the initiation of the procedure. Ultrasound scanning was performed of the RIGHT groin and  demonstrated wide patency of the right common femoral vein. As such, the right common femoral vein was selected venous access. The right groin was prepped and draped in the usual sterile fashion, and a sterile drape was applied covering the operative field. Maximum barrier sterile technique with sterile gowns and gloves were used for the procedure. A timeout was performed prior to the initiation of the procedure. Local anesthesia was provided with 1% lidocaine. Under direct ultrasound guidance, the right common femoral vein was accessed with a micro puncture kit ultimately allowing placement of a 6 Fr 35 cm vascular sheath. Slightly cranial to this initial access, the right common femoral was again accessed with an additional micropuncture kit ultimately allowing placement of an additional 6 Fr 35 cm vascular sheath. Ultrasound images were  saved for procedural documentation purposes. With the use of a Bentson wire, an angled pulmonary catheter was advanced into the main pulmonary artery and a limited central pulmonary arteriogram was performed. Pressure measurements were then obtained from the main pulmonary artery. A vertebral catheter was advanced into the distal branch of the left lower lobe pulmonary artery. Limited contrast injection confirmed appropriate positioning. Over an exchange length stiff Amplatz, the catheter was exchanged for a 90/5 cm multi side-hole infusion catheter. The procedure was then repeated on the patient's RIGHT pulmonary artery, and with the use of a stiff Amplatz, a vertebral catheter was advanced into a distal branch of the RIGHT lower lobe pulmonary artery. Limited contrast injection confirmed appropriate positioning. Over an exchange length stiff Amplatz, the catheter was exchanged for a 90/30 cm multi side-hole infusion catheter. A postprocedural fluoroscopic image was obtained to document final catheter positioning. The external catheter tubing was secured at the right thigh and the lytic therapy was initiated. The patient tolerated the procedure well without immediate postprocedural complication. FINDINGS: Central pulmonary arteriogram demonstrates no central pulmonary embolus. Known bilateral pulmonary emboli with greatest burden in the RIGHT upper lobe. Acquired pressure measurements: Main  pulmonary artery-46/9; mean-22 (normal: < 25/10) Following the procedure, both ultrasound assisted infusion catheter tips terminate within the distal aspects of the bilateral lower lobe sub segmental pulmonary arteries. IMPRESSION: 1. Successful initiation of bilateral catheter directed pulmonary arterial lysis for submassive pulmonary embolism and right-sided heart strain. 2. Elevated pressure measurements within the main pulmonary artery compatible with critical pulmonary arterial hypertension. PLAN: Lytic catheter infusion  rates as described per order set. Vascular Interventional Radiology will continue to follow the patient with catheter removal in 12 hours. Michaelle Birks, MD [facility] Inter[facility]iology Special[facility] Radiology Electronically Signed   By: Michaelle Birks M.D.   On: Aug 27, 2021 17:21   IR Angiogram Selective Each Additional Vessel  Result Date: 2021-08-27 INDICATION: Cardiac arrest 2/2 PE. Pulmonary HTN w R heart dysfunction EXAM: 1. ULTRASOUND GUIDANCE FOR VENOUS ACCESS X2 2. PULMONARY ARTERIOGRAPHY 3. FLUOROSCOPIC GUIDED PLACEMENT OF BILATERAL PULMONARY ARTERIAL LYTIC INFUSION CATHETERS COMPARISON:  None Available. MEDICATIONS: None ANESTHESIA/SEDATION: Moderate (conscious) sedation was employed during this procedure. A total of Versed 1 mg and Fentanyl 25 mcg was administered intravenously. Moderate Sedation Time: 79 minutes. The patient's level of consciousness and vital signs were monitored continuously by radiology nursing throughout the procedure under my direct supervision. CONTRAST:  8m OMNIPAQUE IOHEXOL 300 MG/ML  SOLN FLUOROSCOPY TIME:  Fluoroscopic dose; 1845mGy COMPLICATIONS: None immediate. TECHNIQUE: Informed written consent was obtained from the the patient and/or patient's representative after a discussion of the risks, benefits and alternatives to treatment. Questions regarding the procedure were encouraged and  answered. A timeout was performed prior to the initiation of the procedure. Ultrasound scanning was performed of the RIGHT groin and demonstrated wide patency of the right common femoral vein. As such, the right common femoral vein was selected venous access. The right groin was prepped and draped in the usual sterile fashion, and a sterile drape was applied covering the operative field. Maximum barrier sterile technique with sterile gowns and gloves were used for the procedure. A timeout was performed prior to the initiation of the procedure. Local anesthesia was provided  with 1% lidocaine. Under direct ultrasound guidance, the right common femoral vein was accessed with a micro puncture kit ultimately allowing placement of a 6 Fr 35 cm vascular sheath. Slightly cranial to this initial access, the right common femoral was again accessed with an additional micropuncture kit ultimately allowing placement of an additional 6 Fr 35 cm vascular sheath. Ultrasound images were saved for procedural documentation purposes. With the use of a Bentson wire, an angled pulmonary catheter was advanced into the main pulmonary artery and a limited central pulmonary arteriogram was performed. Pressure measurements were then obtained from the main pulmonary artery. A vertebral catheter was advanced into the distal branch of the left lower lobe pulmonary artery. Limited contrast injection confirmed appropriate positioning. Over an exchange length stiff Amplatz, the catheter was exchanged for a 90/5 cm multi side-hole infusion catheter. The procedure was then repeated on the patient's RIGHT pulmonary artery, and with the use of a stiff Amplatz, a vertebral catheter was advanced into a distal branch of the RIGHT lower lobe pulmonary artery. Limited contrast injection confirmed appropriate positioning. Over an exchange length stiff Amplatz, the catheter was exchanged for a 90/30 cm multi side-hole infusion catheter. A postprocedural fluoroscopic image was obtained to document final catheter positioning. The external catheter tubing was secured at the right thigh and the lytic therapy was initiated. The patient tolerated the procedure well without immediate postprocedural complication. FINDINGS: Central pulmonary arteriogram demonstrates no central pulmonary embolus. Known bilateral pulmonary emboli with greatest burden in the RIGHT upper lobe. Acquired pressure measurements: Main  pulmonary artery-46/9; mean-22 (normal: < 25/10) Following the procedure, both ultrasound assisted infusion catheter tips  terminate within the distal aspects of the bilateral lower lobe sub segmental pulmonary arteries. IMPRESSION: 1. Successful initiation of bilateral catheter directed pulmonary arterial lysis for submassive pulmonary embolism and right-sided heart strain. 2. Elevated pressure measurements within the main pulmonary artery compatible with critical pulmonary arterial hypertension. PLAN: Lytic catheter infusion rates as described per order set. Vascular Interventional Radiology will continue to follow the patient with catheter removal in 12 hours. Michaelle Birks, MD [facility] Radiology Electronically Signed   By: Michaelle Birks M.D.   On: 2021/08/22 17:21   IR Angiogram Selective Each Additional Vessel  Result Date: 08/22/21 INDICATION: Cardiac arrest 2/2 PE. Pulmonary HTN w R heart dysfunction EXAM: 1. ULTRASOUND GUIDANCE FOR VENOUS ACCESS X2 2. PULMONARY ARTERIOGRAPHY 3. FLUOROSCOPIC GUIDED PLACEMENT OF BILATERAL PULMONARY ARTERIAL LYTIC INFUSION CATHETERS COMPARISON:  None Available. MEDICATIONS: None ANESTHESIA/SEDATION: Moderate (conscious) sedation was employed during this procedure. A total of Versed 1 mg and Fentanyl 25 mcg was administered intravenously. Moderate Sedation Time: 79 minutes. The patient's level of consciousness and vital signs were monitored continuously by radiology nursing throughout the procedure under my direct supervision. CONTRAST:  36m OMNIPAQUE IOHEXOL 300 MG/ML  SOLN FLUOROSCOPY TIME:  Fluoroscopic dose; 1161mGy COMPLICATIONS: None immediate. TECHNIQUE: Informed written consent was obtained from the the patient  and/or patient's representative after a discussion of the risks, benefits and alternatives to treatment. Questions regarding the procedure were encouraged and answered. A timeout was performed prior to the initiation of the procedure. Ultrasound scanning was performed of the RIGHT groin and demonstrated wide patency of the right  common femoral vein. As such, the right common femoral vein was selected venous access. The right groin was prepped and draped in the usual sterile fashion, and a sterile drape was applied covering the operative field. Maximum barrier sterile technique with sterile gowns and gloves were used for the procedure. A timeout was performed prior to the initiation of the procedure. Local anesthesia was provided with 1% lidocaine. Under direct ultrasound guidance, the right common femoral vein was accessed with a micro puncture kit ultimately allowing placement of a 6 Fr 35 cm vascular sheath. Slightly cranial to this initial access, the right common femoral was again accessed with an additional micropuncture kit ultimately allowing placement of an additional 6 Fr 35 cm vascular sheath. Ultrasound images were saved for procedural documentation purposes. With the use of a Bentson wire, an angled pulmonary catheter was advanced into the main pulmonary artery and a limited central pulmonary arteriogram was performed. Pressure measurements were then obtained from the main pulmonary artery. A vertebral catheter was advanced into the distal branch of the left lower lobe pulmonary artery. Limited contrast injection confirmed appropriate positioning. Over an exchange length stiff Amplatz, the catheter was exchanged for a 90/5 cm multi side-hole infusion catheter. The procedure was then repeated on the patient's RIGHT pulmonary artery, and with the use of a stiff Amplatz, a vertebral catheter was advanced into a distal branch of the RIGHT lower lobe pulmonary artery. Limited contrast injection confirmed appropriate positioning. Over an exchange length stiff Amplatz, the catheter was exchanged for a 90/30 cm multi side-hole infusion catheter. A postprocedural fluoroscopic image was obtained to document final catheter positioning. The external catheter tubing was secured at the right thigh and the lytic therapy was initiated. The  patient tolerated the procedure well without immediate postprocedural complication. FINDINGS: Central pulmonary arteriogram demonstrates no central pulmonary embolus. Known bilateral pulmonary emboli with greatest burden in the RIGHT upper lobe. Acquired pressure measurements: Main  pulmonary artery-46/9; mean-22 (normal: < 25/10) Following the procedure, both ultrasound assisted infusion catheter tips terminate within the distal aspects of the bilateral lower lobe sub segmental pulmonary arteries. IMPRESSION: 1. Successful initiation of bilateral catheter directed pulmonary arterial lysis for submassive pulmonary embolism and right-sided heart strain. 2. Elevated pressure measurements within the main pulmonary artery compatible with critical pulmonary arterial hypertension. PLAN: Lytic catheter infusion rates as described per order set. Vascular Interventional Radiology will continue to follow the patient with catheter removal in 12 hours. Michaelle Birks, MD [facility] Radiology Electronically Signed   By: Michaelle Birks M.D.   On: Aug 14, 2021 17:21   IR THROMB F/U EVAL ART/VEN FINAL DAY (MS)  Result Date: 08/14/21 CLINICAL DATA:  88 year old gentleman with history of atrial fibrillation, diabetes, hypertension, recent stroke develops sudden onset of shortness of breath leading to cardiac arrest on 08/07/2021. ROSC was achieved after 10 mintes of CPR. CT angiography of the chest demonstrated significant pulmonary artery embolism. Interventional radiology was consulted for PE thrombolysis. Pulmonary angiogram and catheter directed thrombolysis was initiated at approximately 1830 hours on 08/15/2021. Patient return to ICU after the procedure. Unfortunately the patient's condition deteriorated overnight and he coded again at approximately 6 a.m. The lysis catheters were removed at  approximately 9 a.m. The right groin sheath were left in place so they may be used for  blood draws are medication administration. Electronically Signed   By: Miachel Roux M.D.   On: 2021/08/09 14:46   DG CHEST PORT 1 VIEW  Result Date: 08-09-21 CLINICAL DATA:  Provided history: Tachycardia. EXAM: PORTABLE CHEST 1 VIEW COMPARISON:  CT angiogram chest 08/01/2021. FINDINGS: ET tube present with tip projecting at the level of the clavicular heads. Bilateral pulmonary artery thrombolysis catheters. A loop recorder device and cardiac monitoring pads overlie the chest. Heart size within normal limits. Aortic atherosclerosis. Bilateral pleural thickening and pleural calcifications, better appreciated on the chest CT of 07/26/2021. Ill-defined opacity within the left upper lobe. No evidence of pleural effusion or pneumothorax. No acute bony abnormality identified. IMPRESSION: 1. ET tube present with tip projecting at the level of the clavicular heads. 2. Bilateral pulmonary artery thrombolysis catheters. 3. Ill-defined opacity within the left upper lobe, which may reflect atelectasis, edema, pneumonia or developing infarct. 4. 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 5. Aortic Atherosclerosis (ICD10-I70.0). Electronically Signed   By: Kellie Simmering D.O.   On: Aug 09, 2021 08:00   CT Angio Chest PE W and/or Wo Contrast  Result Date: 08/16/2021 CLINICAL DATA:  A male at age 42 presents for evaluation of pulmonary embolism. * Tracking Code: BO * EXAM: CT ANGIOGRAPHY CHEST WITH CONTRAST TECHNIQUE: Multidetector CT imaging of the chest was performed using the standard protocol during bolus administration of intravenous contrast. Multiplanar CT image reconstructions and MIPs were obtained to evaluate the vascular anatomy. RADIATION DOSE REDUCTION: This exam was performed according to the departmental dose-optimization program which includes automated exposure control, adjustment of the mA and/or kV according to patient size and/or use of iterative reconstruction  technique. CONTRAST:  144m OMNIPAQUE IOHEXOL 350 MG/ML SOLN COMPARISON:  October 16, 2020. FINDINGS: Cardiovascular: The study is positive for pulmonary embolism in the main pulmonary artery on the RIGHT which tracks into RIGHT upper lobe pulmonary artery with occlusive features and RIGHT lower lobe and middle lobe pulmonary artery with partially occlusive features in the lower lobe. A thin band of thrombus joins areas of thrombi in the LEFT upper lobe which are nonocclusive. There are occlusive emboli in LEFT lower lobe segmental branches. There are signs of RIGHT heart strain with an RV to LV ratio proximally 1.75. There signs of LVH as well. Aortic atherosclerosis both calcified and noncalcified atheromatous plaque within a nonaneurysmal thoracic aorta. Heart size is normal with small pericardial effusion. Subtle nodularity along the pericardium (image 95/6) This measures approximately 6 mm and was previously 2-3 mm. Mediastinum/Nodes: Esophagus is unremarkable. No signs of adenopathy in the chest. Lungs/Pleura: Extensive pleural thickening and pleural calcification with increased thickness of soft tissue associated with pleural calcification particularly in the LEFT anterior upper lobe (image 238/7 and 241/7, soft tissue measuring up to 9 mm greatest thickness previously approximately 3 mm and extending along approximately 21 mm of the pleural surface. Other areas of soft tissue nodularity along previously described asbestos related pleural disease with similar increasing conspicuity though obscured partially by atelectatic changes in the LEFT lung base. Similar appearance of asbestos related pleural disease with some areas of soft tissue nodularity in the RIGHT chest. No pleural effusion. No pneumothorax. No dense consolidative process. Airways are patent. Upper Abdomen: Incidental imaging of upper abdominal contents with out acute findings to the extent evaluated. Adrenal glands are normal to the extent  visualized. Musculoskeletal: Osteopenia  and spinal degenerative changes. Degenerative changes in the bilateral glenohumeral joints. No acute or destructive bone process. Review of the MIP images confirms the above findings. IMPRESSION: 1. Positive for acute PE in the RIGHT main pulmonary artery and lobar level in the RIGHT chest and at the multifocal segmental level in the LEFT chest with CT evidence of right heart strain (RV/LV Ratio = 1.75) consistent with at least submassive (intermediate risk) PE. The presence of right heart strain has been associated with an increased risk of morbidity and mortality. Please refer to the "Code PE Focused" order set in EPIC. 2. Worsening of nodularity associated with ex best is related pleural disease and nodularity along the LEFT heart border raising the question of early changes of malignant mesothelioma. 3. Diffuse heterogeneity of marrow spaces in the spine is of uncertain significance. This could represent pattern of osteopenia or marrow reconversion. If the patient does have a history of neoplasm the possibility of metastatic disease is not excluded. Aortic Atherosclerosis (ICD10-I70.0). Critical Value/emergent results were called by telephone at the time of interpretation on 08/10/2021 at 1:50 to provider Godfrey Pick , who verbally acknowledged these results. Electronically Signed   By: Zetta Bills M.D.   On: 08/03/2021 14:03   CT HEAD WO CONTRAST  Result Date: 08/11/2021 CLINICAL DATA:  Altered mental status EXAM: CT HEAD WITHOUT CONTRAST TECHNIQUE: Contiguous axial images were obtained from the base of the skull through the vertex without intravenous contrast. RADIATION DOSE REDUCTION: This exam was performed according to the departmental dose-optimization program which includes automated exposure control, adjustment of the mA and/or kV according to patient size and/or use of iterative reconstruction technique. COMPARISON:  CT head 06/03/2021 FINDINGS: Brain: No acute  intracranial hemorrhage, mass effect, or herniation. No extra-axial fluid collections. No evidence of acute territorial infarct. No hydrocephalus. Mild-to-moderate cortical volume loss. Small area of encephalomalacia in the right frontal lobe, likely secondary to old infarct. Old lacunar infarct in the left pons. Patchy hypodensities throughout the periventricular and subcortical white matter, likely secondary to chronic microvascular ischemic changes. Vascular: Extensive calcified plaques in the carotid siphons. Skull: Normal. Negative for fracture or focal lesion. Sinuses/Orbits: No acute finding. Other: None. IMPRESSION: Chronic changes as described with no acute intracranial process identified. Electronically Signed   By: Ofilia Neas M.D.   On: 07/26/2021 13:47   DG Chest Portable 1 View  Result Date: 08/13/2021 CLINICAL DATA:  Chest pain. EXAM: PORTABLE CHEST 1 VIEW COMPARISON:  06/02/2021 FINDINGS: Loop recorder projects over the left heart border. Stable cardiomediastinal contours. Aortic atherosclerotic calcifications. Bilateral pleural calcifications identified. No pleural effusion, airspace consolidation or signs of pneumothorax. The visualized osseous structures are unremarkable. IMPRESSION: No acute abnormality. 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 1.  Aortic Atherosclerosis (ICD10-I70.0). Electronically Signed   By: Kerby Moors M.D.   On: 08/10/2021 11:59    Microbiology Recent Results (from the past 240 hour(s))  Resp Panel by RT-PCR (Flu A&B, Covid) Anterior Nasal Swab     Status: None   Collection Time: 08/03/2021 11:55 AM   Specimen: Anterior Nasal Swab  Result Value Ref Range Status   SARS Coronavirus 2 by RT PCR NEGATIVE NEGATIVE Final    Comment: (NOTE) SARS-CoV-2 target nucleic acids are NOT DETECTED.  The SARS-CoV-2 RNA is generally detectable in upper respiratory specimens during the acute phase of infection.  The lowest concentration of SARS-CoV-2 viral copies this assay can detect is 138 copies/mL. A negative result does not  preclude SARS-Cov-2 infection and should not be used as the sole basis for treatment or other patient management decisions. A negative result may occur with  improper specimen collection/handling, submission of specimen other than nasopharyngeal swab, presence of viral mutation(s) within the areas targeted by this assay, and inadequate number of viral copies(<138 copies/mL). A negative result must be combined with clinical observations, patient history, and epidemiological information. The expected result is Negative.  Fact Sheet for Patients:  EntrepreneurPulse.com.au  Fact Sheet for Healthcare Providers:  IncredibleEmployment.be  This test is no t yet approved or cleared by the [place] FDA and  has been authorized for detection and/or diagnosis of SARS-CoV-2 by FDA under an Emergency Use Authorization (EUA). This EUA will remain  in effect (meaning this test can be used) for the duration of the COVID-19 declaration under Section 564(b)(1) of the Act, 21 U.S.C.section 360bbb-3(b)(1), unless the authorization is terminated  or revoked sooner.       Influenza A by PCR NEGATIVE NEGATIVE Final   Influenza B by PCR NEGATIVE NEGATIVE Final    Comment: (NOTE) The Xpert Xpress SARS-CoV-2/FLU/RSV plus assay is intended as an aid in the diagnosis of influenza from Nasopharyngeal swab specimens and should not be used as a sole basis for treatment. Nasal washings and aspirates are unacceptable for Xpert Xpress SARS-CoV-2/FLU/RSV testing.  Fact Sheet for Patients: EntrepreneurPulse.com.au  Fact Sheet for Healthcare Providers: IncredibleEmployment.be  This test is not yet approved or cleared by the [place] FDA and has been authorized for detection and/or diagnosis of SARS-CoV-2 by FDA  under an Emergency Use Authorization (EUA). This EUA will remain in effect (meaning this test can be used) for the duration of the COVID-19 declaration under Section 564(b)(1) of the Act, 21 U.S.C. section 360bbb-3(b)(1), unless the authorization is terminated or revoked.  Performed at [facility], [place] [address], [place], [place] [address]   MRSA Next Gen by PCR, Nasal     Status: None   Collection Time: 08/04/2021  7:42 PM   Specimen: Nasal Mucosa; Nasal Swab  Result Value Ref Range Status   MRSA by PCR Next Gen NOT DETECTED NOT DETECTED Final    Comment: (NOTE) The GeneXpert MRSA Assay (FDA approved for NASAL specimens only), is one component of a comprehensive MRSA colonization surveillance program. It is not intended to diagnose MRSA infection nor to guide or monitor treatment for MRSA infections. Test performance is not FDA approved in patients less than 67 years old. Performed at [facility], [place] [address], [place], [place] [address]     Lab Basic Metabolic Panel: Recent Labs  Lab 08/03/2021 1154 07/27/2021 1204 2021-07-31 0603 07-31-21 0640  NA 136 136 139 149*  K 3.6 3.3* 4.2 4.5  CL 106  --  112* 114*  CO2 18*  --  13* 18*  GLUCOSE 187*  --  172* 153*  BUN 25*  --  24* 23  CREATININE 1.12  --  1.00 1.13  CALCIUM 9.1  --  8.7* 9.3  MG 2.2  --  2.2  --   PHOS  --   --  3.8  --    Liver Function Tests: Recent Labs  Lab 08/16/2021 1154 07-31-21 0640  AST 100* 695*  ALT 88* 738*  ALKPHOS 49 33*  BILITOT 0.7 0.8  PROT 5.9* <3.0*  ALBUMIN 3.1* 1.5*   No results for input(s): "LIPASE", "AMYLASE" in the last 168 hours. No results for input(s): "AMMONIA" in the last  168 hours. CBC: Recent Labs  Lab 08/11/2021 1154 07/26/2021 1204 01-Aug-2021 0654  WBC 12.3*  --  13.1*  HGB 12.3* 11.6* 6.8*  HCT 36.4* 34.0* 21.5*  MCV 97.1  --  100.9*  PLT 294  --  185   Cardiac Enzymes: No results for input(s): "CKTOTAL", "CKMB", "CKMBINDEX", "TROPONINI" in the  last 168 hours. Sepsis Labs: Recent Labs  Lab 08/07/2021 1154 08/01/21 0640 Aug 01, 2021 0654  WBC 12.3*  --  13.1*  LATICACIDVEN 3.2* >9.0*  --     Procedures/Operations  Endotracheal Intubation Cardiopulmonary Resuscitation Catheter Directed Thrombolysis   Freddi Starr Aug 01, 2021, 5:46 PM

## 2021-08-25 NOTE — Consult Note (Signed)
[place] Nurse Consult Note: Reason for Consult: Consult requested for bilat heels.  Pt is critically ill and has recently coded 2 times; bedside nurse requested I do not move the patient to assess his BLE.  Family is discussing possible comfort care options. According to the nursing flowsheet, patient has a Stage 3 pressure injury; 1X1cm to right heel and Stage 2 pressure injury to left heel; 1X.5cm Pressure Injury POA: Yes Dressing procedure/placement/frequency: Topical treatment options provided for bedside nurses to perform as follows: Float heels to reduce pressure. Foam dressing to bilat heels, change Q 3 days or PRN soiling. Please re-consult if further assistance is needed.  Thank-you,  Julien Girt MSN, [place], [place], [place], [place]

## 2021-08-25 NOTE — Progress Notes (Addendum)
PCCM INTERVAL PROGRESS NOTE  Called to bedside for patient withy hypotension by EMD who started pressors. Upon my arrival the patient is unresponsive. Emergently intubated. Lost pulses. CPR initiated. See code sheet for details.  ROSC after ~ 10 mins  Added epi drip to phenylephrine infusion.  CT head if stable enough to r/o bleed Had to stop TPA infusion to use sheath for vasopressors. PIVs failing.  Dr. Ander Slade discussed code status with family addressed in separate note. They are on their way in.   Care handed off to MICU day team.    Georgann Housekeeper, AGACNP-BC [place]  See Amion for personal pager PCCM on call pager [phone] until 7pm. Please call Elink 7p-7a. [phone]  24-Aug-2021 7:02 AM

## 2021-08-25 NOTE — Progress Notes (Addendum)
eLink Physician-Brief Progress Note Patient Name: Eddy Liszewski DOB: 11/24/1931 MRN: [id number]   Date of Service  08-27-2021  HPI/Events of Note  Hypotension and Tachypnea - BP = 83/48, HR = 117 and RR = 33 in setting of PE treated with catheter directed tPA and Heparin IV infusion. Difficulty earlier in getting ordered labs d/t not being able to draw blood from PIVs and not being able to stick the patient d/t tPA.   eICU Interventions  Plan: Phenylephrine IV infusion via PIV. Titrate to MAP >= 65. 0.9 NaCl 500 mL IV over 30 minutes now. Portable CXR STAT. ABG STAT.  Please send labs now. Will request that PCCM ground team evaluate the patient at bedside.      Intervention Category Major Interventions: Hypotension - evaluation and management  Izora Benn [place] 08/27/2021, 6:05 AM

## 2021-08-25 NOTE — Progress Notes (Signed)
eLink Physician-Brief Progress Note Patient Name: Alan Rubio DOB: 12-Nov-1931 MRN: [id number]   Date of Service  08-25-21  HPI/Events of Note  Still having chest pain d/t CPR. Patient is NPO. Creatinine = 1.12.  eICU Interventions  Plan: Toradol 15 mg IV x 1 now.      Intervention Category Major Interventions: Other:  Keaghan Bowens Cornelia Copa August 25, 2021, 1:02 AM

## 2021-08-25 NOTE — Progress Notes (Signed)
Patients name and DOB were confirmed. Both catheters were removed, sheaths left in place per MD. Pressures were not obtained due to patients status.

## 2021-08-25 NOTE — Progress Notes (Signed)
Spoke with patient's son on the phone  Patient actively receiving CPR  Did achieve ROSC  Continue resuscitative measures No heroic measures to keep him alive  Patient's son states he will pick up patient's spouse and there will be on their way into the hospital  Can be reached on the same number

## 2021-08-25 NOTE — Progress Notes (Signed)
Patient with increasing agitation and c/o chest pain through the night due to CPR provided prior to admission. ~ 0605 RR 40s, HR 130s, BP 83/48 (56). Pressors started per eLink MD. While in room, patient lost pulse. Code blue called. CPR initiated. See other notes and code sheet.   Antonieta Pert, RN

## 2021-08-25 NOTE — Progress Notes (Signed)
NAMEWymon Rubio, MRN:  [id number], DOB:  Aug 07, 1931, LOS: 1 ADMISSION DATE:  07/28/2021, CONSULTATION DATE:  07/26/2021 REFERRING MD:  Doren Custard, CHIEF COMPLAINT:  out of hospital cardiac arrest  History of Present Illness:  Alan Rubio, is a 88 y.o. male, who presented to the [facility] ED with a chief complaint of out of hospital cardiac arrest  They have a pertinent past medical history of A-fib, DM 2, HLD, HTN, hypothyroidism, stroke (05/22/2021)  Prior to arrival, patient became short of breath on 7/5 while working with PT at rehab.  Shortly after patient found pulseless and apneic by staff.  AED defibrillated x1.  CPR 10 minutes.  99% nonrebreather  ED course was notable for CT chest concerning for acute PE in the right main pulmonary artery and lobar level in the right chest and multifocal segment level of left chest with RV strain: RV/LV ratio 1.75.  The patient was deemed not to be a candidate for tPA due to recent stroke.  IR was consulted.  PCCM was consulted for admission  Pertinent  Medical History  A-fib, DM 2, HLD, HTN, hypothyroidism, stroke (05/22/2021)  Significant Hospital Events: Including procedures, antibiotic start and stop dates in addition to other pertinent events   7/5 admit and received TPA for massive PE 7/6 code at 6am achieved ROSC after 10 minutes  Interim History / Subjective:   Pt had overnight episodes of hypotension, tachycardia and tachypnea. Eventually lost pulses and coded at 6am. Was able to achieve ROSC after 10 minutes of ACLS, and received 2 boluses of Amiodarone for VT. Was seen to have small amount of blood in stool. Pt was mildly responsive to very loud voice after code event. Continued to be hypotensive despite continually increasing pressor support. Discussed with family at bedside our significant concern that he may not survive this hospitalization.  Objective   Blood pressure 117/67, pulse (!) 114, temperature (!) 96 F (35.6 C), temperature source  Axillary, resp. rate (!) 29, weight 73.4 kg, SpO2 (!) 79 %.    Vent Mode: PRVC FiO2 (%):  [60 %-100 %] 60 % Set Rate:  [26 bmp] 26 bmp Vt Set:  [620 mL] 620 mL PEEP:  [8 cmH20] 8 cmH20 Plateau Pressure:  [25 cmH20] 25 cmH20   Intake/Output Summary (Last 24 hours) at 08-02-21 0820 Last data filed at 08/02/21 0815 Gross per 24 hour  Intake 744.37 ml  Output 200 ml  Net 544.37 ml   Filed Weights   07/28/2021 1950 08-02-21 0500  Weight: 73.4 kg 73.4 kg    Examination: General: In bed, eyes closed and appears mildly uncomfortable HEENT: MM pink/moist, anicteric, atraumatic Neuro: moves all extremities spontaneously. Eyes open to loud voice. Not following commands. CV: RRR no murmurs, rubs or gallops PULM:  clear in the upper lobes, clear in the lower lobes, trachea midline, chest expansion symmetric  GI: soft, bsx4 active, non-tender    Extremities: warm/dry, no pretibial edema, capillary refill less than 3 seconds  Skin: Wound to right foot bandaged and wrapped, left foot wound, no rashes or lesions noted  Labs/imaging 7/5 at 1201p ABG 7.45/26/111/18 0.4 7/5 CTA head: No acute abnormalities 7/5 CTA chest: Acute PE in the right main pulmonary artery and lobar level in the right chest and multifocal segment level of left chest with RV strain: RV/LV ratio 1.75. Worsening of nodularity associated with a is related pleural disease and nodularity along the LEFT heart border.   Resolved Hospital Problem list  Assessment & Plan:  Submassive pulmonary embolism Out of hospital cardiac arrest, secondary to PE  In hospital cardiac arrest, secondary to hemorrhage?  Acute respiratory failure with hypoxia. Acute PE in the right main pulmonary artery and lobar level in the right chest and multifocal segment level of left chest with RV strain: RV/LV ratio 1.75.  Patient presented status post cardiac arrest with 1 shock.  Patient returned to neurologic baseline. Pt did receive systemic tPA  for 11 hours until this was discontinued during the 6am code on 7/6. Saw Hgb trend from 12.3 on 7/5 admission to 6.8 at 7am on 7/6; concerning for hemorrhage in setting of lytics and hypotension. Pt is very tenuous. Advancing pressors to max for goal of MAP>65. Discussing actively with family at bedside, who note that they are choosing to make Alan Rubio DNR, agreeing this is what he would want for himself. Discussed high concern that we will again lose pulses and family understands. -Goal MAP 65 or greater. On Norepi, Epi, and Phenyl.  -Trend lactate and ABGs -Trend Hgb -Goal SPO2 92-98 percent. On PRVC. -Defer normothermia protocol due to patient returning to neurologic baseline  HX CVA/left pontine infarct (05/22/2021) -Resume aspirin/statin/plavix when able  Lactic acidosis Troponin elevation Transaminitis Secondary to PE, hypotension and type II MI. -Trend  HTN HLD In NSR on exam -Holding home antihypertensives  DM2 -Blood Glucose goal 140-180. -SSI  Hypothyroidism -resume home synthroid  Leukocytosis Suspect reactive -trend  Best Practice (right click and "Reselect all SmartList Selections" daily)   Diet/type: NPO w/ oral meds DVT prophylaxis: SCD GI prophylaxis: PPI Lines: N/A Foley:  N/A Code Status:  full code Last date of multidisciplinary goals of care discussion [pending]  Labs   CBC: Recent Labs  Lab 08/09/2021 1154 08/08/2021 1204 2021/08/07 0654  WBC 12.3*  --  13.1*  HGB 12.3* 11.6* 6.8*  HCT 36.4* 34.0* 21.5*  MCV 97.1  --  100.9*  PLT 294  --  185     Basic Metabolic Panel: Recent Labs  Lab 08/22/2021 1154 08/08/2021 1204 August 07, 2021 0603 08-07-2021 0640  NA 136 136 139 149*  K 3.6 3.3* 4.2 4.5  CL 106  --  112* 114*  CO2 18*  --  13* 18*  GLUCOSE 187*  --  172* 153*  BUN 25*  --  24* 23  CREATININE 1.12  --  1.00 1.13  CALCIUM 9.1  --  8.7* 9.3  MG 2.2  --  2.2  --   PHOS  --   --  3.8  --     GFR: Estimated Creatinine Clearance: 46.9  mL/min (by C-G formula based on SCr of 1.13 mg/dL). Recent Labs  Lab 08/11/2021 1154 08/07/21 0640 08-07-2021 0654  WBC 12.3*  --  13.1*  LATICACIDVEN 3.2* >9.0*  --      Liver Function Tests: Recent Labs  Lab 08/15/2021 1154 2021-08-07 0640  AST 100* 695*  ALT 88* 738*  ALKPHOS 49 33*  BILITOT 0.7 0.8  PROT 5.9* <3.0*  ALBUMIN 3.1* 1.5*    No results for input(s): "LIPASE", "AMYLASE" in the last 168 hours. No results for input(s): "AMMONIA" in the last 168 hours.  ABG    Component Value Date/Time   PHART 7.451 (H) 08/11/2021 1204   PCO2ART 26.1 (L) 08/07/2021 1204   PO2ART 111 (H) 08/20/2021 1204   HCO3 18.4 (L) 08/22/2021 1204   TCO2 19 (L) 08/12/2021 1204   ACIDBASEDEF 5.0 (H) 08/20/2021 1204   O2SAT 99  07/28/2021 1204     Coagulation Profile: Recent Labs  Lab 08/06/2021 1154 15-Aug-2021 0640  INR 1.2 1.8*     Cardiac Enzymes: No results for input(s): "CKTOTAL", "CKMB", "CKMBINDEX", "TROPONINI" in the last 168 hours.  HbA1C: Hgb A1c MFr Bld  Date/Time Value Ref Range Status  05/22/2021 04:20 PM 7.7 (H) 4.8 - 5.6 % Final    Comment:    (NOTE) Pre diabetes:          5.7%-6.4%  Diabetes:              >6.4%  Glycemic control for   <7.0% adults with diabetes   04/14/2018 09:55 AM 7.9 (H) 4.8 - 5.6 % Final    Comment:    (NOTE) Pre diabetes:          5.7%-6.4% Diabetes:              >6.4% Glycemic control for   <7.0% adults with diabetes     CBG: Recent Labs  Lab 08/20/2021 2052 08/15/2021 0601 08/15/2021 0803  GLUCAP 123* 164* 142*    Review of Systems:     Past Medical History:  He,  has a past medical history of A-fib ([place]), Cancer ([place]), Diabetes mellitus without complication ([place]), Encounter for loop recorder check (09/15/2018), H/O: stroke (04/14/2018), Hyperlipemia (06/13/2018), Hypertension, Hypothyroidism (06/13/2018), 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   Surgical History:    Past Surgical History:  Procedure Laterality Date   BACK SURGERY     LOOP RECORDER IMPLANT       Social History:   reports that he has never smoked. He has never used smokeless tobacco. He reports current alcohol use. He reports that he does not use drugs.   Family History:  His family history includes Aneurysm in his father; Cancer in his brother; Stroke in his mother.   Allergies Allergies  Allergen Reactions   Penicillins Rash    Did it involve swelling of the face/tongue/throat, SOB, or low BP? Yes Did it involve sudden or severe rash/hives, skin peeling, or any reaction on the inside of your mouth or nose? No Did you need to seek medical attention at a hospital or doctor's office? No When did it last happen?       If all above answers are "NO", may proceed with cephalosporin use.        Home Medications  Prior to Admission medications   Medication Sig Start Date End Date Taking? Authorizing Provider  allopurinol (ZYLOPRIM) 300 MG tablet Take 300 mg by mouth daily.   Yes [provider]  atorvastatin (LIPITOR) 40 MG tablet Take 1 tablet (40 mg total) by mouth daily. 06/09/21  Yes Love, Ivan Anchors, PA-C  clopidogrel (PLAVIX) 75 MG tablet Take 1 tablet (75 mg total) by mouth daily. 06/09/21  Yes Love, Ivan Anchors, PA-C  diclofenac Sodium (VOLTAREN) 1 % GEL Apply 2 g topically 4 (four) times daily. 06/08/21  Yes Love, Ivan Anchors, PA-C  docusate sodium (COLACE) 100 MG capsule Take 100 mg by mouth daily.   Yes [provider]  doxazosin (CARDURA) 4 MG tablet Take 1 tablet (4 mg total) by mouth at bedtime. 06/08/21  Yes Love, Ivan Anchors, PA-C  empagliflozin (JARDIANCE) 10 MG TABS tablet Take 1 tablet (10 mg total) by mouth daily. 06/09/21  Yes Love, Ivan Anchors, PA-C  ezetimibe (ZETIA) 10 MG tablet Take 10 mg by mouth daily.   Yes [provider]  insulin lispro (HUMALOG) 100 UNIT/ML injection Inject 100 Units into the skin in the morning and at bedtime.   Yes  [provider]  levothyroxine (SYNTHROID) 88 MCG tablet Take 88 mcg by mouth daily before breakfast.   Yes [provider]  losartan (COZAAR) 50 MG tablet Take 75 mg by mouth daily. 07/06/21  Yes [provider]  megestrol (MEGACE) 400 MG/10ML suspension Take 5 mLs (200 mg total) by mouth 2 (two) times daily. 06/08/21  Yes Love, Ivan Anchors, PA-C  melatonin 5 MG TABS Take 5 mg by mouth at bedtime.   Yes [provider]  Multiple Vitamin (MULTIVITAMIN WITH MINERALS) TABS tablet Take 1 tablet by mouth daily. 06/09/21  Yes Love, Ivan Anchors, PA-C  nutrition supplement, JUVEN, (JUVEN) PACK Take 1 packet by mouth once.   Yes [provider]  nystatin (MYCOSTATIN/NYSTOP) powder Apply topically 2 (two) times daily. 06/08/21  Yes Love, Ivan Anchors, PA-C  ondansetron (ZOFRAN-ODT) 4 MG disintegrating tablet Take 4 mg by mouth every 8 (eight) hours as needed for nausea or vomiting.   Yes [provider]  pioglitazone (ACTOS) 30 MG tablet Take 1 tablet (30 mg total) by mouth daily. 06/09/21  Yes Love, Ivan Anchors, PA-C  polyethylene glycol (MIRALAX / GLYCOLAX) 17 g packet Take 17 g by mouth 2 (two) times daily. 06/08/21  Yes Love, Ivan Anchors, PA-C  sertraline (ZOLOFT) 25 MG tablet Take 1 tablet (25 mg total) by mouth daily. 06/09/21  Yes Love, Ivan Anchors, PA-C  sodium chloride (OCEAN) 0.65 % SOLN nasal spray Place 1 spray into both nostrils 2 (two) times daily.   Yes [provider]  melatonin 300 MCG TABS Take 5 tablets (1,500 mcg total) by mouth at bedtime. Patient not taking: Reported on 07/28/2021 06/08/21   Love, Ivan Anchors, PA-C  methylphenidate (RITALIN) 5 MG tablet Take 1 tablet (5 mg total) by mouth 2 (two) times daily with breakfast and lunch. Patient not taking: Reported on 08/15/2021 06/09/21   Love, Ivan Anchors, PA-C     Signed,  Baldwin [place], MS4

## 2021-08-25 NOTE — Procedures (Signed)
Intubation Procedure Note  Alan Rubio  [id number]  02-17-1931  Date:08-15-2021  Time:7:02 AM   Provider Performing:Lidiya Reise W [place]    Procedure: Intubation (12878)  Indication(s) Respiratory Failure  Consent Unable to obtain consent due to emergent nature of procedure.   Anesthesia None required   Time Out Verified patient identification, verified procedure, site/side was marked, verified correct patient position, special equipment/implants available, medications/allergies/relevant history reviewed, required imaging and test results available.   Sterile Technique Usual hand hygeine, masks, and gloves were used   Procedure Description Patient positioned in bed supine.  Sedation given as noted above.  Patient was intubated with endotracheal tube using Glidescope.  View was Grade 1 full glottis .  Number of attempts was 1.  Colorimetric CO2 detector was consistent with tracheal placement.   Complications/Tolerance None; patient tolerated the procedure well. Chest X-ray is ordered to verify placement.   EBL Minimal   Specimen(s) None    Georgann Housekeeper, AGACNP-BC [facility]  See Amion for personal pager PCCM on call pager [phone] until 7pm. Please call Elink 7p-7a. [phone]  08/15/2021 7:02 AM

## 2021-08-25 NOTE — Progress Notes (Signed)
RT note-Patient expired and ventilator discontinued at 1001

## 2021-08-25 NOTE — Progress Notes (Signed)
On exam the patient did not respond to verbal or physical stimuli. Absent heart sounds. Ventilated breath sounds. Absent peripheral pulses. Pupils are fixed and dilated. Patient pronounced dead at 10:01am. Family were at bedside. Chaplan services offered. Not a medical examiner case.   Freda Jackson, MD [facility] Office: [phone]   See Amion for personal pager PCCM on call pager [phone] until 7pm. Please call Elink 7p-7a. [phone]

## 2021-08-25 NOTE — Progress Notes (Signed)
eLink Physician-Brief Progress Note Patient Name: Alan Rubio DOB: 10-13-1931 MRN: [id number]   Date of Service  11-Aug-2021  HPI/Events of Note  Chest pain related to CPR.   eICU Interventions  Plan: Fentanyl 25 mcg IV X 1 now.      Intervention Category Major Interventions: Other:  Rolinda Impson Cornelia Copa 08-11-2021, 5:00 AM

## 2021-08-25 NOTE — Progress Notes (Signed)
Patient expired at 1001.  Family at bedside.  Pronounced by Burna Mortimer, RN and Deboraha Sprang, RN.  Patient belongings giving to family.  Body transported to morgue.

## 2021-08-25 DEATH — deceased

## 2021-11-18 ENCOUNTER — Ambulatory Visit: Payer: Medicare Other | Admitting: Adult Health
